# Patient Record
Sex: Female | Born: 1960
Health system: Southern US, Community
[De-identification: ages and names within clinical notes are randomized; demographics above are authoritative.]

## PROBLEM LIST (undated history)

## (undated) DIAGNOSIS — C801 Malignant (primary) neoplasm, unspecified: Secondary | ICD-10-CM

## (undated) DIAGNOSIS — I10 Essential (primary) hypertension: Secondary | ICD-10-CM

## (undated) DIAGNOSIS — T4145XA Adverse effect of unspecified anesthetic, initial encounter: Secondary | ICD-10-CM

## (undated) DIAGNOSIS — R112 Nausea with vomiting, unspecified: Secondary | ICD-10-CM

## (undated) DIAGNOSIS — Z86010 Personal history of colonic polyps: Secondary | ICD-10-CM

## (undated) DIAGNOSIS — E785 Hyperlipidemia, unspecified: Secondary | ICD-10-CM

## (undated) DIAGNOSIS — T7840XA Allergy, unspecified, initial encounter: Secondary | ICD-10-CM

## (undated) DIAGNOSIS — K589 Irritable bowel syndrome without diarrhea: Secondary | ICD-10-CM

## (undated) DIAGNOSIS — F419 Anxiety disorder, unspecified: Secondary | ICD-10-CM

## (undated) DIAGNOSIS — K219 Gastro-esophageal reflux disease without esophagitis: Secondary | ICD-10-CM

## (undated) DIAGNOSIS — C50919 Malignant neoplasm of unspecified site of unspecified female breast: Secondary | ICD-10-CM

## (undated) HISTORY — PX: WISDOM TOOTH EXTRACTION: SHX21

## (undated) HISTORY — DX: Malignant neoplasm of unspecified site of unspecified female breast: C50.919

## (undated) HISTORY — PX: COLONOSCOPY: SHX174

## (undated) HISTORY — DX: Essential (primary) hypertension: I10

## (undated) HISTORY — DX: Irritable bowel syndrome, unspecified: K58.9

## (undated) HISTORY — PX: OTHER SURGICAL HISTORY: SHX169

## (undated) HISTORY — DX: Personal history of colonic polyps: Z86.010

## (undated) HISTORY — DX: Allergy, unspecified, initial encounter: T78.40XA

## (undated) HISTORY — DX: Hyperlipidemia, unspecified: E78.5

## (undated) HISTORY — PX: DIAGNOSTIC LAPAROSCOPY: SUR761

## (undated) HISTORY — PX: BREAST LUMPECTOMY: SHX2

## (undated) HISTORY — DX: Malignant (primary) neoplasm, unspecified: C80.1

## (undated) HISTORY — PX: POLYPECTOMY: SHX149

---
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
## 1984-07-14 HISTORY — PX: BUNIONECTOMY: SHX129

## 1996-07-14 HISTORY — PX: OTHER SURGICAL HISTORY: SHX169

## 1998-07-14 HISTORY — PX: BIOPSY BREAST: PRO8

## 1999-04-15 ENCOUNTER — Ambulatory Visit (HOSPITAL_BASED_OUTPATIENT_CLINIC_OR_DEPARTMENT_OTHER): Admission: RE | Admit: 1999-04-15 | Discharge: 1999-04-15 | Payer: Self-pay | Admitting: *Deleted

## 1999-10-30 ENCOUNTER — Other Ambulatory Visit: Admission: RE | Admit: 1999-10-30 | Discharge: 1999-10-30 | Payer: Self-pay | Admitting: Obstetrics and Gynecology

## 2000-12-28 ENCOUNTER — Other Ambulatory Visit: Admission: RE | Admit: 2000-12-28 | Discharge: 2000-12-28 | Payer: Self-pay | Admitting: Obstetrics and Gynecology

## 2002-02-02 ENCOUNTER — Other Ambulatory Visit: Admission: RE | Admit: 2002-02-02 | Discharge: 2002-02-02 | Payer: Self-pay | Admitting: Obstetrics and Gynecology

## 2003-02-21 ENCOUNTER — Other Ambulatory Visit: Admission: RE | Admit: 2003-02-21 | Discharge: 2003-02-21 | Payer: Self-pay | Admitting: Obstetrics and Gynecology

## 2003-05-17 ENCOUNTER — Ambulatory Visit (HOSPITAL_COMMUNITY): Admission: RE | Admit: 2003-05-17 | Discharge: 2003-05-17 | Payer: Self-pay | Admitting: Family Medicine

## 2003-05-24 ENCOUNTER — Encounter: Admission: RE | Admit: 2003-05-24 | Discharge: 2003-05-24 | Payer: Self-pay | Admitting: Family Medicine

## 2003-07-15 HISTORY — PX: PARATHYROIDECTOMY: SHX19

## 2004-04-05 ENCOUNTER — Other Ambulatory Visit: Admission: RE | Admit: 2004-04-05 | Discharge: 2004-04-05 | Payer: Self-pay | Admitting: Obstetrics and Gynecology

## 2005-04-28 ENCOUNTER — Other Ambulatory Visit: Admission: RE | Admit: 2005-04-28 | Discharge: 2005-04-28 | Payer: Self-pay | Admitting: Obstetrics and Gynecology

## 2005-11-13 ENCOUNTER — Ambulatory Visit: Payer: Self-pay | Admitting: Internal Medicine

## 2005-11-17 ENCOUNTER — Ambulatory Visit: Payer: Self-pay | Admitting: Internal Medicine

## 2005-11-17 ENCOUNTER — Encounter (INDEPENDENT_AMBULATORY_CARE_PROVIDER_SITE_OTHER): Payer: Self-pay | Admitting: *Deleted

## 2005-11-17 DIAGNOSIS — Z860100 Personal history of colon polyps, unspecified: Secondary | ICD-10-CM | POA: Insufficient documentation

## 2005-11-17 HISTORY — DX: Personal history of colonic polyps: Z86.010

## 2005-11-17 HISTORY — DX: Personal history of colon polyps, unspecified: Z86.0100

## 2009-11-19 ENCOUNTER — Ambulatory Visit (HOSPITAL_BASED_OUTPATIENT_CLINIC_OR_DEPARTMENT_OTHER): Admission: RE | Admit: 2009-11-19 | Discharge: 2009-11-19 | Payer: Self-pay | Admitting: General Surgery

## 2010-07-14 HISTORY — PX: BREAST BIOPSY: SHX20

## 2010-10-01 LAB — DIFFERENTIAL
Basophils Absolute: 0.1 10*3/uL (ref 0.0–0.1)
Basophils Relative: 1 % (ref 0–1)
Eosinophils Absolute: 0.2 10*3/uL (ref 0.0–0.7)
Eosinophils Relative: 2 % (ref 0–5)
Lymphocytes Relative: 24 % (ref 12–46)
Lymphs Abs: 1.9 10*3/uL (ref 0.7–4.0)
Monocytes Absolute: 0.5 10*3/uL (ref 0.1–1.0)
Monocytes Relative: 6 % (ref 3–12)
Neutro Abs: 5.5 10*3/uL (ref 1.7–7.7)
Neutrophils Relative %: 68 % (ref 43–77)

## 2010-10-01 LAB — CBC
HCT: 41.3 % (ref 36.0–46.0)
Hemoglobin: 14.4 g/dL (ref 12.0–15.0)
MCHC: 34.8 g/dL (ref 30.0–36.0)
MCV: 87.6 fL (ref 78.0–100.0)
Platelets: 232 10*3/uL (ref 150–400)
RBC: 4.71 MIL/uL (ref 3.87–5.11)
RDW: 12.9 % (ref 11.5–15.5)
WBC: 8.2 10*3/uL (ref 4.0–10.5)

## 2010-10-01 LAB — BASIC METABOLIC PANEL
BUN: 13 mg/dL (ref 6–23)
CO2: 30 mEq/L (ref 19–32)
Calcium: 9.7 mg/dL (ref 8.4–10.5)
Chloride: 97 mEq/L (ref 96–112)
Creatinine, Ser: 0.73 mg/dL (ref 0.4–1.2)
GFR calc Af Amer: >60 mL/min (ref >60)
GFR calc non Af Amer: >60 mL/min (ref >60)
Glucose, Bld: 140 mg/dL — ABNORMAL HIGH (ref 70–99)
Potassium: 3.3 mEq/L — ABNORMAL LOW (ref 3.5–5.1)
Sodium: 134 mEq/L — ABNORMAL LOW (ref 135–145)

## 2010-11-11 ENCOUNTER — Encounter (INDEPENDENT_AMBULATORY_CARE_PROVIDER_SITE_OTHER): Payer: Self-pay | Admitting: General Surgery

## 2011-06-03 ENCOUNTER — Encounter (INDEPENDENT_AMBULATORY_CARE_PROVIDER_SITE_OTHER): Payer: Self-pay | Admitting: General Surgery

## 2011-06-04 ENCOUNTER — Encounter (INDEPENDENT_AMBULATORY_CARE_PROVIDER_SITE_OTHER): Payer: Self-pay | Admitting: General Surgery

## 2011-07-04 ENCOUNTER — Ambulatory Visit (INDEPENDENT_AMBULATORY_CARE_PROVIDER_SITE_OTHER): Payer: BC Managed Care – PPO | Admitting: General Surgery

## 2011-07-04 ENCOUNTER — Encounter (INDEPENDENT_AMBULATORY_CARE_PROVIDER_SITE_OTHER): Payer: Self-pay | Admitting: General Surgery

## 2011-07-04 VITALS — BP 126/80 | HR 60 | Temp 97.3°F | Resp 18 | Ht 66.0 in | Wt 169.8 lb

## 2011-07-04 DIAGNOSIS — N6019 Diffuse cystic mastopathy of unspecified breast: Secondary | ICD-10-CM

## 2011-07-04 DIAGNOSIS — N6459 Other signs and symptoms in breast: Secondary | ICD-10-CM

## 2011-07-04 DIAGNOSIS — N62 Hypertrophy of breast: Secondary | ICD-10-CM

## 2011-07-04 DIAGNOSIS — N6099 Unspecified benign mammary dysplasia of unspecified breast: Secondary | ICD-10-CM

## 2011-07-04 NOTE — Progress Notes (Signed)
Patient ID: Christine Francis, female   DOB: 1961/04/25, 50 y.o.   MRN: [id number] BP 126/80  Pulse 60  Temp(Src) 97.3 F (36.3 C) (Temporal)  Resp 18  Ht 5\' 6"  (1.676 m)  Wt 169 lb 12.8 oz (77.021 kg)  BMI 27.41 kg/m2 Ms. Agan returns now proximally 7 months following her biopsy that was described as atypical ductal hyperplasia and a radial scar lobular carcinoma in situ not actually fragment malignancy and she had been on estrogen replacement for many years this all occurred in an area close to where Dr. Luberta Robertson had removed some fibrocystic changes 10 years earlier and I recommended that one week death was stopped the estrogen replacement and just follow her closely. She has had a followup mammogram [facility] and read by Dr. Yolanda Bonine and no areas of questionable findings were noted and on physical examination today she has an area of absence of breast tissue in the right breast upper area were both of these biopsies have been performed but no worrisome findings clinically or radiologically she said that would stop in the estrogen replacement she's not had any hot flashes still having periods are irregular basis and is doing nicely. She is followed by Dr. Marcelle Overlie for her GYN care and if any areas occur clinically or radiologically I would recommend a she will return to see Dr. Dwain Sarna. The [facility] was offered earlier and she didnot want to persue it. The right breast at present he has a normal exam with the absence of breast tissue under the 2 continuous incisions were the 2 previous biopsies have been performed but no other masses tenderness or areas of concern one in the right breast tissue or left breast fissure. There is no lymph nodes palpable in E. the axilla and her recent mammogram [facility] is BI-RADS 2 and a repeat mammogram has been recommended in one year

## 2011-07-04 NOTE — Patient Instructions (Signed)
Self breast examinations monthly and followup with Dr. Marcelle Overlie as he had scheduled. Yearly mammograms and she noticed a change in only her breast examination call to be seen by Dr. Marcelle Overlie one of my partners.

## 2011-12-02 ENCOUNTER — Other Ambulatory Visit: Payer: Self-pay | Admitting: Obstetrics and Gynecology

## 2012-10-18 ENCOUNTER — Encounter: Payer: Self-pay | Admitting: Internal Medicine

## 2012-11-15 ENCOUNTER — Encounter: Payer: Self-pay | Admitting: Internal Medicine

## 2012-11-15 ENCOUNTER — Ambulatory Visit (AMBULATORY_SURGERY_CENTER): Payer: BC Managed Care – PPO | Admitting: *Deleted

## 2012-11-15 VITALS — Ht 66.0 in | Wt 164.8 lb

## 2012-11-15 DIAGNOSIS — Z1211 Encounter for screening for malignant neoplasm of colon: Secondary | ICD-10-CM

## 2012-11-15 MED ORDER — NA SULFATE-K SULFATE-MG SULF 17.5-3.13-1.6 GM/177ML PO SOLN: ORAL | Status: DC

## 2012-11-29 ENCOUNTER — Encounter: Payer: Self-pay | Admitting: Internal Medicine

## 2012-11-29 ENCOUNTER — Ambulatory Visit (AMBULATORY_SURGERY_CENTER): Payer: BC Managed Care – PPO | Admitting: Internal Medicine

## 2012-11-29 VITALS — BP 105/59 | HR 56 | Temp 98.6°F | Resp 20 | Ht 66.0 in | Wt 164.0 lb

## 2012-11-29 DIAGNOSIS — D126 Benign neoplasm of colon, unspecified: Secondary | ICD-10-CM

## 2012-11-29 DIAGNOSIS — Z1211 Encounter for screening for malignant neoplasm of colon: Secondary | ICD-10-CM

## 2012-11-29 DIAGNOSIS — Z860100 Personal history of colon polyps, unspecified: Secondary | ICD-10-CM

## 2012-11-29 MED ORDER — SODIUM CHLORIDE 0.9 % IV SOLN: 500.0000 mL | INTRAVENOUS | Status: DC

## 2012-11-29 NOTE — Progress Notes (Signed)
NO EGG OR SOY ALLERGY. EWM 

## 2012-11-29 NOTE — Patient Instructions (Addendum)
YOU HAD AN ENDOSCOPIC PROCEDURE TODAY AT THE [facility]: Refer to the procedure report that was given to you for any specific questions about what was found during the examination.  If the procedure report does not answer your questions, please call your gastroenterologist to clarify.  If you requested that your care partner not be given the details of your procedure findings, then the procedure report has been included in a sealed envelope for you to review at your convenience later.  YOU SHOULD EXPECT: Some feelings of bloating in the abdomen. Passage of more gas than usual.  Walking can help get rid of the air that was put into your GI tract during the procedure and reduce the bloating. If you had a lower endoscopy (such as a colonoscopy or flexible sigmoidoscopy) you may notice spotting of blood in your stool or on the toilet paper. If you underwent a bowel prep for your procedure, then you may not have a normal bowel movement for a few days.  DIET: Your first meal following the procedure should be a light meal and then it is ok to progress to your normal diet.  A half-sandwich or bowl of soup is an example of a good first meal.  Heavy or fried foods are harder to digest and may make you feel nauseous or bloated.  Likewise meals heavy in dairy and vegetables can cause extra gas to form and this can also increase the bloating.  Drink plenty of fluids but you should avoid alcoholic beverages for 24 hours.  ACTIVITY: Your care partner should take you home directly after the procedure.  You should plan to take it easy, moving slowly for the rest of the day.  You can resume normal activity the day after the procedure however you should NOT DRIVE or use heavy machinery for 24 hours (because of the sedation medicines used during the test).    SYMPTOMS TO REPORT IMMEDIATELY: A gastroenterologist can be reached at any hour.  During normal business hours, 8:30 AM to 5:00 PM Monday through Friday,  call [phone].  After hours and on weekends, please call the GI answering service at [phone] who will take a message and have the physician on call contact you.   Following lower endoscopy (colonoscopy or flexible sigmoidoscopy):  Excessive amounts of blood in the stool  Significant tenderness or worsening of abdominal pains  Swelling of the abdomen that is new, acute  Fever of 100F or higher  FOLLOW UP: If any biopsies were taken you will be contacted by phone or by letter within the next 1-3 weeks.  Call your gastroenterologist if you have not heard about the biopsies in 3 weeks.  Our staff will call the home number listed on your records the next business day following your procedure to check on you and address any questions or concerns that you may have at that time regarding the information given to you following your procedure. This is a courtesy call and so if there is no answer at the home number and we have not heard from you through the emergency physician on call, we will assume that you have returned to your regular daily activities without incident.  SIGNATURES/CONFIDENTIALITY: You and/or your care partner have signed paperwork which will be entered into your electronic medical record.  These signatures attest to the fact that that the information above on your After Visit Summary has been reviewed and is understood.  Full responsibility of the confidentiality of this   discharge information lies with you and/or your care-partner.  Polyps-handout given  Repeat colonoscopy will be determined by pathology   

## 2012-11-29 NOTE — Progress Notes (Signed)
Called to room to assist during endoscopic procedure.  Patient ID and intended procedure confirmed with present staff. Received instructions for my participation in the procedure from the performing physician.  

## 2012-11-29 NOTE — Progress Notes (Signed)
Patient did not experience any of the following events: a burn prior to discharge; a fall within the facility; wrong site/side/patient/procedure/implant event; or a hospital transfer or hospital admission upon discharge from the facility. (G8907) Patient did not have preoperative order for IV antibiotic SSI prophylaxis. (G8918)  

## 2012-11-29 NOTE — Op Note (Signed)
[facility] [address] [place], [address]   COLONOSCOPY PROCEDURE REPORT  PATIENT: Christine Francis, Christine Francis  MR#: [id number] BIRTHDATE: [birth year] , 51  yrs. old GENDER: Female ENDOSCOPIST: Iva Boop, MD, [facility] PROCEDURE DATE:  11/29/2012 PROCEDURE:   Colonoscopy with snare polypectomy ASA CLASS:   Class II INDICATIONS:Screening and surveillance,personal history of colonic polyps.   Last colonoscopy 2007 MEDICATIONS: propofol (Diprivan) 400mg  IV, MAC sedation, administered by CRNA, and These medications were titrated to patient response per physician's verbal order  DESCRIPTION OF PROCEDURE:   After the risks benefits and alternatives of the procedure were thoroughly explained, informed consent was obtained.  A digital rectal exam revealed no abnormalities of the rectum.   The LB [id number]  endoscope was introduced through the anus and advanced to the cecum, which was identified by both the appendix and ileocecal valve. No adverse events experienced.   The quality of the prep was excellent using Suprep  The instrument was then slowly withdrawn as the colon was fully examined.      COLON FINDINGS: Three diminutive sessile polyps were found in the ascending colon and transverse colon.  A polypectomy was performed with a cold snare.  The resection was complete and the polyp tissue was completely retrieved.   The colon mucosa was otherwise normal. A right colon retroflexion was performed.  Retroflexed views revealed no abnormalities. The time to cecum=2 minutes 37 seconds. Withdrawal time=10 minutes 03 seconds.  The scope was withdrawn and the procedure completed. COMPLICATIONS: There were no complications.  ENDOSCOPIC IMPRESSION: 1.   Three diminutive sessile polyps were found in the ascending colon and transverse colon; polypectomy was performed with a cold snare 2.   The colon mucosa was otherwise normal - excellent prep  RECOMMENDATIONS: 1.   Timing of repeat colonoscopy will be determined by pathology findings. 2.   In patient with 3 adenomas max 12 mm TV adenoma removed 2007   eSigned:  Iva Boop, MD, [facility] 11/29/2012 10:42 AM  cc: [facility] and The Patient

## 2012-11-30 ENCOUNTER — Telehealth: Payer: Self-pay | Admitting: *Deleted

## 2012-11-30 NOTE — Telephone Encounter (Signed)
  Follow up Call-  Call back number 11/29/2012  Post procedure Call Back phone  # [phone]  Permission to leave phone message Yes     Patient questions:  Do you have a fever, pain , or abdominal swelling? no Pain Score  0 *  Have you tolerated food without any problems? yes  Have you been able to return to your normal activities? yes  Do you have any questions about your discharge instructions: Diet   no Medications  no Follow up visit  no  Do you have questions or concerns about your Care? no  Actions: * If pain score is 4 or above: No action needed, pain <4.

## 2012-12-02 ENCOUNTER — Encounter: Payer: Self-pay | Admitting: Internal Medicine

## 2012-12-02 NOTE — Progress Notes (Signed)
Quick Note:  3 diminutive adenomas Repeat colonoscopy about 11/2015 ______

## 2013-11-29 ENCOUNTER — Other Ambulatory Visit: Payer: Self-pay | Admitting: Obstetrics and Gynecology

## 2013-12-12 HISTORY — PX: OTHER SURGICAL HISTORY: SHX169

## 2014-05-22 ENCOUNTER — Other Ambulatory Visit: Payer: Self-pay | Admitting: *Deleted

## 2014-05-22 ENCOUNTER — Ambulatory Visit
Admission: RE | Admit: 2014-05-22 | Discharge: 2014-05-22 | Disposition: A | Discharge status: Home / Self Care | Payer: BC Managed Care – PPO | Source: Ambulatory Visit | Attending: *Deleted | Admitting: *Deleted

## 2014-05-22 DIAGNOSIS — W19XXXA Unspecified fall, initial encounter: Secondary | ICD-10-CM

## 2014-06-20 ENCOUNTER — Other Ambulatory Visit: Payer: Self-pay | Admitting: Obstetrics and Gynecology

## 2014-06-21 LAB — CYTOLOGY - PAP

## 2014-07-19 ENCOUNTER — Other Ambulatory Visit: Payer: Self-pay | Admitting: Radiology

## 2014-07-24 ENCOUNTER — Other Ambulatory Visit (INDEPENDENT_AMBULATORY_CARE_PROVIDER_SITE_OTHER): Payer: Self-pay | Admitting: Surgery

## 2014-07-24 DIAGNOSIS — D0501 Lobular carcinoma in situ of right breast: Secondary | ICD-10-CM

## 2014-08-03 ENCOUNTER — Ambulatory Visit
Admission: RE | Admit: 2014-08-03 | Discharge: 2014-08-03 | Disposition: A | Discharge status: Home / Self Care | Payer: BLUE CROSS/BLUE SHIELD | Source: Ambulatory Visit | Attending: Surgery | Admitting: Surgery

## 2014-08-03 DIAGNOSIS — D0501 Lobular carcinoma in situ of right breast: Secondary | ICD-10-CM

## 2014-08-04 ENCOUNTER — Inpatient Hospital Stay: Admission: RE | Admit: 2014-08-04 | Payer: Self-pay | Source: Ambulatory Visit

## 2014-08-06 ENCOUNTER — Other Ambulatory Visit: Payer: Self-pay

## 2014-08-09 ENCOUNTER — Other Ambulatory Visit (INDEPENDENT_AMBULATORY_CARE_PROVIDER_SITE_OTHER): Payer: Self-pay | Admitting: Surgery

## 2014-08-09 DIAGNOSIS — D0501 Lobular carcinoma in situ of right breast: Secondary | ICD-10-CM

## 2014-08-14 ENCOUNTER — Telehealth: Payer: Self-pay | Admitting: *Deleted

## 2014-08-14 NOTE — Telephone Encounter (Signed)
Called & left a message for the pt to return my call so I can schedule her for a high risk appt.

## 2014-08-17 ENCOUNTER — Telehealth: Payer: Self-pay | Admitting: *Deleted

## 2014-08-17 NOTE — Telephone Encounter (Signed)
Pt returned my call and I confirmed 09/04/14 high risk appt w/ pt.  Mailed calendar, welcoming packet & intake form to pt.  Emailed Engineer, civil (consulting) at [employer] to make her aware.

## 2014-09-04 ENCOUNTER — Ambulatory Visit (HOSPITAL_BASED_OUTPATIENT_CLINIC_OR_DEPARTMENT_OTHER): Payer: BLUE CROSS/BLUE SHIELD | Admitting: Hematology and Oncology

## 2014-09-04 ENCOUNTER — Encounter: Payer: Self-pay | Admitting: Hematology and Oncology

## 2014-09-04 ENCOUNTER — Encounter (INDEPENDENT_AMBULATORY_CARE_PROVIDER_SITE_OTHER): Payer: Self-pay

## 2014-09-04 ENCOUNTER — Ambulatory Visit (HOSPITAL_BASED_OUTPATIENT_CLINIC_OR_DEPARTMENT_OTHER): Payer: BLUE CROSS/BLUE SHIELD

## 2014-09-04 VITALS — BP 116/61 | HR 57 | Temp 98.2°F | Resp 18 | Ht 66.0 in

## 2014-09-04 DIAGNOSIS — D0501 Lobular carcinoma in situ of right breast: Secondary | ICD-10-CM

## 2014-09-04 DIAGNOSIS — C50412 Malignant neoplasm of upper-outer quadrant of left female breast: Secondary | ICD-10-CM | POA: Insufficient documentation

## 2014-09-04 NOTE — Assessment & Plan Note (Addendum)
Right breast LCIS along with fibrocystic and columnar cell changes status post right breast biopsy 07/19/2014  Pathology counseling: I discussed with her the difference between LCIS and invasive cancer. LCIS is a risk factor for breast cancer and not a premalignant condition. Hence it does not need to be resected. However she is at higher than normal risk of breast cancer. Risk of invasive and noninvasive breast cancer with LCIS is 1% per year. Her cumulative risk lifetime would be around 40%. Tamoxifen would reduce this risk by half. This is based on NSABP P-1 clinical trial  Risk reduction strategies: 1. Tamoxifen or raloxifene are indicated to decrease the risk of another noninvasive or invasive breast cancer. Patient fully understands that neither of these medications would prolong her life but certainly help decrease relapse or recurrence rate by 50% . 2. Recommended annual mammograms and breast exams.   Tamoxifen toxicities:We discussed the risks and benefits of tamoxifen. These include but not limited to insomnia, hot flashes, mood changes, vaginal dryness, and weight gain. Although rare, serious side effects including endometrial cancer, risk of blood clots were also discussed. We strongly believe that the benefits far outweigh the risks. Patient understands these risks and consented to starting treatment. Planned treatment duration is 5 years.

## 2014-09-04 NOTE — Progress Notes (Signed)
Checked in new patient with no issues prior to seeing the dr. She has appt card and has not been traveling.

## 2014-09-04 NOTE — Progress Notes (Signed)
[facility] CONSULT NOTE  Patient Care Team: Margarette Asal, MD as PCP - General (Obstetrics and Gynecology)  CHIEF COMPLAINTS/PURPOSE OF CONSULTATION:  Right breast LCIS  HISTORY OF PRESENTING ILLNESS:  Christine Francis 54 y.o. female is here because of recent diagnosis of right breast LCIS. Patient had 3 prior biopsies including a lumpectomy on 11/19/2009 which showed atypical ductal hyperplasia. Patient has had a long-standing history of oral contraceptive treatment for over 30 years most recently being used for dysfunctional uterine bleeding. After the previous breast problems, she discontinued oral contraceptive pills. She had a mammogram which revealed abnormalities that led to a biopsy on 07/19/2014 which revealed LCIS. She met with Dr. Molli Posey who referred her to [place] to discuss risk lowering strategies because she is high risk for breast cancer. She is here today accompanied by her sister who works as a Marine scientist in [place]. Patient has been in significant emotional distress ever since he was found to have this diagnosis and wanted to discuss different options for treatment including the role of prophylactic mastectomy. She was also being evaluated for atypical Pap smears which are being watched and monitored.  I reviewed her records extensively and collaborated the history with the patient.  SUMMARY OF ONCOLOGIC HISTORY:   Neoplasm of right breast, primary tumor staging category Tis: lobular carcinoma in situ (LCIS)   07/19/2014 Initial Biopsy Rt.Breast Biopsy: LCIS    In terms of breast cancer risk profile:  She menarched at early age of 84 and went to menopause at age 61  She had 0 pregnancies  She has received birth control pills for approximately 30 years.  She was never exposed to fertility medications or hormone replacement therapy.  She has no family history of Breast/GYN/GI cancer  MEDICAL HISTORY:  Past Medical History  Diagnosis Date  . Asthma   . IBS  (irritable bowel syndrome)   . Hyperlipidemia   . Hypertension   . Cancer     skin cancer  . Personal history of colonic adenomas 11/17/2005    11/17/2005 - 3 adenomas - one  A TV adenoma was 12 mm (max)    SURGICAL HISTORY: Past Surgical History  Procedure Laterality Date  . Parathyroidectomy  2005  . Biopsy breast  2000    right; benign  . Laproscopy  1998  . Bunionectomy  1986    bilateral  . Breast biopsy  2012    right breast; pre cancerous  . Colonoscopy    . Cervical cryosurgery  12/2013  . Breast lumpectomy  04/1999, 11/2009    SOCIAL HISTORY: History   Social History  . Marital Status: Single    Spouse Name: N/A  . Number of Children: N/A  . Years of Education: N/A   Occupational History  . Loss adjuster, chartered  .  [employer]   Social History Main Topics  . Smoking status: Never Smoker   . Smokeless tobacco: Never Used  . Alcohol Use: 0.6 oz/week    1 Glasses of wine per week  . Drug Use: No  . Sexual Activity: Not on file   Other Topics Concern  . Not on file   Social History Narrative    FAMILY HISTORY: Family History  Problem Relation Age of Onset  . Hypertension Father   . Cancer Father     melanoma/skin cancer  . Cancer Sister     skin  . Colon cancer Neg Hx     ALLERGIES:  is allergic to amoxicillin;  dilaudid; erythromycin; penicillins; and sulfa drugs cross reactors.  MEDICATIONS:  Current Outpatient Prescriptions  Medication Sig Dispense Refill  . atenolol-chlorthalidone (TENORETIC) 50-25 MG per tablet Take 1 tablet by mouth daily.      Marland Kitchen CALCIUM PO Take 1,200 mg by mouth.     . Cholecalciferol (VITAMIN D-3 PO) Take 1,600 Int'l Units by mouth.     . diazepam (VALIUM) 5 MG tablet Take 5 mg by mouth every 8 (eight) hours as needed. for anxiety  0  . montelukast (SINGULAIR) 10 MG tablet Take 10 mg by mouth at bedtime.    . Omeprazole (PRILOSEC PO) Take 20 mg by mouth daily.     . Potassium Chloride Crys CR (KLOR-CON M20 PO) Take  20 mEq by mouth.      . predniSONE (DELTASONE) 10 MG tablet Take 10 mg by mouth daily.     Marland Kitchen triamcinolone cream (KENALOG) 0.1 % Apply 1 application topically 2 (two) times daily.      No current facility-administered medications for this visit.    REVIEW OF SYSTEMS:   Constitutional: Denies fevers, chills or abnormal night sweats Eyes: Denies blurriness of vision, double vision or watery eyes Ears, nose, mouth, throat, and face: Denies mucositis or sore throat Respiratory: Denies cough, dyspnea or wheezes Cardiovascular: Denies palpitation, chest discomfort or lower extremity swelling Gastrointestinal:  Denies nausea, heartburn or change in bowel habits Skin: Denies abnormal skin rashes Lymphatics: Denies new lymphadenopathy or easy bruising Neurological:Denies numbness, tingling or new weaknesses Behavioral/Psych: Mood is stable, no new changes  Breast:  Denies any palpable lumps or discharge All other systems were reviewed with the patient and are negative.  PHYSICAL EXAMINATION: ECOG PERFORMANCE STATUS: 0 - Asymptomatic  Filed Vitals:   09/04/14 1213  BP: 116/61  Pulse: 57  Temp: 98.2 F (36.8 C)  Resp: 18   Filed Weights    GENERAL:alert, no distress and comfortable SKIN: skin color, texture, turgor are normal, no rashes or significant lesions EYES: normal, conjunctiva are pink and non-injected, sclera clear OROPHARYNX:no exudate, no erythema and lips, buccal mucosa, and tongue normal  NECK: supple, thyroid normal size, non-tender, without nodularity LYMPH:  no palpable lymphadenopathy in the cervical, axillary or inguinal LUNGS: clear to auscultation and percussion with normal breathing effort HEART: regular rate & rhythm and no murmurs and no lower extremity edema ABDOMEN:abdomen soft, non-tender and normal bowel sounds Musculoskeletal:no cyanosis of digits and no clubbing  PSYCH: alert & oriented x 3 with fluent speech NEURO: no focal motor/sensory  deficits  LABORATORY DATA:  I have reviewed the data as listed Lab Results  Component Value Date   WBC 8.2 11/14/2009   HGB 14.4 11/14/2009   HCT 41.3 11/14/2009   MCV 87.6 11/14/2009   PLT 232 11/14/2009   Lab Results  Component Value Date   NA 134* 11/14/2009   K 3.3* 11/14/2009   CL 97 11/14/2009   CO2 30 11/14/2009    ASSESSMENT AND PLAN:  Neoplasm of right breast, primary tumor staging category Tis: lobular carcinoma in situ (LCIS) Right breast LCIS along with fibrocystic and columnar cell changes status post right breast biopsy 07/19/2014 Patient was unable to do breast MRI because of claustrophobia.  Pathology counseling: I discussed with her the difference between LCIS and invasive cancer. LCIS is a risk factor for breast cancer and not a premalignant condition. Hence it does not need to be resected. However she is at higher than normal risk of breast cancer. Risk of invasive and  noninvasive breast cancer with LCIS is 1% per year. Her cumulative risk lifetime would be around 40%. Tamoxifen would reduce this risk by half. This is based on NSABP P-1 clinical trial  Risk reduction strategies: 1. Tamoxifen or raloxifene are indicated to decrease the risk of another noninvasive or invasive breast cancer. Patient fully understands that neither of these medications would prolong her life but certainly help decrease relapse or recurrence rate by 50% . 2. Recommended annual mammograms and breast exams.   Tamoxifen toxicities:We discussed the risks and benefits of tamoxifen. These include but not limited to insomnia, hot flashes, mood changes, vaginal dryness, and weight gain. Although rare, serious side effects including endometrial cancer, risk of blood clots were also discussed. We strongly believe that the benefits far outweigh the risks. Patient understands these risks and consented to starting treatment. Planned treatment duration is 5 years.  Patient is evaluating different  options and will call us with her final decision. She will discuss with Dr.Tsui regarding the role of prophylactic bilateral mastectomies. I did not think it was necessary but the patient believes that it could help her emotional state. She will be discussing the role of lumpectomy as well. Since she could not do the breast MRI, it is difficult to tell if there is still additional underlying breast abnormality. I will leave the decision regarding surgery to the patient and Dr.Tsui.  Patient will call us back if she wants to get started on tamoxifen therapy. If she does start tamoxifen I would like to see her back in 3 months.     All questions were answered. The patient knows to call the clinic with any problems, questions or concerns.    Rulon Eisenmenger, MD 1:36 PM

## 2014-09-04 NOTE — Progress Notes (Signed)
New patient intake form - chart updated.  Sent to scan.

## 2014-10-23 ENCOUNTER — Ambulatory Visit: Payer: Self-pay | Admitting: Surgery

## 2014-10-23 DIAGNOSIS — D0501 Lobular carcinoma in situ of right breast: Secondary | ICD-10-CM

## 2014-10-23 NOTE — H&P (Signed)
  History of Present Illness Christine Francis. [place] Souter MD; 10/23/2014 1:33 PM) Patient words: discuss lumpectomy.  The patient is a 54 year old female who presents with a complaint of Breast problems. This is a 54 yo female who presents after excision of a fibroadenoma in the upper outer quadrant of her right breast by Dr. Truitt Leep. In 2011, she underwent further surgery with a needle-localized lumpectomy in the right upper outer quadrant by Dr. Rise Patience. She has a large incision with some underlying tissue loss in the RUOQ. The lumpectomy pathology report showed atypical ductal hypdrplasia in a radial scar, lobular carcinoma in situ, and flat epithelial atypia.  On 06/28/14, she underwent routine screening mammogram. She had been followed closely with q6 month mammograms and this had been her first annual mammogram in several years. She had some asymmetry in the right breast. She was recalled for diagnostic mammogram and ultrasound on 07/04/14. The ultrasound showed a 1 cm oval mass in the right breast with well-circumscribed margins. This was biopsied under ultrasound guidance on 07/19/14 at 10:00 in the right breast anterior depth. The pathology report shows lobular neoplasia (LCIS). Initially the patient was leaning towards mastectomy. However she has had consultations with Dr. Lindi Adie of Oncology and Dr. Ernst Bowler of Plastic Surgery.  The patient was unable to tolerate MRI because of extreme claustrophobia. Dr. Lindi Adie explained to the patient that LCIS is a risk factor for breast cancer and not a premalignant condition. Her cumulative lifetime risk of developing invasive cancer in either breast would be around 40%. He recommended Tamoxifen to decrease her overall risk.  After thinking this over thoroughly, she has decided on lumpectomy followed by Tamoxifen. She comes in today to discuss the surgical procedure. Allergies (Sonya Bynum, CMA; 10/23/2014 10:37 AM) Penicillins Dilaudid *ANALGESICS -  OPIOID* Sulfa Antibiotics  Medication History (Sonya Bynum, CMA; 10/23/2014 10:38 AM) Atenolol-Chlorthalidone (50-25MG  Tablet, Oral) Active. Klor-Con M20  [facility] Tablet ER, Oral) Active. Montelukast Sodium (10MG  Tablet, Oral) Active. Calcium "900" w/D (Oral) Active. Cholecalciferol Active. PriLOSEC OTC (20MG  Tablet DR, Oral) Active. Medications Reconciled    Vitals (Sonya Bynum CMA; 10/23/2014 10:37 AM) 10/23/2014 10:37 AM Weight: 182 lb Height: 66in Body Surface Area: 1.96 m Body Mass Index: 29.38 kg/m Temp.: 97.2F(Temporal)  Pulse: 75 (Regular)  BP: 118/74 (Sitting, Left Arm, Standard)     Physical Exam Rodman Key K. Britiny Defrain MD; 10/23/2014 1:33 PM)  The physical exam findings are as follows: Note:Note:WDWN in NAD HEENT: EOMI, sclera anicteric Neck: No masses, no thyromegaly Lungs: CTA bilaterally; normal respiratory effort Breasts: right breast slightly smaller than left; large transverse incision across the upper outer quadrant of the right breast Some residual bruising of the RUOQ from the biopsy. No palpable masses in either breast No axillary lymphadenopathy CV: Regular rate and rhythm; no murmurs Abd: +bowel sounds, soft, non-tender, no masses Ext: Well-perfused; no edema Skin: Warm, dry; no sign of jaundice    Assessment & Plan Rodman Key K. Corrine Tillis MD; 10/23/2014 1:33 PM)  BREAST NEOPLASM, TIS (LCIS), RIGHT (233.0  D05.01)  Current Plans Schedule for Surgery - right seed-localized lumpectomy. The surgical procedure has been discussed with the patient. Potential risks, benefits, alternative treatments, and expected outcomes have been explained. All of the patient's questions at this time have been answered. The likelihood of reaching the patient's treatment goal is good. The patient understand the proposed surgical procedure and wishes to proceed.  Christine Francis. Georgette Dover, MD, [facility]  General/ Trauma Surgery  10/23/2014 1:36  PM

## 2014-11-24 ENCOUNTER — Encounter (HOSPITAL_BASED_OUTPATIENT_CLINIC_OR_DEPARTMENT_OTHER): Payer: Self-pay | Admitting: *Deleted

## 2014-11-28 ENCOUNTER — Encounter (HOSPITAL_BASED_OUTPATIENT_CLINIC_OR_DEPARTMENT_OTHER)
Admission: RE | Admit: 2014-11-28 | Discharge: 2014-11-28 | Disposition: A | Discharge status: Home / Self Care | Payer: BLUE CROSS/BLUE SHIELD | Source: Ambulatory Visit | Attending: Surgery | Admitting: Surgery

## 2014-11-28 DIAGNOSIS — Z79899 Other long term (current) drug therapy: Secondary | ICD-10-CM | POA: Diagnosis not present

## 2014-11-28 DIAGNOSIS — J45909 Unspecified asthma, uncomplicated: Secondary | ICD-10-CM | POA: Diagnosis not present

## 2014-11-28 DIAGNOSIS — I10 Essential (primary) hypertension: Secondary | ICD-10-CM | POA: Diagnosis not present

## 2014-11-28 DIAGNOSIS — R921 Mammographic calcification found on diagnostic imaging of breast: Secondary | ICD-10-CM | POA: Diagnosis not present

## 2014-11-28 DIAGNOSIS — D241 Benign neoplasm of right breast: Secondary | ICD-10-CM | POA: Diagnosis not present

## 2014-11-28 DIAGNOSIS — K219 Gastro-esophageal reflux disease without esophagitis: Secondary | ICD-10-CM | POA: Diagnosis not present

## 2014-11-28 DIAGNOSIS — D0501 Lobular carcinoma in situ of right breast: Secondary | ICD-10-CM | POA: Diagnosis present

## 2014-11-28 DIAGNOSIS — N62 Hypertrophy of breast: Secondary | ICD-10-CM | POA: Diagnosis not present

## 2014-11-28 LAB — BASIC METABOLIC PANEL
ANION GAP: 9 (ref 5–15)
BUN: 15 mg/dL (ref 6–20)
CHLORIDE: 100 mmol/L — AB (ref 101–111)
CO2: 29 mmol/L (ref 22–32)
Calcium: 9.9 mg/dL (ref 8.9–10.3)
Creatinine, Ser: 0.79 mg/dL (ref 0.44–1.00)
GFR calc Af Amer: >60 mL/min (ref >60)
Glucose, Bld: 119 mg/dL — ABNORMAL HIGH (ref 65–99)
Potassium: 3 mmol/L — ABNORMAL LOW (ref 3.5–5.1)
SODIUM: 138 mmol/L (ref 135–145)

## 2014-11-28 NOTE — Progress Notes (Addendum)
Spoke with Amy RN at Dr. Vonna Kotyk office to notify him of K 3.0, Cl 100 and glucose 119. Dr. Georgette Dover called back and said he was ok with it if anesthesia thinks ok. Dr. Rodman Comp shown these labs - [facility] for surgery.

## 2014-11-28 NOTE — Progress Notes (Signed)
Dr. Linna Caprice reviewed EKG - [facility] for surgery

## 2014-11-29 ENCOUNTER — Ambulatory Visit (HOSPITAL_BASED_OUTPATIENT_CLINIC_OR_DEPARTMENT_OTHER)
Admission: RE | Admit: 2014-11-29 | Discharge: 2014-11-29 | Disposition: A | Discharge status: Home / Self Care | Payer: BLUE CROSS/BLUE SHIELD | Source: Ambulatory Visit | Attending: Surgery | Admitting: Surgery

## 2014-11-29 ENCOUNTER — Encounter (HOSPITAL_BASED_OUTPATIENT_CLINIC_OR_DEPARTMENT_OTHER)
Admission: RE | Disposition: A | Discharge status: Home / Self Care | Payer: Self-pay | Source: Ambulatory Visit | Attending: Surgery

## 2014-11-29 ENCOUNTER — Ambulatory Visit (HOSPITAL_BASED_OUTPATIENT_CLINIC_OR_DEPARTMENT_OTHER): Payer: BLUE CROSS/BLUE SHIELD | Admitting: Anesthesiology

## 2014-11-29 ENCOUNTER — Encounter (HOSPITAL_BASED_OUTPATIENT_CLINIC_OR_DEPARTMENT_OTHER): Payer: Self-pay

## 2014-11-29 DIAGNOSIS — I10 Essential (primary) hypertension: Secondary | ICD-10-CM | POA: Insufficient documentation

## 2014-11-29 DIAGNOSIS — K219 Gastro-esophageal reflux disease without esophagitis: Secondary | ICD-10-CM | POA: Insufficient documentation

## 2014-11-29 DIAGNOSIS — D0501 Lobular carcinoma in situ of right breast: Secondary | ICD-10-CM | POA: Insufficient documentation

## 2014-11-29 DIAGNOSIS — Z79899 Other long term (current) drug therapy: Secondary | ICD-10-CM | POA: Insufficient documentation

## 2014-11-29 DIAGNOSIS — R921 Mammographic calcification found on diagnostic imaging of breast: Secondary | ICD-10-CM | POA: Insufficient documentation

## 2014-11-29 DIAGNOSIS — D241 Benign neoplasm of right breast: Secondary | ICD-10-CM | POA: Insufficient documentation

## 2014-11-29 DIAGNOSIS — J45909 Unspecified asthma, uncomplicated: Secondary | ICD-10-CM | POA: Insufficient documentation

## 2014-11-29 DIAGNOSIS — N62 Hypertrophy of breast: Secondary | ICD-10-CM | POA: Insufficient documentation

## 2014-11-29 HISTORY — DX: Gastro-esophageal reflux disease without esophagitis: K21.9

## 2014-11-29 HISTORY — DX: Anxiety disorder, unspecified: F41.9

## 2014-11-29 HISTORY — PX: BREAST LUMPECTOMY WITH RADIOACTIVE SEED LOCALIZATION: SHX6424

## 2014-11-29 LAB — POCT HEMOGLOBIN-HEMACUE: Hemoglobin: 14.7 g/dL (ref 12.0–15.0)

## 2014-11-29 SURGERY — BREAST LUMPECTOMY WITH RADIOACTIVE SEED LOCALIZATION
Anesthesia: General | Site: Breast | Laterality: Right

## 2014-11-29 MED ORDER — FENTANYL CITRATE (PF) 100 MCG/2ML IJ SOLN
INTRAMUSCULAR | Status: DC | PRN
  Administered 2014-11-29 (×2): 25 ug via INTRAVENOUS
  Administered 2014-11-29: 50 ug via INTRAVENOUS

## 2014-11-29 MED ORDER — LACTATED RINGERS IV SOLN
INTRAVENOUS | Status: DC
  Administered 2014-11-29: 09:00:00 via INTRAVENOUS

## 2014-11-29 MED ORDER — PROPOFOL 10 MG/ML IV BOLUS
INTRAVENOUS | Status: DC | PRN
  Administered 2014-11-29: 250 mg via INTRAVENOUS

## 2014-11-29 MED ORDER — BUPIVACAINE-EPINEPHRINE 0.25% -1:200000 IJ SOLN
INTRAMUSCULAR | Status: DC | PRN
  Administered 2014-11-29: 10 mL

## 2014-11-29 MED ORDER — CEFAZOLIN SODIUM-DEXTROSE 2-3 GM-% IV SOLR
INTRAVENOUS | Status: AC
  Filled 2014-11-29: qty 50

## 2014-11-29 MED ORDER — MIDAZOLAM HCL 2 MG/2ML IJ SOLN
INTRAMUSCULAR | Status: AC
  Filled 2014-11-29: qty 2

## 2014-11-29 MED ORDER — ONDANSETRON HCL 4 MG/2ML IJ SOLN: 4.0000 mg | INTRAMUSCULAR | Status: DC | PRN

## 2014-11-29 MED ORDER — HYDROCODONE-ACETAMINOPHEN 5-325 MG PO TABS: 1.0000 | ORAL_TABLET | ORAL | Status: DC | PRN

## 2014-11-29 MED ORDER — OXYCODONE HCL 5 MG/5ML PO SOLN
5.0000 mg | Freq: Once | ORAL | Status: AC | PRN
Start: 2014-11-29 — End: 2014-11-29

## 2014-11-29 MED ORDER — FENTANYL CITRATE (PF) 100 MCG/2ML IJ SOLN
INTRAMUSCULAR | Status: AC
  Filled 2014-11-29: qty 2

## 2014-11-29 MED ORDER — FENTANYL CITRATE (PF) 100 MCG/2ML IJ SOLN
INTRAMUSCULAR | Status: AC
  Filled 2014-11-29: qty 4

## 2014-11-29 MED ORDER — BUPIVACAINE-EPINEPHRINE (PF) 0.5% -1:200000 IJ SOLN
INTRAMUSCULAR | Status: AC
  Filled 2014-11-29: qty 30

## 2014-11-29 MED ORDER — CEFAZOLIN SODIUM-DEXTROSE 2-3 GM-% IV SOLR
2.0000 g | INTRAVENOUS | Status: AC
  Administered 2014-11-29: 2 g via INTRAVENOUS

## 2014-11-29 MED ORDER — OXYCODONE HCL 5 MG PO TABS
5.0000 mg | ORAL_TABLET | Freq: Once | ORAL | Status: AC | PRN
  Administered 2014-11-29: 5 mg via ORAL

## 2014-11-29 MED ORDER — OXYCODONE HCL 5 MG PO TABS
ORAL_TABLET | ORAL | Status: AC
  Filled 2014-11-29: qty 1

## 2014-11-29 MED ORDER — CHLORHEXIDINE GLUCONATE 4 % EX LIQD: 1.0000 "application " | Freq: Once | CUTANEOUS | Status: DC

## 2014-11-29 MED ORDER — LIDOCAINE HCL (CARDIAC) 20 MG/ML IV SOLN
INTRAVENOUS | Status: DC | PRN
  Administered 2014-11-29: 80 mg via INTRAVENOUS

## 2014-11-29 MED ORDER — PROPOFOL 500 MG/50ML IV EMUL
INTRAVENOUS | Status: AC
  Filled 2014-11-29: qty 50

## 2014-11-29 MED ORDER — MORPHINE SULFATE 2 MG/ML IJ SOLN: 2.0000 mg | INTRAMUSCULAR | Status: DC | PRN

## 2014-11-29 MED ORDER — BUPIVACAINE-EPINEPHRINE (PF) 0.25% -1:200000 IJ SOLN
INTRAMUSCULAR | Status: AC
  Filled 2014-11-29: qty 60

## 2014-11-29 MED ORDER — GLYCOPYRROLATE 0.2 MG/ML IJ SOLN: 0.2000 mg | Freq: Once | INTRAMUSCULAR | Status: DC | PRN

## 2014-11-29 MED ORDER — DEXAMETHASONE SODIUM PHOSPHATE 4 MG/ML IJ SOLN
INTRAMUSCULAR | Status: DC | PRN
  Administered 2014-11-29: 10 mg via INTRAVENOUS

## 2014-11-29 MED ORDER — HYDROMORPHONE HCL 1 MG/ML IJ SOLN: 0.2500 mg | INTRAMUSCULAR | Status: DC | PRN

## 2014-11-29 MED ORDER — FENTANYL CITRATE (PF) 100 MCG/2ML IJ SOLN: 50.0000 ug | INTRAMUSCULAR | Status: DC | PRN

## 2014-11-29 MED ORDER — MEPERIDINE HCL 25 MG/ML IJ SOLN: 6.2500 mg | INTRAMUSCULAR | Status: DC | PRN

## 2014-11-29 MED ORDER — FENTANYL CITRATE (PF) 100 MCG/2ML IJ SOLN
25.0000 ug | INTRAMUSCULAR | Status: DC | PRN
  Administered 2014-11-29 (×3): 25 ug via INTRAVENOUS

## 2014-11-29 MED ORDER — BUPIVACAINE HCL (PF) 0.5 % IJ SOLN
INTRAMUSCULAR | Status: AC
  Filled 2014-11-29: qty 30

## 2014-11-29 MED ORDER — MIDAZOLAM HCL 2 MG/2ML IJ SOLN
1.0000 mg | INTRAMUSCULAR | Status: DC | PRN
  Administered 2014-11-29 (×2): 1 mg via INTRAVENOUS

## 2014-11-29 SURGICAL SUPPLY — 47 items
APPLIER CLIP 9.375 MED OPEN (MISCELLANEOUS) ×2
BENZOIN TINCTURE PRP APPL 2/3 (GAUZE/BANDAGES/DRESSINGS) ×2 IMPLANT
BLADE HEX COATED 2.75 (ELECTRODE) ×2 IMPLANT
BLADE SURG 15 STRL LF DISP TIS (BLADE) ×1 IMPLANT
BLADE SURG 15 STRL SS (BLADE) ×1
CANISTER SUCT 1200ML W/VALVE (MISCELLANEOUS) ×2 IMPLANT
CHLORAPREP W/TINT 26ML (MISCELLANEOUS) ×2 IMPLANT
CLIP APPLIE 9.375 MED OPEN (MISCELLANEOUS) ×1 IMPLANT
COVER BACK TABLE 60X90IN (DRAPES) ×2 IMPLANT
COVER MAYO STAND STRL (DRAPES) ×2 IMPLANT
COVER PROBE W GEL 5X96 (DRAPES) ×2 IMPLANT
DECANTER SPIKE VIAL GLASS SM (MISCELLANEOUS) IMPLANT
DEVICE DUBIN W/COMP PLATE 8390 (MISCELLANEOUS) ×2 IMPLANT
DRAPE LAPAROTOMY 100X72 PEDS (DRAPES) ×2 IMPLANT
DRAPE UTILITY XL STRL (DRAPES) ×2 IMPLANT
DRSG TEGADERM 4X4.75 (GAUZE/BANDAGES/DRESSINGS) ×2 IMPLANT
ELECT REM PT RETURN 9FT ADLT (ELECTROSURGICAL) ×2
ELECTRODE REM PT RTRN 9FT ADLT (ELECTROSURGICAL) ×1 IMPLANT
GLOVE BIO SURGEON STRL SZ 6.5 (GLOVE) ×2 IMPLANT
GLOVE BIO SURGEON STRL SZ7 (GLOVE) ×2 IMPLANT
GLOVE BIOGEL PI IND STRL 7.0 (GLOVE) ×1 IMPLANT
GLOVE BIOGEL PI IND STRL 7.5 (GLOVE) ×1 IMPLANT
GLOVE BIOGEL PI INDICATOR 7.0 (GLOVE) ×1
GLOVE BIOGEL PI INDICATOR 7.5 (GLOVE) ×1
GOWN STRL REUS W/ TWL LRG LVL3 (GOWN DISPOSABLE) ×2 IMPLANT
GOWN STRL REUS W/TWL LRG LVL3 (GOWN DISPOSABLE) ×2
KIT MARKER MARGIN INK (KITS) ×2 IMPLANT
NEEDLE HYPO 25X1 1.5 SAFETY (NEEDLE) ×2 IMPLANT
NS IRRIG 1000ML POUR BTL (IV SOLUTION) ×2 IMPLANT
PACK BASIN DAY SURGERY FS (CUSTOM PROCEDURE TRAY) ×2 IMPLANT
PENCIL BUTTON HOLSTER BLD 10FT (ELECTRODE) ×2 IMPLANT
SLEEVE SCD COMPRESS KNEE MED (MISCELLANEOUS) ×2 IMPLANT
SPONGE GAUZE 2X2 8PLY STRL LF (GAUZE/BANDAGES/DRESSINGS) IMPLANT
SPONGE GAUZE 4X4 12PLY STER LF (GAUZE/BANDAGES/DRESSINGS) IMPLANT
SPONGE LAP 18X18 X RAY DECT (DISPOSABLE) IMPLANT
SPONGE LAP 4X18 X RAY DECT (DISPOSABLE) ×2 IMPLANT
STRIP CLOSURE SKIN 1/2X4 (GAUZE/BANDAGES/DRESSINGS) ×2 IMPLANT
SUT MON AB 4-0 PC3 18 (SUTURE) ×2 IMPLANT
SUT SILK 2 0 SH (SUTURE) IMPLANT
SUT VIC AB 3-0 SH 27 (SUTURE) ×1
SUT VIC AB 3-0 SH 27X BRD (SUTURE) ×1 IMPLANT
SYR BULB 3OZ (MISCELLANEOUS) IMPLANT
SYR CONTROL 10ML LL (SYRINGE) ×2 IMPLANT
TOWEL OR 17X24 6PK STRL BLUE (TOWEL DISPOSABLE) ×2 IMPLANT
TOWEL OR NON WOVEN STRL DISP B (DISPOSABLE) ×2 IMPLANT
TUBE CONNECTING 20X1/4 (TUBING) ×2 IMPLANT
YANKAUER SUCT BULB TIP NO VENT (SUCTIONS) ×2 IMPLANT

## 2014-11-29 NOTE — H&P (Signed)
  History of Present Illness  Patient words: discuss lumpectomy.  The patient is a 54 year old female who presents with a complaint of Breast problems. This is a 54 yo female who presents after excision of a fibroadenoma in the upper outer quadrant of her right breast by Dr. Truitt Leep. In 2011, she underwent further surgery with a needle-localized lumpectomy in the right upper outer quadrant by Dr. Rise Patience. She has a large incision with some underlying tissue loss in the RUOQ. The lumpectomy pathology report showed atypical ductal hypdrplasia in a radial scar, lobular carcinoma in situ, and flat epithelial atypia.  On 06/28/14, she underwent routine screening mammogram. She had been followed closely with q6 month mammograms and this had been her first annual mammogram in several years. She had some asymmetry in the right breast. She was recalled for diagnostic mammogram and ultrasound on 07/04/14. The ultrasound showed a 1 cm oval mass in the right breast with well-circumscribed margins. This was biopsied under ultrasound guidance on 07/19/14 at 10:00 in the right breast anterior depth. The pathology report shows lobular neoplasia (LCIS). Initially the patient was leaning towards mastectomy. However she has had consultations with Dr. Lindi Adie of Oncology and Dr. Ernst Bowler of Plastic Surgery.  The patient was unable to tolerate MRI because of extreme claustrophobia. Dr. Lindi Adie explained to the patient that LCIS is a risk factor for breast cancer and not a premalignant condition. Her cumulative lifetime risk of developing invasive cancer in either breast would be around 40%. He recommended Tamoxifen to decrease her overall risk.  After thinking this over thoroughly, she has decided on lumpectomy followed by Tamoxifen. She comes in today to discuss the surgical procedure. Allergies  Penicillins Dilaudid *ANALGESICS - OPIOID* Sulfa Antibiotics  Medication History  Atenolol-Chlorthalidone  (50-25MG  Tablet, Oral) Active. Klor-Con M20 [employer] Tablet ER, Oral) Active. Montelukast Sodium (10MG  Tablet, Oral) Active. Calcium "900" w/D (Oral) Active. Cholecalciferol Active. PriLOSEC OTC (20MG  Tablet DR, Oral) Active. Medications Reconciled    Vitals   Weight: 182 lb Height: 66in Body Surface Area: 1.96 m Body Mass Index: 29.38 kg/m Temp.: 97.76F(Temporal)  Pulse: 75 (Regular)  BP: 118/74 (Sitting, Left Arm, Standard)     Physical Exam   The physical exam findings are as follows: Note:Note:WDWN in NAD HEENT: EOMI, sclera anicteric Neck: No masses, no thyromegaly Lungs: CTA bilaterally; normal respiratory effort Breasts: right breast slightly smaller than left; large transverse incision across the upper outer quadrant of the right breast Some residual bruising of the RUOQ from the biopsy. No palpable masses in either breast No axillary lymphadenopathy CV: Regular rate and rhythm; no murmurs Abd: +bowel sounds, soft, non-tender, no masses Ext: Well-perfused; no edema Skin: Warm, dry; no sign of jaundice    Assessment & Plan BREAST NEOPLASM, TIS (LCIS), RIGHT (233.0  D05.01)  Current Plans Schedule for Surgery - right seed-localized lumpectomy. The surgical procedure has been discussed with the patient. Potential risks, benefits, alternative treatments, and expected outcomes have been explained. All of the patient's questions at this time have been answered. The likelihood of reaching the patient's treatment goal is good. The patient understand the proposed surgical procedure and wishes to proceed.  Imogene Burn. Georgette Dover, MD, [facility] Surgery  General/ Trauma Surgery  11/29/2014 6:48 AM

## 2014-11-29 NOTE — Op Note (Signed)
Preop diagnosis: LCIS right breast Postop diagnosis: Same Procedure performed: Right radioactive seed localized lumpectomy Surgeon:Malyk Girouard K. Anesthesia: Gen. LMA Indications: This is a 54 year old female with previous right breast biopsies who presented with a mass seen on mammogram. This was biopsied and showed LCIS. The patient has had consultations with oncology as well as plastic surgery. At this point she wishes to proceed with lumpectomy followed by tamoxifen  Description of procedure: A radioactive seed was placed 2 days ago by radiology. We confirmed the presence of the seed using the neoprobe in the holding area. The patient was brought to the operating room and placed in a supine position on the operating room table. After an adequate level of general anesthesia was obtained her right breast was prepped with ChloraPrep and draped sterile fashion. A timeout was taken to ensure the proper patient and proper procedure. The area of activity was near the nipple and was below her previous lumpectomy incision. We infiltrated this area with 0.25% Marcaine with epinephrine and made a transverse incision directly over the seed. Dissection was carried down into the breast tissue with cautery. We raised all 4 margins using the neoprobe for guidance. Once we had made our margins deep enough the specimen was removed entirely. The presence of the seed was confirmed with the neoprobe. There is no residual activity in the biopsy cavity. The specimen was oriented with a paint kit. Specimen mammogram showed that the clip and the seed were in place. This was confirmed by radiology. This was marked for pathology. We again examined the biopsy cavity for hemostasis. The wound was thoroughly irrigated. The wound was closed with 3-0 Vicryl and 4-0 Monocryl after placing 5 clips in the margins of the biopsy cavity. Steri-Strips and a clean dressing were applied. The patient was then extubated but recovery was stable  condition. All sponge, instrument, and needle counts are correct. The seed was transported in the specimen to pathology and they confirmed receipt of the radioactive seed.  Imogene Burn. Georgette Dover, MD, [facility] Surgery  General/ Trauma Surgery  11/29/2014 9:31 AM

## 2014-11-29 NOTE — Discharge Instructions (Signed)
[facility] °Office Phone Number [phone] ° °BREAST BIOPSY/ PARTIAL MASTECTOMY: POST OP INSTRUCTIONS ° °Always review your discharge instruction sheet given to you by the facility where your surgery was performed. ° °IF YOU HAVE DISABILITY OR FAMILY LEAVE FORMS, YOU MUST BRING THEM TO THE OFFICE FOR PROCESSING.  DO NOT GIVE THEM TO YOUR DOCTOR. ° °1. A prescription for pain medication may be given to you upon discharge.  Take your pain medication as prescribed, if needed.  If narcotic pain medicine is not needed, then you may take acetaminophen (Tylenol) or ibuprofen (Advil) as needed. °2. Take your usually prescribed medications unless otherwise directed °3. If you need a refill on your pain medication, please contact your pharmacy.  They will contact our office to request authorization.  Prescriptions will not be filled after 5pm or on week-ends. °4. You should eat very light the first 24 hours after surgery, such as soup, crackers, pudding, etc.  Resume your normal diet the day after surgery. °5. Most patients will experience some swelling and bruising in the breast.  Ice packs and a good support bra will help.  Swelling and bruising can take several days to resolve.  °6. It is common to experience some constipation if taking pain medication after surgery.  Increasing fluid intake and taking a stool softener will usually help or prevent this problem from occurring.  A mild laxative (Milk of Magnesia or Miralax) should be taken according to package directions if there are no bowel movements after 48 hours. °7. Unless discharge instructions indicate otherwise, you may remove your bandages 48 hours after surgery, and you may shower at that time.  You will have steri-strips (small skin tapes) in place directly over the incision.  These strips should be left on the skin for 7-10 days.   Any sutures or staples will be removed at the office during your follow-up visit. °8. ACTIVITIES:  You may resume  regular daily activities (gradually increasing) beginning the next day.  Wearing a good support bra or sports bra minimizes pain and swelling.  You may have sexual intercourse when it is comfortable. °a. You may drive when you no longer are taking prescription pain medication, you can comfortably wear a seatbelt, and you can safely maneuver your car and apply brakes. °b. RETURN TO WORK:  1-2 weeks °9. You should see your doctor in the office for a follow-up appointment approximately two weeks after your surgery.  Your doctor’s nurse will typically make your follow-up appointment when she calls you with your pathology report.  Expect your pathology report 2-3 business days after your surgery.  You may call to check if you do not hear from us after three days. °10. OTHER INSTRUCTIONS: _______________________________________________________________________________________________ _____________________________________________________________________________________________________________________________________ °_____________________________________________________________________________________________________________________________________ °_____________________________________________________________________________________________________________________________________ ° °WHEN TO CALL YOUR DOCTOR: °1. Fever over 101.0 °2. Nausea and/or vomiting. °3. Extreme swelling or bruising. °4. Continued bleeding from incision. °5. Increased pain, redness, or drainage from the incision. ° °The clinic staff is available to answer your questions during regular business hours.  Please don’t hesitate to call and ask to speak to one of the nurses for clinical concerns.  If you have a medical emergency, go to the nearest emergency room or call 911.  A surgeon from [facility] is always on call at the hospital. ° °For further questions, please visit [email or web address]  ° ° ° °Post Anesthesia Home Care  Instructions ° °Activity: °Get plenty of rest for the remainder of the day. A responsible adult should stay   with you for 24 hours following the procedure.  °For the next 24 hours, DO NOT: °-Drive a car °-Operate machinery °-Drink alcoholic beverages °-Take any medication unless instructed by your physician °-Make any legal decisions or sign important papers. ° °Meals: °Start with liquid foods such as gelatin or soup. Progress to regular foods as tolerated. Avoid greasy, spicy, heavy foods. If nausea and/or vomiting occur, drink only clear liquids until the nausea and/or vomiting subsides. Call your physician if vomiting continues. ° °Special Instructions/Symptoms: °Your throat may feel dry or sore from the anesthesia or the breathing tube placed in your throat during surgery. If this causes discomfort, gargle with warm salt water. The discomfort should disappear within 24 hours. ° °If you had a scopolamine patch placed behind your ear for the management of post- operative nausea and/or vomiting: ° °1. The medication in the patch is effective for 72 hours, after which it should be removed.  Wrap patch in a tissue and discard in the trash. Wash hands thoroughly with soap and water. °2. You may remove the patch earlier than 72 hours if you experience unpleasant side effects which may include dry mouth, dizziness or visual disturbances. °3. Avoid touching the patch. Wash your hands with soap and water after contact with the patch. °  ° °

## 2014-11-29 NOTE — Transfer of Care (Signed)
Immediate Anesthesia Transfer of Care Note  Patient: Christine Francis  Procedure(s) Performed: Procedure(s): BREAST LUMPECTOMY WITH RADIOACTIVE SEED LOCALIZATION (Right)  Patient Location: PACU  Anesthesia Type:General  Level of Consciousness: awake, oriented, sedated and patient cooperative  Airway & Oxygen Therapy: Patient Spontanous Breathing and Patient connected to face mask oxygen  Post-op Assessment: Report given to RN and Post -op Vital signs reviewed and stable  Post vital signs: Reviewed and stable  Last Vitals:  Filed Vitals:   11/29/14 0739  BP: 123/73  Pulse: 67  Temp: 36.9 C  Resp: 20    Complications: No apparent anesthesia complications

## 2014-11-29 NOTE — Anesthesia Postprocedure Evaluation (Signed)
  Anesthesia Post-op Note  Patient: Christine Francis  Procedure(s) Performed: Procedure(s): BREAST LUMPECTOMY WITH RADIOACTIVE SEED LOCALIZATION (Right)  Patient Location: PACU  Anesthesia Type: General   Level of Consciousness: awake, alert  and oriented  Airway and Oxygen Therapy: Patient Spontanous Breathing  Post-op Pain: mild  Post-op Assessment: Post-op Vital signs reviewed  Post-op Vital Signs: Reviewed  Last Vitals:  Filed Vitals:   11/29/14 1030  BP: 153/93  Pulse: 65  Temp:   Resp: 14    Complications: No apparent anesthesia complications

## 2014-11-29 NOTE — Anesthesia Preprocedure Evaluation (Signed)
Anesthesia Evaluation  Patient identified by MRN, date of birth, ID band Patient awake    Reviewed: Allergy & Precautions, NPO status , Patient's Chart, lab work & pertinent test results  Airway Mallampati: I  TM Distance: >3 FB Neck ROM: Full    Dental  (+) Teeth Intact, Dental Advisory Given   Pulmonary asthma ,  breath sounds clear to auscultation        Cardiovascular hypertension, Pt. on medications Rhythm:Regular Rate:Normal     Neuro/Psych    GI/Hepatic GERD-  Medicated and Controlled,  Endo/Other    Renal/GU      Musculoskeletal   Abdominal   Peds  Hematology   Anesthesia Other Findings   Reproductive/Obstetrics                             Anesthesia Physical Anesthesia Plan  ASA: II  Anesthesia Plan: General   Post-op Pain Management:    Induction: Intravenous  Airway Management Planned: LMA  Additional Equipment:   Intra-op Plan:   Post-operative Plan: Extubation in OR  Informed Consent: I have reviewed the patients History and Physical, chart, labs and discussed the procedure including the risks, benefits and alternatives for the proposed anesthesia with the patient or authorized representative who has indicated his/her understanding and acceptance.   Dental advisory given  Plan Discussed with: CRNA, Anesthesiologist and Surgeon  Anesthesia Plan Comments:         Anesthesia Quick Evaluation

## 2014-11-29 NOTE — Anesthesia Procedure Notes (Signed)
Procedure Name: LMA Insertion Date/Time: 11/29/2014 8:37 AM Performed by: Lyndee Leo Pre-anesthesia Checklist: Patient identified, Emergency Drugs available, Suction available and Patient being monitored Patient Re-evaluated:Patient Re-evaluated prior to inductionOxygen Delivery Method: Circle System Utilized Preoxygenation: Pre-oxygenation with 100% oxygen Intubation Type: IV induction Ventilation: Mask ventilation without difficulty LMA: LMA inserted LMA Size: 4.0 Number of attempts: 1 Airway Equipment and Method: Bite block Placement Confirmation: positive ETCO2 Tube secured with: Tape Dental Injury: Teeth and Oropharynx as per pre-operative assessment

## 2014-11-30 ENCOUNTER — Encounter (HOSPITAL_BASED_OUTPATIENT_CLINIC_OR_DEPARTMENT_OTHER): Payer: Self-pay | Admitting: Surgery

## 2014-12-04 ENCOUNTER — Telehealth: Payer: Self-pay | Admitting: *Deleted

## 2014-12-04 ENCOUNTER — Telehealth: Payer: Self-pay | Admitting: Hematology and Oncology

## 2014-12-04 DIAGNOSIS — D0501 Lobular carcinoma in situ of right breast: Secondary | ICD-10-CM

## 2014-12-04 MED ORDER — TAMOXIFEN CITRATE 20 MG PO TABS: 20.0000 mg | ORAL_TABLET | Freq: Every day | ORAL | Status: DC

## 2014-12-04 NOTE — Addendum Note (Signed)
Addended by: Prentiss Bells on: 12/04/2014 06:17 PM   Modules accepted: Orders

## 2014-12-04 NOTE — Telephone Encounter (Signed)
Advised pt tamoxifen will be ordered, confirmed pharmacy.  Pt advised she would rather wait to see MD until 3 mo tox check.  pof sent for 3 mo lab and OV and to cancel 5/27 appt.  Let pt know scheduling would call her to schedule the 3 month appt.  Tamoxifen ordered per ofc note - receipt confirmed.

## 2014-12-04 NOTE — Telephone Encounter (Signed)
s.w. pt and advise don 5.27 appt....pt ok and aware °

## 2014-12-04 NOTE — Telephone Encounter (Signed)
Received VM re: meeting with Dr. Lindi Adie to talk about starting Tamoxifen. States she had a lumpectomy and has decided to proceed with Tamoxifen. Message routed to Dr. Lindi Adie and desk RNs. POF placed for office visit.

## 2014-12-05 ENCOUNTER — Other Ambulatory Visit: Payer: Self-pay | Admitting: *Deleted

## 2014-12-05 ENCOUNTER — Telehealth: Payer: Self-pay | Admitting: Hematology and Oncology

## 2014-12-05 NOTE — Telephone Encounter (Signed)
lvm for pt regarding to SEpt appt.....mailed pt appt sched and letter °

## 2014-12-08 ENCOUNTER — Ambulatory Visit: Payer: BLUE CROSS/BLUE SHIELD | Admitting: Hematology and Oncology

## 2014-12-12 ENCOUNTER — Telehealth: Payer: Self-pay

## 2014-12-12 NOTE — Telephone Encounter (Signed)
Report rcvd from [facility] for radioactive pellet placement.  Reviewed by Dr. Lindi Adie.  Sent to scan.

## 2014-12-15 ENCOUNTER — Other Ambulatory Visit: Payer: Self-pay | Admitting: Obstetrics and Gynecology

## 2014-12-19 LAB — CYTOLOGY - PAP

## 2015-02-07 ENCOUNTER — Other Ambulatory Visit: Payer: Self-pay

## 2015-02-07 DIAGNOSIS — D0501 Lobular carcinoma in situ of right breast: Secondary | ICD-10-CM

## 2015-02-07 MED ORDER — TAMOXIFEN CITRATE 20 MG PO TABS: 20.0000 mg | ORAL_TABLET | Freq: Every day | ORAL | Status: DC

## 2015-03-26 ENCOUNTER — Other Ambulatory Visit: Payer: Self-pay

## 2015-03-26 DIAGNOSIS — D0501 Lobular carcinoma in situ of right breast: Secondary | ICD-10-CM

## 2015-03-27 ENCOUNTER — Telehealth: Payer: Self-pay | Admitting: Hematology and Oncology

## 2015-03-27 ENCOUNTER — Ambulatory Visit (HOSPITAL_BASED_OUTPATIENT_CLINIC_OR_DEPARTMENT_OTHER): Payer: BLUE CROSS/BLUE SHIELD | Admitting: Hematology and Oncology

## 2015-03-27 ENCOUNTER — Encounter: Payer: Self-pay | Admitting: Hematology and Oncology

## 2015-03-27 ENCOUNTER — Other Ambulatory Visit (HOSPITAL_BASED_OUTPATIENT_CLINIC_OR_DEPARTMENT_OTHER): Payer: BLUE CROSS/BLUE SHIELD

## 2015-03-27 VITALS — BP 109/54 | HR 69 | Temp 98.6°F | Resp 18

## 2015-03-27 DIAGNOSIS — D0501 Lobular carcinoma in situ of right breast: Secondary | ICD-10-CM

## 2015-03-27 DIAGNOSIS — F419 Anxiety disorder, unspecified: Secondary | ICD-10-CM

## 2015-03-27 LAB — COMPREHENSIVE METABOLIC PANEL (CC13)
ALT: 25 U/L (ref 0–55)
ANION GAP: 9 meq/L (ref 3–11)
AST: 19 U/L (ref 5–34)
Albumin: 3.9 g/dL (ref 3.5–5.0)
Alkaline Phosphatase: 50 U/L (ref 40–150)
BUN: 16.2 mg/dL (ref 7.0–26.0)
CHLORIDE: 104 meq/L (ref 98–109)
CO2: 29 mEq/L (ref 22–29)
Calcium: 9.6 mg/dL (ref 8.4–10.4)
Creatinine: 0.8 mg/dL (ref 0.6–1.1)
EGFR: 82 mL/min/{1.73_m2} — ABNORMAL LOW (ref >90)
Glucose: 137 mg/dl (ref 70–140)
Potassium: 3.4 mEq/L — ABNORMAL LOW (ref 3.5–5.1)
Sodium: 141 mEq/L (ref 136–145)
Total Bilirubin: 0.73 mg/dL (ref 0.20–1.20)
Total Protein: 7.2 g/dL (ref 6.4–8.3)

## 2015-03-27 LAB — CBC WITH DIFFERENTIAL/PLATELET
BASO%: 1.2 % (ref 0.0–2.0)
Basophils Absolute: 0.1 10*3/uL (ref 0.0–0.1)
EOS ABS: 0.2 10*3/uL (ref 0.0–0.5)
EOS%: 2.8 % (ref 0.0–7.0)
HCT: 41 % (ref 34.8–46.6)
HGB: 13.8 g/dL (ref 11.6–15.9)
LYMPH%: 27.9 % (ref 14.0–49.7)
MCH: 28.8 pg (ref 25.1–34.0)
MCHC: 33.7 g/dL (ref 31.5–36.0)
MCV: 85.6 fL (ref 79.5–101.0)
MONO#: 0.4 10*3/uL (ref 0.1–0.9)
MONO%: 5.8 % (ref 0.0–14.0)
NEUT#: 4.5 10*3/uL (ref 1.5–6.5)
NEUT%: 62.3 % (ref 38.4–76.8)
PLATELETS: 214 10*3/uL (ref 145–400)
RBC: 4.79 10*6/uL (ref 3.70–5.45)
RDW: 13.4 % (ref 11.2–14.5)
WBC: 7.2 10*3/uL (ref 3.9–10.3)
lymph#: 2 10*3/uL (ref 0.9–3.3)

## 2015-03-27 NOTE — Assessment & Plan Note (Signed)
Right lumpectomy 11/29/2014: LCIS with fibrocystic changes with usual ductal hyperplasia,PASH, 0.8 cm margins for LCIS  Current treatment: Adjuvant tamoxifen started 02/07/2015 Tamoxifen toxicities:  Return to clinic in 6 months for follow-up

## 2015-03-27 NOTE — Telephone Encounter (Signed)
Appointment made and avs printed

## 2015-03-27 NOTE — Progress Notes (Signed)
Patient Care Team: Molli Posey, MD as PCP - General (Obstetrics and Gynecology)  DIAGNOSIS: No matching staging information was found for the patient.  SUMMARY OF ONCOLOGIC HISTORY:   Neoplasm of right breast, primary tumor staging category Tis: lobular carcinoma in situ (LCIS)   07/19/2014 Initial Biopsy Rt.Breast Biopsy: LCIS   11/29/2014 Surgery Right lumpectomy: LCIS with fibrocystic changes with usual ductal hyperplasia,PASH, 0.8 cm margins for LCIS   02/06/2015 -  Anti-estrogen oral therapy tamoxifen 20 mg daily    CHIEF COMPLIANT: follow-up on tamoxifen therapy  INTERVAL HISTORY: Christine Francis is a 54 year old with above-mentioned history of right breast LCIS who was placed on tamoxifen for risk reduction of breast cancer in July 2016. She is here for three-month follow-up. She reports that the tamoxifen initially caused a lot of fatigue but it might be attributable to her surgery. She is also had some mood changes and depression symptoms all of which are slowly improving. She is also extremely busy at work with [employer]. She works in the Product manager. Her work is extremely stressful and busy. She has not had any time to reflect on all that has happened. She has not had tested her exercises either.  REVIEW OF SYSTEMS:   Constitutional: Denies fevers, chills or abnormal weight loss Eyes: Denies blurriness of vision Ears, nose, mouth, throat, and face: Denies mucositis or sore throat Respiratory: Denies cough, dyspnea or wheezes Cardiovascular: Denies palpitation, chest discomfort or lower extremity swelling Gastrointestinal:  Denies nausea, heartburn or change in bowel habits Skin: Denies abnormal skin rashes Lymphatics: Denies new lymphadenopathy or easy bruising Neurological:Denies numbness, tingling or new weaknesses Behavioral/Psych: anxiety for which she was started on Klonopin  Breast:  denies any pain or lumps or nodules in either breasts All other systems were  reviewed with the patient and are negative.  I have reviewed the past medical history, past surgical history, social history and family history with the patient and they are unchanged from previous note.  ALLERGIES:  is allergic to amoxicillin; dilaudid; erythromycin; penicillins; and sulfa drugs cross reactors.  MEDICATIONS:  Current Outpatient Prescriptions  Medication Sig Dispense Refill  . albuterol (PROVENTIL HFA;VENTOLIN HFA) 108 (90 BASE) MCG/ACT inhaler Inhale into the lungs every 6 (six) hours as needed for wheezing or shortness of breath.    Marland Kitchen atenolol-chlorthalidone (TENORETIC) 50-25 MG per tablet Take 1 tablet by mouth daily.      Marland Kitchen CALCIUM PO Take 1,200 mg by mouth.     . Cholecalciferol (VITAMIN D-3 PO) Take 1,600 Int'l Units by mouth.     . clonazePAM (KLONOPIN) 1 MG tablet TAKE 1/2 TO 1 TABLET BY MOUTH TWICE A DAY AS NEEDED FOR ANXIETY  0  . Fluticasone Furoate-Vilanterol 100-25 MCG/INH AEPB Inhale into the lungs.    . Fluticasone Furoate-Vilanterol 100-25 MCG/INH AEPB Inhale into the lungs.    Marland Kitchen HYDROcodone-acetaminophen (NORCO/VICODIN) 5-325 MG per tablet Take 1 tablet by mouth every 4 (four) hours as needed. 40 tablet 0  . montelukast (SINGULAIR) 10 MG tablet Take 10 mg by mouth at bedtime.    . Omeprazole (PRILOSEC PO) Take 20 mg by mouth daily.     . Potassium Chloride Crys CR (KLOR-CON M20 PO) Take 20 mEq by mouth.      . tamoxifen (NOLVADEX) 20 MG tablet Take 1 tablet (20 mg total) by mouth daily. 90 tablet 0   No current facility-administered medications for this visit.    PHYSICAL EXAMINATION: ECOG PERFORMANCE STATUS: 1 - Symptomatic but completely  ambulatory  Filed Vitals:   03/27/15 0953  BP: 109/54  Pulse: 69  Temp: 98.6 F (37 C)  Resp: 18   Filed Weights    GENERAL:alert, no distress and comfortable SKIN: skin color, texture, turgor are normal, no rashes or significant lesions EYES: normal, Conjunctiva are pink and non-injected, sclera  clear OROPHARYNX:no exudate, no erythema and lips, buccal mucosa, and tongue normal  NECK: supple, thyroid normal size, non-tender, without nodularity LYMPH:  no palpable lymphadenopathy in the cervical, axillary or inguinal LUNGS: clear to auscultation and percussion with normal breathing effort HEART: regular rate & rhythm and no murmurs and no lower extremity edema ABDOMEN:abdomen soft, non-tender and normal bowel sounds Musculoskeletal:no cyanosis of digits and no clubbing  NEURO: alert & oriented x 3 with fluent speech, no focal motor/sensory deficits, anxiety started on Klonopin  LABORATORY DATA:  I have reviewed the data as listed   Chemistry      Component Value Date/Time   NA 141 03/27/2015 0937   NA 138 11/28/2014 1045   K 3.4* 03/27/2015 0937   K 3.0* 11/28/2014 1045   CL 100* 11/28/2014 1045   CO2 29 03/27/2015 0937   CO2 29 11/28/2014 1045   BUN 16.2 03/27/2015 0937   BUN 15 11/28/2014 1045   CREATININE 0.8 03/27/2015 0937   CREATININE 0.79 11/28/2014 1045      Component Value Date/Time   CALCIUM 9.6 03/27/2015 0937   CALCIUM 9.9 11/28/2014 1045   ALKPHOS 50 03/27/2015 0937   AST 19 03/27/2015 0937   ALT 25 03/27/2015 0937   BILITOT 0.73 03/27/2015 0937       Lab Results  Component Value Date   WBC 7.2 03/27/2015   HGB 13.8 03/27/2015   HCT 41.0 03/27/2015   MCV 85.6 03/27/2015   PLT 214 03/27/2015   NEUTROABS 4.5 03/27/2015   ASSESSMENT & PLAN:  Neoplasm of right breast, primary tumor staging category Tis: lobular carcinoma in situ (LCIS) Right lumpectomy 11/29/2014: LCIS with fibrocystic changes with usual ductal hyperplasia,PASH, 0.8 cm margins for LCIS  Current treatment: Adjuvant tamoxifen started 02/07/2015 Tamoxifen toxicities: 1. Occasional hot flashes The symptoms are slowly improving over time.  Anxiety: Patient was started on Klonopin. I encouraged her to do exercise, yoga, meditation instead of prescription medications.  Survivorship:  Discussed the importance of physical exercise in decreasing the likelihood of breast cancer recurrence. Recommended 30 mins daily 6 days a week of either brisk walking or cycling or swimming. Encouraged patient to eat more fruits and vegetables and decrease red meat.   Return to clinic in 6 months for follow-up   No orders of the defined types were placed in this encounter.   The patient has a good understanding of the overall plan. she agrees with it. she will call with any problems that may develop before the next visit here.   Rulon Eisenmenger, MD

## 2015-05-16 ENCOUNTER — Other Ambulatory Visit: Payer: Self-pay | Admitting: Hematology and Oncology

## 2015-05-16 NOTE — Telephone Encounter (Signed)
Chart reviewed.

## 2015-09-25 ENCOUNTER — Ambulatory Visit (HOSPITAL_BASED_OUTPATIENT_CLINIC_OR_DEPARTMENT_OTHER): Payer: BLUE CROSS/BLUE SHIELD | Admitting: Hematology and Oncology

## 2015-09-25 ENCOUNTER — Encounter: Payer: Self-pay | Admitting: Hematology and Oncology

## 2015-09-25 ENCOUNTER — Telehealth: Payer: Self-pay | Admitting: Hematology and Oncology

## 2015-09-25 VITALS — BP 117/63 | HR 64 | Temp 97.9°F | Resp 18 | Ht 66.0 in | Wt 177.8 lb

## 2015-09-25 DIAGNOSIS — F419 Anxiety disorder, unspecified: Secondary | ICD-10-CM | POA: Diagnosis not present

## 2015-09-25 DIAGNOSIS — D0501 Lobular carcinoma in situ of right breast: Secondary | ICD-10-CM | POA: Diagnosis not present

## 2015-09-25 NOTE — Progress Notes (Signed)
Patient Care Team: Molli Posey, MD as PCP - General (Obstetrics and Gynecology)   SUMMARY OF ONCOLOGIC HISTORY:   Neoplasm of right breast, primary tumor staging category Tis: lobular carcinoma in situ (LCIS)   07/19/2014 Initial Biopsy Rt.Breast Biopsy: LCIS   11/29/2014 Surgery Right lumpectomy: LCIS with fibrocystic changes with usual ductal hyperplasia,PASH, 0.8 cm margins for LCIS   02/06/2015 -  Anti-estrogen oral therapy tamoxifen 20 mg daily    CHIEF COMPLIANT: follow-up on tamoxifen  INTERVAL HISTORY: Christine Francis is a 55 year old with above-mentioned history of LCIS right breast currently on tamoxifen therapy. She is tolerating it extremely well. She has no side effects or concerns. Occasional hot flashes which she had initially were gone. The anxiety attacks that she had were also improved. She has noticed the hair is not more coarser than before.  REVIEW OF SYSTEMS:   Constitutional: Denies fevers, chills or abnormal weight loss Eyes: Denies blurriness of vision Ears, nose, mouth, throat, and face: Denies mucositis or sore throat Respiratory: Denies cough, dyspnea or wheezes Cardiovascular: Denies palpitation, chest discomfort Gastrointestinal:  Denies nausea, heartburn or change in bowel habits Skin: Denies abnormal skin rashes Lymphatics: Denies new lymphadenopathy or easy bruising Neurological:Denies numbness, tingling or new weaknesses Behavioral/Psych: Mood is stable, no new changes  Extremities: No lower extremity edema Breast:  denies any pain or lumps or nodules in either breasts All other systems were reviewed with the patient and are negative.  I have reviewed the past medical history, past surgical history, social history and family history with the patient and they are unchanged from previous note.  ALLERGIES:  is allergic to amoxicillin; dilaudid; erythromycin; penicillins; and sulfa drugs cross reactors.  MEDICATIONS:  Current Outpatient  Prescriptions  Medication Sig Dispense Refill  . albuterol (PROVENTIL HFA;VENTOLIN HFA) 108 (90 BASE) MCG/ACT inhaler Inhale into the lungs every 6 (six) hours as needed for wheezing or shortness of breath.    Marland Kitchen atenolol-chlorthalidone (TENORETIC) 50-25 MG per tablet Take 1 tablet by mouth daily.      Marland Kitchen CALCIUM PO Take 1,200 mg by mouth.     . Cholecalciferol (VITAMIN D-3 PO) Take 1,600 Int'l Units by mouth.     . Fluticasone Furoate-Vilanterol 100-25 MCG/INH AEPB Inhale into the lungs.    . montelukast (SINGULAIR) 10 MG tablet Take 10 mg by mouth at bedtime.    . Omeprazole (PRILOSEC PO) Take 20 mg by mouth daily.     . Potassium Chloride Crys CR (KLOR-CON M20 PO) Take 20 mEq by mouth.      . tamoxifen (NOLVADEX) 20 MG tablet TAKE 1 TABLET BY MOUTH EVERY DAY 90 tablet 1   No current facility-administered medications for this visit.    PHYSICAL EXAMINATION: ECOG PERFORMANCE STATUS: 0 - Asymptomatic  Filed Vitals:   09/25/15 1007  BP: 117/63  Pulse: 64  Temp: 97.9 F (36.6 C)  Resp: 18   Filed Weights   09/25/15 1007  Weight: 177 lb 12.8 oz (80.65 kg)    GENERAL:alert, no distress and comfortable SKIN: skin color, texture, turgor are normal, no rashes or significant lesions EYES: normal, Conjunctiva are pink and non-injected, sclera clear OROPHARYNX:no exudate, no erythema and lips, buccal mucosa, and tongue normal  NECK: supple, thyroid normal size, non-tender, without nodularity LYMPH:  no palpable lymphadenopathy in the cervical, axillary or inguinal LUNGS: clear to auscultation and percussion with normal breathing effort HEART: regular rate & rhythm and no murmurs and no lower extremity edema ABDOMEN:abdomen soft, non-tender and  normal bowel sounds MUSCULOSKELETAL:no cyanosis of digits and no clubbing  NEURO: alert & oriented x 3 with fluent speech, no focal motor/sensory deficits EXTREMITIES: No lower extremity edema BREAST: No palpable masses or nodules in either right  or left breasts. No palpable axillary supraclavicular or infraclavicular adenopathy no breast tenderness or nipple discharge. (exam performed in the presence of a chaperone)  LABORATORY DATA:  I have reviewed the data as listed   Chemistry      Component Value Date/Time   NA 141 03/27/2015 0937   NA 138 11/28/2014 1045   K 3.4* 03/27/2015 0937   K 3.0* 11/28/2014 1045   CL 100* 11/28/2014 1045   CO2 29 03/27/2015 0937   CO2 29 11/28/2014 1045   BUN 16.2 03/27/2015 0937   BUN 15 11/28/2014 1045   CREATININE 0.8 03/27/2015 0937   CREATININE 0.79 11/28/2014 1045      Component Value Date/Time   CALCIUM 9.6 03/27/2015 0937   CALCIUM 9.9 11/28/2014 1045   ALKPHOS 50 03/27/2015 0937   AST 19 03/27/2015 0937   ALT 25 03/27/2015 0937   BILITOT 0.73 03/27/2015 0937       Lab Results  Component Value Date   WBC 7.2 03/27/2015   HGB 13.8 03/27/2015   HCT 41.0 03/27/2015   MCV 85.6 03/27/2015   PLT 214 03/27/2015   NEUTROABS 4.5 03/27/2015   ASSESSMENT & PLAN:  Neoplasm of right breast, primary tumor staging category Tis: lobular carcinoma in situ (LCIS) Right lumpectomy 11/29/2014: LCIS with fibrocystic changes with usual ductal hyperplasia,PASH, 0.8 cm margins for LCIS  Current treatment: Adjuvant tamoxifen started 02/07/2015 Tamoxifen toxicities: 1. Occasional hot flashes: Does not bother her at all.  Breast Cancer Surveillance: 1. Breast exam 09/25/2015: Normal 2. Mammogram December 2016 No abnormalities. Postsurgical changes. Breast Density Category B. I recommended that she get 3-D mammograms for surveillance. Discussed the differences between different breast density categories.   Anxiety: much improved and she is not taking any medications.  Return to clinic in 1 yearfor follow-up  No orders of the defined types were placed in this encounter.   The patient has a good understanding of the overall plan. she agrees with it. she will call with any problems that may  develop before the next visit here.   Rulon Eisenmenger, MD 09/25/2015

## 2015-09-25 NOTE — Telephone Encounter (Signed)
appt made and avs printed °

## 2015-09-25 NOTE — Assessment & Plan Note (Signed)
Right lumpectomy 11/29/2014: LCIS with fibrocystic changes with usual ductal hyperplasia,PASH, 0.8 cm margins for LCIS  Current treatment: Adjuvant tamoxifen started 02/07/2015 Tamoxifen toxicities: 1. Occasional hot flashes The symptoms are slowly improving over time.  Anxiety: Patient was started on Klonopin. I encouraged her to do exercise, yoga, meditation instead of prescription medications.  Return to clinic in 6 months for follow-up

## 2015-12-02 ENCOUNTER — Other Ambulatory Visit: Payer: Self-pay | Admitting: Hematology and Oncology

## 2015-12-03 ENCOUNTER — Other Ambulatory Visit: Payer: Self-pay | Admitting: *Deleted

## 2015-12-03 DIAGNOSIS — D0501 Lobular carcinoma in situ of right breast: Secondary | ICD-10-CM

## 2015-12-03 MED ORDER — TAMOXIFEN CITRATE 20 MG PO TABS
20.0000 mg | ORAL_TABLET | Freq: Every day | ORAL | Status: DC
Start: 2015-12-03 — End: 2016-11-22

## 2016-01-22 ENCOUNTER — Encounter: Payer: Self-pay | Admitting: Internal Medicine

## 2016-09-18 ENCOUNTER — Telehealth: Payer: Self-pay | Admitting: Hematology and Oncology

## 2016-09-18 NOTE — Telephone Encounter (Signed)
Called and left a message on home VM and a call back number for questions or concerns

## 2016-09-22 NOTE — Telephone Encounter (Signed)
Patient called today and said that he appointment would not work for her Please call patient before scheduling she will be out of town and wants to make sure she can make the appointment  Call back # is [phone] and may leave a voicemail

## 2016-09-23 ENCOUNTER — Ambulatory Visit: Payer: BLUE CROSS/BLUE SHIELD | Admitting: Hematology and Oncology

## 2016-09-23 NOTE — Assessment & Plan Note (Deleted)
Right lumpectomy 11/29/2014: LCIS with fibrocystic changes with usual ductal hyperplasia,PASH, 0.8 cm margins for LCIS  Current treatment: Adjuvant tamoxifen started 02/07/2015 Tamoxifen toxicities: 1. Occasional hot flashes: Does not bother her at all.  Breast Cancer Surveillance: 1. Breast exam 10/10/2016: Benign 2. Mammogram December 2016 No abnormalities. Postsurgical changes. Breast Density Category B. I recommended that she get 3-D mammograms for surveillance. Discussed the differences between different breast density categories.   Anxiety: much improved and she is not taking any medications.  Return to clinic in 1 year for follow-up

## 2016-09-24 ENCOUNTER — Ambulatory Visit: Payer: BLUE CROSS/BLUE SHIELD | Admitting: Hematology and Oncology

## 2016-10-16 DIAGNOSIS — H10413 Chronic giant papillary conjunctivitis, bilateral: Secondary | ICD-10-CM | POA: Diagnosis not present

## 2016-10-16 DIAGNOSIS — H43813 Vitreous degeneration, bilateral: Secondary | ICD-10-CM | POA: Diagnosis not present

## 2016-10-21 DIAGNOSIS — Z6830 Body mass index (BMI) 30.0-30.9, adult: Secondary | ICD-10-CM | POA: Diagnosis not present

## 2016-10-21 DIAGNOSIS — Z13820 Encounter for screening for osteoporosis: Secondary | ICD-10-CM | POA: Diagnosis not present

## 2016-10-21 DIAGNOSIS — Z01419 Encounter for gynecological examination (general) (routine) without abnormal findings: Secondary | ICD-10-CM | POA: Diagnosis not present

## 2016-10-22 ENCOUNTER — Telehealth: Payer: Self-pay | Admitting: Hematology and Oncology

## 2016-10-22 NOTE — Telephone Encounter (Signed)
Left message for patient re calling office to schedule f/u with Dr. Lindi Adie.

## 2016-10-29 ENCOUNTER — Encounter: Payer: Self-pay | Admitting: Hematology and Oncology

## 2016-10-29 ENCOUNTER — Telehealth: Payer: Self-pay | Admitting: Hematology and Oncology

## 2016-10-29 ENCOUNTER — Ambulatory Visit (HOSPITAL_BASED_OUTPATIENT_CLINIC_OR_DEPARTMENT_OTHER): Payer: BLUE CROSS/BLUE SHIELD | Admitting: Hematology and Oncology

## 2016-10-29 DIAGNOSIS — D0501 Lobular carcinoma in situ of right breast: Secondary | ICD-10-CM | POA: Diagnosis not present

## 2016-10-29 DIAGNOSIS — F419 Anxiety disorder, unspecified: Secondary | ICD-10-CM | POA: Diagnosis not present

## 2016-10-29 NOTE — Progress Notes (Signed)
Patient Care Team: Molli Posey, MD as PCP - General (Obstetrics and Gynecology)  DIAGNOSIS:  Encounter Diagnosis  Name Primary?  . Neoplasm of right breast, primary tumor staging category Tis: lobular carcinoma in situ (LCIS)     SUMMARY OF ONCOLOGIC HISTORY:   Neoplasm of right breast, primary tumor staging category Tis: lobular carcinoma in situ (LCIS)   07/19/2014 Initial Biopsy    Rt.Breast Biopsy: LCIS      11/29/2014 Surgery    Right lumpectomy: LCIS with fibrocystic changes with usual ductal hyperplasia,PASH, 0.8 cm margins for LCIS      02/06/2015 -  Anti-estrogen oral therapy    tamoxifen 20 mg daily       CHIEF COMPLIANT: Follow-up on tamoxifen therapy  INTERVAL HISTORY: Christine Francis is a 56 year old with above-mentioned history of LCIS who underwent lumpectomy and is here on tamoxifen therapy. She has been tolerating tamoxifen fairly well except for occasional hot flashes but they're manageable.   REVIEW OF SYSTEMS:   Constitutional: Denies fevers, chills or abnormal weight loss Eyes: Denies blurriness of vision Ears, nose, mouth, throat, and face: Denies mucositis or sore throat Respiratory: Denies cough, dyspnea or wheezes Cardiovascular: Denies palpitation, chest discomfort Gastrointestinal:  Denies nausea, heartburn or change in bowel habits Skin: Denies abnormal skin rashes Lymphatics: Denies new lymphadenopathy or easy bruising Neurological:Denies numbness, tingling or new weaknesses Behavioral/Psych: Mood is stable, no new changes  Extremities: No lower extremity edema Breast:  denies any pain or lumps or nodules in either breasts All other systems were reviewed with the patient and are negative.  I have reviewed the past medical history, past surgical history, social history and family history with the patient and they are unchanged from previous note.  ALLERGIES:  is allergic to amoxicillin; dilaudid [hydromorphone hcl]; erythromycin;  penicillins; and sulfa drugs cross reactors.  MEDICATIONS:  Current Outpatient Prescriptions  Medication Sig Dispense Refill  . albuterol (PROVENTIL HFA;VENTOLIN HFA) 108 (90 BASE) MCG/ACT inhaler Inhale into the lungs every 6 (six) hours as needed for wheezing or shortness of breath.    Marland Kitchen atenolol-chlorthalidone (TENORETIC) 50-25 MG per tablet Take 1 tablet by mouth daily.      Marland Kitchen CALCIUM PO Take 1,200 mg by mouth.     . Cholecalciferol (VITAMIN D-3 PO) Take 1,600 Int'l Units by mouth.     . Fluticasone Furoate-Vilanterol 100-25 MCG/INH AEPB Inhale into the lungs.    . montelukast (SINGULAIR) 10 MG tablet Take 10 mg by mouth at bedtime.    . Omeprazole (PRILOSEC PO) Take 20 mg by mouth daily.     . Potassium Chloride Crys CR (KLOR-CON M20 PO) Take 20 mEq by mouth.      . tamoxifen (NOLVADEX) 20 MG tablet Take 1 tablet (20 mg total) by mouth daily. 90 tablet 3   No current facility-administered medications for this visit.     PHYSICAL EXAMINATION: ECOG PERFORMANCE STATUS: 1 - Symptomatic but completely ambulatory  Vitals:   10/29/16 1020  BP: 121/60  Pulse: 62  Resp: 18  Temp: 97.9 F (36.6 C)   Filed Weights   10/29/16 1020  Weight: 188 lb 1.6 oz (85.3 kg)    GENERAL:alert, no distress and comfortable SKIN: skin color, texture, turgor are normal, no rashes or significant lesions EYES: normal, Conjunctiva are pink and non-injected, sclera clear OROPHARYNX:no exudate, no erythema and lips, buccal mucosa, and tongue normal  NECK: supple, thyroid normal size, non-tender, without nodularity LYMPH:  no palpable lymphadenopathy in the cervical, axillary  or inguinal LUNGS: clear to auscultation and percussion with normal breathing effort HEART: regular rate & rhythm and no murmurs and no lower extremity edema ABDOMEN:abdomen soft, non-tender and normal bowel sounds MUSCULOSKELETAL:no cyanosis of digits and no clubbing  NEURO: alert & oriented x 3 with fluent speech, no focal  motor/sensory deficits EXTREMITIES: No lower extremity edema BREAST: No palpable masses or nodules in either right or left breasts. No palpable axillary supraclavicular or infraclavicular adenopathy no breast tenderness or nipple discharge. (exam performed in the presence of a chaperone)  LABORATORY DATA:  I have reviewed the data as listed   Chemistry      Component Value Date/Time   NA 141 03/27/2015 0937   K 3.4 (L) 03/27/2015 0937   CL 100 (L) 11/28/2014 1045   CO2 29 03/27/2015 0937   BUN 16.2 03/27/2015 0937   CREATININE 0.8 03/27/2015 0937      Component Value Date/Time   CALCIUM 9.6 03/27/2015 0937   ALKPHOS 50 03/27/2015 0937   AST 19 03/27/2015 0937   ALT 25 03/27/2015 0937   BILITOT 0.73 03/27/2015 0937       Lab Results  Component Value Date   WBC 7.2 03/27/2015   HGB 13.8 03/27/2015   HCT 41.0 03/27/2015   MCV 85.6 03/27/2015   PLT 214 03/27/2015   NEUTROABS 4.5 03/27/2015    ASSESSMENT & PLAN:  Neoplasm of right breast, primary tumor staging category Tis: lobular carcinoma in situ (LCIS) Right lumpectomy 11/29/2014: LCIS with fibrocystic changes with usual ductal hyperplasia,PASH, 0.8 cm margins for LCIS  Current treatment: Adjuvant tamoxifen started 02/07/2015 Tamoxifen toxicities: 1. Occasional hot flashes: Does not bother her at all.  Breast Cancer Surveillance: 1. Breast exam 10/29/2016: Normal 2. Mammogram April 2018 at [facility] benign   Anxiety: much improved and she is not taking any medications.  Return to clinic in 1 year for follow-up   I spent 25 minutes talking to the patient of which more than half was spent in counseling and coordination of care.  No orders of the defined types were placed in this encounter.  The patient has a good understanding of the overall plan. she agrees with it. she will call with any problems that may develop before the next visit here.   Rulon Eisenmenger, MD 10/29/16

## 2016-10-29 NOTE — Telephone Encounter (Signed)
Gave patient avs report and appointments for April 2019.  °

## 2016-10-29 NOTE — Assessment & Plan Note (Signed)
Right lumpectomy 11/29/2014: LCIS with fibrocystic changes with usual ductal hyperplasia,PASH, 0.8 cm margins for LCIS  Current treatment: Adjuvant tamoxifen started 02/07/2015 Tamoxifen toxicities: 1. Occasional hot flashes: Does not bother her at all.  Breast Cancer Surveillance: 1. Breast exam 10/29/2016: Normal 2. Mammogram    Anxiety: much improved and she is not taking any medications.  Return to clinic in 1 year for follow-up

## 2016-11-22 ENCOUNTER — Other Ambulatory Visit: Payer: Self-pay | Admitting: Hematology and Oncology

## 2016-12-03 DIAGNOSIS — J301 Allergic rhinitis due to pollen: Secondary | ICD-10-CM | POA: Diagnosis not present

## 2016-12-04 DIAGNOSIS — J3081 Allergic rhinitis due to animal (cat) (dog) hair and dander: Secondary | ICD-10-CM | POA: Diagnosis not present

## 2016-12-04 DIAGNOSIS — J3089 Other allergic rhinitis: Secondary | ICD-10-CM | POA: Diagnosis not present

## 2016-12-10 DIAGNOSIS — R87610 Atypical squamous cells of undetermined significance on cytologic smear of cervix (ASC-US): Secondary | ICD-10-CM | POA: Diagnosis not present

## 2016-12-19 DIAGNOSIS — J3081 Allergic rhinitis due to animal (cat) (dog) hair and dander: Secondary | ICD-10-CM | POA: Diagnosis not present

## 2016-12-19 DIAGNOSIS — J301 Allergic rhinitis due to pollen: Secondary | ICD-10-CM | POA: Diagnosis not present

## 2016-12-19 DIAGNOSIS — J4520 Mild intermittent asthma, uncomplicated: Secondary | ICD-10-CM | POA: Diagnosis not present

## 2016-12-19 DIAGNOSIS — J3089 Other allergic rhinitis: Secondary | ICD-10-CM | POA: Diagnosis not present

## 2017-02-12 DIAGNOSIS — L918 Other hypertrophic disorders of the skin: Secondary | ICD-10-CM | POA: Diagnosis not present

## 2017-02-12 DIAGNOSIS — L814 Other melanin hyperpigmentation: Secondary | ICD-10-CM | POA: Diagnosis not present

## 2017-02-12 DIAGNOSIS — D239 Other benign neoplasm of skin, unspecified: Secondary | ICD-10-CM | POA: Diagnosis not present

## 2017-02-12 DIAGNOSIS — L821 Other seborrheic keratosis: Secondary | ICD-10-CM | POA: Diagnosis not present

## 2017-05-11 DIAGNOSIS — R3915 Urgency of urination: Secondary | ICD-10-CM | POA: Diagnosis not present

## 2017-05-11 DIAGNOSIS — N3946 Mixed incontinence: Secondary | ICD-10-CM | POA: Diagnosis not present

## 2017-05-11 DIAGNOSIS — N3020 Other chronic cystitis without hematuria: Secondary | ICD-10-CM | POA: Diagnosis not present

## 2017-09-02 ENCOUNTER — Encounter: Payer: Self-pay | Admitting: Internal Medicine

## 2017-09-18 ENCOUNTER — Other Ambulatory Visit: Payer: Self-pay

## 2017-09-18 DIAGNOSIS — L309 Dermatitis, unspecified: Secondary | ICD-10-CM | POA: Diagnosis not present

## 2017-09-18 DIAGNOSIS — L918 Other hypertrophic disorders of the skin: Secondary | ICD-10-CM | POA: Diagnosis not present

## 2017-09-18 DIAGNOSIS — C44319 Basal cell carcinoma of skin of other parts of face: Secondary | ICD-10-CM | POA: Diagnosis not present

## 2017-09-18 DIAGNOSIS — D485 Neoplasm of uncertain behavior of skin: Secondary | ICD-10-CM | POA: Diagnosis not present

## 2017-09-24 DIAGNOSIS — R928 Other abnormal and inconclusive findings on diagnostic imaging of breast: Secondary | ICD-10-CM | POA: Diagnosis not present

## 2017-09-24 DIAGNOSIS — Z853 Personal history of malignant neoplasm of breast: Secondary | ICD-10-CM | POA: Diagnosis not present

## 2017-10-26 DIAGNOSIS — Z01419 Encounter for gynecological examination (general) (routine) without abnormal findings: Secondary | ICD-10-CM | POA: Diagnosis not present

## 2017-10-26 DIAGNOSIS — Z6831 Body mass index (BMI) 31.0-31.9, adult: Secondary | ICD-10-CM | POA: Diagnosis not present

## 2017-10-27 NOTE — Progress Notes (Signed)
Patient Care Team: Molli Posey, MD as PCP - General (Obstetrics and Gynecology)  DIAGNOSIS:  Encounter Diagnosis  Name Primary?  . Neoplasm of right breast, primary tumor staging category Tis: lobular carcinoma in situ (LCIS)     SUMMARY OF ONCOLOGIC HISTORY:   Neoplasm of right breast, primary tumor staging category Tis: lobular carcinoma in situ (LCIS)   07/19/2014 Initial Biopsy    Rt.Breast Biopsy: LCIS      11/29/2014 Surgery    Right lumpectomy: LCIS with fibrocystic changes with usual ductal hyperplasia,PASH, 0.8 cm margins for LCIS      02/06/2015 -  Anti-estrogen oral therapy    tamoxifen 20 mg daily       CHIEF COMPLIANT: Follow-up on tamoxifen therapy  INTERVAL HISTORY: Christine Francis is a 57 year old with above-mentioned history of right breast cancer treated with lumpectomy and is currently on tamoxifen therapy.  She is tolerating tamoxifen reasonably well.  She has occasional hot flashes which do not seem to upset her.  She denies any lumps or nodules in the breast.  She is to have lots of anxiety which has improved significantly. She occasionally feels fatigued.  REVIEW OF SYSTEMS:   Constitutional: Denies fevers, chills or abnormal weight loss Eyes: Denies blurriness of vision Ears, nose, mouth, throat, and face: Denies mucositis or sore throat Respiratory: Denies cough, dyspnea or wheezes Cardiovascular: Denies palpitation, chest discomfort Gastrointestinal:  Denies nausea, heartburn or change in bowel habits Skin: Denies abnormal skin rashes Lymphatics: Denies new lymphadenopathy or easy bruising Neurological:Denies numbness, tingling or new weaknesses Behavioral/Psych: Mood is stable, no new changes  Extremities: No lower extremity edema Breast:  denies any pain or lumps or nodules in either breasts All other systems were reviewed with the patient and are negative.  I have reviewed the past medical history, past surgical history, social history  and family history with the patient and they are unchanged from previous note.  ALLERGIES:  is allergic to dilaudid [hydromorphone hcl]; erythromycin; penicillins; and sulfa drugs cross reactors.  MEDICATIONS:  Current Outpatient Medications  Medication Sig Dispense Refill  . albuterol (PROVENTIL HFA;VENTOLIN HFA) 108 (90 BASE) MCG/ACT inhaler Inhale into the lungs every 6 (six) hours as needed for wheezing or shortness of breath.    Marland Kitchen atenolol-chlorthalidone (TENORETIC) 50-25 MG per tablet Take 1 tablet by mouth daily.      Marland Kitchen CALCIUM PO Take 1,200 mg by mouth.     . Cholecalciferol (VITAMIN D-3 PO) Take 1,600 Int'l Units by mouth.     . Fluticasone Furoate-Vilanterol 100-25 MCG/INH AEPB Inhale into the lungs.    . mirabegron ER (MYRBETRIQ) 50 MG TB24 tablet Take 1 tablet (50 mg total) by mouth daily. 30 tablet   . montelukast (SINGULAIR) 10 MG tablet Take 10 mg by mouth at bedtime.    . Omeprazole (PRILOSEC PO) Take 20 mg by mouth daily.     . Potassium Chloride Crys CR (KLOR-CON M20 PO) Take 20 mEq by mouth.      . tamoxifen (NOLVADEX) 20 MG tablet Take 1 tablet (20 mg total) by mouth daily. 90 tablet 3   No current facility-administered medications for this visit.     PHYSICAL EXAMINATION: ECOG PERFORMANCE STATUS: 1 - Symptomatic but completely ambulatory  Vitals:   10/28/17 1027  BP: 116/72  Pulse: (!) 56  Resp: 18  Temp: 98.6 F (37 C)  SpO2: 98%   Filed Weights   10/28/17 1027  Weight: 190 lb 6.4 oz (86.4 kg)  GENERAL:alert, no distress and comfortable SKIN: skin color, texture, turgor are normal, no rashes or significant lesions EYES: normal, Conjunctiva are pink and non-injected, sclera clear OROPHARYNX:no exudate, no erythema and lips, buccal mucosa, and tongue normal  NECK: supple, thyroid normal size, non-tender, without nodularity LYMPH:  no palpable lymphadenopathy in the cervical, axillary or inguinal LUNGS: clear to auscultation and percussion with normal  breathing effort HEART: regular rate & rhythm and no murmurs and no lower extremity edema ABDOMEN:abdomen soft, non-tender and normal bowel sounds MUSCULOSKELETAL:no cyanosis of digits and no clubbing  NEURO: alert & oriented x 3 with fluent speech, no focal motor/sensory deficits EXTREMITIES: No lower extremity edema BREAST: No palpable masses or nodules in either right or left breasts. No palpable axillary supraclavicular or infraclavicular adenopathy no breast tenderness or nipple discharge. (exam performed in the presence of a chaperone)  LABORATORY DATA:  I have reviewed the data as listed CMP Latest Ref Rng & Units 03/27/2015 11/28/2014 11/14/2009  Glucose 70 - 140 mg/dl 137 119(H) 140(H)  BUN 7.0 - 26.0 mg/dL 16.2 15 13   Creatinine 0.6 - 1.1 mg/dL 0.8 0.79 0.73  Sodium 136 - 145 mEq/L 141 138 134(L)  Potassium 3.5 - 5.1 mEq/L 3.4(L) 3.0(L) 3.3(L)  Chloride 101 - 111 mmol/L - 100(L) 97  CO2 22 - 29 mEq/L 29 29 30   Calcium 8.4 - 10.4 mg/dL 9.6 9.9 9.7  Total Protein 6.4 - 8.3 g/dL 7.2 - -  Total Bilirubin 0.20 - 1.20 mg/dL 0.73 - -  Alkaline Phos 40 - 150 U/L 50 - -  AST 5 - 34 U/L 19 - -  ALT 0 - 55 U/L 25 - -    Lab Results  Component Value Date   WBC 7.2 03/27/2015   HGB 13.8 03/27/2015   HCT 41.0 03/27/2015   MCV 85.6 03/27/2015   PLT 214 03/27/2015   NEUTROABS 4.5 03/27/2015    ASSESSMENT & PLAN:  Neoplasm of right breast, primary tumor staging category Tis: lobular carcinoma in situ (LCIS) Right lumpectomy 11/29/2014: LCIS with fibrocystic changes with usual ductal hyperplasia,PASH, 0.8 cm margins for LCIS  Current treatment: Adjuvant tamoxifen started 02/07/2015 Tamoxifen toxicities: 1. Occasional hot flashes: Does not bother her at all.  Breast Cancer Surveillance: 1. Breast exam  10/28/2017: Normal 2. Mammogram April 2019 at [facility] benign  Anxiety: much improved   Return to clinic in 1 year for follow-up      No orders of the defined types were  placed in this encounter.  The patient has a good understanding of the overall plan. she agrees with it. she will call with any problems that may develop before the next visit here.   Harriette Ohara, MD 10/28/17

## 2017-10-27 NOTE — Assessment & Plan Note (Signed)
Right lumpectomy 11/29/2014: LCIS with fibrocystic changes with usual ductal hyperplasia,PASH, 0.8 cm margins for LCIS  Current treatment: Adjuvant tamoxifen started 02/07/2015 Tamoxifen toxicities: 1. Occasional hot flashes: Does not bother her at all.  Breast Cancer Surveillance: 1. Breast exam  10/28/2017: Normal 2. Mammogram April 2019 at [facility] benign  Anxiety: much improved   Return to clinic in 1 year for follow-up   

## 2017-10-28 ENCOUNTER — Inpatient Hospital Stay: Payer: BLUE CROSS/BLUE SHIELD | Attending: Hematology and Oncology | Admitting: Hematology and Oncology

## 2017-10-28 ENCOUNTER — Telehealth: Payer: Self-pay | Admitting: Hematology and Oncology

## 2017-10-28 DIAGNOSIS — D0501 Lobular carcinoma in situ of right breast: Secondary | ICD-10-CM | POA: Diagnosis not present

## 2017-10-28 DIAGNOSIS — Z79899 Other long term (current) drug therapy: Secondary | ICD-10-CM | POA: Insufficient documentation

## 2017-10-28 DIAGNOSIS — N951 Menopausal and female climacteric states: Secondary | ICD-10-CM | POA: Diagnosis not present

## 2017-10-28 MED ORDER — TAMOXIFEN CITRATE 20 MG PO TABS: 20.0000 mg | ORAL_TABLET | Freq: Every day | ORAL | 3 refills | Status: DC

## 2017-10-28 MED ORDER — MIRABEGRON ER 50 MG PO TB24: 50.0000 mg | ORAL_TABLET | Freq: Every day | ORAL | Status: DC

## 2017-10-28 NOTE — Telephone Encounter (Signed)
Gave avs and calendar ° °

## 2017-11-09 DIAGNOSIS — C44319 Basal cell carcinoma of skin of other parts of face: Secondary | ICD-10-CM | POA: Diagnosis not present

## 2017-11-10 ENCOUNTER — Ambulatory Visit (AMBULATORY_SURGERY_CENTER): Payer: Self-pay | Admitting: *Deleted

## 2017-11-10 ENCOUNTER — Other Ambulatory Visit: Payer: Self-pay

## 2017-11-10 VITALS — Ht 65.5 in | Wt 192.0 lb

## 2017-11-10 DIAGNOSIS — Z86010 Personal history of colonic polyps: Secondary | ICD-10-CM

## 2017-11-10 MED ORDER — SUPREP BOWEL PREP KIT 17.5-3.13-1.6 GM/177ML PO SOLN: 1.0000 | Freq: Once | ORAL | 0 refills | Status: AC

## 2017-11-10 NOTE — Progress Notes (Signed)
Patient denies any allergies to egg or soy products. Patient denies complications with anesthesia/sedation.  Patient denies oxygen use at home and denies diet medications. Patient was given pamphlet on colonoscopy.

## 2017-11-17 ENCOUNTER — Other Ambulatory Visit: Payer: Self-pay | Admitting: Family Medicine

## 2017-11-17 ENCOUNTER — Ambulatory Visit
Admission: RE | Admit: 2017-11-17 | Discharge: 2017-11-17 | Disposition: A | Discharge status: Home / Self Care | Payer: BLUE CROSS/BLUE SHIELD | Source: Ambulatory Visit | Attending: Family Medicine | Admitting: Family Medicine

## 2017-11-17 DIAGNOSIS — M25561 Pain in right knee: Secondary | ICD-10-CM

## 2017-11-17 DIAGNOSIS — T1490XA Injury, unspecified, initial encounter: Secondary | ICD-10-CM

## 2017-11-17 DIAGNOSIS — M25461 Effusion, right knee: Secondary | ICD-10-CM

## 2017-11-17 DIAGNOSIS — S8991XA Unspecified injury of right lower leg, initial encounter: Secondary | ICD-10-CM | POA: Diagnosis not present

## 2017-11-24 ENCOUNTER — Ambulatory Visit (AMBULATORY_SURGERY_CENTER): Payer: BLUE CROSS/BLUE SHIELD | Admitting: Internal Medicine

## 2017-11-24 ENCOUNTER — Encounter: Payer: Self-pay | Admitting: Internal Medicine

## 2017-11-24 ENCOUNTER — Other Ambulatory Visit: Payer: Self-pay

## 2017-11-24 VITALS — BP 120/57 | HR 64 | Temp 98.9°F | Resp 14 | Ht 65.5 in | Wt 192.0 lb

## 2017-11-24 DIAGNOSIS — D124 Benign neoplasm of descending colon: Secondary | ICD-10-CM | POA: Diagnosis not present

## 2017-11-24 DIAGNOSIS — K635 Polyp of colon: Secondary | ICD-10-CM | POA: Diagnosis not present

## 2017-11-24 DIAGNOSIS — K621 Rectal polyp: Secondary | ICD-10-CM | POA: Diagnosis not present

## 2017-11-24 DIAGNOSIS — D122 Benign neoplasm of ascending colon: Secondary | ICD-10-CM

## 2017-11-24 DIAGNOSIS — D126 Benign neoplasm of colon, unspecified: Secondary | ICD-10-CM | POA: Diagnosis not present

## 2017-11-24 DIAGNOSIS — D123 Benign neoplasm of transverse colon: Secondary | ICD-10-CM

## 2017-11-24 DIAGNOSIS — D128 Benign neoplasm of rectum: Secondary | ICD-10-CM | POA: Diagnosis not present

## 2017-11-24 DIAGNOSIS — Z86010 Personal history of colonic polyps: Secondary | ICD-10-CM

## 2017-11-24 DIAGNOSIS — D129 Benign neoplasm of anus and anal canal: Secondary | ICD-10-CM

## 2017-11-24 MED ORDER — SODIUM CHLORIDE 0.9 % IV SOLN: 500.0000 mL | Freq: Once | INTRAVENOUS | Status: DC

## 2017-11-24 NOTE — Op Note (Signed)
[facility] Patient Name: Christine Francis Procedure Date: 11/24/2017 9:12 AM MRN: [id number] Endoscopist: Gatha Mayer , MD Age: 57 Referring MD:  Date of Birth: 12-12-1960 Gender: Female Account #: [id number] Procedure:                Colonoscopy Indications:              Surveillance: Personal history of adenomatous                            polyps on last colonoscopy > 5 years ago Medicines:                Propofol per Anesthesia, Monitored Anesthesia Care Procedure:                Pre-Anesthesia Assessment:                           - Prior to the procedure, a History and Physical                            was performed, and patient medications and                            allergies were reviewed. The patient's tolerance of                            previous anesthesia was also reviewed. The risks                            and benefits of the procedure and the sedation                            options and risks were discussed with the patient.                            All questions were answered, and informed consent                            was obtained. Prior Anticoagulants: The patient has                            taken no previous anticoagulant or antiplatelet                            agents. ASA Grade Assessment: II - A patient with                            mild systemic disease. After reviewing the risks                            and benefits, the patient was deemed in                            satisfactory condition to undergo the procedure.  After obtaining informed consent, the colonoscope                            was passed under direct vision. Throughout the                            procedure, the patient's blood pressure, pulse, and                            oxygen saturations were monitored continuously. The                            Colonoscope was introduced through the anus and   advanced to the the cecum, identified by                            appendiceal orifice and ileocecal valve. The                            colonoscopy was performed without difficulty. The                            patient tolerated the procedure well. The quality                            of the bowel preparation was excellent. The                            ileocecal valve, appendiceal orifice, and rectum                            were photographed. The bowel preparation used was                            Miralax. Scope In: 9:21:37 AM Scope Out: 9:37:48 AM Scope Withdrawal Time: 0 hours 14 minutes 22 seconds  Total Procedure Duration: 0 hours 16 minutes 11 seconds  Findings:                 The perianal and digital rectal examinations were                            normal.                           Six sessile polyps were found in the rectum,                            descending colon, transverse colon and ascending                            colon. The polyps were small in size. These polyps                            were removed with a cold snare. Resection and  retrieval were complete. Verification of patient                            identification for the specimen was done. Estimated                            blood loss was minimal.                           The exam was otherwise without abnormality on                            direct and retroflexion views. Complications:            No immediate complications. Estimated Blood Loss:     Estimated blood loss was minimal. Impression:               - Six small (max 6 mm) polyps in the rectum, in the                            descending colon, in the transverse colon and in                            the ascending colon, removed with a cold snare.                            Resected and retrieved.                           - The examination was otherwise normal on direct                             and retroflexion views.                           - Personal history of colonic polyps. adenomas 2007                            and 2012 Recommendation:           - Patient has a contact number available for                            emergencies. The signs and symptoms of potential                            delayed complications were discussed with the                            patient. Return to normal activities tomorrow.                            Written discharge instructions were provided to the                            patient.                           -  Resume previous diet.                           - Continue present medications.                           - Repeat colonoscopy is recommended for                            surveillance. The colonoscopy date will be                            determined after pathology results from today's                            exam become available for review. Gatha Mayer, MD 11/24/2017 9:44:32 AM This report has been signed electronically.

## 2017-11-24 NOTE — Patient Instructions (Addendum)
I found and removed 6 small polyps today.  I will let you know pathology results and when to have another routine colonoscopy by mail and/or My Chart.  I appreciate the opportunity to care for you. Gatha Mayer, MD, FACG  YOU HAD AN ENDOSCOPIC PROCEDURE TODAY AT [facility]:   Refer to the procedure report that was given to you for any specific questions about what was found during the examination.  If the procedure report does not answer your questions, please call your gastroenterologist to clarify.  If you requested that your care partner not be given the details of your procedure findings, then the procedure report has been included in a sealed envelope for you to review at your convenience later.  YOU SHOULD EXPECT: Some feelings of bloating in the abdomen. Passage of more gas than usual.  Walking can help get rid of the air that was put into your GI tract during the procedure and reduce the bloating. If you had a lower endoscopy (such as a colonoscopy or flexible sigmoidoscopy) you may notice spotting of blood in your stool or on the toilet paper. If you underwent a bowel prep for your procedure, you may not have a normal bowel movement for a few days.  Please Note:  You might notice some irritation and congestion in your nose or some drainage.  This is from the oxygen used during your procedure.  There is no need for concern and it should clear up in a day or so.  SYMPTOMS TO REPORT IMMEDIATELY:   Following lower endoscopy (colonoscopy or flexible sigmoidoscopy):  Excessive amounts of blood in the stool  Significant tenderness or worsening of abdominal pains  Swelling of the abdomen that is new, acute  Fever of 100F or higher   Following upper endoscopy (EGD)  Vomiting of blood or coffee ground material  New chest pain or pain under the shoulder blades  Painful or persistently difficult swallowing  New shortness of breath  Fever of 100F or  higher  Black, tarry-looking stools  For urgent or emergent issues, a gastroenterologist can be reached at any hour by calling [phone].   DIET:  We do recommend a small meal at first, but then you may proceed to your regular diet.  Drink plenty of fluids but you should avoid alcoholic beverages for 24 hours.  ACTIVITY:  You should plan to take it easy for the rest of today and you should NOT DRIVE or use heavy machinery until tomorrow (because of the sedation medicines used during the test).    FOLLOW UP: Our staff will call the number listed on your records the next business day following your procedure to check on you and address any questions or concerns that you may have regarding the information given to you following your procedure. If we do not reach you, we will leave a message.  However, if you are feeling well and you are not experiencing any problems, there is no need to return our call.  We will assume that you have returned to your regular daily activities without incident.  If any biopsies were taken you will be contacted by phone or by letter within the next 1-3 weeks.  Please call us at [phone] if you have not heard about the biopsies in 3 weeks.    SIGNATURES/CONFIDENTIALITY: You and/or your care partner have signed paperwork which will be entered into your electronic medical record.  These signatures attest to the fact  that that the information above on your After Visit Summary has been reviewed and is understood.  Full responsibility of the confidentiality of this discharge information lies with you and/or your care-partner.  Polyp information given.

## 2017-11-24 NOTE — Progress Notes (Signed)
Pt's states no medical or surgical changes since previsit or office visit. 

## 2017-11-24 NOTE — Progress Notes (Signed)
Report to PACU, RN, vss, BBS= Clear.  

## 2017-11-24 NOTE — Progress Notes (Signed)
Called to room to assist during endoscopic procedure.  Patient ID and intended procedure confirmed with present staff. Received instructions for my participation in the procedure from the performing physician.  

## 2017-11-25 ENCOUNTER — Telehealth: Payer: Self-pay | Admitting: *Deleted

## 2017-11-25 NOTE — Telephone Encounter (Signed)
  Follow up Call-  Call back number 11/24/2017  Post procedure Call Back phone  # [phone]  Permission to leave phone message Yes  Some recent data might be hidden     Patient questions:  Do you have a fever, pain , or abdominal swelling? No. Pain Score  0 *  Have you tolerated food without any problems? Yes.    Have you been able to return to your normal activities? Yes.    Do you have any questions about your discharge instructions: Diet   No. Medications  No. Follow up visit  No.  Do you have questions or concerns about your Care? No.  Actions: * If pain score is 4 or above: No action needed, pain <4.

## 2017-11-28 ENCOUNTER — Other Ambulatory Visit: Payer: Self-pay | Admitting: Family Medicine

## 2017-11-28 DIAGNOSIS — M25561 Pain in right knee: Secondary | ICD-10-CM

## 2017-12-04 ENCOUNTER — Encounter: Payer: Self-pay | Admitting: Internal Medicine

## 2017-12-04 DIAGNOSIS — Z86010 Personal history of colonic polyps: Secondary | ICD-10-CM

## 2017-12-04 NOTE — Progress Notes (Signed)
6 adenomas Recall 2022

## 2017-12-18 ENCOUNTER — Ambulatory Visit
Admission: RE | Admit: 2017-12-18 | Discharge: 2017-12-18 | Disposition: A | Discharge status: Home / Self Care | Payer: BLUE CROSS/BLUE SHIELD | Source: Ambulatory Visit | Attending: Family Medicine | Admitting: Family Medicine

## 2017-12-18 DIAGNOSIS — S82831A Other fracture of upper and lower end of right fibula, initial encounter for closed fracture: Secondary | ICD-10-CM | POA: Diagnosis not present

## 2017-12-18 DIAGNOSIS — M25561 Pain in right knee: Secondary | ICD-10-CM

## 2017-12-23 ENCOUNTER — Encounter (INDEPENDENT_AMBULATORY_CARE_PROVIDER_SITE_OTHER): Payer: Self-pay | Admitting: Orthopaedic Surgery

## 2017-12-23 ENCOUNTER — Ambulatory Visit (INDEPENDENT_AMBULATORY_CARE_PROVIDER_SITE_OTHER): Payer: BLUE CROSS/BLUE SHIELD | Admitting: Orthopaedic Surgery

## 2017-12-23 ENCOUNTER — Ambulatory Visit (INDEPENDENT_AMBULATORY_CARE_PROVIDER_SITE_OTHER): Payer: BLUE CROSS/BLUE SHIELD

## 2017-12-23 DIAGNOSIS — M25571 Pain in right ankle and joints of right foot: Secondary | ICD-10-CM

## 2017-12-23 DIAGNOSIS — G8929 Other chronic pain: Secondary | ICD-10-CM | POA: Insufficient documentation

## 2017-12-23 DIAGNOSIS — M25561 Pain in right knee: Secondary | ICD-10-CM | POA: Diagnosis not present

## 2017-12-23 MED ORDER — TRAMADOL HCL 50 MG PO TABS: ORAL_TABLET | ORAL | 0 refills | Status: DC

## 2017-12-23 MED ORDER — LIDOCAINE HCL 1 % IJ SOLN
2.0000 mL | INTRAMUSCULAR | Status: AC | PRN
  Administered 2017-12-23: 2 mL

## 2017-12-23 MED ORDER — BUPIVACAINE HCL 0.25 % IJ SOLN
2.0000 mL | INTRAMUSCULAR | Status: AC | PRN
  Administered 2017-12-23: 2 mL via INTRA_ARTICULAR

## 2017-12-23 MED ORDER — NAPROXEN 500 MG PO TABS: 500.0000 mg | ORAL_TABLET | Freq: Two times a day (BID) | ORAL | 2 refills | Status: DC | PRN

## 2017-12-23 NOTE — Progress Notes (Signed)
Office Visit Note   Patient: Christine Francis           Date of Birth: Dec 08, 1960           MRN: [id number] Visit Date: 12/23/2017              Requested by: Molli Posey, [address], [place] [address] PCP: Molli Posey, MD   Assessment & Plan: Visit Diagnoses:  1. Chronic pain of right knee   2. Pain in right ankle and joints of right foot     Plan: Right knee nondisplaced proximal fibula fracture.  This will be amenable to conservative treatment.  The patient will be weightbearing as tolerated.  She will rest, ice and elevate as much as possible over the next several weeks.  We will aspirate her right knee joint today to try and give her some relief.  She will follow-up with [place] in 4 to 6 weeks time for recheck.  Follow-Up Instructions: Return in about 5 weeks (around 01/27/2018).   Orders:  Orders Placed This Encounter  Procedures  . Large Joint Inj: R knee  . XR Ankle Complete Right   Meds ordered this encounter  Medications  . naproxen (NAPROSYN) 500 MG tablet    Sig: Take 1 tablet (500 mg total) by mouth 2 (two) times daily as needed.    Dispense:  60 tablet    Refill:  2      Procedures: Large Joint Inj: R knee on 12/23/2017 4:43 PM Indications: pain and joint swelling Details: 22 G needle, anterolateral approach Medications: 2 mL lidocaine 1 %; 2 mL bupivacaine 0.25 %      Clinical Data: No additional findings.   Subjective: Chief Complaint  Patient presents with  . Right Knee - Pain    HPI patient is a pleasant 57 year old female who presents to our clinic today with an injury to her right knee.  On 11/16/2017, she was in her backyard as she was locked out of her house and needed to scale a 6 foot fence.  When she went to step down after she had gotten to the top, she awkwardly landed on her right foot.  He had immediate pain to the right lateral knee radiating down into the right foot.  After a few minutes, she was able  to put weight on her knee.  She was seen at her works med center where x-rays were obtained and negative for fracture of the knee.  She had intermittent pain following this for a few weeks.  She was on a beach trip with her friends a few weekends ago when she went to get inside of a [employer] putting all of her weight on her right leg.  She had immediate pain which nearly brought her to the ground.  She is unable to weight-bear the remainder of that day.  An MRI of the right knee was then ordered by Dr. Marily Memos at her med center which showed a nondisplaced fracture to the proximal fibula as well as a posterior horn medial meniscus tear and mucoid degeneration of the ACL.  She has continued to work throughout this week but does have pain at the end of the day.  She comes in today for further evaluation treatment recommendation.  Review of Systems as detailed in HPI.  All others reviewed and are negative.   Objective: Vital Signs: There were no vitals taken for this visit.  Physical Exam well-developed and well-nourished female  no acute distress.  Alert and oriented x3.  Ortho Exam examination of the right knee reveals 1+ effusion.  Range of motion 0 to 90 degrees.  Moderate tenderness over the fibular head.  Minimal joint line tenderness.  She is neurovascularly intact distally.  Normal exam of the right ankle.  Specialty Comments:  No specialty comments available.  Imaging: Xr Ankle Complete Right  Result Date: 12/23/2017 Negative for structural abnormality    PMFS History: Patient Active Problem List   Diagnosis Date Noted  . Pain in right ankle and joints of right foot 12/23/2017  . Chronic pain of right knee 12/23/2017  . Anxiety 03/27/2015  . Neoplasm of right breast, primary tumor staging category Tis: lobular carcinoma in situ (LCIS) 09/04/2014  . Fibrocystic breast changes 07/04/2011  . Atypical hyperplasia of breast excised R Breast may 2011 bc pills stopped 07/04/2011  . Personal  history of colonic adenomas 11/17/2005   Past Medical History:  Diagnosis Date  . Allergy    allergy shots weekly  . Anxiety    no meds currently  . Asthma   . Cancer ([place])    skin- basil cancer face, chest and left leg  . GERD (gastroesophageal reflux disease)   . Hyperlipidemia    diet controlled, no meds  . Hypertension   . IBS (irritable bowel syndrome)    no problems currently  . Personal history of colonic adenomas 11/17/2005   11/17/2005 - 3 adenomas - one  A TV adenoma was 12 mm (max)    Family History  Problem Relation Age of Onset  . Hypertension Father   . Cancer Father        melanoma/skin cancer  . Colon polyps Father   . Colon cancer Neg Hx   . Rectal cancer Neg Hx   . Stomach cancer Neg Hx     Past Surgical History:  Procedure Laterality Date  . BIOPSY BREAST  2000   right; benign  . BREAST BIOPSY  2012   right breast; pre cancerous  . BREAST LUMPECTOMY Right 04/1999, 11/2009  . BREAST LUMPECTOMY WITH RADIOACTIVE SEED LOCALIZATION Right 11/29/2014   Procedure: BREAST LUMPECTOMY WITH RADIOACTIVE SEED LOCALIZATION;  Surgeon: Donnie Mesa, MD;  Location: [facility];  Service: General;  Laterality: Right;  . BUNIONECTOMY  1986   bilateral  . cervical cryosurgery  12/2013  . COLONOSCOPY     x 2  . DIAGNOSTIC LAPAROSCOPY    . laproscopy  1998  . PARATHYROIDECTOMY  2005  . WISDOM TOOTH EXTRACTION     Social History   Occupational History  . 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 Smoking status: Never Smoker  . Smokeless tobacco: Never Used  Substance and Sexual Activity  . Alcohol use: Yes    Alcohol/week: 0.6 oz    Types: 1 Glasses of wine per week  . Drug use: No  . Sexual activity: Yes    Birth control/protection: Post-menopausal

## 2018-01-20 ENCOUNTER — Ambulatory Visit (INDEPENDENT_AMBULATORY_CARE_PROVIDER_SITE_OTHER): Payer: BLUE CROSS/BLUE SHIELD | Admitting: Orthopaedic Surgery

## 2018-01-27 ENCOUNTER — Ambulatory Visit (INDEPENDENT_AMBULATORY_CARE_PROVIDER_SITE_OTHER): Payer: BLUE CROSS/BLUE SHIELD | Admitting: Orthopaedic Surgery

## 2018-01-27 ENCOUNTER — Encounter (INDEPENDENT_AMBULATORY_CARE_PROVIDER_SITE_OTHER): Payer: Self-pay | Admitting: Orthopaedic Surgery

## 2018-01-27 DIAGNOSIS — M25561 Pain in right knee: Secondary | ICD-10-CM | POA: Diagnosis not present

## 2018-01-27 DIAGNOSIS — G8929 Other chronic pain: Secondary | ICD-10-CM | POA: Diagnosis not present

## 2018-01-27 NOTE — Progress Notes (Signed)
Office Visit Note   Patient: Christine Francis           Date of Birth: 06-Aug-1960           MRN: [id number] Visit Date: 01/27/2018              Requested by: Molli Posey, [address], [place] [address] PCP: Molli Posey, MD   Assessment & Plan: Visit Diagnoses:  1. Chronic pain of right knee     Plan: Impression is improved right knee pain.  At this point patient may continue to increase activity as tolerated.  I am very happy that she is doing better at this point and hope she continues to improve.  She knows to give [place] a call or return anytime if she has any worsening.  Otherwise I will see her back as needed.  Follow-Up Instructions: Return if symptoms worsen or fail to improve.   Orders:  No orders of the defined types were placed in this encounter.  No orders of the defined types were placed in this encounter.     Procedures: No procedures performed   Clinical Data: No additional findings.   Subjective: Chief Complaint  Patient presents with  . Right Ankle - Pain, Follow-up  . Right Knee - Pain, Follow-up    Patient follows up today for right knee pain.  She is doing better.  For the last week she has improved significantly.  She is back to work.   Review of Systems   Objective: Vital Signs: There were no vitals taken for this visit.  Physical Exam  Ortho Exam Right knee exam shows effusion.  She has no tenderness of the fibular head.  She is ambulating well without a limp. Specialty Comments:  No specialty comments available.  Imaging: No results found.   PMFS History: Patient Active Problem List   Diagnosis Date Noted  . Pain in right ankle and joints of right foot 12/23/2017  . Chronic pain of right knee 12/23/2017  . Anxiety 03/27/2015  . Neoplasm of right breast, primary tumor staging category Tis: lobular carcinoma in situ (LCIS) 09/04/2014  . Fibrocystic breast changes 07/04/2011  . Atypical  hyperplasia of breast excised R Breast may 2011 bc pills stopped 07/04/2011  . Personal history of colonic adenomas 11/17/2005   Past Medical History:  Diagnosis Date  . Allergy    allergy shots weekly  . Anxiety    no meds currently  . Asthma   . Cancer ([place])    skin- basil cancer face, chest and left leg  . GERD (gastroesophageal reflux disease)   . Hyperlipidemia    diet controlled, no meds  . Hypertension   . IBS (irritable bowel syndrome)    no problems currently  . Personal history of colonic adenomas 11/17/2005   11/17/2005 - 3 adenomas - one  A TV adenoma was 12 mm (max)    Family History  Problem Relation Age of Onset  . Hypertension Father   . Cancer Father        melanoma/skin cancer  . Colon polyps Father   . Colon cancer Neg Hx   . Rectal cancer Neg Hx   . Stomach cancer Neg Hx     Past Surgical History:  Procedure Laterality Date  . BIOPSY BREAST  2000   right; benign  . BREAST BIOPSY  2012   right breast; pre cancerous  . BREAST LUMPECTOMY Right 04/1999, 11/2009  . BREAST  LUMPECTOMY WITH RADIOACTIVE SEED LOCALIZATION Right 11/29/2014   Procedure: BREAST LUMPECTOMY WITH RADIOACTIVE SEED LOCALIZATION;  Surgeon: Donnie Mesa, MD;  Location: [facility];  Service: General;  Laterality: Right;  . BUNIONECTOMY  1986   bilateral  . cervical cryosurgery  12/2013  . COLONOSCOPY     x 2  . DIAGNOSTIC LAPAROSCOPY    . laproscopy  1998  . PARATHYROIDECTOMY  2005  . WISDOM TOOTH EXTRACTION     Social History   Occupational History  . 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 Smoking status: Never Smoker  . Smokeless tobacco: Never Used  Substance and Sexual Activity  . Alcohol use: Yes    Alcohol/week: 0.6 oz    Types: 1 Glasses of wine per week  . Drug use: No  . Sexual activity: Yes    Birth control/protection: Post-menopausal

## 2018-04-27 DIAGNOSIS — L249 Irritant contact dermatitis, unspecified cause: Secondary | ICD-10-CM | POA: Diagnosis not present

## 2018-04-27 DIAGNOSIS — L820 Inflamed seborrheic keratosis: Secondary | ICD-10-CM | POA: Diagnosis not present

## 2018-04-27 DIAGNOSIS — Z85828 Personal history of other malignant neoplasm of skin: Secondary | ICD-10-CM | POA: Diagnosis not present

## 2018-07-09 DIAGNOSIS — M4124 Other idiopathic scoliosis, thoracic region: Secondary | ICD-10-CM | POA: Diagnosis not present

## 2018-07-09 DIAGNOSIS — M62830 Muscle spasm of back: Secondary | ICD-10-CM | POA: Diagnosis not present

## 2018-07-09 DIAGNOSIS — M9902 Segmental and somatic dysfunction of thoracic region: Secondary | ICD-10-CM | POA: Diagnosis not present

## 2018-07-09 DIAGNOSIS — M546 Pain in thoracic spine: Secondary | ICD-10-CM | POA: Diagnosis not present

## 2018-07-10 DIAGNOSIS — M62830 Muscle spasm of back: Secondary | ICD-10-CM | POA: Diagnosis not present

## 2018-07-10 DIAGNOSIS — M546 Pain in thoracic spine: Secondary | ICD-10-CM | POA: Diagnosis not present

## 2018-07-10 DIAGNOSIS — M4124 Other idiopathic scoliosis, thoracic region: Secondary | ICD-10-CM | POA: Diagnosis not present

## 2018-07-10 DIAGNOSIS — M9902 Segmental and somatic dysfunction of thoracic region: Secondary | ICD-10-CM | POA: Diagnosis not present

## 2018-07-12 DIAGNOSIS — M9902 Segmental and somatic dysfunction of thoracic region: Secondary | ICD-10-CM | POA: Diagnosis not present

## 2018-07-12 DIAGNOSIS — M62830 Muscle spasm of back: Secondary | ICD-10-CM | POA: Diagnosis not present

## 2018-07-12 DIAGNOSIS — M4124 Other idiopathic scoliosis, thoracic region: Secondary | ICD-10-CM | POA: Diagnosis not present

## 2018-07-12 DIAGNOSIS — M546 Pain in thoracic spine: Secondary | ICD-10-CM | POA: Diagnosis not present

## 2018-08-11 DIAGNOSIS — H524 Presbyopia: Secondary | ICD-10-CM | POA: Diagnosis not present

## 2018-08-11 DIAGNOSIS — H538 Other visual disturbances: Secondary | ICD-10-CM | POA: Diagnosis not present

## 2018-08-11 DIAGNOSIS — H35432 Paving stone degeneration of retina, left eye: Secondary | ICD-10-CM | POA: Diagnosis not present

## 2018-08-11 DIAGNOSIS — H5213 Myopia, bilateral: Secondary | ICD-10-CM | POA: Diagnosis not present

## 2018-10-19 NOTE — Assessment & Plan Note (Signed)
Right lumpectomy 11/29/2014: LCIS with fibrocystic changes with usual ductal hyperplasia,PASH, 0.8 cm margins for LCIS  Current treatment: Adjuvant tamoxifen started 02/07/2015 Tamoxifen toxicities: 1. Occasional hot flashes: Does not bother her at all.  Breast Cancer Surveillance: 1. Breast exam  10/28/2017: Normal 2. Mammogram April 2019 at [facility] benign  Anxiety: much improved   Return to clinic in 1 year for follow-up

## 2018-10-25 ENCOUNTER — Telehealth: Payer: Self-pay | Admitting: Hematology and Oncology

## 2018-10-25 NOTE — Telephone Encounter (Signed)
Called patient regarding upcoming [facility] appointment, left the patient a voicemail.

## 2018-10-28 ENCOUNTER — Telehealth: Payer: Self-pay | Admitting: Hematology and Oncology

## 2018-10-28 NOTE — Telephone Encounter (Signed)
Called patient per 4/16 sch message - unable to reach patient . lleft message for patient to call back to r/s

## 2018-10-28 NOTE — Progress Notes (Signed)
HEMATOLOGY-ONCOLOGY TELEPHONE VISIT PROGRESS NOTE I connected with Ms.Linch on 10/29/18 at 10:00 AM EDT by telephone and verified that I am speaking with the correct person using two identifiers.  I discussed the limitations, risks, security and privacy concerns of performing an evaluation and management service by telephone and the availability of in person appointments.  I also discussed with the patient that there may be a patient responsible charge related to this service. The patient expressed understanding and agreed to proceed.   CHIEF COMPLIANT: Follow-up on tamoxifen therapy  INTERVAL HISTORY: Christine Francis is a 58 y.o. female with above-mentioned history of right breast cancer treated with lumpectomy and is currently on tamoxifen therapy. I last saw her a year ago.     Neoplasm of right breast, primary tumor staging category Tis: lobular carcinoma in situ (LCIS)   07/19/2014 Initial Biopsy    Rt.Breast Biopsy: LCIS    11/29/2014 Surgery    Right lumpectomy: LCIS with fibrocystic changes with usual ductal hyperplasia,PASH, 0.8 cm margins for LCIS    02/06/2015 -  Anti-estrogen oral therapy    tamoxifen 20 mg daily    REVIEW OF SYSTEMS:   Constitutional: Denies fevers, chills or abnormal weight loss Eyes: Denies blurriness of vision Ears, nose, mouth, throat, and face: Denies mucositis or sore throat Respiratory: Denies cough, dyspnea or wheezes Cardiovascular: Denies palpitation, chest discomfort Gastrointestinal:  Denies nausea, heartburn or change in bowel habits Skin: Denies abnormal skin rashes Lymphatics: Denies new lymphadenopathy or easy bruising Neurological:Denies numbness, tingling or new weaknesses Behavioral/Psych: Mood is stable, no new changes  Extremities: No lower extremity edema Breast:  denies any pain or lumps or nodules in either breasts All other systems were reviewed with the patient and are negative.  Observations/Objective:    Assessment Plan:   Neoplasm of right breast, primary tumor staging category Tis: lobular carcinoma in situ (LCIS) Right lumpectomy 11/29/2014: LCIS with fibrocystic changes with usual ductal hyperplasia,PASH, 0.8 cm margins for LCIS  Current treatment: Adjuvant tamoxifen started 02/07/2015 Tamoxifen toxicities: 1. Occasional hot flashes: Does not bother her at all.  Breast Cancer Surveillance: 1. Breast exam  10/28/2017: Normal 2. Mammogram April 2019 at [facility] benign  Anxiety: much improved   Return to clinic in 1 year for follow-up   I discussed the assessment and treatment plan with the patient. The patient was provided an opportunity to ask questions and all were answered. The patient agreed with the plan and demonstrated an understanding of the instructions. The patient was advised to call back or seek an in-person evaluation if the symptoms worsen or if the condition fails to improve as anticipated.   I provided 11 minutes of non-face-to-face time during this encounter. Harriette Ohara, MD

## 2018-10-29 ENCOUNTER — Inpatient Hospital Stay: Payer: BLUE CROSS/BLUE SHIELD | Attending: Hematology and Oncology | Admitting: Hematology and Oncology

## 2018-10-29 DIAGNOSIS — D0501 Lobular carcinoma in situ of right breast: Secondary | ICD-10-CM

## 2018-10-29 DIAGNOSIS — Z79810 Long term (current) use of selective estrogen receptor modulators (SERMs): Secondary | ICD-10-CM

## 2018-10-29 MED ORDER — TAMOXIFEN CITRATE 20 MG PO TABS: 20.0000 mg | ORAL_TABLET | Freq: Every day | ORAL | 3 refills | Status: DC

## 2018-11-16 ENCOUNTER — Other Ambulatory Visit: Payer: Self-pay | Admitting: Hematology and Oncology

## 2018-11-30 ENCOUNTER — Encounter: Payer: Self-pay | Admitting: Hematology and Oncology

## 2018-11-30 DIAGNOSIS — N6324 Unspecified lump in the left breast, lower inner quadrant: Secondary | ICD-10-CM | POA: Diagnosis not present

## 2018-11-30 DIAGNOSIS — Z853 Personal history of malignant neoplasm of breast: Secondary | ICD-10-CM | POA: Diagnosis not present

## 2018-11-30 DIAGNOSIS — N6323 Unspecified lump in the left breast, lower outer quadrant: Secondary | ICD-10-CM | POA: Diagnosis not present

## 2018-12-08 ENCOUNTER — Other Ambulatory Visit: Payer: Self-pay | Admitting: Radiology

## 2018-12-08 DIAGNOSIS — Z6831 Body mass index (BMI) 31.0-31.9, adult: Secondary | ICD-10-CM | POA: Diagnosis not present

## 2018-12-08 DIAGNOSIS — C50512 Malignant neoplasm of lower-outer quadrant of left female breast: Secondary | ICD-10-CM | POA: Diagnosis not present

## 2018-12-08 DIAGNOSIS — Z853 Personal history of malignant neoplasm of breast: Secondary | ICD-10-CM | POA: Diagnosis not present

## 2018-12-08 DIAGNOSIS — Z01419 Encounter for gynecological examination (general) (routine) without abnormal findings: Secondary | ICD-10-CM | POA: Diagnosis not present

## 2018-12-08 DIAGNOSIS — Z86010 Personal history of colonic polyps: Secondary | ICD-10-CM | POA: Diagnosis not present

## 2018-12-08 DIAGNOSIS — N6323 Unspecified lump in the left breast, lower outer quadrant: Secondary | ICD-10-CM | POA: Diagnosis not present

## 2018-12-21 DIAGNOSIS — R1031 Right lower quadrant pain: Secondary | ICD-10-CM | POA: Diagnosis not present

## 2018-12-22 ENCOUNTER — Other Ambulatory Visit: Payer: Self-pay | Admitting: Surgery

## 2018-12-22 DIAGNOSIS — C50912 Malignant neoplasm of unspecified site of left female breast: Secondary | ICD-10-CM

## 2018-12-23 ENCOUNTER — Telehealth: Payer: Self-pay | Admitting: Hematology and Oncology

## 2018-12-23 NOTE — Progress Notes (Signed)
HEMATOLOGY-ONCOLOGY DOXIMITY VISIT PROGRESS NOTE  I connected with Christine Francis on 12/24/2018 at 10:00 AM EDT by Doximity video conference and verified that I am speaking with the correct person using two identifiers.  I discussed the limitations, risks, security and privacy concerns of performing an evaluation and management service by Doximity and the availability of in person appointments.  I also discussed with the patient that there may be a patient responsible charge related to this service. The patient expressed understanding and agreed to proceed.  Patient's Location: Home Physician Location: Clinic  CHIEF COMPLIANT: Follow-up on tamoxifen therapy  INTERVAL HISTORY: Christine Francis is a 58 y.o. female with above-mentioned history of right breast cancer in 2016 treated with right lumpectomy and tamoxifen therapy. Diagnostic mammogram on 11/30/18 showed a 2cm mass in the left breast suggestive of malignancy. US showed the mass to be 2.5cm. Biopsy on 12/08/18 confirmed invasive mammary carcinoma and invasive mammary carcinoma in situ, grade 2, HER2 negative (1+), ER 90%, PR 30%, Ki67 15%. She presents over Doximity today for follow-up to discuss treatment options.   Oncology History  Neoplasm of right breast, primary tumor staging category Tis: lobular carcinoma in situ (LCIS)  07/19/2014 Initial Biopsy   Rt.Breast Biopsy: LCIS   11/29/2014 Surgery   Right lumpectomy: LCIS with fibrocystic changes with usual ductal hyperplasia,PASH, 0.8 cm margins for LCIS   02/06/2015 -  Anti-estrogen oral therapy   tamoxifen 20 mg daily   12/08/2018 Relapse/Recurrence   Left breast 2 cm mass at 7 o clock position: ILC grade 2, ER 90%, PR 30%, Her 2 neg, Ki 67: 15% T1CN0 stage 1A     REVIEW OF SYSTEMS:   Constitutional: Denies fevers, chills or abnormal weight loss Eyes: Denies blurriness of vision Ears, nose, mouth, throat, and face: Denies mucositis or sore throat Respiratory: Denies cough,  dyspnea or wheezes Cardiovascular: Denies palpitation, chest discomfort Gastrointestinal:  Denies nausea, heartburn or change in bowel habits Skin: Denies abnormal skin rashes Lymphatics: Denies new lymphadenopathy or easy bruising Neurological:Denies numbness, tingling or new weaknesses Behavioral/Psych: Severe anxiety and depression from the new diagnosis Extremities: No lower extremity edema Breast: denies any pain or lumps or nodules in either breasts All other systems were reviewed with the patient and are negative.  Observations/Objective:  There were no vitals filed for this visit. There is no height or weight on file to calculate BMI.  I have reviewed the data as listed CMP Latest Ref Rng & Units 03/27/2015 11/28/2014 11/14/2009  Glucose 70 - 140 mg/dl 137 119(H) 140(H)  BUN 7.0 - 26.0 mg/dL 16.2 15 13   Creatinine 0.6 - 1.1 mg/dL 0.8 0.79 0.73  Sodium 136 - 145 mEq/L 141 138 134(L)  Potassium 3.5 - 5.1 mEq/L 3.4(L) 3.0(L) 3.3(L)  Chloride 101 - 111 mmol/L - 100(L) 97  CO2 22 - 29 mEq/L 29 29 30   Calcium 8.4 - 10.4 mg/dL 9.6 9.9 9.7  Total Protein 6.4 - 8.3 g/dL 7.2 - -  Total Bilirubin 0.20 - 1.20 mg/dL 0.73 - -  Alkaline Phos 40 - 150 U/L 50 - -  AST 5 - 34 U/L 19 - -  ALT 0 - 55 U/L 25 - -    Lab Results  Component Value Date   WBC 7.2 03/27/2015   HGB 13.8 03/27/2015   HCT 41.0 03/27/2015   MCV 85.6 03/27/2015   PLT 214 03/27/2015   NEUTROABS 4.5 03/27/2015      Assessment Plan:  Neoplasm of right breast,  primary tumor staging category Tis: lobular carcinoma in situ (LCIS) Right lumpectomy 11/29/2014: LCIS with fibrocystic changes with usual ductal hyperplasia,PASH, 0.8 cm margins for LCIS, took Tamoxifen from 02/07/15-12/24/18  12/08/18: Left breast 2 cm mass at 7 o clock position: ILC grade 2, ER 90%, PR 30%, Her 2 neg, Ki 67: 15% T1CN0 stage 1A  Anxiety: sent prescription for Xanax  Treatment Plan:  1. Bilateral mastectomies (patient preference) 2. Oncotype  Dx 3. Adj Anastrozole  She did genetic testing  Return to clinic after surgery Doximity video visit   I discussed the assessment and treatment plan with the patient. The patient was provided an opportunity to ask questions and all were answered. The patient agreed with the plan and demonstrated an understanding of the instructions. The patient was advised to call back or seek an in-person evaluation if the symptoms worsen or if the condition fails to improve as anticipated.   I provided 15 minutes of face-to-face Doximity time during this encounter.    Rulon Eisenmenger, MD 12/24/2018   I, Molly Dorshimer, am acting as scribe for Nicholas Lose, MD.  I have reviewed the above documentation for accuracy and completeness, and I agree with the above.

## 2018-12-23 NOTE — Telephone Encounter (Signed)
Left message to verify webex appt for pre reg

## 2018-12-24 ENCOUNTER — Telehealth: Payer: Self-pay | Admitting: *Deleted

## 2018-12-24 ENCOUNTER — Inpatient Hospital Stay: Payer: BC Managed Care – PPO | Attending: Hematology and Oncology | Admitting: Hematology and Oncology

## 2018-12-24 DIAGNOSIS — D0501 Lobular carcinoma in situ of right breast: Secondary | ICD-10-CM

## 2018-12-24 DIAGNOSIS — Z170 Estrogen receptor positive status [ER+]: Secondary | ICD-10-CM

## 2018-12-24 DIAGNOSIS — Z79810 Long term (current) use of selective estrogen receptor modulators (SERMs): Secondary | ICD-10-CM | POA: Diagnosis not present

## 2018-12-24 DIAGNOSIS — Z9013 Acquired absence of bilateral breasts and nipples: Secondary | ICD-10-CM | POA: Diagnosis not present

## 2018-12-24 MED ORDER — ALPRAZOLAM 0.25 MG PO TABS: 0.2500 mg | ORAL_TABLET | Freq: Three times a day (TID) | ORAL | 1 refills | Status: DC | PRN

## 2018-12-24 NOTE — Telephone Encounter (Signed)
Spoke with patient to give navigation resources.  She has been very anxious since receiving her diagnosis and has not slept.  She now feels much better after discussing with Dr. Lindi Adie today.  Gave emotional support.  Contact information given and encouraged her to call with any needs or concern.

## 2018-12-24 NOTE — Assessment & Plan Note (Addendum)
Right lumpectomy 11/29/2014: LCIS with fibrocystic changes with usual ductal hyperplasia,PASH, 0.8 cm margins for LCIS, took Tamoxifen from 02/07/15-12/24/18  12/08/18: Left breast 2 cm mass at 7 o clock position: ILC grade 2, ER 90%, PR 30%, Her 2 neg, Ki 67: 15% T1CN0 stage 1A  Anxiety: sent prescription for Xanax  Treatment Plan:  1. Bilateral mastectomies (patient preference) 2. Oncotype Dx 3. Adj Anastrozole  She did genetic testing  Return to clinic after surgery Doximity video visit

## 2018-12-27 ENCOUNTER — Ambulatory Visit: Payer: Self-pay | Admitting: Surgery

## 2018-12-27 ENCOUNTER — Other Ambulatory Visit: Payer: Self-pay | Admitting: Surgery

## 2018-12-27 DIAGNOSIS — C50912 Malignant neoplasm of unspecified site of left female breast: Secondary | ICD-10-CM | POA: Diagnosis not present

## 2018-12-27 NOTE — H&P (Signed)
History of Present Illness Christine Francis. Jewels Langone MD; 12/27/2018 7:49 PM) The patient is a 58 year old female who presents with breast cancer. Referred by Dr. Elige Radon [facility]) for left breast cancer PCP - [place]  This is a 58 year old female who is s/p three previous right breast lumpectomies. The first was in the 2000's and was excision of a fibroadenoma by Dr. Truitt Leep. In 2011, Dr. Rise Patience performed needle-localized lumpectomy in the [place], which showed ADH in a radial scar, LCIS. In 2016, she had another right upper outer quadrant breast mass, which I excised using radioactive seed localization. This showed LCIS with fibrocystic changes and UDH. She has been on Tamoxifen since that time.   Recently, she developed a palpable mass in the lower left breast. Mammogram showed 2 cm mass in the lower left breast. [place] measured this at 2.5 cm. Biopsy showed invasive lobular carcinoma, grade 2, Her2 negative, ER/PR +, Ki57 15%. Ultrasound showed no suspicious nodes.   The patient also recent had some atypia on a cervical Pap smear and is scheduled for endometrial biopsy tomorrow by Dr. Antrell Tipler Saras. She remains on Tamoxifen. The patient is very, very anxious and refuses to undergo a MRI. She comes in today for surgical evaluation.   Diagnostic mammogram 11/30/18 - 2 cm irregular spiculated mass at 7:00.  Left breast US showed 2.5 cm lobulated mass at 5:00 with negative axillae. The patient had some type of genetic testing panel sent at Dr. Delanna Ahmadi office but the results are not yet available  She is not able to tolerate MRI due to extreme claustrophobia. She has decided to have bilateral mastectomies with reconstructions followed by Oncotype and Arimidex.She has an appointment with Dr. Iran Planas next week.   Allergies [facility], RMA; 12/27/2018 3:18 PM) Penicillins Dilaudid *ANALGESICS - OPIOID* Sulfa Antibiotics Allergies  Reconciled  Medication History (Jacqueline Haggett, RMA; 12/27/2018 3:24 PM) Tamoxifen Citrate (20MG Tablet, Oral) Active. Atenolol-Chlorthalidone (50-25MG Tablet, Oral) Active. Klor-Con M20 [facility] Tablet ER, Oral) Active. Montelukast Sodium (10MG Tablet, Oral) Active. Calcium "900" w/D (Oral) Active. Cholecalciferol Active. PriLOSEC OTC (20MG Tablet DR, Oral) Active. EpiPen 2-Pak (0.3MG/0.3ML Soln Auto-inj, Injection) Active. Breo Ellipta (100-25MCG/INH Aero Pow Br Act, Inhalation) Active. ALPRAZolam (0.25MG Tablet, Oral) Active. Medications Reconciled    Vitals (Jacqueline Haggett RMA; 12/27/2018 3:24 PM) 12/27/2018 3:24 PM Weight: 190.6 lb Height: 66in Body Surface Area: 1.96 m Body Mass Index: 30.76 kg/m  Temp.: 98.21F(Temporal)  Pulse: 73 (Regular)  P.OX: 96% (Room air) BP: 120/78 (Sitting, Left Arm, Standard)        Physical Exam Rodman Key K. Jaiden Wahab MD; 12/27/2018 7:52 PM)  The physical exam findings are as follows: Note:WDWN in NAD Eyes: Pupils equal, round; sclera anicteric HENT: Oral mucosa moist; good dentition Neck: No masses palpated, no thyromegaly Lungs: CTA bilaterally; normal respiratory effort Breasts: right breast - multiple healed incisions in RUOQ; no new palpable masses; no axillary lymphadenopathy left breast - 2.5 cm firm palpable mass at 6:00 about 5 cmfn; no axillary lymphadenopathy CV: Regular rate and rhythm; no murmurs; extremities well-perfused with no edema Abd: +bowel sounds, soft, non-tender, no palpable organomegaly; no palpable hernias Skin: Warm, dry; no sign of jaundice Psychiatric - alert and oriented x 4; calm mood and affect    Assessment & Plan Rodman Key K. Jontavious Commons MD; 12/27/2018 8:02 PM)  INVASIVE DUCTAL CARCINOMA OF BREAST, LEFT (C50.912)  Current Plans Schedule for Surgery - Left nipple-sparing mastectomy and sentinel lymph node biopsy, right prophylactic  nipple-sparing mastectomy with bilateral immediate  reconstruction. The surgical procedure has been discussed with the patient. Potential risks, benefits, alternative treatments, and expected outcomes have been explained. All of the patient's questions at this time have been answered. The likelihood of reaching the patient's treatment goal is good. The patient understand the proposed surgical procedure and wishes to proceed. Note:I spent approximately 45 minutes with the patient and her sister discussing her surgical options. She is obviously very anxious, but has already made up her mind that she wants bilateral mastectomies. We discussed conventional mastectomy, nipple-sparing mastectomy, and sentinel lymph node biopsies.   After examining the patient and her images, she seems to be a candidate for left nipple-sparing mastectomy with axillary sentinel lymph node biopsy and prophylactic right nipple-sparing mastectomy with reconstruction by Dr. Leland Johns. We will begin the scheduling process while we are waiting for her consultation with Dr. Iran Planas. The surgical procedure has been discussed with the patient. Potential risks, benefits, alternative treatments, and expected outcomes have been explained. All of the patient's questions at this time have been answered. The likelihood of reaching the patient's treatment goal is good.The patient understand the proposed surgical procedure and wishes to proceed.  Christine Francis. Georgette Dover, MD, [place] [facility] Beeper [phone]  12/27/2018 8:03 PM

## 2018-12-28 DIAGNOSIS — Z853 Personal history of malignant neoplasm of breast: Secondary | ICD-10-CM | POA: Diagnosis not present

## 2018-12-28 DIAGNOSIS — N950 Postmenopausal bleeding: Secondary | ICD-10-CM | POA: Diagnosis not present

## 2018-12-28 DIAGNOSIS — R9389 Abnormal findings on diagnostic imaging of other specified body structures: Secondary | ICD-10-CM | POA: Diagnosis not present

## 2018-12-30 ENCOUNTER — Ambulatory Visit: Payer: BLUE CROSS/BLUE SHIELD | Admitting: Hematology and Oncology

## 2019-01-03 ENCOUNTER — Encounter: Payer: Self-pay | Admitting: *Deleted

## 2019-01-05 ENCOUNTER — Ambulatory Visit: Payer: Self-pay | Admitting: Surgery

## 2019-01-05 DIAGNOSIS — Z170 Estrogen receptor positive status [ER+]: Secondary | ICD-10-CM | POA: Diagnosis not present

## 2019-01-05 DIAGNOSIS — C50512 Malignant neoplasm of lower-outer quadrant of left female breast: Secondary | ICD-10-CM | POA: Diagnosis not present

## 2019-01-10 ENCOUNTER — Telehealth: Payer: Self-pay | Admitting: Hematology and Oncology

## 2019-01-10 NOTE — Telephone Encounter (Signed)
Scheduled appt per 6/26 sch message - pt aware of appt date and time   

## 2019-01-24 DIAGNOSIS — C50512 Malignant neoplasm of lower-outer quadrant of left female breast: Secondary | ICD-10-CM | POA: Diagnosis not present

## 2019-01-24 DIAGNOSIS — Z170 Estrogen receptor positive status [ER+]: Secondary | ICD-10-CM | POA: Diagnosis not present

## 2019-01-24 NOTE — H&P (Signed)
Subjective:     Patient ID: Christine Francis is a 58 y.o. female.  HPI  Here for follow up discussion breast reconstruction prior to planned bilateral mastectomies. First seen in consultation 2016. At that time, screening MMG revealed abnormalities that led to a biopsy with LCIS. She underwent lumpectomy at that time and has been on tamoxifen. Diagnostic MMG  11/2018 showed a 2 cm mass in the left breast. US showed the mass to be 2.5 cm at 5 o clock 5 cmfn, one small axillary node noted. Biopsy demonstrated invasive mammary carcinoma and invasive mammary carcinoma in situ, ER/PR+ HER2 -.   Patient has had prior lumpectomy 11/2009 for ADH. Prior fibroadenoma excision as well.   Plan oncotype on surgical specimen, anastrazole to follow surgery. On tamoxifen- reviewed to stop this 2 w prior to surgery.   Genetics negative.  Patient has had difficulty in completing breast MRI due to claustrophobia. Does note difference in volume after two prior procedures, but not enough that she uses insert for bra.  Current 36 D, desires to be smaller. Wt up 9 lb since 2016.  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 Sister lives in [place], she is IP OB RN with [employer] and will assist with her post op care.   Review of Systems     Objective:   Physical Exam  Cardiovascular: Normal rate, regular rhythm and normal heart sounds.  Pulmonary/Chest: Effort normal and breath sounds normal.   Breast R upper pole transverse scar Grade 2 ptosis bilat Palpable mass left breast 5-6 o clock with some tethering of skin SN to nipple R 24 L 28 BW R 17 L 18 Nipple to IMF R 8 L 11 Active intertrigo bilateral IMF    Assessment:     Hx LCIS right breast s/p lumpectomy Left breast invasive cancer LOQ, ER+    Plan:     Planbilateral skin reduction pattern mastectomieswith immediate expander acellular dermis reconstruction.Reviewed anchor type scars, drains, OR length, hospital stay and  recovery, limitations. Discussed process of expansion and implant based risks including rupture, MRI surveillance for silicone implants, infection requiring surgery or removal, contracture. Reviewed risks mastectomy flap necrosis requiring additional surgery.This is especially concern given large scar over right UOQ.  Discussed use of acellular dermis in reconstruction, cadaveric source, incorporation over several weeks, risk that if has seroma or infection can act as additional nidus for infection if not incorporated.  Discussed prepectoral vs sub pectoral reconstruction. Discussed with patient and benefit of this is no animation deformity, may be less pain. Risk may be more visible rippling over upper poles, greater need of ADM. Reviewed pre pectoral would require larger amount acellular dermis, more drains. Discussed any type reconstruction also risks long term displacement implant and visible rippling. If prepectoral counseled I would recommend she be comfortable with silicone implants as more options that have less rippling. She agrees to prepectoral placement.  Reviewed reconstruction will be asensate and not stimulate. Reviewed additional risks including but not limited to risks mastectomy flap necrosis requiring additional surgery, seroma, hematoma, asymmetry, need to additional procedures, fat necrosis, DVT/PE, damage to adjacent structures, cardiopulmonary complications.  Discussed risk COVID infectionthrough this elective surgery. It is likely patient will receive COVID testing prior to surgery. Discussed even if patient receivesa negative test result, the tests in some cases may fail to detect the virus or patient maycontract COVID after the test.COVID 19 infectionbefore/during/aftersurgery may result in lead to a higher chance of complication and death.  [name], MD [facility] [phone], pin [phone]

## 2019-02-02 NOTE — Progress Notes (Signed)
[id number] - [place], [address] [place] [address] Phone: [phone] Fax: [phone]      Your procedure is scheduled on July 27  Report to [facility] Main Entrance "A" at 0530 A.M., and check in at the Admitting office.  Call this number if you have problems the morning of surgery:  [phone]  Call [phone] if you have any questions prior to your surgery date Monday-Friday 8am-4pm    Remember:  Do not eat after midnight the night before your surgery  You may drink clear liquids until 0430am the morning of your surgery.   Clear liquids allowed are: Water, Non-Citrus Juices (without pulp), Carbonated Beverages, Clear Tea, Black Coffee Only, and Gatorade    Take these medicines the morning of surgery with A SIP OF WATER  albuterol (PROVENTIL HFA;VENTOLIN HFA) if needed, Please bring all inhalers with you the day of surgery.  ALPRAZolam (XANAX)  montelukast (SINGULAIR) omeprazole (PRILOSEC OTC) tamoxifen (NOLVADEX)  7 days prior to surgery STOP taking any Aspirin (unless otherwise instructed by your surgeon), Aleve, Naproxen, Ibuprofen, Motrin, Advil, Goody's, BC's, all herbal medications, fish oil, and all vitamins.    The Morning of Surgery  Do not wear jewelry, make-up or nail polish.  Do not wear lotions, powders, or perfumes/colognes, or deodorant  Do not shave 48 hours prior to surgery.  Men may shave face and neck.  Do not bring valuables to the hospital.  [facility] is not responsible for any belongings or valuables.  If you are a smoker, DO NOT Smoke 24 hours prior to surgery IF you wear a CPAP at night please bring your mask, tubing, and machine the morning of surgery   Remember that you must have someone to transport you home after your surgery, and remain with you for 24 hours if you are discharged the same day.   Contacts, glasses, hearing aids, dentures or bridgework may not be worn into surgery.    Leave  your suitcase in the car.  After surgery it may be brought to your room.  For patients admitted to the hospital, discharge time will be determined by your treatment team.  Patients discharged the day of surgery will not be allowed to drive home.    Special instructions:   [place]- Preparing For Surgery  Before surgery, you can play an important role. Because skin is not sterile, your skin needs to be as free of germs as possible. You can reduce the number of germs on your skin by washing with CHG (chlorahexidine gluconate) Soap before surgery.  CHG is an antiseptic cleaner which kills germs and bonds with the skin to continue killing germs even after washing.    Oral Hygiene is also important to reduce your risk of infection.  Remember - BRUSH YOUR TEETH THE MORNING OF SURGERY WITH YOUR REGULAR TOOTHPASTE  Please do not use if you have an allergy to CHG or antibacterial soaps. If your skin becomes reddened/irritated stop using the CHG.  Do not shave (including legs and underarms) for at least 48 hours prior to first CHG shower. It is OK to shave your face.  Please follow these instructions carefully.   1. Shower the NIGHT BEFORE SURGERY and the MORNING OF SURGERY with CHG Soap.   2. If you chose to wash your hair, wash your hair first as usual with your normal shampoo.  3. After you shampoo, rinse your hair and body thoroughly to remove the shampoo.  4. Use CHG as you would any other liquid soap. You can apply CHG directly to the skin and wash gently with a scrungie or a clean washcloth.   5. Apply the CHG Soap to your body ONLY FROM THE NECK DOWN.  Do not use on open wounds or open sores. Avoid contact with your eyes, ears, mouth and genitals (private parts). Wash Face and genitals (private parts)  with your normal soap.   6. Wash thoroughly, paying special attention to the area where your surgery will be performed.  7. Thoroughly rinse your body with warm water from the neck  down.  8. DO NOT shower/wash with your normal soap after using and rinsing off the CHG Soap.  9. Pat yourself dry with a CLEAN TOWEL.  10. Wear CLEAN PAJAMAS to bed the night before surgery, wear comfortable clothes the morning of surgery  11. Place CLEAN SHEETS on your bed the night of your first shower and DO NOT SLEEP WITH PETS.    Day of Surgery:  Do not apply any deodorants/lotions. Please shower the morning of surgery with the CHG soap  Please wear clean clothes to the hospital/surgery center.   Remember to brush your teeth WITH YOUR REGULAR TOOTHPASTE.   Please read over the following fact sheets that you were given.

## 2019-02-03 ENCOUNTER — Encounter (HOSPITAL_COMMUNITY)
Admission: RE | Admit: 2019-02-03 | Discharge: 2019-02-03 | Disposition: A | Discharge status: Home / Self Care | Payer: BC Managed Care – PPO | Source: Ambulatory Visit | Attending: Surgery | Admitting: Surgery

## 2019-02-03 ENCOUNTER — Encounter (HOSPITAL_COMMUNITY): Payer: Self-pay

## 2019-02-03 ENCOUNTER — Other Ambulatory Visit: Payer: Self-pay

## 2019-02-03 ENCOUNTER — Other Ambulatory Visit (HOSPITAL_COMMUNITY)
Admission: RE | Admit: 2019-02-03 | Discharge: 2019-02-03 | Disposition: A | Discharge status: Home / Self Care | Payer: BC Managed Care – PPO | Source: Ambulatory Visit | Attending: Surgery | Admitting: Surgery

## 2019-02-03 DIAGNOSIS — C7981 Secondary malignant neoplasm of breast: Secondary | ICD-10-CM | POA: Diagnosis not present

## 2019-02-03 DIAGNOSIS — I10 Essential (primary) hypertension: Secondary | ICD-10-CM | POA: Insufficient documentation

## 2019-02-03 DIAGNOSIS — I4510 Unspecified right bundle-branch block: Secondary | ICD-10-CM | POA: Insufficient documentation

## 2019-02-03 DIAGNOSIS — Z01818 Encounter for other preprocedural examination: Secondary | ICD-10-CM | POA: Diagnosis not present

## 2019-02-03 DIAGNOSIS — Z1159 Encounter for screening for other viral diseases: Secondary | ICD-10-CM | POA: Insufficient documentation

## 2019-02-03 LAB — BASIC METABOLIC PANEL
Anion gap: 13 (ref 5–15)
BUN: 15 mg/dL (ref 6–20)
CO2: 25 mmol/L (ref 22–32)
Calcium: 9.4 mg/dL (ref 8.9–10.3)
Chloride: 102 mmol/L (ref 98–111)
Creatinine, Ser: 0.78 mg/dL (ref 0.44–1.00)
GFR calc Af Amer: >60 mL/min (ref >60)
GFR calc non Af Amer: >60 mL/min (ref >60)
Glucose, Bld: 105 mg/dL — ABNORMAL HIGH (ref 70–99)
Potassium: 3.2 mmol/L — ABNORMAL LOW (ref 3.5–5.1)
Sodium: 140 mmol/L (ref 135–145)

## 2019-02-03 LAB — CBC
HCT: 41.8 % (ref 36.0–46.0)
Hemoglobin: 13.6 g/dL (ref 12.0–15.0)
MCH: 28.5 pg (ref 26.0–34.0)
MCHC: 32.5 g/dL (ref 30.0–36.0)
MCV: 87.4 fL (ref 80.0–100.0)
Platelets: 240 10*3/uL (ref 150–400)
RBC: 4.78 MIL/uL (ref 3.87–5.11)
RDW: 13.2 % (ref 11.5–15.5)
WBC: 7.5 10*3/uL (ref 4.0–10.5)
nRBC: 0 % (ref 0.0–0.2)

## 2019-02-03 LAB — SARS CORONAVIRUS 2 (TAT 6-24 HRS): SARS Coronavirus 2: NEGATIVE

## 2019-02-03 NOTE — Progress Notes (Signed)
PCP - Molli Posey Cardiologist - denies  Chest x-ray - not needed EKG - 02/03/19 Stress Test - denies ECHO - denies Cardiac Cath - denies  Anesthesia review:  Abnormal EKG  Patient denies shortness of breath, fever, cough and chest pain at PAT appointment   Patient verbalized understanding of instructions that were given to them at the PAT appointment. Patient was also instructed that they will need to review over the PAT instructions again at home before surgery.

## 2019-02-07 ENCOUNTER — Other Ambulatory Visit: Payer: Self-pay

## 2019-02-07 ENCOUNTER — Encounter (HOSPITAL_COMMUNITY)
Admission: RE | Admit: 2019-02-07 | Discharge: 2019-02-07 | Disposition: A | Discharge status: Home / Self Care | Payer: BC Managed Care – PPO | Source: Ambulatory Visit | Attending: Surgery | Admitting: Surgery

## 2019-02-07 ENCOUNTER — Encounter (HOSPITAL_COMMUNITY): Payer: Self-pay

## 2019-02-07 ENCOUNTER — Observation Stay (HOSPITAL_COMMUNITY)
Admission: RE | Admit: 2019-02-07 | Discharge: 2019-02-08 | Disposition: A | Discharge status: Home / Self Care | Payer: BC Managed Care – PPO | Attending: Surgery | Admitting: Surgery

## 2019-02-07 ENCOUNTER — Ambulatory Visit (HOSPITAL_COMMUNITY): Payer: BC Managed Care – PPO | Admitting: Anesthesiology

## 2019-02-07 ENCOUNTER — Ambulatory Visit (HOSPITAL_COMMUNITY): Payer: BC Managed Care – PPO | Admitting: Physician Assistant

## 2019-02-07 ENCOUNTER — Encounter (HOSPITAL_COMMUNITY)
Admission: RE | Disposition: A | Discharge status: Home / Self Care | Payer: Self-pay | Source: Home / Self Care | Attending: Surgery

## 2019-02-07 DIAGNOSIS — C50912 Malignant neoplasm of unspecified site of left female breast: Secondary | ICD-10-CM | POA: Diagnosis not present

## 2019-02-07 DIAGNOSIS — C50512 Malignant neoplasm of lower-outer quadrant of left female breast: Secondary | ICD-10-CM | POA: Diagnosis not present

## 2019-02-07 DIAGNOSIS — I10 Essential (primary) hypertension: Secondary | ICD-10-CM | POA: Insufficient documentation

## 2019-02-07 DIAGNOSIS — Z79899 Other long term (current) drug therapy: Secondary | ICD-10-CM | POA: Diagnosis not present

## 2019-02-07 DIAGNOSIS — F419 Anxiety disorder, unspecified: Secondary | ICD-10-CM | POA: Insufficient documentation

## 2019-02-07 DIAGNOSIS — C50812 Malignant neoplasm of overlapping sites of left female breast: Secondary | ICD-10-CM | POA: Diagnosis not present

## 2019-02-07 DIAGNOSIS — Z170 Estrogen receptor positive status [ER+]: Secondary | ICD-10-CM | POA: Diagnosis not present

## 2019-02-07 DIAGNOSIS — Z853 Personal history of malignant neoplasm of breast: Secondary | ICD-10-CM | POA: Insufficient documentation

## 2019-02-07 DIAGNOSIS — K219 Gastro-esophageal reflux disease without esophagitis: Secondary | ICD-10-CM | POA: Diagnosis not present

## 2019-02-07 DIAGNOSIS — N6092 Unspecified benign mammary dysplasia of left breast: Secondary | ICD-10-CM | POA: Diagnosis not present

## 2019-02-07 DIAGNOSIS — D4861 Neoplasm of uncertain behavior of right breast: Secondary | ICD-10-CM | POA: Diagnosis not present

## 2019-02-07 DIAGNOSIS — Z4001 Encounter for prophylactic removal of breast: Secondary | ICD-10-CM | POA: Diagnosis not present

## 2019-02-07 DIAGNOSIS — J45909 Unspecified asthma, uncomplicated: Secondary | ICD-10-CM | POA: Insufficient documentation

## 2019-02-07 DIAGNOSIS — N6011 Diffuse cystic mastopathy of right breast: Secondary | ICD-10-CM | POA: Diagnosis not present

## 2019-02-07 DIAGNOSIS — Z9013 Acquired absence of bilateral breasts and nipples: Secondary | ICD-10-CM

## 2019-02-07 HISTORY — PX: MASTECTOMY W/ SENTINEL NODE BIOPSY: SHX2001

## 2019-02-07 HISTORY — PX: BREAST RECONSTRUCTION WITH PLACEMENT OF TISSUE EXPANDER AND ALLODERM: SHX6805

## 2019-02-07 SURGERY — MASTECTOMY WITH SENTINEL LYMPH NODE BIOPSY
Anesthesia: General | Site: Breast | Laterality: Bilateral

## 2019-02-07 MED ORDER — GABAPENTIN 300 MG PO CAPS
300.0000 mg | ORAL_CAPSULE | Freq: Two times a day (BID) | ORAL | Status: DC
  Administered 2019-02-07 – 2019-02-08 (×3): 300 mg via ORAL
  Filled 2019-02-07 (×3): qty 1

## 2019-02-07 MED ORDER — ONDANSETRON HCL 4 MG/2ML IJ SOLN: 4.0000 mg | Freq: Four times a day (QID) | INTRAMUSCULAR | Status: DC | PRN

## 2019-02-07 MED ORDER — ATENOLOL 50 MG PO TABS
50.0000 mg | ORAL_TABLET | Freq: Every day | ORAL | Status: DC
  Administered 2019-02-08: 50 mg via ORAL
  Filled 2019-02-07: qty 1

## 2019-02-07 MED ORDER — SODIUM CHLORIDE 0.9 % IV SOLN
Freq: Once | INTRAVENOUS | Status: DC
  Filled 2019-02-07: qty 1

## 2019-02-07 MED ORDER — FENTANYL CITRATE (PF) 100 MCG/2ML IJ SOLN
25.0000 ug | INTRAMUSCULAR | Status: DC | PRN
  Administered 2019-02-07 (×2): 50 ug via INTRAVENOUS

## 2019-02-07 MED ORDER — GABAPENTIN 300 MG PO CAPS
ORAL_CAPSULE | ORAL | Status: AC
  Administered 2019-02-07: 300 mg via ORAL
  Filled 2019-02-07: qty 1

## 2019-02-07 MED ORDER — METHOCARBAMOL 500 MG PO TABS
ORAL_TABLET | ORAL | Status: AC
  Filled 2019-02-07: qty 1

## 2019-02-07 MED ORDER — ONDANSETRON 4 MG PO TBDP: 4.0000 mg | ORAL_TABLET | Freq: Four times a day (QID) | ORAL | Status: DC | PRN

## 2019-02-07 MED ORDER — METHOCARBAMOL 500 MG PO TABS: 500.0000 mg | ORAL_TABLET | Freq: Three times a day (TID) | ORAL | 0 refills | Status: DC | PRN

## 2019-02-07 MED ORDER — ALPRAZOLAM 0.25 MG PO TABS: 0.2500 mg | ORAL_TABLET | Freq: Three times a day (TID) | ORAL | Status: DC | PRN

## 2019-02-07 MED ORDER — ONDANSETRON HCL 4 MG/2ML IJ SOLN
INTRAMUSCULAR | Status: DC | PRN
  Administered 2019-02-07: 4 mg via INTRAVENOUS

## 2019-02-07 MED ORDER — POVIDONE-IODINE 5 % EX SOLN
CUTANEOUS | Status: DC | PRN
  Administered 2019-02-07: 1 via TOPICAL

## 2019-02-07 MED ORDER — OXYCODONE HCL 5 MG PO TABS: 5.0000 mg | ORAL_TABLET | ORAL | 0 refills | Status: DC | PRN

## 2019-02-07 MED ORDER — DOCUSATE SODIUM 100 MG PO CAPS
100.0000 mg | ORAL_CAPSULE | Freq: Two times a day (BID) | ORAL | Status: DC
  Administered 2019-02-07 – 2019-02-08 (×3): 100 mg via ORAL
  Filled 2019-02-07 (×3): qty 1

## 2019-02-07 MED ORDER — KETOROLAC TROMETHAMINE 30 MG/ML IJ SOLN
30.0000 mg | Freq: Three times a day (TID) | INTRAMUSCULAR | Status: AC
  Administered 2019-02-07 – 2019-02-08 (×3): 30 mg via INTRAVENOUS
  Filled 2019-02-07 (×4): qty 1

## 2019-02-07 MED ORDER — CEFAZOLIN SODIUM-DEXTROSE 2-4 GM/100ML-% IV SOLN
2.0000 g | Freq: Three times a day (TID) | INTRAVENOUS | Status: DC
  Filled 2019-02-07: qty 100

## 2019-02-07 MED ORDER — ACETAMINOPHEN 500 MG PO TABS
1000.0000 mg | ORAL_TABLET | Freq: Four times a day (QID) | ORAL | Status: DC
  Administered 2019-02-07 – 2019-02-08 (×3): 1000 mg via ORAL
  Filled 2019-02-07 (×3): qty 2

## 2019-02-07 MED ORDER — TECHNETIUM TC 99M SULFUR COLLOID FILTERED
1.0000 | Freq: Once | INTRAVENOUS | Status: AC | PRN
  Administered 2019-02-07: 07:00:00 1 via INTRADERMAL

## 2019-02-07 MED ORDER — ROCURONIUM BROMIDE 10 MG/ML (PF) SYRINGE
PREFILLED_SYRINGE | INTRAVENOUS | Status: AC
  Filled 2019-02-07: qty 10

## 2019-02-07 MED ORDER — SODIUM CHLORIDE (PF) 0.9 % IJ SOLN
INTRAVENOUS | Status: DC | PRN
  Administered 2019-02-07: 3 mL via INTRAMUSCULAR

## 2019-02-07 MED ORDER — CIPROFLOXACIN HCL 500 MG PO TABS: 500.0000 mg | ORAL_TABLET | Freq: Two times a day (BID) | ORAL | 0 refills | Status: AC

## 2019-02-07 MED ORDER — 0.9 % SODIUM CHLORIDE (POUR BTL) OPTIME
TOPICAL | Status: DC | PRN
  Administered 2019-02-07 (×5): 1000 mL

## 2019-02-07 MED ORDER — TRAMADOL HCL 50 MG PO TABS: 50.0000 mg | ORAL_TABLET | Freq: Four times a day (QID) | ORAL | Status: DC | PRN

## 2019-02-07 MED ORDER — SODIUM CHLORIDE (PF) 0.9 % IJ SOLN
INTRAMUSCULAR | Status: AC
  Filled 2019-02-07: qty 10

## 2019-02-07 MED ORDER — CEFAZOLIN SODIUM-DEXTROSE 2-4 GM/100ML-% IV SOLN
2.0000 g | INTRAVENOUS | Status: AC
  Administered 2019-02-07: 2 g via INTRAVENOUS
  Filled 2019-02-07: qty 100

## 2019-02-07 MED ORDER — POTASSIUM CHLORIDE IN NACL 20-0.9 MEQ/L-% IV SOLN
INTRAVENOUS | Status: DC
  Administered 2019-02-07: 15:00:00 via INTRAVENOUS
  Filled 2019-02-07 (×2): qty 1000

## 2019-02-07 MED ORDER — MIDAZOLAM HCL 2 MG/2ML IJ SOLN
INTRAMUSCULAR | Status: AC
  Filled 2019-02-07: qty 2

## 2019-02-07 MED ORDER — ENOXAPARIN SODIUM 40 MG/0.4ML ~~LOC~~ SOLN
40.0000 mg | SUBCUTANEOUS | Status: DC
  Administered 2019-02-08: 40 mg via SUBCUTANEOUS
  Filled 2019-02-07: qty 0.4

## 2019-02-07 MED ORDER — LACTATED RINGERS IV SOLN
INTRAVENOUS | Status: DC | PRN
  Administered 2019-02-07 (×2): via INTRAVENOUS

## 2019-02-07 MED ORDER — DIPHENHYDRAMINE HCL 50 MG/ML IJ SOLN: 12.5000 mg | Freq: Four times a day (QID) | INTRAMUSCULAR | Status: DC | PRN

## 2019-02-07 MED ORDER — PHENYLEPHRINE 40 MCG/ML (10ML) SYRINGE FOR IV PUSH (FOR BLOOD PRESSURE SUPPORT)
PREFILLED_SYRINGE | INTRAVENOUS | Status: DC | PRN
  Administered 2019-02-07: 40 ug via INTRAVENOUS

## 2019-02-07 MED ORDER — ATENOLOL-CHLORTHALIDONE 50-25 MG PO TABS: 1.0000 | ORAL_TABLET | Freq: Every day | ORAL | Status: DC

## 2019-02-07 MED ORDER — PROPOFOL 10 MG/ML IV BOLUS
INTRAVENOUS | Status: DC | PRN
  Administered 2019-02-07: 180 mg via INTRAVENOUS

## 2019-02-07 MED ORDER — CEFAZOLIN SODIUM-DEXTROSE 1-4 GM/50ML-% IV SOLN
1.0000 g | Freq: Three times a day (TID) | INTRAVENOUS | Status: AC
  Administered 2019-02-07 – 2019-02-08 (×3): 1 g via INTRAVENOUS
  Filled 2019-02-07 (×3): qty 50

## 2019-02-07 MED ORDER — POTASSIUM CHLORIDE CRYS ER 20 MEQ PO TBCR
20.0000 meq | EXTENDED_RELEASE_TABLET | Freq: Every day | ORAL | Status: DC
  Administered 2019-02-07 – 2019-02-08 (×2): 20 meq via ORAL
  Filled 2019-02-07 (×2): qty 1

## 2019-02-07 MED ORDER — PANTOPRAZOLE SODIUM 40 MG PO TBEC
40.0000 mg | DELAYED_RELEASE_TABLET | Freq: Every day | ORAL | Status: DC
  Administered 2019-02-08: 40 mg via ORAL
  Filled 2019-02-07: qty 1

## 2019-02-07 MED ORDER — ONDANSETRON HCL 4 MG/2ML IJ SOLN
INTRAMUSCULAR | Status: AC
  Filled 2019-02-07: qty 2

## 2019-02-07 MED ORDER — MIDAZOLAM HCL 5 MG/5ML IJ SOLN
INTRAMUSCULAR | Status: DC | PRN
  Administered 2019-02-07: 2 mg via INTRAVENOUS

## 2019-02-07 MED ORDER — ALBUTEROL SULFATE (2.5 MG/3ML) 0.083% IN NEBU: 3.0000 mL | INHALATION_SOLUTION | Freq: Four times a day (QID) | RESPIRATORY_TRACT | Status: DC | PRN

## 2019-02-07 MED ORDER — SUGAMMADEX SODIUM 200 MG/2ML IV SOLN
INTRAVENOUS | Status: DC | PRN
  Administered 2019-02-07: 175 mg via INTRAVENOUS

## 2019-02-07 MED ORDER — OXYCODONE HCL 5 MG PO TABS
ORAL_TABLET | ORAL | Status: AC
  Filled 2019-02-07: qty 2

## 2019-02-07 MED ORDER — OXYCODONE HCL 5 MG PO TABS
5.0000 mg | ORAL_TABLET | ORAL | Status: DC | PRN
  Administered 2019-02-07 – 2019-02-08 (×2): 10 mg via ORAL
  Filled 2019-02-07: qty 2

## 2019-02-07 MED ORDER — DEXAMETHASONE SODIUM PHOSPHATE 10 MG/ML IJ SOLN
INTRAMUSCULAR | Status: AC
  Filled 2019-02-07: qty 1

## 2019-02-07 MED ORDER — FENTANYL CITRATE (PF) 250 MCG/5ML IJ SOLN
INTRAMUSCULAR | Status: AC
  Filled 2019-02-07: qty 5

## 2019-02-07 MED ORDER — GABAPENTIN 300 MG PO CAPS
300.0000 mg | ORAL_CAPSULE | ORAL | Status: AC
  Administered 2019-02-07: 300 mg via ORAL

## 2019-02-07 MED ORDER — MONTELUKAST SODIUM 10 MG PO TABS
10.0000 mg | ORAL_TABLET | Freq: Every day | ORAL | Status: DC
  Administered 2019-02-08: 10 mg via ORAL
  Filled 2019-02-07: qty 1

## 2019-02-07 MED ORDER — LIDOCAINE 2% (20 MG/ML) 5 ML SYRINGE
INTRAMUSCULAR | Status: AC
  Filled 2019-02-07: qty 5

## 2019-02-07 MED ORDER — CALCIUM CARBONATE-VITAMIN D 500-200 MG-UNIT PO TABS
1.0000 | ORAL_TABLET | Freq: Every day | ORAL | Status: DC
  Administered 2019-02-08: 1 via ORAL
  Filled 2019-02-07: qty 1

## 2019-02-07 MED ORDER — CHLORHEXIDINE GLUCONATE CLOTH 2 % EX PADS
6.0000 | MEDICATED_PAD | Freq: Once | CUTANEOUS | Status: DC
  Administered 2019-02-07: 6 via TOPICAL

## 2019-02-07 MED ORDER — METHYLENE BLUE 0.5 % INJ SOLN
INTRAVENOUS | Status: AC
  Filled 2019-02-07: qty 10

## 2019-02-07 MED ORDER — ROCURONIUM BROMIDE 10 MG/ML (PF) SYRINGE
PREFILLED_SYRINGE | INTRAVENOUS | Status: DC | PRN
  Administered 2019-02-07: 50 mg via INTRAVENOUS
  Administered 2019-02-07: 20 mg via INTRAVENOUS
  Administered 2019-02-07: 10 mg via INTRAVENOUS
  Administered 2019-02-07: 20 mg via INTRAVENOUS

## 2019-02-07 MED ORDER — DEXAMETHASONE SODIUM PHOSPHATE 10 MG/ML IJ SOLN
INTRAMUSCULAR | Status: DC | PRN
  Administered 2019-02-07: 10 mg via INTRAVENOUS

## 2019-02-07 MED ORDER — CHLORTHALIDONE 25 MG PO TABS
25.0000 mg | ORAL_TABLET | Freq: Every day | ORAL | Status: DC
  Administered 2019-02-08: 25 mg via ORAL
  Filled 2019-02-07: qty 1

## 2019-02-07 MED ORDER — DIPHENHYDRAMINE HCL 12.5 MG/5ML PO ELIX: 12.5000 mg | ORAL_SOLUTION | Freq: Four times a day (QID) | ORAL | Status: DC | PRN

## 2019-02-07 MED ORDER — SODIUM CHLORIDE 0.9 % IV SOLN
INTRAVENOUS | Status: DC | PRN
  Administered 2019-02-07: 08:00:00 1000 mL

## 2019-02-07 MED ORDER — FENTANYL CITRATE (PF) 100 MCG/2ML IJ SOLN
INTRAMUSCULAR | Status: DC | PRN
  Administered 2019-02-07 (×7): 50 ug via INTRAVENOUS

## 2019-02-07 MED ORDER — PHENYLEPHRINE 40 MCG/ML (10ML) SYRINGE FOR IV PUSH (FOR BLOOD PRESSURE SUPPORT)
PREFILLED_SYRINGE | INTRAVENOUS | Status: AC
  Filled 2019-02-07: qty 10

## 2019-02-07 MED ORDER — LIDOCAINE 2% (20 MG/ML) 5 ML SYRINGE
INTRAMUSCULAR | Status: DC | PRN
  Administered 2019-02-07: 60 mg via INTRAVENOUS

## 2019-02-07 MED ORDER — SCOPOLAMINE 1 MG/3DAYS TD PT72
MEDICATED_PATCH | TRANSDERMAL | Status: DC | PRN
  Administered 2019-02-07: 1 via TRANSDERMAL

## 2019-02-07 MED ORDER — FENTANYL CITRATE (PF) 100 MCG/2ML IJ SOLN
INTRAMUSCULAR | Status: AC
  Filled 2019-02-07: qty 2

## 2019-02-07 MED ORDER — METHOCARBAMOL 500 MG PO TABS
500.0000 mg | ORAL_TABLET | Freq: Four times a day (QID) | ORAL | Status: DC | PRN
  Administered 2019-02-07 – 2019-02-08 (×2): 500 mg via ORAL
  Filled 2019-02-07 (×2): qty 1

## 2019-02-07 MED ORDER — MORPHINE SULFATE (PF) 2 MG/ML IV SOLN
2.0000 mg | INTRAVENOUS | Status: DC | PRN
  Administered 2019-02-07: 2 mg via INTRAVENOUS
  Filled 2019-02-07: qty 1

## 2019-02-07 MED ORDER — ACETAMINOPHEN 500 MG PO TABS
1000.0000 mg | ORAL_TABLET | ORAL | Status: AC
  Administered 2019-02-07: 1000 mg via ORAL
  Filled 2019-02-07: qty 2

## 2019-02-07 MED ORDER — PROPOFOL 10 MG/ML IV BOLUS
INTRAVENOUS | Status: AC
  Filled 2019-02-07: qty 20

## 2019-02-07 MED ORDER — OXYCODONE HCL 5 MG PO TABS: 5.0000 mg | ORAL_TABLET | Freq: Once | ORAL | Status: DC | PRN

## 2019-02-07 MED ORDER — OXYCODONE HCL 5 MG/5ML PO SOLN: 5.0000 mg | Freq: Once | ORAL | Status: DC | PRN

## 2019-02-07 MED ORDER — VITAMIN D 25 MCG (1000 UNIT) PO TABS
2000.0000 [IU] | ORAL_TABLET | Freq: Every day | ORAL | Status: DC
  Administered 2019-02-07 – 2019-02-08 (×2): 2000 [IU] via ORAL
  Filled 2019-02-07 (×2): qty 2

## 2019-02-07 MED ORDER — SCOPOLAMINE 1 MG/3DAYS TD PT72
MEDICATED_PATCH | TRANSDERMAL | Status: AC
  Filled 2019-02-07: qty 1

## 2019-02-07 SURGICAL SUPPLY — 83 items
ALLOGRAFT PERF 16X20 1.6+/-0.4 (Tissue) ×2 IMPLANT
APPLIER CLIP 9.375 MED OPEN (MISCELLANEOUS) ×3
BAG DECANTER FOR FLEXI CONT (MISCELLANEOUS) ×3 IMPLANT
BINDER BREAST LRG (GAUZE/BANDAGES/DRESSINGS) IMPLANT
BINDER BREAST XLRG (GAUZE/BANDAGES/DRESSINGS) ×1 IMPLANT
BLADE SURG 10 STRL SS (BLADE) ×2 IMPLANT
CANISTER SUCT 3000ML PPV (MISCELLANEOUS) ×6 IMPLANT
CHLORAPREP W/TINT 26 (MISCELLANEOUS) ×6 IMPLANT
CLIP APPLIE 9.375 MED OPEN (MISCELLANEOUS) ×2 IMPLANT
CONT SPEC 4OZ CLIKSEAL STRL BL (MISCELLANEOUS) ×5 IMPLANT
COVER PROBE W GEL 5X96 (DRAPES) ×3 IMPLANT
COVER SURGICAL LIGHT HANDLE (MISCELLANEOUS) ×6 IMPLANT
COVER WAND RF STERILE (DRAPES) ×6 IMPLANT
DERMABOND ADHESIVE PROPEN (GAUZE/BANDAGES/DRESSINGS) ×3
DERMABOND ADVANCED (GAUZE/BANDAGES/DRESSINGS) ×2
DERMABOND ADVANCED .7 DNX12 (GAUZE/BANDAGES/DRESSINGS) ×4 IMPLANT
DERMABOND ADVANCED .7 DNX6 (GAUZE/BANDAGES/DRESSINGS) IMPLANT
DRAIN CHANNEL 15F RND FF W/TCR (WOUND CARE) ×2 IMPLANT
DRAIN CHANNEL 19F RND (DRAIN) ×4 IMPLANT
DRAPE CHEST BREAST 15X10 FENES (DRAPES) ×3 IMPLANT
DRAPE HALF SHEET 40X57 (DRAPES) IMPLANT
DRAPE ORTHO SPLIT 77X108 STRL (DRAPES) ×2
DRAPE SURG ORHT 6 SPLT 77X108 (DRAPES) ×4 IMPLANT
DRAPE WARM FLUID 44X44 (DRAPES) ×3 IMPLANT
DRSG PAD ABDOMINAL 8X10 ST (GAUZE/BANDAGES/DRESSINGS) ×12 IMPLANT
DRSG TEGADERM 4X4.75 (GAUZE/BANDAGES/DRESSINGS) ×14 IMPLANT
ELECT BLADE 4.0 EZ CLEAN MEGAD (MISCELLANEOUS) ×6
ELECT CAUTERY BLADE 6.4 (BLADE) ×6 IMPLANT
ELECT COATED BLADE 2.86 ST (ELECTRODE) ×3 IMPLANT
ELECT REM PT RETURN 9FT ADLT (ELECTROSURGICAL) ×6
ELECTRODE BLDE 4.0 EZ CLN MEGD (MISCELLANEOUS) ×4 IMPLANT
ELECTRODE REM PT RTRN 9FT ADLT (ELECTROSURGICAL) ×4 IMPLANT
EVACUATOR SILICONE 100CC (DRAIN) ×6 IMPLANT
EXPANDER TISSUE FV FOURTE 500 (Prosthesis & Implant Plastic) IMPLANT
GAUZE SPONGE 4X4 12PLY STRL (GAUZE/BANDAGES/DRESSINGS) ×3 IMPLANT
GLOVE BIO SURGEON STRL SZ 6 (GLOVE) ×9 IMPLANT
GLOVE BIO SURGEON STRL SZ7 (GLOVE) ×3 IMPLANT
GLOVE BIOGEL PI IND STRL 7.5 (GLOVE) ×2 IMPLANT
GLOVE BIOGEL PI INDICATOR 7.5 (GLOVE) ×1
GOWN STRL REUS W/ TWL LRG LVL3 (GOWN DISPOSABLE) ×8 IMPLANT
GOWN STRL REUS W/TWL LRG LVL3 (GOWN DISPOSABLE) ×4
KIT BASIN OR (CUSTOM PROCEDURE TRAY) ×6 IMPLANT
KIT TURNOVER KIT B (KITS) ×6 IMPLANT
MARKER SKIN DUAL TIP RULER LAB (MISCELLANEOUS) ×3 IMPLANT
NDL 18GX1X1/2 (RX/OR ONLY) (NEEDLE) ×2 IMPLANT
NDL FILTER BLUNT 18X1 1/2 (NEEDLE) ×2 IMPLANT
NDL HYPO 25GX1X1/2 BEV (NEEDLE) ×2 IMPLANT
NEEDLE 18GX1X1/2 (RX/OR ONLY) (NEEDLE) ×3 IMPLANT
NEEDLE FILTER BLUNT 18X 1/2SAF (NEEDLE) ×1
NEEDLE FILTER BLUNT 18X1 1/2 (NEEDLE) ×2 IMPLANT
NEEDLE HYPO 25GX1X1/2 BEV (NEEDLE) ×3 IMPLANT
NS IRRIG 1000ML POUR BTL (IV SOLUTION) ×14 IMPLANT
PACK GENERAL/GYN (CUSTOM PROCEDURE TRAY) ×6 IMPLANT
PAD ABD 8X10 STRL (GAUZE/BANDAGES/DRESSINGS) ×3 IMPLANT
PAD ARMBOARD 7.5X6 YLW CONV (MISCELLANEOUS) ×6 IMPLANT
PENCIL SMOKE EVACUATOR (MISCELLANEOUS) ×3 IMPLANT
PIN SAFETY STERILE (MISCELLANEOUS) ×3 IMPLANT
PUNCH BIOPSY 4MM (MISCELLANEOUS)
PUNCH BIOPSY DERMAL 6MM STRL (MISCELLANEOUS) IMPLANT
PUNCH BIOPSY DISP 4 (MISCELLANEOUS) IMPLANT
SET ASEPTIC TRANSFER (MISCELLANEOUS) ×3 IMPLANT
SOL PREP POV-IOD 4OZ 10% (MISCELLANEOUS) ×3 IMPLANT
SPECIMEN JAR X LARGE (MISCELLANEOUS) ×3 IMPLANT
SPONGE LAP 18X18 RF (DISPOSABLE) ×3 IMPLANT
STAPLER VISISTAT 35W (STAPLE) ×6 IMPLANT
SUT CHROMIC 4 0 PS 2 18 (SUTURE) ×6 IMPLANT
SUT ETHILON 2 0 FS 18 (SUTURE) ×4 IMPLANT
SUT MNCRL AB 4-0 PS2 18 (SUTURE) ×7 IMPLANT
SUT SILK 2 0 PERMA HAND 18 BK (SUTURE) ×2 IMPLANT
SUT VIC AB 0 CT2 27 (SUTURE) ×4 IMPLANT
SUT VIC AB 3-0 SH 18 (SUTURE) ×3 IMPLANT
SUT VIC AB 3-0 SH 27 (SUTURE) ×4
SUT VIC AB 3-0 SH 27X BRD (SUTURE) IMPLANT
SUT VIC AB 4-0 PS2 18 (SUTURE) ×2 IMPLANT
SUT VICRYL 4-0 PS2 18IN ABS (SUTURE) IMPLANT
SUT VLOC 180 0 24IN GS25 (SUTURE) ×2 IMPLANT
SYR BULB IRRIGATION 50ML (SYRINGE) ×3 IMPLANT
SYR CONTROL 10ML LL (SYRINGE) ×3 IMPLANT
TISSUE EXPNDR FV FOURTE 500 (Prosthesis & Implant Plastic) ×6 IMPLANT
TOWEL GREEN STERILE (TOWEL DISPOSABLE) ×6 IMPLANT
TOWEL GREEN STERILE FF (TOWEL DISPOSABLE) ×6 IMPLANT
TRAY FOLEY MTR SLVR 16FR STAT (SET/KITS/TRAYS/PACK) ×1 IMPLANT
TUBE CONNECTING 12X1/4 (SUCTIONS) ×3 IMPLANT

## 2019-02-07 NOTE — Interval H&P Note (Signed)
History and Physical Interval Note:  02/07/2019 6:56 AM  Christine Francis  has presented today for surgery, with the diagnosis of LEFT INVASIVE LOBULAR CARCINOMA,LEFT BREAST CANCER,.  The various methods of treatment have been discussed with the patient and family. After consideration of risks, benefits and other options for treatment, the patient has consented to  Procedure(s) with comments: BILATERAL MASTECTOMIES WITH LEFT SENTINEL LYMPH NODE BIOPSY (Bilateral) - PEC BLOCK BILATERAL BREAST RECONSTRUCTION WITH PLACEMENT OF TISSUE EXPANDER AND ALLODERM (Bilateral) as a surgical intervention.  The patient's history has been reviewed, patient examined, no change in status, stable for surgery.  I have reviewed the patient's chart and labs.  Questions were answered to the patient's satisfaction.     Arnoldo Hooker [name]

## 2019-02-07 NOTE — Anesthesia Procedure Notes (Signed)
Procedure Name: Intubation Date/Time: 02/07/2019 7:44 AM Performed by: Jenne Campus, CRNA Pre-anesthesia Checklist: Patient identified, Emergency Drugs available, Suction available and Patient being monitored Patient Re-evaluated:Patient Re-evaluated prior to induction Oxygen Delivery Method: Circle System Utilized Preoxygenation: Pre-oxygenation with 100% oxygen Induction Type: IV induction Ventilation: Mask ventilation without difficulty Laryngoscope Size: Miller and 2 Grade View: Grade II Tube type: Oral Number of attempts: 1 Airway Equipment and Method: Stylet Placement Confirmation: ETT inserted through vocal cords under direct vision,  positive ETCO2 and breath sounds checked- equal and bilateral Secured at: 21 cm Tube secured with: Tape Dental Injury: Teeth and Oropharynx as per pre-operative assessment

## 2019-02-07 NOTE — Anesthesia Preprocedure Evaluation (Addendum)
Anesthesia Evaluation  Patient identified by MRN, date of birth, ID band Patient awake    Reviewed: Allergy & Precautions, H&P , NPO status , Patient's Chart, lab work & pertinent test results  History of Anesthesia Complications (+) PONV and history of anesthetic complications  Airway Mallampati: II  TM Distance: >3 FB Neck ROM: full    Dental  (+) Teeth Intact, Dental Advisory Given   Pulmonary asthma ,    breath sounds clear to auscultation       Cardiovascular hypertension, Pt. on medications and Pt. on home beta blockers  Rhythm:regular Rate:Normal     Neuro/Psych PSYCHIATRIC DISORDERS Anxiety    GI/Hepatic GERD  Medicated and Controlled,  Endo/Other    Renal/GU      Musculoskeletal   Abdominal   Peds  Hematology   Anesthesia Other Findings   Reproductive/Obstetrics Breast Ca                            Anesthesia Physical Anesthesia Plan  ASA: II  Anesthesia Plan: General   Post-op Pain Management:  Regional for Post-op pain   Induction: Intravenous  PONV Risk Score and Plan: 4 or greater and Ondansetron, Dexamethasone, Midazolam, Scopolamine patch - Pre-op and Treatment may vary due to age or medical condition  Airway Management Planned: Oral ETT  Additional Equipment:   Intra-op Plan:   Post-operative Plan: Extubation in OR  Informed Consent: I have reviewed the patients History and Physical, chart, labs and discussed the procedure including the risks, benefits and alternatives for the proposed anesthesia with the patient or authorized representative who has indicated his/her understanding and acceptance.       Plan Discussed with: CRNA, Anesthesiologist and Surgeon  Anesthesia Plan Comments:         Anesthesia Quick Evaluation

## 2019-02-07 NOTE — Op Note (Signed)
Preop diagnosis: Invasive lobular carcinoma left breast, history of lobular carcinoma in situ right breast Postop diagnosis: Same Procedure performed: #1 left mastectomy   #2 left axillary sentinel lymph node biopsy   #3 blue dye injection   #4 right prophylactic mastectomy Surgeon: K  Co-surgeon: Dr. Celedonio Miyamoto Anesthesia: General/bilateral pectoral blocks Indications:This is a 58 year old female who is s/p three previous right breast lumpectomies. The first was in the 2000's and was excision of a fibroadenoma by Dr. Truitt Leep. In 2011, Dr. Rise Patience performed needle-localized lumpectomy in the [place], which showed ADH in a radial scar, LCIS. In 2016, she had another right upper outer quadrant breast mass, which I excised using radioactive seed localization. This showed LCIS with fibrocystic changes and UDH. She has been on Tamoxifen since that time.   Recently, she developed a palpable mass in the lower left breast. Mammogram showed 2 cm mass in the lower left breast. [place] measured this at 2.5 cm. Biopsy showed invasive lobular carcinoma, grade 2, Her2 negative, ER/PR +, Ki57 15%. Ultrasound showed no suspicious nodes. Genetics were negative.  After thorough discussion with the patient, she has opted for bilateral mastectomies with immediate reconstruction by Dr. Iran Planas  Description of procedure: In the preoperative area, bilateral pectoral blocks were placed by anesthesia.  She was injected with technetium sulfur colloid around the left nipple by radiology.  The patient was brought to the operating room and placed in the supine position on the operating room table with her arms extended.  After an adequate level of general anesthesia was obtained, incisions were marked by Dr. Iran Planas.  Her entire chest was prepped with ChloraPrep and draped in sterile fashion.  A timeout was taken to ensure the proper patient and proper procedure.  I injected methylene blue dye  solution around the left nipple.  We began our procedure on the right side which is the prophylactic mastectomy.  Using a #10 blade, I made the incisions following the marks by plastic surgery.  We raised skin flaps superiorly and inferiorly.  We dissected down to the inframammary crease inferiorly.  We dissected to the infraclavicular chest wall superiorly.  Medially we dissected to the edge of the sternum.  Laterally we dissected to the anterior edge of the latissimus.  We then used cautery to dissect the breast tissue off of the underlying pectoralis muscle.  We took the anterior layer of fascia.  We dissected all the way out to the axilla and remove the entire mastectomy specimen.  The specimen was oriented with a suture in the axilla.  We inspected carefully for hemostasis.  We irrigated thoroughly and packed a moist sponge into the wound.  We then turned our attention to the left side.  Using a #10 blade, I made the incisions following the marks by plastic surgery.  We raised skin flaps in similar fashion using the same landmarks.  We dissected out to the axilla.  Interrogated the axilla with the neoprobe.  We dissected into the axillary contents just below the edge of the pectoralis muscle.  I encountered a hot blue lymph node which was excised and sent as sentinel lymph node #1.  There is an additional second sentinel lymph node that was also excised and sent as sentinel lymph node #2.  There is minimal background activity.  I then dissected the breast off of the underlying fascia in similar fashion.  The entire specimen was removed and oriented with a suture in the axilla.  We inspected carefully  for hemostasis and irrigated thoroughly.  I then turned the surgery over to Dr. Iran Planas for reconstruction.  All sponge and instrument counts were correct upon my departure.  Imogene Burn. Georgette Dover, MD, [facility] Beeper [phone]  02/07/2019 9:26 AM

## 2019-02-07 NOTE — H&P (Signed)
History of Present Illness The patient is a 58 year old female who presents with breast cancer. Referred by Dr. Elige Radon [facility]) for left breast cancer PCP - [place]  This is a 58 year old female who is s/p three previous right breast lumpectomies. The first was in the 2000's and was excision of a fibroadenoma by Dr. Truitt Leep. In 2011, Dr. Rise Patience performed needle-localized lumpectomy in the [place], which showed ADH in a radial scar, LCIS. In 2016, she had another right upper outer quadrant breast mass, which I excised using radioactive seed localization. This showed LCIS with fibrocystic changes and UDH. She has been on Tamoxifen since that time.   Recently, she developed a palpable mass in the lower left breast. Mammogram showed 2 cm mass in the lower left breast. [place] measured this at 2.5 cm. Biopsy showed invasive lobular carcinoma, grade 2, Her2 negative, ER/PR +, Ki57 15%. Ultrasound showed no suspicious nodes.   The patient also recent had some atypia on a cervical Pap smear and is scheduled for endometrial biopsy tomorrow by Dr.  Saras. She remains on Tamoxifen. The patient is very, very anxious and refuses to undergo a MRI. She comes in today for surgical evaluation.   Diagnostic mammogram 11/30/18 - 2 cm irregular spiculated mass at 7:00.  Left breast US showed 2.5 cm lobulated mass at 5:00 with negative axillae. The patient had some type of genetic testing panel sent at Dr. Delanna Ahmadi office but the results are not yet available  She is not able to tolerate MRI due to extreme claustrophobia. She has decided to have bilateral mastectomies with reconstructions followed by Oncotype and Arimidex.She has an appointment with Dr. Iran Planas next week.   Allergies  Penicillins Dilaudid *ANALGESICS - OPIOID* Sulfa Antibiotics Allergies Reconciled  Medication History  Tamoxifen Citrate (20MG Tablet, Oral)  Active. Atenolol-Chlorthalidone (50-25MG Tablet, Oral) Active. Klor-Con M20 [facility] Tablet ER, Oral) Active. Montelukast Sodium (10MG Tablet, Oral) Active. Calcium "900" w/D (Oral) Active. Cholecalciferol Active. PriLOSEC OTC (20MG Tablet DR, Oral) Active. EpiPen 2-Pak (0.3MG/0.3ML Soln Auto-inj, Injection) Active. Breo Ellipta (100-25MCG/INH Aero Pow Br Act, Inhalation) Active. ALPRAZolam (0.25MG Tablet, Oral) Active. Medications Reconciled    Vitals  Weight: 190.6 lb Height: 66in Body Surface Area: 1.96 m Body Mass Index: 30.76 kg/m  Temp.: 98.77F(Temporal)  Pulse: 73 (Regular)  P.OX: 96% (Room air) BP: 120/78 (Sitting, Left Arm, Standard)        Physical Exam  The physical exam findings are as follows: Note:WDWN in NAD Eyes: Pupils equal, round; sclera anicteric HENT: Oral mucosa moist; good dentition Neck: No masses palpated, no thyromegaly Lungs: CTA bilaterally; normal respiratory effort Breasts: right breast - multiple healed incisions in RUOQ; no new palpable masses; no axillary lymphadenopathy left breast - 2.5 cm firm palpable mass at 6:00 about 5 cmfn; no axillary lymphadenopathy CV: Regular rate and rhythm; no murmurs; extremities well-perfused with no edema Abd: +bowel sounds, soft, non-tender, no palpable organomegaly; no palpable hernias Skin: Warm, dry; no sign of jaundice Psychiatric - alert and oriented x 4; calm mood and affect    Assessment & Plan   INVASIVE DUCTAL CARCINOMA OF BREAST, LEFT (C50.912)  Current Plans Schedule for Surgery - Left mastectomy and sentinel lymph node biopsy, right prophylactic mastectomy with bilateral immediate reconstruction. The surgical procedure has been discussed with the patient. Potential risks, benefits, alternative treatments, and expected outcomes have been explained. All of the patient's questions at this time have been answered. The likelihood of  reaching the patient's  treatment goal is good. The patient understand the proposed surgical procedure and wishes to proceed. Note:I spent approximately 45 minutes with the patient and her sister discussing her surgical options. She is obviously very anxious, but has already made up her mind that she wants bilateral mastectomies. We discussed conventional mastectomy, nipple-sparing mastectomy, and sentinel lymph node biopsies.   The surgical procedure has been discussed with the patient. Potential risks, benefits, alternative treatments, and expected outcomes have been explained. All of the patient's questions at this time have been answered. The likelihood of reaching the patient's treatment goal is good.The patient understand the proposed surgical procedure and wishes to proceed.   Imogene Burn. Georgette Dover, MD, [facility] Surgery  General/ Trauma Surgery Beeper [phone]  02/07/2019 7:16 AM

## 2019-02-07 NOTE — Transfer of Care (Signed)
Immediate Anesthesia Transfer of Care Note  Patient: Christine Francis  Procedure(s) Performed: BILATERAL MASTECTOMIES WITH LEFT SENTINEL LYMPH NODE BIOPSY (Bilateral Breast) BILATERAL BREAST RECONSTRUCTION WITH PLACEMENT OF TISSUE EXPANDER AND ALLODERM (Bilateral )  Patient Location: PACU  Anesthesia Type:General  Level of Consciousness: awake, oriented and patient cooperative  Airway & Oxygen Therapy: Patient Spontanous Breathing and Patient connected to face mask oxygen  Post-op Assessment: Report given to RN and Post -op Vital signs reviewed and stable  Post vital signs: Reviewed  Last Vitals:  Vitals Value Taken Time  BP 127/76 02/07/19 1126  Temp    Pulse 71 02/07/19 1128  Resp 10 02/07/19 1128  SpO2 96 % 02/07/19 1128  Vitals shown include unvalidated device data.  Last Pain:  Vitals:   02/07/19 0626  TempSrc:   PainSc: 0-No pain      Patients Stated Pain Goal: 3 (82/50/03 7048)  Complications: No apparent anesthesia complications

## 2019-02-07 NOTE — Assessment & Plan Note (Addendum)
Right lumpectomy 11/29/2014: LCIS with fibrocystic changes with usual ductal hyperplasia,PASH, 0.8 cm margins for LCIS, took Tamoxifen from 02/07/15-12/24/18  12/08/18: Left breast 2 cm mass at 7 o clock position: ILC grade 2, ER 90%, PR 30%, Her 2 neg, Ki 67: 15% T1CN0 stage 1A  Anxiety: sent prescription for Xanax  Treatment Plan:  1. Bilateral mastectomies (patient preference): 02/07/2019 Bilateral mastectomies with reconstruction (Tsuei, [place]) Right breast: atypical lobular hyperplasia and no evidence of carcinoma in 1 lymph node. Left breast: invasive lobular carcinoma with LCIS, grade 2, 2.1cm, 2 lymph nodes negative for carcinoma, and invasive carcinoma broadly present at the anterior margin.  2. Oncotype Dx 3. Adj Anastrozole  Dr.Tsuei is planning to take her for surgery to clear the anterior margin. Hopefully by then we will have Oncotype DX test results to see if she needs a port. We will obtain Oncotype DX testing and I will call her with the results of the test. Return to clinic based upon Oncotype test results.

## 2019-02-07 NOTE — Op Note (Addendum)
Operative Note   DATE OF OPERATION: 7.27.20  LOCATION: [facility]-outpatient  SURGICAL DIVISION: Plastic Surgery  PREOPERATIVE DIAGNOSES:  1. Left breast cancer LOQ ER+  POSTOPERATIVE DIAGNOSES:  same  PROCEDURE:  1. Bilateral breast reconstruction with tissue expanders 2. Acellular dermis (Alloderm) for breast reconstruction bilateral 600 cm2  SURGEON: [name] MD MBA  ASSISTANT: S Hitchcock RNFA  ANESTHESIA:  General.   EBL: 100 ml for entire procedure  COMPLICATIONS: None immediate.   INDICATIONS FOR PROCEDURE:  The patient, Christine Francis, is a 58 y.o. female born on 1961-04-03, is here for bilateral immediate prepectoral expander acellular dermis reconstruction following skin reduction pattern mastectomies. She has a history multiple prior lumpectomies.    FINDINGS: Natrelle 133S-FV-13-T 500 ml tissue expanders placed bilateral, RIGHT SN [id number] initial fill volume 375 ml air, LEFT SN [id number] initial fill volume 350 ml air  DESCRIPTION OF PROCEDURE:  The patient was marked with the patient in the preoperative area to mark sternal notch, chest midline, anterior axillary lines and inframammary folds. Patient was marked for skin reduction mastectomy with most superior portion nipple areola marked on breast meridian. Vertical limbs marked by breast displacement and set at9cm length. This included one prior lumpectomy scar over right breast, however right upper outer quadrant scar remains. The patient was taken to the operating room. SCDs were placed and IV antibiotics were given. Foley catheter placed. The patient's operative site was prepped and draped in a sterile fashion. A time out was performed and all information was confirmed to be correct. In supine position, the lateral limbs for resection marked and area over lower pole preserved as inferiorly based dermal pedicle. Skin de epithelialized in this area.I assisted in mastectomiesand sentinelnode dissectionwith  retraction and exposure.Following completion of mastectomies, reconstruction began onrightside.  The cavity was irrigated with solution containingAncef, gentamicin, andbacitracin. Hemostasis was ensured. A 19 Fr drain was placed in subcutaneous position laterally anda 15 Fr drain placed along inframammary fold. Eachsecured to skin with 2-0 nylon. Cavity irrigated with saline solution with Betadine.The tissue expanderswere prepared on back table prior in insertion. The expander was filled with air.Perforated acellular dermis was draped over anterior surface expander. The ADM was then secured to itself over posterior surface of expander. Redundant folds acellular dermis excised so that the ADM lay flat without folds over air filled expander.The expander was secured to medial insertion pectoralis with a 0 vicryl.The superior and lateral tabs also secured to pectoralis muscle with 0-vicryl. The ADM was secured to pectoralis muscle and chest wall along inferior border at inframammary fold.Laterally the mastectomy flap over posterior axillary line was advanced anteriorly and the subcutaneous tissue and superficial fascia was secured to chest wall with 0-vicryl. The inferiorly based dermal pedicle was redraped superiorly over expander and acellular dermis and secured to pectoralis with interrupted 0-vicryl. Skin closure completedwith 3-0 vicryl in fascial layer and 4-0 vicryl in dermis. Skin closure completed with 4-0 monocryl subcuticular and tissue adhesive.  I then directed my attention toleftchest where similar irrigation and drain placement completed. The prepared expander with ADM secured over anterior surface was placed in left chest and tabs secured to chest wall and pectoralis muscle with 0- vicryl suture. The acellular dermis at inframammary fold was secured to chest wall with 0 V-lock suture.Laterally the mastectomy flap over posterior axillary line was advanced anteriorly and the  subcutaneous tissue and superficial fascia was secured to chest wall with 0-vicryl. The inferiorly based dermal pedicle was redraped superiorly over expander and acellular  dermis and secured to pectoralis with interrupted 0-vicryl. Skin closure completedwith 3-0 vicryl in fascial layer and 4-0 vicryl in dermis. Skin closure completed with 4-0 monocryl subcuticular and tissue adhesive.Tegaderms applied bilateral, followed by dry dressing and breast binder.  The patient was allowed to wake from anesthesia, extubated and taken to the recovery room in satisfactory condition.   SPECIMENS: additional left breast tissue (lower outer quadrant)  DRAINS: 15 and 19 Fr JP in right and left breast reconstruction  [name], MD [facility] Plastic & Reconstructive Surgery [phone], pin [phone]

## 2019-02-07 NOTE — H&P (View-Only) (Signed)
History of Present Illness The patient is a 57 year old female who presents with breast cancer. Referred by Dr. John Faris ([place]) for left breast cancer PCP - [facility] [place] [facility] - [place]  This is a 57 year old female who is s/p three previous right breast lumpectomies. The first was in the 2000's and was excision of a fibroadenoma by Dr. [place]. In 2011, Dr. Weatherly performed needle-localized lumpectomy in the [place], which showed ADH in a radial scar, LCIS. In 2016, she had another right upper outer quadrant breast mass, which I excised using radioactive seed localization. This showed LCIS with fibrocystic changes and UDH. She has been on Tamoxifen since that time.   Recently, she developed a palpable mass in the lower left breast. Mammogram showed 2 cm mass in the lower left breast. US measured this at 2.5 cm.  Biopsy showed invasive lobular carcinoma, grade 2, Her2 negative, ER/PR +, Ki57 15%. Ultrasound showed no suspicious nodes.   The patient also recent had some atypia on a cervical Pap smear and is scheduled for endometrial biopsy tomorrow by Dr. Holland. She remains on Tamoxifen. The patient is very, very anxious and refuses to undergo a MRI. She comes in today for surgical evaluation.   Diagnostic mammogram 11/30/18 - 2 cm irregular spiculated mass at 7:00.  Left breast US showed 2.5 cm lobulated mass at 5:00 with negative axillae. The patient had some type of genetic testing panel sent at Dr. Holland's office but the results are not yet available  She is not able to tolerate MRI due to extreme claustrophobia. She has decided to have bilateral mastectomies with reconstructions followed by Oncotype and Arimidex.She has an appointment with Dr. Thimmappa next week.   Allergies  Penicillins Dilaudid *ANALGESICS - OPIOID* Sulfa Antibiotics Allergies Reconciled  Medication History  Tamoxifen Citrate (20MG Tablet, Oral)  Active. Atenolol-Chlorthalidone (50-25MG Tablet, Oral) Active. Klor-Con M20 (20MEQ Tablet ER, Oral) Active. Montelukast Sodium (10MG Tablet, Oral) Active. Calcium "900" w/D (Oral) Active. Cholecalciferol Active. PriLOSEC OTC (20MG Tablet DR, Oral) Active. EpiPen 2-Pak (0.3MG/0.3ML Soln Auto-inj, Injection) Active. Breo Ellipta (100-25MCG/INH Aero Pow Br Act, Inhalation) Active. ALPRAZolam (0.25MG Tablet, Oral) Active. Medications Reconciled    Vitals  Weight: 190.6 lb Height: 66in Body Surface Area: 1.96 m Body Mass Index: 30.76 kg/m  Temp.: 98.3F(Temporal)  Pulse: 73 (Regular)  P.OX: 96% (Room air) BP: 120/78 (Sitting, Left Arm, Standard)        Physical Exam  The physical exam findings are as follows: Note: WDWN in NAD Eyes: Pupils equal, round; sclera anicteric HENT: Oral mucosa moist; good dentition Neck: No masses palpated, no thyromegaly Lungs: CTA bilaterally; normal respiratory effort Breasts: right breast - multiple healed incisions in RUOQ; no new palpable masses; no axillary lymphadenopathy left breast - 2.5 cm firm palpable mass at 6:00 about 5 cmfn; no axillary lymphadenopathy CV: Regular rate and rhythm; no murmurs; extremities well-perfused with no edema Abd: +bowel sounds, soft, non-tender, no palpable organomegaly; no palpable hernias Skin: Warm, dry; no sign of jaundice Psychiatric - alert and oriented x 4; calm mood and affect    Assessment & Plan   INVASIVE DUCTAL CARCINOMA OF BREAST, LEFT (C50.912)  Current Plans Schedule for Surgery - Left mastectomy and sentinel lymph node biopsy, right prophylactic mastectomy with bilateral immediate reconstruction. The surgical procedure has been discussed with the patient. Potential risks, benefits, alternative treatments, and expected outcomes have been explained. All of the patient's questions at this time have been answered. The likelihood of   reaching the patient's  treatment goal is good. The patient understand the proposed surgical procedure and wishes to proceed. Note:I spent approximately 45 minutes with the patient and her sister discussing her surgical options. She is obviously very anxious, but has already made up her mind that she wants bilateral mastectomies. We discussed conventional mastectomy, nipple-sparing mastectomy, and sentinel lymph node biopsies.   The surgical procedure has been discussed with the patient. Potential risks, benefits, alternative treatments, and expected outcomes have been explained. All of the patient's questions at this time have been answered. The likelihood of reaching the patient's treatment goal is good.The patient understand the proposed surgical procedure and wishes to proceed.   Christine Tremain K. Viraj Liby, MD, FACS [facility]  General/ Trauma Surgery Beeper [phone]  02/07/2019 7:16 AM  

## 2019-02-08 DIAGNOSIS — Z170 Estrogen receptor positive status [ER+]: Secondary | ICD-10-CM | POA: Diagnosis not present

## 2019-02-08 DIAGNOSIS — J45909 Unspecified asthma, uncomplicated: Secondary | ICD-10-CM | POA: Diagnosis not present

## 2019-02-08 DIAGNOSIS — Z79899 Other long term (current) drug therapy: Secondary | ICD-10-CM | POA: Diagnosis not present

## 2019-02-08 DIAGNOSIS — C50512 Malignant neoplasm of lower-outer quadrant of left female breast: Secondary | ICD-10-CM | POA: Diagnosis not present

## 2019-02-08 DIAGNOSIS — Z4001 Encounter for prophylactic removal of breast: Secondary | ICD-10-CM | POA: Diagnosis not present

## 2019-02-08 DIAGNOSIS — Z853 Personal history of malignant neoplasm of breast: Secondary | ICD-10-CM | POA: Diagnosis not present

## 2019-02-08 DIAGNOSIS — I10 Essential (primary) hypertension: Secondary | ICD-10-CM | POA: Diagnosis not present

## 2019-02-08 DIAGNOSIS — K219 Gastro-esophageal reflux disease without esophagitis: Secondary | ICD-10-CM | POA: Diagnosis not present

## 2019-02-08 DIAGNOSIS — F419 Anxiety disorder, unspecified: Secondary | ICD-10-CM | POA: Diagnosis not present

## 2019-02-08 LAB — CBC
HCT: 35 % — ABNORMAL LOW (ref 36.0–46.0)
Hemoglobin: 11.3 g/dL — ABNORMAL LOW (ref 12.0–15.0)
MCH: 28.8 pg (ref 26.0–34.0)
MCHC: 32.3 g/dL (ref 30.0–36.0)
MCV: 89.3 fL (ref 80.0–100.0)
Platelets: 215 10*3/uL (ref 150–400)
RBC: 3.92 MIL/uL (ref 3.87–5.11)
RDW: 13.4 % (ref 11.5–15.5)
WBC: 13.8 10*3/uL — ABNORMAL HIGH (ref 4.0–10.5)
nRBC: 0 % (ref 0.0–0.2)

## 2019-02-08 LAB — BASIC METABOLIC PANEL
Anion gap: 8 (ref 5–15)
BUN: 25 mg/dL — ABNORMAL HIGH (ref 6–20)
CO2: 26 mmol/L (ref 22–32)
Calcium: 8.6 mg/dL — ABNORMAL LOW (ref 8.9–10.3)
Chloride: 103 mmol/L (ref 98–111)
Creatinine, Ser: 1.19 mg/dL — ABNORMAL HIGH (ref 0.44–1.00)
GFR calc Af Amer: 57 mL/min — ABNORMAL LOW (ref >60)
GFR calc non Af Amer: 50 mL/min — ABNORMAL LOW (ref >60)
Glucose, Bld: 131 mg/dL — ABNORMAL HIGH (ref 70–99)
Potassium: 3.5 mmol/L (ref 3.5–5.1)
Sodium: 137 mmol/L (ref 135–145)

## 2019-02-08 NOTE — Discharge Summary (Signed)
Physician Discharge Summary  Patient ID: Christine Francis MRN: [id number] DOB/AGE: 1960-08-28 58 y.o.  Admit date: 02/07/2019 Discharge date: 02/08/2019  Admission Diagnoses: Left breast cancer  Discharge Diagnoses:  same  Discharged Condition: stable  Hospital Course: Patient did well post operatively with pain controlled on oral medication, tolerating diet, and ambulatory with minimal assist. Instructed on drain care, showering.   Treatments: surgery: bilateral skin reduction pattern mastectomies, left SLN, bilateral breast reconstruction with tissue expanders acellular dermis 7.27.20  Discharge Exam: Blood pressure (!) 105/59, pulse 69, temperature 98 F (36.7 C), temperature source Oral, resp. rate 16, SpO2 99 %. Incision/Wound: chest soft bilateral incisions intact Tegaderms in place drains serosanguinous  Disposition: Discharge disposition: 01-Home or Self Care       Discharge Instructions    Call MD for:  redness, tenderness, or signs of infection (pain, swelling, bleeding, redness, odor or green/yellow discharge around incision site)   Complete by: As directed    Call MD for:  temperature >100.5   Complete by: As directed    Discharge instructions   Complete by: As directed    Ok to remove dressings and shower am 7.29.20. Soap and water ok, pat Tegaderms dry. Do not remove Tegaderms. No creams or ointments over incisions. Do not let drains dangle in shower, attach to lanyard or similar.Strip and record drains twice daily and bring log to clinic visit.  Breast binder or soft compression bra all other times.  Ok to raise arms above shoulders for bathing and dressing.  No house yard work or exercise until cleared by MD.   Recommend ibuprofen with meals as directed to aid with pain control. Ok to take Tylenol as directed for pain as well. Recommend Miralax or Dulcolax as needed for constipation.   Driving Restrictions   Complete by: As directed    No driving for 2  weeks, then no driving if taking narcotics   Lifting restrictions   Complete by: As directed    No lifting > 5 lbs until cleared by MD   Resume previous diet   Complete by: As directed      Allergies as of 02/08/2019      Reactions   Ampicillin Hives   Dilaudid [hydromorphone Hcl] Nausea And Vomiting   Erythromycin Hives, Other (See Comments)   And all other mycins   Penicillins Hives   Sulfa Drugs Cross Reactors Hives   Declomycin [demeclocycline] Rash, Other (See Comments)   Childhood allergy      Medication List    TAKE these medications   albuterol 108 (90 Base) MCG/ACT inhaler Commonly known as: VENTOLIN HFA Inhale 1-2 puffs into the lungs every 6 (six) hours as needed for wheezing or shortness of breath.   ALPRAZolam 0.25 MG tablet Commonly known as: XANAX Take 1 tablet (0.25 mg total) by mouth 3 (three) times daily as needed for anxiety.   atenolol-chlorthalidone 50-25 MG tablet Commonly known as: TENORETIC Take 1 tablet by mouth daily.   CALCIUM 600+D3 PO Take 2 tablets by mouth daily.   ciprofloxacin 500 MG tablet Commonly known as: Cipro Take 1 tablet (500 mg total) by mouth 2 (two) times daily for 6 days.   EPINEPHrine 0.3 mg/0.3 mL Soaj injection Commonly known as: EPI-PEN Inject 0.3 mg into the muscle as needed for anaphylaxis.   EQL Vitamin D3 50 MCG (2000 UT) Caps Generic drug: Cholecalciferol Take 2,000 Units by mouth daily.   ibuprofen 200 MG tablet Commonly known as: ADVIL Take 600 mg  by mouth every 6 (six) hours as needed for headache or moderate pain.   Klor-Con M20 20 MEQ tablet Generic drug: potassium chloride SA Take 20 mEq by mouth daily.   methocarbamol 500 MG tablet Commonly known as: Robaxin Take 1 tablet (500 mg total) by mouth every 8 (eight) hours as needed for muscle spasms.   montelukast 10 MG tablet Commonly known as: SINGULAIR Take 10 mg by mouth daily.   omeprazole 20 MG tablet Commonly known as: PRILOSEC OTC Take  20 mg by mouth daily.   oxyCODONE 5 MG immediate release tablet Commonly known as: Roxicodone Take 1 tablet (5 mg total) by mouth every 4 (four) hours as needed.   tamoxifen 20 MG tablet Commonly known as: NOLVADEX TAKE 1 TABLET BY MOUTH EVERY DAY      Follow-up Information    [name], MD In 1 week.   Specialty: Plastic Surgery Why: as scheduled Contact information: [address] [place] [address] [phone]        Donnie Mesa, MD. Schedule an appointment as soon as possible for a visit in 2 weeks.   Specialty: General Surgery Contact information: [address] [place] [address] [phone]           Signed: [name] 02/08/2019, 7:01 AM

## 2019-02-08 NOTE — Progress Notes (Signed)
Patient discharged to home. Verbalized understanding of all discharge instructions including incision care, drain care, discharge medications and follow up MD visits. Patient left unit with nurse tech via wheelchair with all personal belongings.

## 2019-02-08 NOTE — Anesthesia Postprocedure Evaluation (Signed)
Anesthesia Post Note  Patient: Christine Francis  Procedure(s) Performed: BILATERAL MASTECTOMIES WITH LEFT SENTINEL LYMPH NODE BIOPSY (Bilateral Breast) BILATERAL BREAST RECONSTRUCTION WITH PLACEMENT OF TISSUE EXPANDER AND ALLODERM (Bilateral )     Patient location during evaluation: PACU Anesthesia Type: General Level of consciousness: awake and alert Pain management: pain level controlled Vital Signs Assessment: post-procedure vital signs reviewed and stable Respiratory status: spontaneous breathing, nonlabored ventilation, respiratory function stable and patient connected to nasal cannula oxygen Cardiovascular status: blood pressure returned to baseline and stable Postop Assessment: no apparent nausea or vomiting Anesthetic complications: no    Last Vitals:  Vitals:   02/08/19 0109 02/08/19 0545  BP: 99/63 (!) 105/59  Pulse: (!) 52 69  Resp: 16 16  Temp: 36.6 C 36.7 C  SpO2: 97% 99%    Last Pain:  Vitals:   02/08/19 0545  TempSrc: Oral  PainSc:                  [place]

## 2019-02-09 ENCOUNTER — Ambulatory Visit: Payer: Self-pay | Admitting: Surgery

## 2019-02-09 ENCOUNTER — Encounter (HOSPITAL_COMMUNITY): Payer: Self-pay | Admitting: Surgery

## 2019-02-10 ENCOUNTER — Encounter: Payer: Self-pay | Admitting: *Deleted

## 2019-02-10 ENCOUNTER — Telehealth: Payer: Self-pay | Admitting: *Deleted

## 2019-02-10 DIAGNOSIS — Z170 Estrogen receptor positive status [ER+]: Secondary | ICD-10-CM | POA: Insufficient documentation

## 2019-02-10 DIAGNOSIS — C50312 Malignant neoplasm of lower-inner quadrant of left female breast: Secondary | ICD-10-CM | POA: Insufficient documentation

## 2019-02-10 NOTE — Telephone Encounter (Signed)
Received order for oncotype testing. Requisition faxed to pathology and [facility] °

## 2019-02-11 NOTE — Progress Notes (Signed)
Patient Care Team: Molli Posey, MD as PCP - General (Obstetrics and Gynecology)  DIAGNOSIS:    ICD-10-CM   1. Neoplasm of right breast, primary tumor staging category Tis: lobular carcinoma in situ (LCIS)  D05.01     SUMMARY OF ONCOLOGIC HISTORY: Oncology History  Neoplasm of right breast, primary tumor staging category Tis: lobular carcinoma in situ (LCIS)  07/19/2014 Initial Biopsy   Rt.Breast Biopsy: LCIS   11/29/2014 Surgery   Right lumpectomy: LCIS with fibrocystic changes with usual ductal hyperplasia,PASH, 0.8 cm margins for LCIS   02/06/2015 -  Anti-estrogen oral therapy   tamoxifen 20 mg daily   12/08/2018 Relapse/Recurrence   Left breast 2 cm mass at 7 o clock position: ILC grade 2, ER 90%, PR 30%, Her 2 neg, Ki 67: 15% T1CN0 stage 1A   02/07/2019 Surgery   Bilateral mastectomies with reconstruction (Tsuei, [place]ma[place]) Right breast: atypical lobular hyperplasia and no evidence of carcinoma in 1 lymph node. Left breast: invasive lobular carcinoma with LCIS, grade 2, 2.1cm, 2 lymph nodes negative for carcinoma, and invasive carcinoma broadly present at the anterior margin.    02/14/2019 Cancer Staging   Staging form: Breast, AJCC 7th Edition - Pathologic: Stage IIA (T2, N0, cM0) - Signed by Nicholas Lose, MD on 02/14/2019     CHIEF COMPLIANT: Follow-up s/p bilateral mastectomies to review pathology  INTERVAL HISTORY: Christine Francis is a 58 y.o. with above-mentioned history of right breast cancer with recent recurrence in the left breast. She underwent bilateral mastectomies with reconstruction on 02/07/19 with Dr. Georgette Dover and Dr. Iran Planas for which pathology showed in the right breast atypical lobular hyperplasia and no evidence of carcinoma in 1 lymph node. In the left breast, grade 2 invasive lobular carcinoma with LCIS, 2.1cm, 2 lymph nodes negative for carcinoma, and invasive carcinoma broadly present at the anterior margin. She presents to the clinic today to discuss  the pathology report and further treatment.   REVIEW OF SYSTEMS:   Constitutional: Denies fevers, chills or abnormal weight loss Eyes: Denies blurriness of vision Ears, nose, mouth, throat, and face: Denies mucositis or sore throat Respiratory: Denies cough, dyspnea or wheezes Cardiovascular: Denies palpitation, chest discomfort Gastrointestinal: Denies nausea, heartburn or change in bowel habits Skin: Denies abnormal skin rashes Lymphatics: Denies new lymphadenopathy or easy bruising Neurological: Denies numbness, tingling or new weaknesses Behavioral/Psych: Mood is stable, no new changes  Extremities: No lower extremity edema Breast: denies any pain or lumps or nodules in either breasts All other systems were reviewed with the patient and are negative.  I have reviewed the past medical history, past surgical history, social history and family history with the patient and they are unchanged from previous note.  ALLERGIES:  is allergic to ampicillin; dilaudid [hydromorphone hcl]; erythromycin; penicillins; sulfa drugs cross reactors; and declomycin [demeclocycline].  MEDICATIONS:  Current Outpatient Medications  Medication Sig Dispense Refill  . albuterol (PROVENTIL HFA;VENTOLIN HFA) 108 (90 BASE) MCG/ACT inhaler Inhale 1-2 puffs into the lungs every 6 (six) hours as needed for wheezing or shortness of breath.     . ALPRAZolam (XANAX) 0.25 MG tablet Take 1 tablet (0.25 mg total) by mouth 3 (three) times daily as needed for anxiety. 30 tablet 1  . atenolol-chlorthalidone (TENORETIC) 50-25 MG per tablet Take 1 tablet by mouth daily.      . Calcium Carb-Cholecalciferol (CALCIUM 600+D3 PO) Take 2 tablets by mouth daily.    . Cholecalciferol (EQL VITAMIN D3) 50 MCG (2000 UT) CAPS Take 2,000 Units by  mouth daily.    Marland Kitchen EPINEPHrine 0.3 mg/0.3 mL IJ SOAJ injection Inject 0.3 mg into the muscle as needed for anaphylaxis.    Marland Kitchen ibuprofen (ADVIL) 200 MG tablet Take 600 mg by mouth every 6 (six)  hours as needed for headache or moderate pain.    . methocarbamol (ROBAXIN) 500 MG tablet Take 1 tablet (500 mg total) by mouth every 8 (eight) hours as needed for muscle spasms. 30 tablet 0  . montelukast (SINGULAIR) 10 MG tablet Take 10 mg by mouth daily.     Marland Kitchen omeprazole (PRILOSEC OTC) 20 MG tablet Take 20 mg by mouth daily.    Marland Kitchen oxyCODONE (ROXICODONE) 5 MG immediate release tablet Take 1 tablet (5 mg total) by mouth every 4 (four) hours as needed. 40 tablet 0  . potassium chloride SA (KLOR-CON M20) 20 MEQ tablet Take 20 mEq by mouth daily.     . tamoxifen (NOLVADEX) 20 MG tablet TAKE 1 TABLET BY MOUTH EVERY DAY (Patient taking differently: Take 20 mg by mouth daily. ) 90 tablet 3   No current facility-administered medications for this visit.     PHYSICAL EXAMINATION: ECOG PERFORMANCE STATUS: 1 - Symptomatic but completely ambulatory  Vitals:   02/14/19 1406  BP: 126/71  Pulse: 77  Resp: 18  Temp: 97.8 F (36.6 C)  SpO2: 97%   Filed Weights   02/14/19 1406  Weight: 185 lb 1.6 oz (84 kg)    GENERAL: alert, no distress and comfortable SKIN: skin color, texture, turgor are normal, no rashes or significant lesions EYES: normal, Conjunctiva are pink and non-injected, sclera clear OROPHARYNX: no exudate, no erythema and lips, buccal mucosa, and tongue normal  NECK: supple, thyroid normal size, non-tender, without nodularity LYMPH: no palpable lymphadenopathy in the cervical, axillary or inguinal LUNGS: clear to auscultation and percussion with normal breathing effort HEART: regular rate & rhythm and no murmurs and no lower extremity edema ABDOMEN: abdomen soft, non-tender and normal bowel sounds MUSCULOSKELETAL: no cyanosis of digits and no clubbing  NEURO: alert & oriented x 3 with fluent speech, no focal motor/sensory deficits EXTREMITIES: No lower extremity edema  LABORATORY DATA:  I have reviewed the data as listed CMP Latest Ref Rng & Units 02/08/2019 02/03/2019 03/27/2015   Glucose 70 - 99 mg/dL 131(H) 105(H) 137  BUN 6 - 20 mg/dL 25(H) 15 16.2  Creatinine 0.44 - 1.00 mg/dL 1.19(H) 0.78 0.8  Sodium 135 - 145 mmol/L 137 140 141  Potassium 3.5 - 5.1 mmol/L 3.5 3.2(L) 3.4(L)  Chloride 98 - 111 mmol/L 103 102 -  CO2 22 - 32 mmol/L 26 25 29   Calcium 8.9 - 10.3 mg/dL 8.6(L) 9.4 9.6  Total Protein 6.4 - 8.3 g/dL - - 7.2  Total Bilirubin 0.20 - 1.20 mg/dL - - 0.73  Alkaline Phos 40 - 150 U/L - - 50  AST 5 - 34 U/L - - 19  ALT 0 - 55 U/L - - 25    Lab Results  Component Value Date   WBC 13.8 (H) 02/08/2019   HGB 11.3 (L) 02/08/2019   HCT 35.0 (L) 02/08/2019   MCV 89.3 02/08/2019   PLT 215 02/08/2019   NEUTROABS 4.5 03/27/2015    ASSESSMENT & PLAN:  Neoplasm of right breast, primary tumor staging category Tis: lobular carcinoma in situ (LCIS) Right lumpectomy 11/29/2014: LCIS with fibrocystic changes with usual ductal hyperplasia,PASH, 0.8 cm margins for LCIS, took Tamoxifen from 02/07/15-12/24/18  12/08/18: Left breast 2 cm mass at 7 o clock  position: ILC grade 2, ER 90%, PR 30%, Her 2 neg, Ki 67: 15% T1CN0 stage 1A  Anxiety: on Xanax  Treatment Plan:  1. Bilateral mastectomies (patient preference): 02/07/2019 Bilateral mastectomies with reconstruction (Tsuei, [place]) Right breast: atypical lobular hyperplasia and no evidence of carcinoma in 1 lymph node. Left breast: invasive lobular carcinoma with LCIS, grade 2, 2.1cm, 2 lymph nodes negative for carcinoma, and invasive carcinoma broadly present at the anterior margin.  Stage IIa 2. Oncotype Dx 3. Adj Anastrozole  Dr.Tsuei is planning to take her for surgery to clear the anterior margin. Hopefully by then we will have Oncotype DX test results to see if she needs a port. We will obtain Oncotype DX testing and I will call her with the results of the test. Return to clinic based upon Oncotype test results.    No orders of the defined types were placed in this encounter.  The patient has a good  understanding of the overall plan. she agrees with it. she will call with any problems that may develop before the next visit here.  Nicholas Lose, MD 02/14/2019  Julious Oka Dorshimer am acting as scribe for Dr. Nicholas Lose.  I have reviewed the above documentation for accuracy and completeness, and I agree with the above.

## 2019-02-14 ENCOUNTER — Telehealth: Payer: Self-pay | Admitting: Hematology and Oncology

## 2019-02-14 ENCOUNTER — Other Ambulatory Visit: Payer: Self-pay

## 2019-02-14 ENCOUNTER — Inpatient Hospital Stay: Payer: BC Managed Care – PPO | Attending: Hematology and Oncology | Admitting: Hematology and Oncology

## 2019-02-14 DIAGNOSIS — F419 Anxiety disorder, unspecified: Secondary | ICD-10-CM | POA: Diagnosis not present

## 2019-02-14 DIAGNOSIS — C50312 Malignant neoplasm of lower-inner quadrant of left female breast: Secondary | ICD-10-CM | POA: Diagnosis not present

## 2019-02-14 DIAGNOSIS — Z9013 Acquired absence of bilateral breasts and nipples: Secondary | ICD-10-CM | POA: Diagnosis not present

## 2019-02-14 DIAGNOSIS — D0501 Lobular carcinoma in situ of right breast: Secondary | ICD-10-CM | POA: Insufficient documentation

## 2019-02-14 NOTE — Telephone Encounter (Signed)
No 8/3 los, orders. Referrals.

## 2019-02-16 NOTE — H&P (Signed)
  Subjective:     Patient ID: Christine Francis is a 58 y.o. female.  HPI  1 week post op. Pain- tolerating with oxycodone Drains 1: 45/40 2: <10/10 3: 20/10 4: <10/10  First seen in consultation 2016. At that time, screening MMG revealed abnormalities that led to a biopsy with LCIS. She underwent lumpectomy at that time and has been on tamoxifen. Diagnostic MMG  11/2018 showed a 2 cm mass in the left breast. US showed the mass to be 2.5 cm at 5 o clock 5 cmfn, one small axillary node noted. Biopsy demonstrated invasive mammary carcinoma and invasive mammary carcinoma in situ, ER/PR+ HER2 -.   Patient has had prior lumpectomy 11/2009 for ADH. Prior fibroadenoma excision as well.   Final pathology 2.1 cm ILC with LCIS, invasive carcinoma broadly present at the anterior margin. Oncotype pending.  Genetics negative.  Patient has had difficulty in completing breast MRI due to claustrophobia.   Prior 73 D, desires to be smaller. 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 Sister lives in [place], she is IP OB RN with [employer].   Review of Systems     Objective:   Physical Exam  Cardiovascular: Normal rate, regular rhythm and normal heart sounds.  Pulmonary/Chest: Effort normal and breath sounds normal.   Chest: Tegaderms removed,  Incisions intact with 1 cm length epidermolysis right vertical incision Drains serosanguinous    Assessment:     Hx LCIS right breast s/p lumpectomy Left breast invasive cancer LOQ, ER+ S/p bilateral SRM, left SLN, prepectoral TE/ADM (Alloderm) reconstruction    Plan:     Copy of pathology provided. 15 Fr JPs removed. Continue compression light activities. Pictures today.  Oncotype pending. Scheduled for reexcision mastectomy flap left 8.13.20- Dr. Georgette Dover and I have discussed location positive anterior margin. This likely involves the buried dermal pedicle over lower pole breast. To  expose this will require opening of all incisions, exposure of TE and ADM. Reoperation during this acute inflammatory period post operatively will increase risks wound healing issues, possible exposure ADM and expander. Discussed options- plan removal left TE/ADM with reexcision mastectomy flap. Will have to await result of this to determine any role for RT.  Likely remove right remaining drain in OR, possible saline exchange. [name] out Agricultural consultant of mastectomy and where positive margin located.  Natrelle [id number] 500 ml tissue expanders placed bilateral,  RIGHT initial fill volume 375 ml air,  LEFT SN initial fill volume 350 ml air   [name], MD [facility] Plastic & Reconstructive Surgery [phone], pin [phone]

## 2019-02-17 ENCOUNTER — Encounter (HOSPITAL_BASED_OUTPATIENT_CLINIC_OR_DEPARTMENT_OTHER): Payer: Self-pay

## 2019-02-17 ENCOUNTER — Other Ambulatory Visit: Payer: Self-pay

## 2019-02-18 ENCOUNTER — Encounter (HOSPITAL_COMMUNITY): Payer: Self-pay | Admitting: Hematology and Oncology

## 2019-02-18 ENCOUNTER — Telehealth: Payer: Self-pay | Admitting: *Deleted

## 2019-02-18 NOTE — Telephone Encounter (Signed)
Received oncotype results of 24/10%.  Left message for a return phone call to give her results.

## 2019-02-21 ENCOUNTER — Other Ambulatory Visit (HOSPITAL_COMMUNITY)
Admission: RE | Admit: 2019-02-21 | Discharge: 2019-02-21 | Disposition: A | Discharge status: Home / Self Care | Payer: BC Managed Care – PPO | Source: Ambulatory Visit | Attending: Surgery | Admitting: Surgery

## 2019-02-21 DIAGNOSIS — Z20828 Contact with and (suspected) exposure to other viral communicable diseases: Secondary | ICD-10-CM | POA: Insufficient documentation

## 2019-02-21 DIAGNOSIS — C7981 Secondary malignant neoplasm of breast: Secondary | ICD-10-CM | POA: Insufficient documentation

## 2019-02-21 DIAGNOSIS — Z01812 Encounter for preprocedural laboratory examination: Secondary | ICD-10-CM | POA: Insufficient documentation

## 2019-02-21 LAB — SARS CORONAVIRUS 2 (TAT 6-24 HRS): SARS Coronavirus 2: NEGATIVE

## 2019-02-21 NOTE — Progress Notes (Signed)

## 2019-02-24 ENCOUNTER — Other Ambulatory Visit: Payer: Self-pay

## 2019-02-24 ENCOUNTER — Encounter (HOSPITAL_BASED_OUTPATIENT_CLINIC_OR_DEPARTMENT_OTHER)
Admission: RE | Disposition: A | Discharge status: Home / Self Care | Payer: Self-pay | Source: Home / Self Care | Attending: Surgery

## 2019-02-24 ENCOUNTER — Ambulatory Visit (HOSPITAL_BASED_OUTPATIENT_CLINIC_OR_DEPARTMENT_OTHER): Payer: BC Managed Care – PPO | Admitting: Certified Registered"

## 2019-02-24 ENCOUNTER — Encounter (HOSPITAL_BASED_OUTPATIENT_CLINIC_OR_DEPARTMENT_OTHER): Payer: Self-pay

## 2019-02-24 ENCOUNTER — Ambulatory Visit (HOSPITAL_BASED_OUTPATIENT_CLINIC_OR_DEPARTMENT_OTHER)
Admission: RE | Admit: 2019-02-24 | Discharge: 2019-02-24 | Disposition: A | Discharge status: Home / Self Care | Payer: BC Managed Care – PPO | Attending: Surgery | Admitting: Surgery

## 2019-02-24 DIAGNOSIS — Z79899 Other long term (current) drug therapy: Secondary | ICD-10-CM | POA: Insufficient documentation

## 2019-02-24 DIAGNOSIS — Z885 Allergy status to narcotic agent status: Secondary | ICD-10-CM | POA: Insufficient documentation

## 2019-02-24 DIAGNOSIS — Z7951 Long term (current) use of inhaled steroids: Secondary | ICD-10-CM | POA: Diagnosis not present

## 2019-02-24 DIAGNOSIS — I10 Essential (primary) hypertension: Secondary | ICD-10-CM | POA: Insufficient documentation

## 2019-02-24 DIAGNOSIS — C50512 Malignant neoplasm of lower-outer quadrant of left female breast: Secondary | ICD-10-CM | POA: Insufficient documentation

## 2019-02-24 DIAGNOSIS — C50912 Malignant neoplasm of unspecified site of left female breast: Secondary | ICD-10-CM | POA: Diagnosis not present

## 2019-02-24 DIAGNOSIS — K219 Gastro-esophageal reflux disease without esophagitis: Secondary | ICD-10-CM | POA: Insufficient documentation

## 2019-02-24 DIAGNOSIS — Z79810 Long term (current) use of selective estrogen receptor modulators (SERMs): Secondary | ICD-10-CM | POA: Diagnosis not present

## 2019-02-24 DIAGNOSIS — J45909 Unspecified asthma, uncomplicated: Secondary | ICD-10-CM | POA: Insufficient documentation

## 2019-02-24 DIAGNOSIS — Z880 Allergy status to penicillin: Secondary | ICD-10-CM | POA: Insufficient documentation

## 2019-02-24 DIAGNOSIS — Z9011 Acquired absence of right breast and nipple: Secondary | ICD-10-CM | POA: Diagnosis not present

## 2019-02-24 DIAGNOSIS — Z886 Allergy status to analgesic agent status: Secondary | ICD-10-CM | POA: Diagnosis not present

## 2019-02-24 DIAGNOSIS — Z853 Personal history of malignant neoplasm of breast: Secondary | ICD-10-CM | POA: Diagnosis not present

## 2019-02-24 DIAGNOSIS — Z170 Estrogen receptor positive status [ER+]: Secondary | ICD-10-CM | POA: Insufficient documentation

## 2019-02-24 DIAGNOSIS — N6092 Unspecified benign mammary dysplasia of left breast: Secondary | ICD-10-CM | POA: Diagnosis not present

## 2019-02-24 DIAGNOSIS — Z9013 Acquired absence of bilateral breasts and nipples: Secondary | ICD-10-CM | POA: Diagnosis not present

## 2019-02-24 HISTORY — PX: REMOVAL OF TISSUE EXPANDER AND PLACEMENT OF IMPLANT: SHX6457

## 2019-02-24 HISTORY — PX: TISSUE EXPANDER FILLING: SHX6698

## 2019-02-24 HISTORY — PX: RE-EXCISION OF BREAST LUMPECTOMY: SHX6048

## 2019-02-24 SURGERY — EXCISION, LESION, BREAST
Anesthesia: General | Site: Chest | Laterality: Right

## 2019-02-24 MED ORDER — OXYCODONE HCL 5 MG PO TABS
ORAL_TABLET | ORAL | Status: AC
  Filled 2019-02-24: qty 1

## 2019-02-24 MED ORDER — ACETAMINOPHEN 325 MG PO TABS: 325.0000 mg | ORAL_TABLET | ORAL | Status: DC | PRN

## 2019-02-24 MED ORDER — FENTANYL CITRATE (PF) 100 MCG/2ML IJ SOLN
INTRAMUSCULAR | Status: AC
  Filled 2019-02-24: qty 2

## 2019-02-24 MED ORDER — FENTANYL CITRATE (PF) 100 MCG/2ML IJ SOLN
50.0000 ug | INTRAMUSCULAR | Status: AC | PRN
  Administered 2019-02-24 (×3): 25 ug via INTRAVENOUS
  Administered 2019-02-24: 100 ug via INTRAVENOUS
  Administered 2019-02-24: 25 ug via INTRAVENOUS

## 2019-02-24 MED ORDER — OXYCODONE HCL 5 MG PO TABS: 5.0000 mg | ORAL_TABLET | Freq: Four times a day (QID) | ORAL | 0 refills | Status: DC | PRN

## 2019-02-24 MED ORDER — ACETAMINOPHEN 500 MG PO TABS
1000.0000 mg | ORAL_TABLET | ORAL | Status: AC
  Administered 2019-02-24: 1000 mg via ORAL

## 2019-02-24 MED ORDER — ACETAMINOPHEN 500 MG PO TABS
ORAL_TABLET | ORAL | Status: AC
  Filled 2019-02-24: qty 2

## 2019-02-24 MED ORDER — LACTATED RINGERS IV SOLN
INTRAVENOUS | Status: DC
  Administered 2019-02-24 (×2): via INTRAVENOUS

## 2019-02-24 MED ORDER — GABAPENTIN 300 MG PO CAPS
ORAL_CAPSULE | ORAL | Status: AC
  Filled 2019-02-24: qty 1

## 2019-02-24 MED ORDER — GABAPENTIN 300 MG PO CAPS
300.0000 mg | ORAL_CAPSULE | ORAL | Status: AC
  Administered 2019-02-24: 300 mg via ORAL

## 2019-02-24 MED ORDER — FENTANYL CITRATE (PF) 100 MCG/2ML IJ SOLN
25.0000 ug | INTRAMUSCULAR | Status: DC | PRN
  Administered 2019-02-24 (×2): 50 ug via INTRAVENOUS
  Administered 2019-02-24: 25 ug via INTRAVENOUS

## 2019-02-24 MED ORDER — MEPERIDINE HCL 25 MG/ML IJ SOLN: 6.2500 mg | INTRAMUSCULAR | Status: DC | PRN

## 2019-02-24 MED ORDER — SODIUM CHLORIDE 0.9 % IV SOLN
INTRAVENOUS | Status: DC | PRN
  Administered 2019-02-24: 1000 mL

## 2019-02-24 MED ORDER — CEFAZOLIN SODIUM-DEXTROSE 2-4 GM/100ML-% IV SOLN
INTRAVENOUS | Status: AC
  Filled 2019-02-24: qty 100

## 2019-02-24 MED ORDER — DEXAMETHASONE SODIUM PHOSPHATE 10 MG/ML IJ SOLN
INTRAMUSCULAR | Status: DC | PRN
  Administered 2019-02-24: 10 mg via INTRAVENOUS

## 2019-02-24 MED ORDER — EPHEDRINE SULFATE-NACL 50-0.9 MG/10ML-% IV SOSY
PREFILLED_SYRINGE | INTRAVENOUS | Status: DC | PRN
  Administered 2019-02-24: 10 mg via INTRAVENOUS

## 2019-02-24 MED ORDER — SCOPOLAMINE 1 MG/3DAYS TD PT72: 1.0000 | MEDICATED_PATCH | Freq: Once | TRANSDERMAL | Status: DC

## 2019-02-24 MED ORDER — CHLORHEXIDINE GLUCONATE CLOTH 2 % EX PADS: 6.0000 | MEDICATED_PAD | Freq: Once | CUTANEOUS | Status: DC

## 2019-02-24 MED ORDER — OXYCODONE HCL 5 MG/5ML PO SOLN: 5.0000 mg | Freq: Once | ORAL | Status: AC | PRN

## 2019-02-24 MED ORDER — ONDANSETRON HCL 4 MG/2ML IJ SOLN: 4.0000 mg | Freq: Once | INTRAMUSCULAR | Status: DC | PRN

## 2019-02-24 MED ORDER — MIDAZOLAM HCL 2 MG/2ML IJ SOLN
INTRAMUSCULAR | Status: AC
  Filled 2019-02-24: qty 2

## 2019-02-24 MED ORDER — OXYCODONE HCL 5 MG PO TABS
5.0000 mg | ORAL_TABLET | Freq: Once | ORAL | Status: AC | PRN
  Administered 2019-02-24: 5 mg via ORAL

## 2019-02-24 MED ORDER — ACETAMINOPHEN 160 MG/5ML PO SOLN: 325.0000 mg | ORAL | Status: DC | PRN

## 2019-02-24 MED ORDER — PROPOFOL 500 MG/50ML IV EMUL
INTRAVENOUS | Status: DC | PRN
  Administered 2019-02-24: 25 ug/kg/min via INTRAVENOUS

## 2019-02-24 MED ORDER — ONDANSETRON HCL 4 MG/2ML IJ SOLN
INTRAMUSCULAR | Status: DC | PRN
  Administered 2019-02-24: 4 mg via INTRAVENOUS

## 2019-02-24 MED ORDER — BUPIVACAINE-EPINEPHRINE (PF) 0.25% -1:200000 IJ SOLN
INTRAMUSCULAR | Status: AC
  Filled 2019-02-24: qty 30

## 2019-02-24 MED ORDER — CEFAZOLIN SODIUM-DEXTROSE 2-4 GM/100ML-% IV SOLN
2.0000 g | INTRAVENOUS | Status: AC
  Administered 2019-02-24: 2 g via INTRAVENOUS

## 2019-02-24 MED ORDER — MIDAZOLAM HCL 2 MG/2ML IJ SOLN
1.0000 mg | INTRAMUSCULAR | Status: DC | PRN
  Administered 2019-02-24: 2 mg via INTRAVENOUS

## 2019-02-24 MED ORDER — PROPOFOL 10 MG/ML IV BOLUS
INTRAVENOUS | Status: DC | PRN
  Administered 2019-02-24: 150 mg via INTRAVENOUS

## 2019-02-24 SURGICAL SUPPLY — 89 items
APPLIER CLIP 9.375 MED OPEN (MISCELLANEOUS)
BAG DECANTER FOR FLEXI CONT (MISCELLANEOUS) ×4 IMPLANT
BENZOIN TINCTURE PRP APPL 2/3 (GAUZE/BANDAGES/DRESSINGS) ×3 IMPLANT
BINDER BREAST LRG (GAUZE/BANDAGES/DRESSINGS) IMPLANT
BINDER BREAST MEDIUM (GAUZE/BANDAGES/DRESSINGS) IMPLANT
BINDER BREAST XLRG (GAUZE/BANDAGES/DRESSINGS) ×1 IMPLANT
BINDER BREAST XXLRG (GAUZE/BANDAGES/DRESSINGS) IMPLANT
BLADE CLIPPER SURG (BLADE) IMPLANT
BLADE HEX COATED 2.75 (ELECTRODE) ×4 IMPLANT
BLADE SURG 10 STRL SS (BLADE) ×4 IMPLANT
BLADE SURG 15 STRL LF DISP TIS (BLADE) ×3 IMPLANT
BLADE SURG 15 STRL SS (BLADE) ×1
BNDG GAUZE ELAST 4 BULKY (GAUZE/BANDAGES/DRESSINGS) ×6 IMPLANT
CANISTER SUCT 1200ML W/VALVE (MISCELLANEOUS) ×4 IMPLANT
CHLORAPREP W/TINT 26 (MISCELLANEOUS) ×8 IMPLANT
CLIP APPLIE 9.375 MED OPEN (MISCELLANEOUS) IMPLANT
COVER BACK TABLE REUSABLE LG (DRAPES) ×4 IMPLANT
COVER MAYO STAND REUSABLE (DRAPES) ×4 IMPLANT
COVER WAND RF STERILE (DRAPES) IMPLANT
DECANTER SPIKE VIAL GLASS SM (MISCELLANEOUS) ×4 IMPLANT
DERMABOND ADVANCED (GAUZE/BANDAGES/DRESSINGS) ×2
DERMABOND ADVANCED .7 DNX12 (GAUZE/BANDAGES/DRESSINGS) ×6 IMPLANT
DRAIN CHANNEL 15F RND FF W/TCR (WOUND CARE) IMPLANT
DRAIN CHANNEL 19F RND (DRAIN) ×4 IMPLANT
DRAIN HEMOVAC 1/8 X 5 (WOUND CARE) IMPLANT
DRAPE HALF SHEET 70X43 (DRAPES) ×5 IMPLANT
DRAPE LAPAROSCOPIC ABDOMINAL (DRAPES) IMPLANT
DRAPE SPLIT 6X30 W/TAPE (DRAPES) ×4 IMPLANT
DRAPE TOP ARMCOVERS (MISCELLANEOUS) ×4 IMPLANT
DRAPE UTILITY XL STRL (DRAPES) ×4 IMPLANT
DRSG PAD ABDOMINAL 8X10 ST (GAUZE/BANDAGES/DRESSINGS) ×8 IMPLANT
ELECT BLADE 4.0 EZ CLEAN MEGAD (MISCELLANEOUS)
ELECT COATED BLADE 2.86 ST (ELECTRODE) ×4 IMPLANT
ELECT REM PT RETURN 9FT ADLT (ELECTROSURGICAL) ×4
ELECTRODE BLDE 4.0 EZ CLN MEGD (MISCELLANEOUS) ×3 IMPLANT
ELECTRODE REM PT RTRN 9FT ADLT (ELECTROSURGICAL) ×3 IMPLANT
EVACUATOR SILICONE 100CC (DRAIN) ×4 IMPLANT
GAUZE SPONGE 4X4 12PLY STRL (GAUZE/BANDAGES/DRESSINGS) ×4 IMPLANT
GAUZE SPONGE 4X4 12PLY STRL LF (GAUZE/BANDAGES/DRESSINGS) IMPLANT
GLOVE BIO SURGEON STRL SZ 6 (GLOVE) ×8 IMPLANT
GLOVE BIO SURGEON STRL SZ7 (GLOVE) ×5 IMPLANT
GLOVE BIOGEL PI IND STRL 7.0 (GLOVE) IMPLANT
GLOVE BIOGEL PI IND STRL 7.5 (GLOVE) ×3 IMPLANT
GLOVE BIOGEL PI INDICATOR 7.0 (GLOVE) ×1
GLOVE BIOGEL PI INDICATOR 7.5 (GLOVE) ×2
GOWN STRL REUS W/ TWL LRG LVL3 (GOWN DISPOSABLE) ×6 IMPLANT
GOWN STRL REUS W/ TWL XL LVL3 (GOWN DISPOSABLE) IMPLANT
GOWN STRL REUS W/TWL LRG LVL3 (GOWN DISPOSABLE) ×3
GOWN STRL REUS W/TWL XL LVL3 (GOWN DISPOSABLE) ×1
IV NS 500ML (IV SOLUTION) ×1
IV NS 500ML BAXH (IV SOLUTION) ×3 IMPLANT
KIT FILL SYSTEM UNIVERSAL (SET/KITS/TRAYS/PACK) IMPLANT
KIT MARKER MARGIN INK (KITS) ×1 IMPLANT
MARKER SKIN DUAL TIP RULER LAB (MISCELLANEOUS) IMPLANT
NDL FILTER BLUNT 18X1 1/2 (NEEDLE) IMPLANT
NDL HYPO 25X1 1.5 SAFETY (NEEDLE) ×6 IMPLANT
NDL SAFETY ECLIPSE 18X1.5 (NEEDLE) ×3 IMPLANT
NEEDLE FILTER BLUNT 18X 1/2SAF (NEEDLE)
NEEDLE FILTER BLUNT 18X1 1/2 (NEEDLE) IMPLANT
NEEDLE HYPO 18GX1.5 SHARP (NEEDLE) ×1
NEEDLE HYPO 25X1 1.5 SAFETY (NEEDLE) IMPLANT
NS IRRIG 1000ML POUR BTL (IV SOLUTION) ×1 IMPLANT
PACK BASIN DAY SURGERY FS (CUSTOM PROCEDURE TRAY) ×4 IMPLANT
PENCIL BUTTON HOLSTER BLD 10FT (ELECTRODE) ×4 IMPLANT
PIN SAFETY STERILE (MISCELLANEOUS) ×4 IMPLANT
SLEEVE SCD COMPRESS KNEE MED (MISCELLANEOUS) ×4 IMPLANT
SPONGE LAP 18X18 RF (DISPOSABLE) ×7 IMPLANT
SPONGE LAP 4X18 RFD (DISPOSABLE) IMPLANT
STAPLER VISISTAT 35W (STAPLE) ×4 IMPLANT
STRIP CLOSURE SKIN 1/2X4 (GAUZE/BANDAGES/DRESSINGS) ×3 IMPLANT
SUT ETHILON 2 0 FS 18 (SUTURE) ×4 IMPLANT
SUT MNCRL AB 4-0 PS2 18 (SUTURE) ×5 IMPLANT
SUT MON AB 3-0 SH 27 (SUTURE) ×2
SUT MON AB 3-0 SH27 (SUTURE) IMPLANT
SUT PDS AB 2-0 CT2 27 (SUTURE) IMPLANT
SUT SILK 2 0 SH (SUTURE) ×1 IMPLANT
SUT VIC AB 3-0 PS1 18 (SUTURE)
SUT VIC AB 3-0 PS1 18XBRD (SUTURE) IMPLANT
SUT VIC AB 3-0 SH 27 (SUTURE) ×1
SUT VIC AB 3-0 SH 27X BRD (SUTURE) ×3 IMPLANT
SUT VICRYL 3-0 CR8 SH (SUTURE) IMPLANT
SUT VICRYL 4-0 PS2 18IN ABS (SUTURE) ×4 IMPLANT
SYR 20ML LL LF (SYRINGE) IMPLANT
SYR BULB IRRIGATION 50ML (SYRINGE) ×8 IMPLANT
SYR CONTROL 10ML LL (SYRINGE) ×4 IMPLANT
TOWEL GREEN STERILE FF (TOWEL DISPOSABLE) ×8 IMPLANT
TUBE CONNECTING 20X1/4 (TUBING) ×8 IMPLANT
UNDERPAD 30X30 (UNDERPADS AND DIAPERS) ×8 IMPLANT
YANKAUER SUCT BULB TIP NO VENT (SUCTIONS) ×4 IMPLANT

## 2019-02-24 NOTE — Anesthesia Procedure Notes (Signed)
Procedure Name: LMA Insertion Date/Time: 02/24/2019 2:08 PM Performed by: Lyndee Leo, CRNA Pre-anesthesia Checklist: Patient identified, Emergency Drugs available, Suction available and Patient being monitored Patient Re-evaluated:Patient Re-evaluated prior to induction Oxygen Delivery Method: Circle system utilized Preoxygenation: Pre-oxygenation with 100% oxygen Induction Type: IV induction Ventilation: Mask ventilation without difficulty LMA: LMA inserted LMA Size: 4.0 Number of attempts: 1 Airway Equipment and Method: Bite block Placement Confirmation: positive ETCO2 Tube secured with: Tape Dental Injury: Teeth and Oropharynx as per pre-operative assessment

## 2019-02-24 NOTE — Op Note (Signed)
Preop diagnosis: Positive anterior margin left mastectomy Postop diagnosis: Same Procedure performed: Excision of lower anterior dermal flap left mastectomy Surgeon:Elzie Knisley K Veronica Guerrant Co-surgeon: Dr. Celedonio Miyamoto Anesthesia: General Indications: This is a 58 year old female who recently underwent prophylactic right mastectomy and left simple mastectomy with sentinel lymph node biopsy for invasive lobular carcinoma of the left breast at 7:00.  She underwent immediate reconstruction with tissue expanders and acellular dermis by Dr. Iran Planas.  Unfortunately, the anterior margin of the left mastectomy site was broadly positive  Th the original cancer was located in the lower part of the breast below the level of the nipple centered at 7:00.  After discussion with the patient, we made the decision to return to the operating room to remove the tissue expander on the left and to excise the lower dermal flap.  This lower flap had been de-epithelialized and was deep to another skin flap that had been rotated from the upper medial portion of the left breast.  Description of procedure: The patient was brought to the operating room and placed in the supine position on the operating room table.  After an adequate level of general anesthesia was obtained her entire chest was prepped with ChloraPrep and draped in sterile fashion.  A timeout was taken to ensure the proper patient and proper procedure.  Dr. Iran Planas began by opening her previous incision with the left breast reconstruction.  As she raised the medial skin flap we encountered the deep dermal flap.  She opened her incision widely and remove the tissue expander.  Her portion of the case will be dictated separately.  I then used cautery to excise the lower dermal flap down to the inframammary crease.  I took this in 2 sections but I was able to suture the appropriate flaps back together in proper orientation.  We then oriented the entire specimen with a paint  kit.  This was sent for pathologic examination.  Dr. Iran Planas then took over the rest of the case to close the wound and to place a drain.  Again, her portion of the case will be dictated separately.  Christine Francis. Georgette Dover, MD, [facility] Beeper [phone]  02/24/2019 2:45 PM

## 2019-02-24 NOTE — Interval H&P Note (Signed)
History and Physical Interval Note:  02/24/2019 1:51 PM  Christine Francis  has presented today for surgery, with the diagnosis of INVASIVE LOBULAR CARCINOMA - LEFT BREAST/POSITIVE ANTERIOR MARGIN.  The various methods of treatment have been discussed with the patient and family. After consideration of risks, benefits and other options for treatment, the patient has consented to  Procedure(s): REEXCISION LEFT MASTECTOMY- LEFT DERMAL FLAP (Left) REMOVAL OF LEFT CHEST TISSUE EXPANDER WITH ALLODERM (Left) as a surgical intervention.  The patient's history has been reviewed, patient examined, no change in status, stable for surgery.  I have reviewed the patient's chart and labs.  Questions were answered to the patient's satisfaction.    After her previous mastectomy, the anterior margin in the lower medial left breast was positive.  This necessitates excision of the skin flap and removal of the tissue expander.  The surgical procedure has been discussed with the patient.  Potential risks, benefits, alternative treatments, and expected outcomes have been explained.  All of the patient's questions at this time have been answered.  The likelihood of reaching the patient's treatment goal is good.  The patient understand the proposed surgical procedure and wishes to proceed.Maia Petties

## 2019-02-24 NOTE — Discharge Instructions (Signed)
No Tylenol until 7:00pm!    Post Anesthesia Home Care Instructions  Activity: Get plenty of rest for the remainder of the day. A responsible individual must stay with you for 24 hours following the procedure.  For the next 24 hours, DO NOT: -Drive a car -Paediatric nurse -Drink alcoholic beverages -Take any medication unless instructed by your physician -Make any legal decisions or sign important papers.  Meals: Start with liquid foods such as gelatin or soup. Progress to regular foods as tolerated. Avoid greasy, spicy, heavy foods. If nausea and/or vomiting occur, drink only clear liquids until the nausea and/or vomiting subsides. Call your physician if vomiting continues.  Special Instructions/Symptoms: Your throat may feel dry or sore from the anesthesia or the breathing tube placed in your throat during surgery. If this causes discomfort, gargle with warm salt water. The discomfort should disappear within 24 hours.  If you had a scopolamine patch placed behind your ear for the management of post- operative nausea and/or vomiting:  1. The medication in the patch is effective for 72 hours, after which it should be removed.  Wrap patch in a tissue and discard in the trash. Wash hands thoroughly with soap and water. 2. You may remove the patch earlier than 72 hours if you experience unpleasant side effects which may include dry mouth, dizziness or visual disturbances. 3. Avoid touching the patch. Wash your hands with soap and water after contact with the patch.      About my Jackson-Pratt Bulb Drain  What is a Jackson-Pratt bulb? A Jackson-Pratt is a soft, round device used to collect drainage. It is connected to a long, thin drainage catheter, which is held in place by one or two small stiches near your surgical incision site. When the bulb is squeezed, it forms a vacuum, forcing the drainage to empty into the bulb.  Emptying the Jackson-Pratt bulb- To empty the bulb: 1. Release  the plug on the top of the bulb. 2. Pour the bulb's contents into a measuring container which your nurse will provide. 3. Record the time emptied and amount of drainage. Empty the drain(s) as often as your     doctor or nurse recommends.  Date                  Time                    Amount (Drain 1)                 Amount (Drain 2)  _____________________________________________________________________  _____________________________________________________________________  _____________________________________________________________________  _____________________________________________________________________  _____________________________________________________________________  _____________________________________________________________________  _____________________________________________________________________  _____________________________________________________________________  Squeezing the Jackson-Pratt Bulb- To squeeze the bulb: 1. Make sure the plug at the top of the bulb is open. 2. Squeeze the bulb tightly in your fist. You will hear air squeezing from the bulb. 3. Replace the plug while the bulb is squeezed. 4. Use a safety pin to attach the bulb to your clothing. This will keep the catheter from     pulling at the bulb insertion site.  When to call your doctor- Call your doctor if:  Drain site becomes red, swollen or hot.  You have a fever greater than 101 degrees F.  There is oozing at the drain site.  Drain falls out (apply a guaze bandage over the drain hole and secure it with tape).  Drainage increases daily not related to activity patterns. (You will usually have more drainage when you are active  than when you are resting.)  Drainage has a bad odor.

## 2019-02-24 NOTE — Op Note (Signed)
Operative Note   DATE OF OPERATION: 8.13.20  LOCATION: [facility]-outpatient  SURGICAL DIVISION: Plastic Surgery  PREOPERATIVE DIAGNOSES:  1. Left breast cancer LOQ ER+ 2. Acquired absence breasts  POSTOPERATIVE DIAGNOSES:  same  PROCEDURE:  1. Removal left chest tissue expander and acellular dermis 2. Tissue expansion right chest  SURGEON: [name] MD MBA  ASSISTANT: none  ANESTHESIA:  General.   EBL: 50 ml for entire procedure.  COMPLICATIONS: None immediate.   INDICATIONS FOR PROCEDURE:  The patient, Christine Francis, is a 58 y.o. female born on 11/28/1960, is here for reexcision left chest mastectomy flap for positive anterior margin. Patient is post operative from bilateral skin reduction pattern mastectomies with immediate prepectoral acellular dermis and expander reconstruction. The positive margin represents buried soft tissue dermal pedicle over lower pole expander. We have discussed risks exposure, wound healing problems in light of exposure ADM and expander, reoperation and plan removal expander and ADM on left.   FINDINGS: Right chest tissue expander saline exchange completed, fill volume 480 ml. Removed right chest remaining drain.   DESCRIPTION OF PROCEDURE:  The patient's operative site was marked with the patient in the preoperative area. The patient was taken to the operating room. SCDs were placed and IV antibiotics were given. The patient's operative site was prepped and draped in a sterile fashion. A time out was performed and all information was confirmed to be correct. Incisions made in prior left chest skin reduction pattern scars. Medial and lateral skin flaps elevated off underlying inferiorly based dermal pedicle and expander. Please see Dr. Georgette Dover note for description reexcision. Expander and ADM removed in entirety. Cavity irrigated. New 19 Fr JP placed and secured with 2-0 nylon. Closure completed with 3-0 monocryl in superficial fascia, 4-0  monocryl skin subcuticular closure.  Over right chest, magnet used to identify port. Port access and air evacuated. Saline infiltrated to 480 ml fill volume. Superficial eschar right vertical incision removed and simple closure completed with running 4-0 monocryl. Right JP drain removed.  Dry dressing applied to drain site. Dermabond applied to chest incisions. Dry dressing and breast binder applied.  The patient was allowed to wake from anesthesia, extubated and taken to the recovery room in satisfactory condition.   SPECIMENS: none  DRAINS: 19 Fr JP in left chest  [name], MD [facility] [phone], pin [phone]

## 2019-02-24 NOTE — Anesthesia Preprocedure Evaluation (Signed)
Anesthesia Evaluation  Patient identified by MRN, date of birth, ID band Patient awake    Reviewed: Allergy & Precautions, H&P , NPO status , Patient's Chart, lab work & pertinent test results  History of Anesthesia Complications (+) PONV and history of anesthetic complications  Airway Mallampati: II  TM Distance: >3 FB Neck ROM: full    Dental  (+) Teeth Intact, Dental Advisory Given   Pulmonary asthma ,    breath sounds clear to auscultation       Cardiovascular hypertension, Pt. on medications and Pt. on home beta blockers  Rhythm:regular Rate:Normal     Neuro/Psych PSYCHIATRIC DISORDERS Anxiety    GI/Hepatic GERD  Medicated and Controlled,  Endo/Other    Renal/GU      Musculoskeletal   Abdominal   Peds  Hematology   Anesthesia Other Findings   Reproductive/Obstetrics Breast Ca                            Anesthesia Physical  Anesthesia Plan  ASA: II  Anesthesia Plan: General   Post-op Pain Management:    Induction: Intravenous  PONV Risk Score and Plan: 4 or greater and Ondansetron, Dexamethasone, Midazolam, Scopolamine patch - Pre-op and Treatment may vary due to age or medical condition  Airway Management Planned: LMA  Additional Equipment:   Intra-op Plan:   Post-operative Plan:   Informed Consent: I have reviewed the patients History and Physical, chart, labs and discussed the procedure including the risks, benefits and alternatives for the proposed anesthesia with the patient or authorized representative who has indicated his/her understanding and acceptance.       Plan Discussed with: CRNA, Anesthesiologist and Surgeon  Anesthesia Plan Comments:         Anesthesia Quick Evaluation  

## 2019-02-24 NOTE — Interval H&P Note (Signed)
History and Physical Interval Note:  02/24/2019 1:09 PM  Christine Francis  has presented today for surgery, with the diagnosis of INVASIVE LOBULAR CARCINOMA - LEFT BREAST/POSITIVE ANTERIOR MARGIN.  The various methods of treatment have been discussed with the patient and family. After consideration of risks, benefits and other options for treatment, the patient has consented to  Procedure(s): REEXCISION LEFT MASTECTOMY- LEFT DERMAL FLAP (Left) REMOVAL OF LEFT CHEST TISSUE EXPANDER WITH ALLODERM (Left) as a surgical intervention.  The patient's history has been reviewed, patient examined, no change in status, stable for surgery.  I have reviewed the patient's chart and labs.  Questions were answered to the patient's satisfaction.     Arnoldo Hooker Tenita Cue

## 2019-02-24 NOTE — Transfer of Care (Signed)
Immediate Anesthesia Transfer of Care Note  Patient: Christine Francis  Procedure(s) Performed: RE-EXCISION LEFT MASTECTOMY- LEFT DERMAL FLAP (Left Breast) REMOVAL OF LEFT CHEST TISSUE EXPANDER WITH ALLODERM (Left Chest) RIGHT CHEST TISSUE EXPANDER FILLING (Right Breast)  Patient Location: PACU  Anesthesia Type:General  Level of Consciousness: awake, sedated and patient cooperative  Airway & Oxygen Therapy: Patient Spontanous Breathing and Patient connected to nasal cannula oxygen  Post-op Assessment: Report given to RN and Post -op Vital signs reviewed and stable  Post vital signs: Reviewed and stable  Last Vitals:  Vitals Value Taken Time  BP 110/71 02/24/19 1520  Temp    Pulse 83 02/24/19 1522  Resp 12 02/24/19 1522  SpO2 98 % 02/24/19 1522  Vitals shown include unvalidated device data.  Last Pain:  Vitals:   02/24/19 1250  TempSrc:   PainSc: 3       Patients Stated Pain Goal: 3 (02/24/87 7195)  Complications: No apparent anesthesia complications

## 2019-02-25 NOTE — Anesthesia Postprocedure Evaluation (Signed)
Anesthesia Post Note  Patient: Christine Francis  Procedure(s) Performed: RE-EXCISION LEFT MASTECTOMY- LEFT DERMAL FLAP (Left Breast) REMOVAL OF LEFT CHEST TISSUE EXPANDER WITH ALLODERM (Left Chest) RIGHT CHEST TISSUE EXPANDER FILLING (Right Chest)     Patient location during evaluation: PACU Anesthesia Type: General Level of consciousness: awake and alert Pain management: pain level controlled Vital Signs Assessment: post-procedure vital signs reviewed and stable Respiratory status: spontaneous breathing, nonlabored ventilation, respiratory function stable and patient connected to nasal cannula oxygen Cardiovascular status: blood pressure returned to baseline and stable Postop Assessment: no apparent nausea or vomiting Anesthetic complications: no    Last Vitals:  Vitals:   02/24/19 1630 02/24/19 1714  BP: 117/75 (P) 121/71  Pulse: 67 (P) 69  Resp: 13 (P) 16  Temp:  (P) 37 C  SpO2: 98% (P) 96%    Last Pain:  Vitals:   02/24/19 1714  TempSrc:   PainSc: (P) 5                  Montez Hageman

## 2019-02-28 ENCOUNTER — Encounter (HOSPITAL_BASED_OUTPATIENT_CLINIC_OR_DEPARTMENT_OTHER): Payer: Self-pay | Admitting: Surgery

## 2019-03-01 ENCOUNTER — Telehealth: Payer: Self-pay | Admitting: Adult Health

## 2019-03-01 ENCOUNTER — Telehealth: Payer: Self-pay | Admitting: *Deleted

## 2019-03-01 MED ORDER — ANASTROZOLE 1 MG PO TABS: 1.0000 mg | ORAL_TABLET | Freq: Every day | ORAL | 3 refills | Status: DC

## 2019-03-01 NOTE — Telephone Encounter (Signed)
Scheduled appt per 8/18 sch message - pt is aware of appt date and time   

## 2019-03-01 NOTE — Telephone Encounter (Signed)
Left vm for pt to return call to discuss starting on anastrozole. Contact information provided.

## 2019-03-02 ENCOUNTER — Encounter: Payer: Self-pay | Admitting: Adult Health

## 2019-03-02 ENCOUNTER — Telehealth: Payer: Self-pay | Admitting: *Deleted

## 2019-03-02 NOTE — Telephone Encounter (Signed)
Spoke to pt concerning anastrozole. Pt will start taking today. Gave instructions and discussed s/e. Denies further questions or needs at this time.

## 2019-03-08 DIAGNOSIS — C50912 Malignant neoplasm of unspecified site of left female breast: Secondary | ICD-10-CM | POA: Diagnosis not present

## 2019-03-10 ENCOUNTER — Encounter: Payer: Self-pay | Admitting: Rehabilitation

## 2019-03-10 ENCOUNTER — Ambulatory Visit: Payer: BC Managed Care – PPO | Attending: Plastic Surgery | Admitting: Rehabilitation

## 2019-03-10 ENCOUNTER — Other Ambulatory Visit: Payer: Self-pay

## 2019-03-10 DIAGNOSIS — M25611 Stiffness of right shoulder, not elsewhere classified: Secondary | ICD-10-CM | POA: Diagnosis not present

## 2019-03-10 DIAGNOSIS — R293 Abnormal posture: Secondary | ICD-10-CM | POA: Diagnosis not present

## 2019-03-10 DIAGNOSIS — M25612 Stiffness of left shoulder, not elsewhere classified: Secondary | ICD-10-CM | POA: Insufficient documentation

## 2019-03-10 DIAGNOSIS — Z9013 Acquired absence of bilateral breasts and nipples: Secondary | ICD-10-CM | POA: Insufficient documentation

## 2019-03-10 NOTE — Patient Instructions (Signed)
Supine cane flexion x 10 Supine or seated butterfly stretch 5x5" Scapular retractionx 10 Diaphragmatic breathing work

## 2019-03-10 NOTE — Therapy (Signed)
[place], [place], [address] Phone: [phone]   Fax:  [phone]  Physical Therapy Evaluation  Patient Details  Name: Christine Francis MRN: [id number] Date of Birth: [birth year] Referring Provider (PT): Dr. Iran Planas   Encounter Date: 03/10/2019  PT End of Session - 03/10/19 1357    Visit Number  1    Number of Visits  9    Date for PT Re-Evaluation  04/07/19    Authorization Type  [employer] [place] of [place]    Authorization - Number of Visits  60    PT Start Time  1130    PT Stop Time  1216    PT Time Calculation (min)  46 min    Activity Tolerance  Patient tolerated treatment well    Behavior During Therapy  [facility] for tasks assessed/performed       Past Medical History:  Diagnosis Date  . Allergy    allergy shots weekly  . Anxiety    no meds currently  . Asthma   . Cancer ([place])    skin- basil cancer face, chest and left leg  . GERD (gastroesophageal reflux disease)   . Hyperlipidemia    diet controlled, no meds  . Hypertension   . IBS (irritable bowel syndrome)    no problems currently  . Personal history of colonic adenomas 11/17/2005   11/17/2005 - 3 adenomas - one  A TV adenoma was 12 mm (max)  . PONV (postoperative nausea and vomiting)     Past Surgical History:  Procedure Laterality Date  . BIOPSY BREAST  2000   right; benign  . BREAST BIOPSY  2012   right breast; pre cancerous  . BREAST LUMPECTOMY Right 04/1999, 11/2009  . BREAST LUMPECTOMY WITH RADIOACTIVE SEED LOCALIZATION Right 11/29/2014   Procedure: BREAST LUMPECTOMY WITH RADIOACTIVE SEED LOCALIZATION;  Surgeon: Donnie Mesa, MD;  Location: [place];  Service: General;  Laterality: Right;  . BREAST RECONSTRUCTION WITH PLACEMENT OF TISSUE EXPANDER AND ALLODERM Bilateral 02/07/2019   Procedure: BILATERAL BREAST RECONSTRUCTION WITH PLACEMENT OF TISSUE EXPANDER AND ALLODERM;  Surgeon: [name], MD;  Location:  [place];  Service: Plastics;  Laterality: Bilateral;  . BUNIONECTOMY  1986   bilateral  . cervical cryosurgery  12/2013  . COLONOSCOPY     x 2  . DIAGNOSTIC LAPAROSCOPY    . laproscopy  1998  . MASTECTOMY W/ SENTINEL NODE BIOPSY Bilateral 02/07/2019   Procedure: BILATERAL MASTECTOMIES WITH LEFT SENTINEL LYMPH NODE BIOPSY;  Surgeon: Donnie Mesa, MD;  Location: [place];  Service: General;  Laterality: Bilateral;  PEC BLOCK  . PARATHYROIDECTOMY  2005  . RE-EXCISION OF BREAST LUMPECTOMY Left 02/24/2019   Procedure: RE-EXCISION LEFT MASTECTOMY- LEFT DERMAL FLAP;  Surgeon: Donnie Mesa, MD;  Location: [place];  Service: General;  Laterality: Left;  . REMOVAL OF TISSUE EXPANDER AND PLACEMENT OF IMPLANT Left 02/24/2019   Procedure: REMOVAL OF LEFT CHEST TISSUE EXPANDER WITH ALLODERM;  Surgeon: [name], MD;  Location: [place];  Service: Plastics;  Laterality: Left;  . TISSUE EXPANDER FILLING Right 02/24/2019   Procedure: RIGHT CHEST TISSUE EXPANDER FILLING;  Surgeon: [name], MD;  Location: [place];  Service: Plastics;  Laterality: Right;  (495m 0.9% Normal Saline)  . uterine bx     thickening of uterus was the reason for havin bx  . WISDOM TOOTH EXTRACTION      There were no vitals filed for this  visit.   Subjective Assessment - 03/10/19 1131    Subjective  I am just raw.  I saw her yesterday and I feel like I am doing okay.    Pertinent History  Rt lumpectomy x 3 on the right last one 11/29/2014 LCIS with fibrocystic changes recurrence  on the left 12/08/18 ILC ER/PR positive, HER2 negative with bilateral mastectomy on 02/07/19 by Dr. Gershon Crane with immediate reconstruction by Dr. Iran Planas. Rt breast no carcinoma.  Lt breast with 2 lymph nodes negative. Re-excision on the left 02/24/19 due to positive margin. Now nothing in the left breast. Bilateral implant placement sometime next year.  Other history includes anxiety and bilateral  knee pain.    Limitations  Lifting    Patient Stated Goals  get my ROM back    Currently in Pain?  Yes    Pain Score  4     Pain Location  Chest    Pain Orientation  Right;Left    Pain Descriptors / Indicators  Aching;Burning;Tightness    Pain Type  Surgical pain    Pain Onset  1 to 4 weeks ago    Pain Frequency  Constant    Aggravating Factors   It just hurts    Pain Relieving Factors  rest, hot and cold         [facility] PT Assessment - 03/10/19 0001      Assessment   Medical Diagnosis  bilateral mastectomy    Referring Provider (PT)  Dr. Iran Planas    Onset Date/Surgical Date  02/07/19    Hand Dominance  Right    Next MD Visit  tomorrow    Prior Therapy  no      Precautions   Precaution Comments  lymphedema bilateral      Restrictions   Weight Bearing Restrictions  No      Balance Screen   Has the patient fallen in the past 6 months  No    Has the patient had a decrease in activity level because of a fear of falling?   No    Is the patient reluctant to leave their home because of a fear of falling?   No      Home Social worker  Private residence    Living Arrangements  Alone    Available Help at Discharge  Family      Prior Function   Level of Independence  Independent    Vocation  Full time employment    Vocation Requirements  desk    Leisure  walking      Cognition   Overall Cognitive Status  Within Functional Limits for tasks assessed      Observation/Other Assessments   Observations  well healed/healing incisions bilateral mastectomy and newer incision on the Lt breast, Rt expander sits high up with most of pts discomfort here      Sensation   Additional Comments  reports numbness Rt lateral axilla towards shoulder blade (non cancer side)      Coordination   Gross Motor Movements are Fluid and Coordinated  Yes      Posture/Postural Control   Posture/Postural Control  Postural limitations    Postural Limitations  Rounded  Shoulders;Forward head   guarding from surgery     ROM / Strength   AROM / PROM / Strength  AROM      AROM   AROM Assessment Site  Shoulder    Right/Left Shoulder  Right;Left    Right Shoulder  Extension  45 Degrees    Right Shoulder Flexion  115 Degrees    Right Shoulder ABduction  120 Degrees    Right Shoulder Internal Rotation  75 Degrees    Right Shoulder External Rotation  80 Degrees    Right Shoulder Horizontal ABduction  20 Degrees    Left Shoulder Extension  45 Degrees    Left Shoulder Flexion  130 Degrees    Left Shoulder ABduction  150 Degrees    Left Shoulder Internal Rotation  75 Degrees    Left Shoulder External Rotation  80 Degrees    Left Shoulder Horizontal ABduction  20 Degrees      Palpation   Palpation comment  no sig edema present edges of Rt expander sit high and lateral         LYMPHEDEMA/ONCOLOGY QUESTIONNAIRE - 03/10/19 1206      Type   Cancer Type  bilateral breast cancer      Surgeries   Mastectomy Date  02/07/19    Sentinel Lymph Node Biopsy Date  02/07/19    Other Surgery Date  --   reexcision 02/24/19 on the left   Number Lymph Nodes Removed  --   2 left     Treatment   Active Chemotherapy Treatment  No    Past Chemotherapy Treatment  No    Active Radiation Treatment  No    Past Radiation Treatment  No    Current Hormone Treatment  No    Past Hormone Therapy  No      What other symptoms do you have   Are you Having Heaviness or Tightness  Yes    Are you having Pain  Yes      Lymphedema Assessments   Lymphedema Assessments  Upper extremities          Quick Dash - 03/10/19 0001    Open a tight or new jar  Mild difficulty    Do heavy household chores (wash walls, wash floors)  Moderate difficulty    Carry a shopping bag or briefcase  Mild difficulty    Wash your back  Mild difficulty    Use a knife to cut food  No difficulty    Recreational activities in which you take some force or impact through your arm, shoulder, or hand  (golf, hammering, tennis)  Moderate difficulty    During the past week, to what extent has your arm, shoulder or hand problem interfered with your normal social activities with family, friends, neighbors, or groups?  Modererately    During the past week, to what extent has your arm, shoulder or hand problem limited your work or other regular daily activities  Modererately    Arm, shoulder, or hand pain.  Mild    Tingling (pins and needles) in your arm, shoulder, or hand  Moderate    Difficulty Sleeping  Mild difficulty    DASH Score  34.09 %        Objective measurements completed on examination: See above findings.              PT Education - 03/10/19 1356    Education Details  POC, initial HEP    Person(s) Educated  Patient    Methods  Explanation;Demonstration;Tactile cues;Handout;Verbal cues    Comprehension  Verbalized understanding;Returned demonstration;Verbal cues required;Tactile cues required;Need further instruction          PT Long Term Goals - 03/10/19 2209      PT LONG TERM GOAL #1  Title  Pt will return to to bilateral shoulder AROM WNL to improve ADLs    Time  4    Period  Weeks    Status  New      PT LONG TERM GOAL #2   Title  Pt will be educated on lymphedema risk reduction and surveillance    Time  4    Period  Weeks      PT LONG TERM GOAL #3   Title  Pt will be educated on strength ABC    Time  4    Period  Weeks    Status  New             Plan - 03/10/19 2158    Clinical Impression Statement  Pt presents with bilateral mastectomy due to left breast cancer and past Rt breast cancer with re-excision and expander removal on the Lt due to negative margins.  Pt is doing well overall just with overall chest tightness, decrease shoulder ROM into flexion and abduction, and pain and tenderness at the superior Rt expander.  Pt also having some numbness on the Rt axillary region.    Examination-Activity Limitations  Dressing;Sleep;Carry;Reach  Overhead    Examination-Participation Restrictions  Yard Work;Cleaning;Laundry;Community Activity;Driving    Stability/Clinical Decision Making  Stable/Uncomplicated    Clinical Decision Making  Low    Rehab Potential  Excellent    PT Frequency  2x / week    PT Duration  4 weeks    PT Treatment/Interventions  ADLs/Self Care Home Management;Therapeutic exercise;Manual techniques;Taping;Scar mobilization;Patient/family education;Manual lymph drainage;Passive range of motion    PT Next Visit Plan  needs circumferential measurements from eval, bilateral shoulder PROM advance AAROM    PT Home Exercise Plan  scapular retractions, diaphragmatic breathing, supine cane flexion, supine or seated butterfly stretch on 03/10/19       Patient will benefit from skilled therapeutic intervention in order to improve the following deficits and impairments:  Decreased skin integrity, Impaired sensation, Decreased range of motion, Decreased scar mobility, Decreased activity tolerance, Impaired UE functional use, Pain, Postural dysfunction  Visit Diagnosis: S/P bilateral mastectomy  Abnormal posture  Stiffness of left shoulder, not elsewhere classified  Stiffness of right shoulder, not elsewhere classified     Problem List Patient Active Problem List   Diagnosis Date Noted  . S/P bilateral mastectomy 02/07/2019  . Pain in right ankle and joints of right foot 12/23/2017  . Chronic pain of right knee 12/23/2017  . Anxiety 03/27/2015  . Malignant neoplasm of upper-outer quadrant of left breast in female, estrogen receptor positive ([place]) 09/04/2014  . Fibrocystic breast changes 07/04/2011  . Atypical hyperplasia of breast excised R Breast may 2011 bc pills stopped 07/04/2011  . Personal history of colonic adenomas 11/17/2005    Stark Bray 03/10/2019, 10:15 PM  [place], [place], [address] Phone: [phone]   Fax:   [phone]  Name: Christine Francis MRN: [id number] Date of Birth: 05/02/1961

## 2019-03-16 ENCOUNTER — Other Ambulatory Visit: Payer: Self-pay

## 2019-03-16 ENCOUNTER — Ambulatory Visit: Payer: BC Managed Care – PPO | Attending: Plastic Surgery | Admitting: Rehabilitation

## 2019-03-16 ENCOUNTER — Encounter: Payer: Self-pay | Admitting: Rehabilitation

## 2019-03-16 DIAGNOSIS — M25522 Pain in left elbow: Secondary | ICD-10-CM | POA: Diagnosis not present

## 2019-03-16 DIAGNOSIS — M25611 Stiffness of right shoulder, not elsewhere classified: Secondary | ICD-10-CM | POA: Diagnosis not present

## 2019-03-16 DIAGNOSIS — M25612 Stiffness of left shoulder, not elsewhere classified: Secondary | ICD-10-CM | POA: Diagnosis not present

## 2019-03-16 DIAGNOSIS — R293 Abnormal posture: Secondary | ICD-10-CM

## 2019-03-16 DIAGNOSIS — Z9013 Acquired absence of bilateral breasts and nipples: Secondary | ICD-10-CM | POA: Diagnosis not present

## 2019-03-16 NOTE — Therapy (Signed)
[place], [place], [address] Phone: [phone]   Fax:  [phone]  Physical Therapy Treatment  Patient Details  Name: Christine Francis MRN: [id number] Date of Birth: 07-30-1960 Referring Provider (PT): Dr. Iran Planas   Encounter Date: 03/16/2019  PT End of Session - 03/16/19 1354    Visit Number  2    Number of Visits  9    Date for PT Re-Evaluation  04/07/19    PT Start Time  1300    PT Stop Time  1350    PT Time Calculation (min)  50 min    Activity Tolerance  Patient tolerated treatment well    Behavior During Therapy  [facility] for tasks assessed/performed       Past Medical History:  Diagnosis Date  . Allergy    allergy shots weekly  . Anxiety    no meds currently  . Asthma   . Cancer ([place])    skin- basil cancer face, chest and left leg  . GERD (gastroesophageal reflux disease)   . Hyperlipidemia    diet controlled, no meds  . Hypertension   . IBS (irritable bowel syndrome)    no problems currently  . Personal history of colonic adenomas 11/17/2005   11/17/2005 - 3 adenomas - one  A TV adenoma was 12 mm (max)  . PONV (postoperative nausea and vomiting)     Past Surgical History:  Procedure Laterality Date  . BIOPSY BREAST  2000   right; benign  . BREAST BIOPSY  2012   right breast; pre cancerous  . BREAST LUMPECTOMY Right 04/1999, 11/2009  . BREAST LUMPECTOMY WITH RADIOACTIVE SEED LOCALIZATION Right 11/29/2014   Procedure: BREAST LUMPECTOMY WITH RADIOACTIVE SEED LOCALIZATION;  Surgeon: Donnie Mesa, MD;  Location: [place];  Service: General;  Laterality: Right;  . BREAST RECONSTRUCTION WITH PLACEMENT OF TISSUE EXPANDER AND ALLODERM Bilateral 02/07/2019   Procedure: BILATERAL BREAST RECONSTRUCTION WITH PLACEMENT OF TISSUE EXPANDER AND ALLODERM;  Surgeon: [name], MD;  Location: [place];  Service: Plastics;  Laterality: Bilateral;  . BUNIONECTOMY  1986   bilateral  . cervical cryosurgery  12/2013  . COLONOSCOPY     x 2  . DIAGNOSTIC LAPAROSCOPY    . laproscopy  1998  . MASTECTOMY W/ SENTINEL NODE BIOPSY Bilateral 02/07/2019   Procedure: BILATERAL MASTECTOMIES WITH LEFT SENTINEL LYMPH NODE BIOPSY;  Surgeon: Donnie Mesa, MD;  Location: [place];  Service: General;  Laterality: Bilateral;  PEC BLOCK  . PARATHYROIDECTOMY  2005  . RE-EXCISION OF BREAST LUMPECTOMY Left 02/24/2019   Procedure: RE-EXCISION LEFT MASTECTOMY- LEFT DERMAL FLAP;  Surgeon: Donnie Mesa, MD;  Location: [place];  Service: General;  Laterality: Left;  . REMOVAL OF TISSUE EXPANDER AND PLACEMENT OF IMPLANT Left 02/24/2019   Procedure: REMOVAL OF LEFT CHEST TISSUE EXPANDER WITH ALLODERM;  Surgeon: [name], MD;  Location: [place];  Service: Plastics;  Laterality: Left;  . TISSUE EXPANDER FILLING Right 02/24/2019   Procedure: RIGHT CHEST TISSUE EXPANDER FILLING;  Surgeon: [name], MD;  Location: [place];  Service: Plastics;  Laterality: Right;  (466m 0.9% Normal Saline)  . uterine bx     thickening of uterus was the reason for havin bx  . WISDOM TOOTH EXTRACTION      There were no vitals filed for this visit.  Subjective Assessment - 03/16/19 1303    Subjective  I had some symptoms from arimidex early this week  but okay today.  My opiods are done now.    Pertinent History  Rt lumpectomy x 3 on the right last one 11/29/2014 LCIS with fibrocystic changes recurrence  on the left 12/08/18 ILC ER/PR positive, HER2 negative with bilateral mastectomy on 02/07/19 by Dr. Gershon Crane with immediate reconstruction by Dr. Iran Planas. Rt breast no carcinoma.  Lt breast with 2 lymph nodes negative. Re-excision on the left 02/24/19 due to positive margin. Now nothing in the left breast. Bilateral implant placement sometime next year.  Other history includes anxiety and bilateral knee pain.    Patient Stated Goals  get my ROM back     Currently in Pain?  Yes    Pain Score  3     Pain Location  Axilla    Pain Orientation  Right;Left    Pain Descriptors / Indicators  Aching;Burning;Tingling    Pain Type  Surgical pain    Pain Onset  1 to 4 weeks ago    Pain Frequency  Constant            LYMPHEDEMA/ONCOLOGY QUESTIONNAIRE - 03/16/19 1309      Right Upper Extremity Lymphedema   10 cm Proximal to Olecranon Process  34.5 cm    Olecranon Process  28 cm    10 cm Proximal to Ulnar Styloid Process  23.3 cm    Just Proximal to Ulnar Styloid Process  17.4 cm    Across Hand at [employer]  31.3 cm    At [place] of 2nd Digit  6.5 cm      Left Upper Extremity Lymphedema   10 cm Proximal to Olecranon Process  34.2 cm    Olecranon Process  27 cm    10 cm Proximal to Ulnar Styloid Process  24.5 cm    Just Proximal to Ulnar Styloid Process  6.9 cm    Across Hand at [employer]  11.5 cm    At [place] of 2nd Digit  6.6 cm                [facility] Adult PT Treatment/Exercise - 03/16/19 0001      Exercises   Exercises  Other Exercises    Other Exercises   pt asking about Lt elbow pain with upon examination is reproduced with resisted wrist extenstion and 2nd finger extension gave pt tennis elbow stretches, massage instruction, ice instruction, and how to watch for wrist positioning during the day x 57mn      Manual Therapy   Manual Therapy  Edema management;Passive ROM    Manual therapy comments  measured circumferences today and discussed compression, lymphedema, and risk reduction x 385m    Passive ROM  to bil shoulders             PT Education - 03/16/19 1354    Education Details  lymphedema and risk reduction    Person(s) Educated  Patient    Methods  Explanation;Handout    Comprehension  Verbalized understanding          PT Long Term Goals - 03/10/19 2209      PT LONG TERM GOAL #1   Title  Pt will return to to bilateral shoulder AROM WNL to improve ADLs    Time  4    Period  Weeks     Status  New      PT LONG TERM GOAL #2   Title  Pt will be educated on lymphedema risk reduction and surveillance  Time  4    Period  Weeks      PT LONG TERM GOAL #3   Title  Pt will be educated on strength ABC    Time  4    Period  Weeks    Status  New            Plan - 03/16/19 1356    Clinical Impression Statement  Pt continues with numbness and discomfort Rt axilla and implant space (non cancer side) but with increasing ROM bilaterally.  Today was spent assessing and educating pt on lymphedema and Lt lateral epicondylitis with exercise instruction and handouts regarding this.  PROM to bilateral shoulders today with some pain and puling on the Rt due to expander.    PT Frequency  2x / week    PT Duration  4 weeks    PT Treatment/Interventions  ADLs/Self Care Home Management;Therapeutic exercise;Manual techniques;Taping;Scar mobilization;Patient/family education;Manual lymph drainage;Passive range of motion;Iontophoresis 52m/ml Dexamethasone    PT Next Visit Plan  bilateral shoulder PROM advance AAROM, undress for PROM to try and decrease expander pain, watch Lt elbow PRN, ionto back?       Patient will benefit from skilled therapeutic intervention in order to improve the following deficits and impairments:     Visit Diagnosis: S/P bilateral mastectomy  Abnormal posture  Stiffness of left shoulder, not elsewhere classified  Stiffness of right shoulder, not elsewhere classified     Problem List Patient Active Problem List   Diagnosis Date Noted  . S/P bilateral mastectomy 02/07/2019  . Pain in right ankle and joints of right foot 12/23/2017  . Chronic pain of right knee 12/23/2017  . Anxiety 03/27/2015  . Malignant neoplasm of upper-outer quadrant of left breast in female, estrogen receptor positive ([place] 09/04/2014  . Fibrocystic breast changes 07/04/2011  . Atypical hyperplasia of breast excised R Breast may 2011 bc pills stopped 07/04/2011  . Personal history  of colonic adenomas 11/17/2005    TStark Bray9/08/2018, 2:10 PM  [place] [address]Phone: [phone]  Fax:  [phone] Name: Christine BrazeeMRN: [id number] of Birth: [birth year]

## 2019-03-16 NOTE — Patient Instructions (Signed)
Access Code: [email or web address]  URL: [email or web address]  Date: 03/16/2019  Prepared by: Shan Levans   Exercises  Standing Wrist Flexion Stretch - 10 reps - 1-3 sets - 20-30 seconds hold - 1x daily - 7x weekly  Seated Wrist Extension Stretch - 10 reps - 1-3 sets - 20-30 seconds hold - 1x daily - 7x weekly  Tennis Elbow Self Massage - 10 reps - 1-3 sets - 20-30 seconds hold - 1x daily - 7x weekly

## 2019-03-18 ENCOUNTER — Other Ambulatory Visit: Payer: Self-pay

## 2019-03-18 ENCOUNTER — Ambulatory Visit: Payer: BC Managed Care – PPO | Admitting: Rehabilitation

## 2019-03-18 ENCOUNTER — Encounter: Payer: Self-pay | Admitting: Rehabilitation

## 2019-03-18 DIAGNOSIS — R293 Abnormal posture: Secondary | ICD-10-CM

## 2019-03-18 DIAGNOSIS — M25612 Stiffness of left shoulder, not elsewhere classified: Secondary | ICD-10-CM

## 2019-03-18 DIAGNOSIS — Z9013 Acquired absence of bilateral breasts and nipples: Secondary | ICD-10-CM

## 2019-03-18 DIAGNOSIS — M25611 Stiffness of right shoulder, not elsewhere classified: Secondary | ICD-10-CM | POA: Diagnosis not present

## 2019-03-18 DIAGNOSIS — M25522 Pain in left elbow: Secondary | ICD-10-CM | POA: Diagnosis not present

## 2019-03-18 NOTE — Patient Instructions (Signed)
Access Code: [email or web address]  URL: [email or web address]  Date: 03/18/2019  Prepared by: Shan Levans   Exercises  Doorway Pec Stretch at 90 Degrees Abduction - 10 reps - 1-3 sets - 20-30 seconds hold - 1x daily - 7x weekly  Standing shoulder flexion wall slides - 10 reps - 3-5 second hold - 1x daily - 7x weekly  Standing Shoulder Abduction Slides at Wall - 10 reps - 3-5 second hold - 1x daily - 7x weekly  Seated Scapular Retraction - 10 reps - 1x daily - 7x weekly

## 2019-03-18 NOTE — Therapy (Signed)
[place], [place], [address] Phone: [phone]   Fax:  [phone]  Physical Therapy Treatment  Patient Details  Name: Christine Francis MRN: [id number] Date of Birth: 04-30-1961 Referring Provider (PT): Dr. Iran Planas   Encounter Date: 03/18/2019  PT End of Session - 03/18/19 0853    Visit Number  3    Number of Visits  9    Date for PT Re-Evaluation  04/07/19    PT Start Time  0800    PT Stop Time  0846    PT Time Calculation (min)  46 min    Activity Tolerance  Patient tolerated treatment well    Behavior During Therapy  [facility] for tasks assessed/performed       Past Medical History:  Diagnosis Date   Allergy    allergy shots weekly   Anxiety    no meds currently   Asthma    Cancer ([place])    skin- basil cancer face, chest and left leg   GERD (gastroesophageal reflux disease)    Hyperlipidemia    diet controlled, no meds   Hypertension    IBS (irritable bowel syndrome)    no problems currently   Personal history of colonic adenomas 11/17/2005   11/17/2005 - 3 adenomas - one  A TV adenoma was 12 mm (max)   PONV (postoperative nausea and vomiting)     Past Surgical History:  Procedure Laterality Date   BIOPSY BREAST  2000   right; benign   BREAST BIOPSY  2012   right breast; pre cancerous   BREAST LUMPECTOMY Right 04/1999, 11/2009   BREAST LUMPECTOMY WITH RADIOACTIVE SEED LOCALIZATION Right 11/29/2014   Procedure: BREAST LUMPECTOMY WITH RADIOACTIVE SEED LOCALIZATION;  Surgeon: Donnie Mesa, MD;  Location: [place];  Service: General;  Laterality: Right;   BREAST RECONSTRUCTION WITH PLACEMENT OF TISSUE EXPANDER AND ALLODERM Bilateral 02/07/2019   Procedure: BILATERAL BREAST RECONSTRUCTION WITH PLACEMENT OF TISSUE EXPANDER AND ALLODERM;  Surgeon: [name], MD;  Location: [place];  Service: Plastics;  Laterality: Bilateral;   BUNIONECTOMY  1986   bilateral   cervical cryosurgery  12/2013   COLONOSCOPY     x 2   DIAGNOSTIC LAPAROSCOPY     laproscopy  1998   MASTECTOMY W/ SENTINEL NODE BIOPSY Bilateral 02/07/2019   Procedure: BILATERAL MASTECTOMIES WITH LEFT SENTINEL LYMPH NODE BIOPSY;  Surgeon: Donnie Mesa, MD;  Location: [place];  Service: General;  Laterality: Bilateral;  [place] BLOCK   PARATHYROIDECTOMY  2005   RE-EXCISION OF BREAST LUMPECTOMY Left 02/24/2019   Procedure: RE-EXCISION LEFT MASTECTOMY- LEFT DERMAL FLAP;  Surgeon: Donnie Mesa, MD;  Location: [place];  Service: General;  Laterality: Left;   REMOVAL OF TISSUE EXPANDER AND PLACEMENT OF IMPLANT Left 02/24/2019   Procedure: REMOVAL OF LEFT CHEST TISSUE EXPANDER WITH ALLODERM;  Surgeon: [name], MD;  Location: [place];  Service: Plastics;  Laterality: Left;   TISSUE EXPANDER FILLING Right 02/24/2019   Procedure: RIGHT CHEST TISSUE EXPANDER FILLING;  Surgeon: [name], MD;  Location: [place];  Service: Plastics;  Laterality: Right;  (422m 0.9% Normal [place])   uterine bx     thickening of uterus was the reason for havin bx   WISDOM TOOTH EXTRACTION      There were no vitals filed for this visit.  Subjective Assessment - 03/18/19 0804    Subjective  My pain is just getting better  Pertinent History  Rt lumpectomy x 3 on the right last one 11/29/2014 LCIS with fibrocystic changes recurrence  on the left 12/08/18 ILC ER/PR positive, HER2 negative with bilateral mastectomy on 02/07/19 by Dr. Gershon Crane with immediate reconstruction by Dr. Iran Planas. Rt breast no carcinoma.  Lt breast with 2 lymph nodes negative. Re-excision on the left 02/24/19 due to positive margin. Now nothing in the left breast. Bilateral implant placement sometime next year.  Other history includes anxiety and bilateral knee pain.    Currently in Pain?  Yes    Pain Score  2     Pain Location  Axilla    Pain Orientation  Right     Pain Descriptors / Indicators  Aching    Pain Type  Surgical pain    Pain Onset  More than a month ago    Pain Frequency  Constant                       [facility] Adult PT Treatment/Exercise - 03/18/19 0001      Exercises   Exercises  Shoulder      Shoulder Exercises: Supine   Protraction  10 reps    Protraction Limitations  with ball ret/pro    Flexion  Both;12 reps    Flexion Limitations  with dowel rod      Shoulder Exercises: Pulleys   Flexion  2 minutes    Flexion Limitations  with instruction    ABduction  2 minutes    ABduction Limitations  with instruction      Shoulder Exercises: Therapy Ball   Flexion  10 reps      Shoulder Exercises: Stretch   Other Shoulder Stretches  doorway stretch 2x20"       Manual Therapy   Manual therapy comments  examined incisions today with the left side well healed and the Rt side with a bit more puffiness laterally and superiorly and with one small open spot under the lateral breast crease.  Pt reported some mild warmth yesterday but also that she did alot.  Photo taken and pt will monitor further.     Passive ROM  to bil shoulders                    PT Long Term Goals - 03/10/19 2209      PT LONG TERM GOAL #1   Title  Pt will return to to bilateral shoulder AROM WNL to improve ADLs    Time  4    Period  Weeks    Status  New      PT LONG TERM GOAL #2   Title  Pt will be educated on lymphedema risk reduction and surveillance    Time  4    Period  Weeks      PT LONG TERM GOAL #3   Title  Pt will be educated on strength ABC    Time  4    Period  Weeks    Status  New            Plan - 03/18/19 0827    Clinical Impression Statement  Pt much improved today and able to advance exercises.  less pain overall and improving AROM/less fear of movement.  Pt does have some increased puffiness at the lateral and superior Rt breast and we are still monitoring one open spot lateral breast fold.  No pull on  this area with arm elevation in supine though so  pt good to continue ROM work.    PT Frequency  2x / week    PT Duration  4 weeks    PT Treatment/Interventions  ADLs/Self Care Home Management;Therapeutic exercise;Manual techniques;Taping;Scar mobilization;Patient/family education;Manual lymph drainage;Passive range of motion;Iontophoresis 41m/ml Dexamethasone    PT Next Visit Plan  how is incision Rt breast, start 3 way active ROM or supine scap if all is going well, bilateral shoulder PROM watch Lt elbow PRN, ionto is signed if needed for elbow    PT Home Exercise Plan  Access Code: [email or web address]    Consulted and Agree with Plan of Care  Patient       Patient will benefit from skilled therapeutic intervention in order to improve the following deficits and impairments:     Visit Diagnosis: S/P bilateral mastectomy  Abnormal posture  Stiffness of left shoulder, not elsewhere classified  Stiffness of right shoulder, not elsewhere classified     Problem List Patient Active Problem List   Diagnosis Date Noted   S/P bilateral mastectomy 02/07/2019   Pain in right ankle and joints of right foot 12/23/2017   Chronic pain of right knee 12/23/2017   Anxiety 03/27/2015   Malignant neoplasm of upper-outer quadrant of left breast in female, estrogen receptor positive ([place] 09/04/2014   Fibrocystic breast changes 07/04/2011   Atypical hyperplasia of breast excised R Breast may 2011 bc pills stopped 07/04/2011   Personal history of colonic adenomas 11/17/2005    TStark Bray9/10/2018, 8:56 AM  [place] [address]Phone: [phone]  Fax:  [phone] Name: Christine StrongMRN: [id number] of Birth: [birth year]

## 2019-03-24 ENCOUNTER — Encounter: Payer: Self-pay | Admitting: Rehabilitation

## 2019-03-24 ENCOUNTER — Other Ambulatory Visit: Payer: Self-pay

## 2019-03-24 ENCOUNTER — Ambulatory Visit: Payer: BC Managed Care – PPO | Admitting: Rehabilitation

## 2019-03-24 DIAGNOSIS — Z9013 Acquired absence of bilateral breasts and nipples: Secondary | ICD-10-CM

## 2019-03-24 DIAGNOSIS — M25612 Stiffness of left shoulder, not elsewhere classified: Secondary | ICD-10-CM

## 2019-03-24 DIAGNOSIS — R293 Abnormal posture: Secondary | ICD-10-CM

## 2019-03-24 DIAGNOSIS — M25611 Stiffness of right shoulder, not elsewhere classified: Secondary | ICD-10-CM | POA: Diagnosis not present

## 2019-03-24 DIAGNOSIS — M25522 Pain in left elbow: Secondary | ICD-10-CM | POA: Diagnosis not present

## 2019-03-24 NOTE — Therapy (Signed)
[place], [place], [address] Phone: [phone]   Fax:  [phone]  Physical Therapy Treatment  Patient Details  Name: Christine Francis MRN: [id number] Date of Birth: 03/19/1961 Referring Provider (PT): Dr. Iran Planas   Encounter Date: 03/24/2019  PT End of Session - 03/24/19 1122    Visit Number  4    Number of Visits  9    Date for PT Re-Evaluation  04/07/19    Authorization Type  [employer] out of [place]    PT Start Time  1030    PT Stop Time  1116    PT Time Calculation (min)  46 min    Activity Tolerance  Patient tolerated treatment well    Behavior During Therapy  [facility] for tasks assessed/performed       Past Medical History:  Diagnosis Date  . Allergy    allergy shots weekly  . Anxiety    no meds currently  . Asthma   . Cancer ([place])    skin- basil cancer face, chest and left leg  . GERD (gastroesophageal reflux disease)   . Hyperlipidemia    diet controlled, no meds  . Hypertension   . IBS (irritable bowel syndrome)    no problems currently  . Personal history of colonic adenomas 11/17/2005   11/17/2005 - 3 adenomas - one  A TV adenoma was 12 mm (max)  . PONV (postoperative nausea and vomiting)     Past Surgical History:  Procedure Laterality Date  . BIOPSY BREAST  2000   right; benign  . BREAST BIOPSY  2012   right breast; pre cancerous  . BREAST LUMPECTOMY Right 04/1999, 11/2009  . BREAST LUMPECTOMY WITH RADIOACTIVE SEED LOCALIZATION Right 11/29/2014   Procedure: BREAST LUMPECTOMY WITH RADIOACTIVE SEED LOCALIZATION;  Surgeon: Donnie Mesa, MD;  Location: [place];  Service: General;  Laterality: Right;  . BREAST RECONSTRUCTION WITH PLACEMENT OF TISSUE EXPANDER AND ALLODERM Bilateral 02/07/2019   Procedure: BILATERAL BREAST RECONSTRUCTION WITH PLACEMENT OF TISSUE EXPANDER AND ALLODERM;  Surgeon: [name], MD;  Location: [place];  Service: Plastics;  Laterality:  Bilateral;  . BUNIONECTOMY  1986   bilateral  . cervical cryosurgery  12/2013  . COLONOSCOPY     x 2  . DIAGNOSTIC LAPAROSCOPY    . laproscopy  1998  . MASTECTOMY W/ SENTINEL NODE BIOPSY Bilateral 02/07/2019   Procedure: BILATERAL MASTECTOMIES WITH LEFT SENTINEL LYMPH NODE BIOPSY;  Surgeon: Donnie Mesa, MD;  Location: [place];  Service: General;  Laterality: Bilateral;  PEC BLOCK  . PARATHYROIDECTOMY  2005  . RE-EXCISION OF BREAST LUMPECTOMY Left 02/24/2019   Procedure: RE-EXCISION LEFT MASTECTOMY- LEFT DERMAL FLAP;  Surgeon: Donnie Mesa, MD;  Location: [place];  Service: General;  Laterality: Left;  . REMOVAL OF TISSUE EXPANDER AND PLACEMENT OF IMPLANT Left 02/24/2019   Procedure: REMOVAL OF LEFT CHEST TISSUE EXPANDER WITH ALLODERM;  Surgeon: [name], MD;  Location: [place];  Service: Plastics;  Laterality: Left;  . TISSUE EXPANDER FILLING Right 02/24/2019   Procedure: RIGHT CHEST TISSUE EXPANDER FILLING;  Surgeon: [name], MD;  Location: [place];  Service: Plastics;  Laterality: Right;  (414m 0.9% Normal [place])  . uterine bx     thickening of uterus was the reason for havin bx  . WISDOM TOOTH EXTRACTION      There were no vitals filed for this visit.  Subjective Assessment - 03/24/19 1029  Subjective  I just have bad allergies today.The right side feels better.  My pain is much less now.    Pertinent History  Rt lumpectomy x 3 on the right last one 11/29/2014 LCIS with fibrocystic changes recurrence  on the left 12/08/18 ILC ER/PR positive, HER2 negative with bilateral mastectomy on 02/07/19 by Dr. Gershon Crane with immediate reconstruction by Dr. Iran Planas. Rt breast no carcinoma.  Lt breast with 2 lymph nodes negative. Re-excision on the left 02/24/19 due to positive margin. Now nothing in the left breast. Bilateral implant placement sometime next year.  Other history includes anxiety and bilateral knee pain.    Patient  Stated Goals  get my ROM back    Currently in Pain?  Yes    Pain Score  2     Pain Location  Chest    Pain Orientation  Right    Pain Descriptors / Indicators  Aching;Tightness    Pain Type  Surgical pain    Pain Onset  More than a month ago    Pain Frequency  Constant         [facility] PT Assessment - 03/24/19 0001      AROM   Right Shoulder Flexion  155 Degrees    Right Shoulder ABduction  164 Degrees                   [facility] Adult PT Treatment/Exercise - 03/24/19 0001      Exercises   Other Exercises   updated HEP      Shoulder Exercises: Supine   Horizontal ABduction  Both;10 reps;Theraband    Theraband Level (Shoulder Horizontal ABduction)  Level 1 (Yellow)    External Rotation  Both;10 reps    Theraband Level (Shoulder External Rotation)  Level 1 (Yellow)    Diagonals  Both;10 reps    Theraband Level (Shoulder Diagonals)  Level 1 (Yellow)      Shoulder Exercises: Standing   Row  10 reps;Theraband    Theraband Level (Shoulder Row)  Level 1 (Yellow)    Other Standing Exercises  standing 3 way raise 1# x 10 each      Shoulder Exercises: Pulleys   Flexion  2 minutes    ABduction  2 minutes      Shoulder Exercises: Therapy Ball   Flexion  10 reps      Manual Therapy   Manual therapy comments  examined incisions today with the left side well healed and the Rt side now closed but still with the mild purple coloring.  Pt reports less pain                  PT Long Term Goals - 03/24/19 1124      PT LONG TERM GOAL #1   Title  Pt will return to to bilateral shoulder AROM WNL to improve ADLs    Status  Achieved      PT LONG TERM GOAL #2   Title  Pt will be educated on lymphedema risk reduction and surveillance    Status  Achieved      PT LONG TERM GOAL #3   Title  Pt will be educated on strength ABC    Status  Not Met            Plan - 03/24/19 1105    Clinical Impression Statement  Rt breast inciision looking better today and fully  closed.  Pt will note some blood intermittently in the bra but only a speck.  Advanced HEP today with AROM much improved since last measurement    PT Frequency  2x / week    PT Duration  4 weeks    PT Treatment/Interventions  ADLs/Self Care Home Management;Therapeutic exercise;Manual techniques;Taping;Scar mobilization;Patient/family education;Manual lymph drainage;Passive range of motion;Iontophoresis 77m/ml Dexamethasone    PT Next Visit Plan  continue bilateral shoulder ROM and strength; teach strength ABC for final 2 appointments, watch Lt elbow PRN, ionto is signed if needed for elbow    PT Home Exercise Plan  Access Code: [email or web address]       Patient will benefit from skilled therapeutic intervention in order to improve the following deficits and impairments:     Visit Diagnosis: S/P bilateral mastectomy  Abnormal posture  Stiffness of left shoulder, not elsewhere classified  Stiffness of right shoulder, not elsewhere classified     Problem List Patient Active Problem List   Diagnosis Date Noted  . S/P bilateral mastectomy 02/07/2019  . Pain in right ankle and joints of right foot 12/23/2017  . Chronic pain of right knee 12/23/2017  . Anxiety 03/27/2015  . Malignant neoplasm of upper-outer quadrant of left breast in female, estrogen receptor positive ([place] 09/04/2014  . Fibrocystic breast changes 07/04/2011  . Atypical hyperplasia of breast excised R Breast may 2011 bc pills stopped 07/04/2011  . Personal history of colonic adenomas 11/17/2005    TStark Bray9/04/2019, 11:25 AM  [place] [address]Phone: [phone]  Fax:  [phone] Name: Christine RadaMRN: [id number] of Birth: 6Aug 02, 1962

## 2019-03-24 NOTE — Patient Instructions (Signed)
Access Code: [email or web address]  URL: [email or web address]  Date: 03/24/2019  Prepared by: Shan Levans   Exercises  Doorway Pec Stretch at 90 Degrees Abduction - 10 reps - 1-3 sets - 20-30 seconds hold - 1x daily - 7x weekly  Standing shoulder flexion wall slides - 10 reps - 3-5 second hold - 1x daily - 7x weekly  Standing Shoulder Abduction Slides at Wall - 10 reps - 3-5 second hold - 1x daily - 7x weekly  Standing Row with Anchored Resistance - 10 reps - 1 sets - 1x daily - 7x weekly  Supine Shoulder Horizontal Abduction with Resistance - 10 reps - 1 sets - 1x daily - 7x weekly  Seated Shoulder Diagonal with Resistance - 10 reps - 1 sets - 1x daily - 7x weekly  Supine Bilateral Shoulder External Rotation with Resistance - 10 reps - 1 sets - 1x daily - 7x weekly

## 2019-03-31 ENCOUNTER — Other Ambulatory Visit: Payer: Self-pay

## 2019-03-31 ENCOUNTER — Ambulatory Visit: Payer: BC Managed Care – PPO

## 2019-03-31 DIAGNOSIS — R293 Abnormal posture: Secondary | ICD-10-CM

## 2019-03-31 DIAGNOSIS — M25612 Stiffness of left shoulder, not elsewhere classified: Secondary | ICD-10-CM | POA: Diagnosis not present

## 2019-03-31 DIAGNOSIS — M25611 Stiffness of right shoulder, not elsewhere classified: Secondary | ICD-10-CM

## 2019-03-31 DIAGNOSIS — Z9013 Acquired absence of bilateral breasts and nipples: Secondary | ICD-10-CM

## 2019-03-31 DIAGNOSIS — M25522 Pain in left elbow: Secondary | ICD-10-CM | POA: Diagnosis not present

## 2019-03-31 NOTE — Therapy (Signed)
[place], [place], [address] Phone: [phone]   Fax:  [phone]  Physical Therapy Treatment  Patient Details  Name: Christine Francis MRN: [id number] Date of Birth: [birth year] Referring Provider (PT): Dr. Iran Planas   Encounter Date: 03/31/2019  PT End of Session - 03/31/19 1341    Visit Number  5    Number of Visits  9    Date for PT Re-Evaluation  04/07/19    Authorization Type  BCBS out of state    Authorization - Number of Visits  60    PT Start Time  1035    PT Stop Time  1130    PT Time Calculation (min)  55 min    Activity Tolerance  Patient tolerated treatment well    Behavior During Therapy  [facility] for tasks assessed/performed       Past Medical History:  Diagnosis Date  . Allergy    allergy shots weekly  . Anxiety    no meds currently  . Asthma   . Cancer ([place])    skin- basil cancer face, chest and left leg  . GERD (gastroesophageal reflux disease)   . Hyperlipidemia    diet controlled, no meds  . Hypertension   . IBS (irritable bowel syndrome)    no problems currently  . Personal history of colonic adenomas 11/17/2005   11/17/2005 - 3 adenomas - one  A TV adenoma was 12 mm (max)  . PONV (postoperative nausea and vomiting)     Past Surgical History:  Procedure Laterality Date  . BIOPSY BREAST  2000   right; benign  . BREAST BIOPSY  2012   right breast; pre cancerous  . BREAST LUMPECTOMY Right 04/1999, 11/2009  . BREAST LUMPECTOMY WITH RADIOACTIVE SEED LOCALIZATION Right 11/29/2014   Procedure: BREAST LUMPECTOMY WITH RADIOACTIVE SEED LOCALIZATION;  Surgeon: Donnie Mesa, MD;  Location: [place];  Service: General;  Laterality: Right;  . BREAST RECONSTRUCTION WITH PLACEMENT OF TISSUE EXPANDER AND ALLODERM Bilateral 02/07/2019   Procedure: BILATERAL BREAST RECONSTRUCTION WITH PLACEMENT OF TISSUE EXPANDER AND ALLODERM;  Surgeon: [name], MD;  Location:  [place];  Service: Plastics;  Laterality: Bilateral;  . BUNIONECTOMY  1986   bilateral  . cervical cryosurgery  12/2013  . COLONOSCOPY     x 2  . DIAGNOSTIC LAPAROSCOPY    . laproscopy  1998  . MASTECTOMY W/ SENTINEL NODE BIOPSY Bilateral 02/07/2019   Procedure: BILATERAL MASTECTOMIES WITH LEFT SENTINEL LYMPH NODE BIOPSY;  Surgeon: Donnie Mesa, MD;  Location: [place];  Service: General;  Laterality: Bilateral;  PEC BLOCK  . PARATHYROIDECTOMY  2005  . RE-EXCISION OF BREAST LUMPECTOMY Left 02/24/2019   Procedure: RE-EXCISION LEFT MASTECTOMY- LEFT DERMAL FLAP;  Surgeon: Donnie Mesa, MD;  Location: [place];  Service: General;  Laterality: Left;  . REMOVAL OF TISSUE EXPANDER AND PLACEMENT OF IMPLANT Left 02/24/2019   Procedure: REMOVAL OF LEFT CHEST TISSUE EXPANDER WITH ALLODERM;  Surgeon: [name], MD;  Location: [place];  Service: Plastics;  Laterality: Left;  . TISSUE EXPANDER FILLING Right 02/24/2019   Procedure: RIGHT CHEST TISSUE EXPANDER FILLING;  Surgeon: [name], MD;  Location: [place];  Service: Plastics;  Laterality: Right;  (484m 0.9% Normal Saline)  . uterine bx     thickening of uterus was the reason for havin bx  . WISDOM TOOTH EXTRACTION      There were no vitals filed for this  visit.  Subjective Assessment - 03/31/19 1148    Subjective  Pt reports that she is feeling better. She states that she is still experiencing pain in her L elbow that has gotten a little worse. She no longer is noticing any blood in her bra by her incision.    Pertinent History  Rt lumpectomy x 3 on the right last one 11/29/2014 LCIS with fibrocystic changes recurrence  on the left 12/08/18 ILC ER/PR positive, HER2 negative with bilateral mastectomy on 02/07/19 by Dr. Gershon Crane with immediate reconstruction by Dr. Iran Planas. Rt breast no carcinoma.  Lt breast with 2 lymph nodes negative. Re-excision on the left 02/24/19 due to positive margin.  Now nothing in the left breast. Bilateral implant placement sometime next year.  Other history includes anxiety and bilateral knee pain.    Limitations  Lifting    Patient Stated Goals  get my ROM back    Currently in Pain?  Yes    Pain Score  2    Elbow pain 4/10   Pain Onset  More than a month ago                       [facility] Adult PT Treatment/Exercise - 03/31/19 0001      Exercises   Exercises  Knee/Hip    Other Exercises   extensor carpi radialis and radialis brevis isometric and eccentric contractions fon the L for L elbow pain      Knee/Hip Exercises: Stretches   Sports administrator  Both;2 reps;20 seconds    Quad Stretch Limitations  modified using the table due to pt was unable to reach her ankle to perform. Requires UE support to maintain balance.     Gastroc Stretch  2 reps;Both;10 seconds    Gastroc Stretch Limitations  Pt required demonsration with good return demonstration.       Shoulder Exercises: Supine   Horizontal ABduction  Both;10 reps;Theraband    Theraband Level (Shoulder Horizontal ABduction)  Level 1 (Yellow)    External Rotation  Both;10 reps    Theraband Level (Shoulder External Rotation)  Level 1 (Yellow)      Shoulder Exercises: Pulleys   Flexion  2 minutes    Flexion Limitations  Pt instructed on maintaining upright posture w/o trunk flexion    ABduction  2 minutes    ABduction Limitations  Pt educated to maintain upright posture w/o trunk flexion      Shoulder Exercises: Therapy Ball   Flexion  10 reps    Flexion Limitations  5 s hold w/very light over pressure decreased importance of not stressing incision    ABduction  10 reps    ABduction Limitations  5 s hold w/very light stretching at end range. emphasized importance of not stressing incision      Shoulder Exercises: Stretch   Cross Chest Stretch  3 reps    Cross Chest Stretch Limitations  used wall 10 sec hold RUE only tactlie cueing for adequate stretch     Wall Stretch -  ABduction  3 reps    Wall Stretch - ABduction Limitations  pt held 10 seconds R side only; tactile cueing for adequate stretch     Other Shoulder Stretches  Over head tricep stretch 2x 20 seconds       Iontophoresis   Type of Iontophoresis  Dexamethasone    Location  L elbow    Dose  1 mL     Time  6 hours  PT Education - 03/31/19 1340    Education Details  Pt educated to perform exercises that we practiced in the clinic at home. Discussed the importance of not stressing her incisions and to watch out for any signs including stress or open areas.    Person(s) Educated  Patient    Methods  Explanation;Demonstration;Handout    Comprehension  Verbalized understanding;Returned demonstration          PT Long Term Goals - 03/24/19 1124      PT LONG TERM GOAL #1   Title  Pt will return to to bilateral shoulder AROM WNL to improve ADLs    Status  Achieved      PT LONG TERM GOAL #2   Title  Pt will be educated on lymphedema risk reduction and surveillance    Status  Achieved      PT LONG TERM GOAL #3   Title  Pt will be educated on strength ABC    Status  Not Met            Plan - 03/31/19 1341    Clinical Impression Statement  Pt presents with functional AROM this session that is only slightly decreased compared to her L side. No significant increase in pain with any activity. Pt tolerated initial exercises of strength after breast cancer activities well and stated understanding of education to protect incision site. Pt continues w/pain and tenderness over the L lateral epicondyle at the insertion of hte ECRB and ECRL. Pt was taught eccentric resistance of the 3rd digit into extension to decrease inflammation at the musculotendinous junction and iontophoresis was applied. Pt was educated on risk see pt instruction. Continue with current POC    Examination-Activity Limitations  Dressing;Sleep;Carry;Reach Overhead    Examination-Participation Restrictions  Yard  Work;Cleaning;Laundry;Community Activity;Driving    Stability/Clinical Decision Making  Stable/Uncomplicated    Rehab Potential  Excellent    PT Frequency  2x / week    PT Duration  4 weeks    PT Treatment/Interventions  ADLs/Self Care Home Management;Therapeutic exercise;Manual techniques;Taping;Scar mobilization;Patient/family education;Manual lymph drainage;Passive range of motion;Iontophoresis 29m/ml Dexamethasone    PT Next Visit Plan  Assess ionto, continue with ABC strengthening after breast cancer exercises, do re-assessment/update goals or discharge depending on what is appropriate.    PT Home Exercise Plan  Access Code: [email or web address]    Consulted and Agree with Plan of Care  Patient       Patient will benefit from skilled therapeutic intervention in order to improve the following deficits and impairments:  Decreased skin integrity, Impaired sensation, Decreased range of motion, Decreased scar mobility, Decreased activity tolerance, Impaired UE functional use, Pain, Postural dysfunction  Visit Diagnosis: S/P bilateral mastectomy  Abnormal posture  Stiffness of left shoulder, not elsewhere classified  Stiffness of right shoulder, not elsewhere classified     Problem List Patient Active Problem List   Diagnosis Date Noted  . S/P bilateral mastectomy 02/07/2019  . Pain in right ankle and joints of right foot 12/23/2017  . Chronic pain of right knee 12/23/2017  . Anxiety 03/27/2015  . Malignant neoplasm of upper-outer quadrant of left breast in female, estrogen receptor positive [place] 09/04/2014  . Fibrocystic breast changes 07/04/2011  . Atypical hyperplasia of breast excised R Breast may 2011 bc pills stopped 07/04/2011  . Personal history of colonic adenomas 11/17/2005    CAnder Purpura9/17/2020, 2:03 PM  [place] [address]Phone: [phone]  Fax:   [phone]  Name: Christine Francis MRN: [id number] Date of Birth: June 18, 1961

## 2019-03-31 NOTE — Patient Instructions (Addendum)
Pt was provided with ABC strengthening sheet.  Performed page 1-5 including:  Cross chest stretch Tricep stretch  Pec stretch  Gastroc stretch Modified quad stretch.   IONTOPHORESIS PATIENT PRECAUTIONS & CONTRAINDICATIONS:  . Redness under one or both electrodes can occur.  This characterized by a uniform redness that usually disappears within 12 hours of treatment. . Small pinhead size blisters may result in response to the drug.  Contact your physician if the problem persists more than 24 hours. . On rare occasions, iontophoresis therapy can result in temporary skin reactions such as rash, inflammation, irritation or burns.  The skin reactions may be the result of individual sensitivity to the ionic solution used, the condition of the skin at the start of treatment, reaction to the materials in the electrodes, allergies or sensitivity to dexamethasone, or a poor connection between the patch and your skin.  Discontinue using iontophoresis if you have any of these reactions and report to your therapist. . Remove the Patch or electrodes if you have any undue sensation of pain or burning during the treatment and report discomfort to your therapist. . Tell your Therapist if you have had known adverse reactions to the application of electrical current. . If using the Patch, the LED light will turn off when treatment is complete and the patch can be removed.  Approximate treatment time is 1-3 hours.  Remove the patch when light goes off or after 6 hours. . The Patch can be worn during normal activity, however excessive motion where the electrodes have been placed can cause poor contact between the skin and the electrode or uneven electrical current resulting in greater risk of skin irritation. Marland Kitchen Keep out of the reach of children.   . DO NOT use if you have a cardiac pacemaker or any other electrically sensitive implanted device. . DO NOT use if you have a known sensitivity to dexamethasone. . DO  NOT use during Magnetic Resonance Imaging (MRI). . DO NOT use over broken or compromised skin (e.g. sunburn, cuts, or acne) due to the increased risk of skin reaction. . DO NOT SHAVE over the area to be treated:  To establish good contact between the Patch and the skin, excessive hair may be clipped. . DO NOT place the Patch or electrodes on or over your eyes, directly over your heart, or brain. . DO NOT reuse the Patch or electrodes as this may cause burns to occur.

## 2019-03-31 NOTE — Therapy (Signed)
[place], [place], [address] Phone: [phone]   Fax:  [phone]  Physical Therapy Treatment  Patient Details  Name: Christine Francis MRN: [id number] Date of Birth: 05/26/1961 Referring Provider (PT): Dr. Iran Planas   Encounter Date: 03/31/2019  PT End of Session - 03/31/19 1341    Visit Number  5    Number of Visits  9    Date for PT Re-Evaluation  04/07/19    Authorization Type  BCBS out of state    Authorization - Number of Visits  60    PT Start Time  1035    PT Stop Time  1130    PT Time Calculation (min)  55 min    Activity Tolerance  Patient tolerated treatment well    Behavior During Therapy  [facility] for tasks assessed/performed       Past Medical History:  Diagnosis Date  . Allergy    allergy shots weekly  . Anxiety    no meds currently  . Asthma   . Cancer ([place])    skin- basil cancer face, chest and left leg  . GERD (gastroesophageal reflux disease)   . Hyperlipidemia    diet controlled, no meds  . Hypertension   . IBS (irritable bowel syndrome)    no problems currently  . Personal history of colonic adenomas 11/17/2005   11/17/2005 - 3 adenomas - one  A TV adenoma was 12 mm (max)  . PONV (postoperative nausea and vomiting)     Past Surgical History:  Procedure Laterality Date  . BIOPSY BREAST  2000   right; benign  . BREAST BIOPSY  2012   right breast; pre cancerous  . BREAST LUMPECTOMY Right 04/1999, 11/2009  . BREAST LUMPECTOMY WITH RADIOACTIVE SEED LOCALIZATION Right 11/29/2014   Procedure: BREAST LUMPECTOMY WITH RADIOACTIVE SEED LOCALIZATION;  Surgeon: Donnie Mesa, MD;  Location: [place];  Service: General;  Laterality: Right;  . BREAST RECONSTRUCTION WITH PLACEMENT OF TISSUE EXPANDER AND ALLODERM Bilateral 02/07/2019   Procedure: BILATERAL BREAST RECONSTRUCTION WITH PLACEMENT OF TISSUE EXPANDER AND ALLODERM;  Surgeon: [name], MD;  Location:  [place];  Service: Plastics;  Laterality: Bilateral;  . BUNIONECTOMY  1986   bilateral  . cervical cryosurgery  12/2013  . COLONOSCOPY     x 2  . DIAGNOSTIC LAPAROSCOPY    . laproscopy  1998  . MASTECTOMY W/ SENTINEL NODE BIOPSY Bilateral 02/07/2019   Procedure: BILATERAL MASTECTOMIES WITH LEFT SENTINEL LYMPH NODE BIOPSY;  Surgeon: Donnie Mesa, MD;  Location: [place];  Service: General;  Laterality: Bilateral;  PEC BLOCK  . PARATHYROIDECTOMY  2005  . RE-EXCISION OF BREAST LUMPECTOMY Left 02/24/2019   Procedure: RE-EXCISION LEFT MASTECTOMY- LEFT DERMAL FLAP;  Surgeon: Donnie Mesa, MD;  Location: [place];  Service: General;  Laterality: Left;  . REMOVAL OF TISSUE EXPANDER AND PLACEMENT OF IMPLANT Left 02/24/2019   Procedure: REMOVAL OF LEFT CHEST TISSUE EXPANDER WITH ALLODERM;  Surgeon: [name], MD;  Location: [place];  Service: Plastics;  Laterality: Left;  . TISSUE EXPANDER FILLING Right 02/24/2019   Procedure: RIGHT CHEST TISSUE EXPANDER FILLING;  Surgeon: [name], MD;  Location: [place];  Service: Plastics;  Laterality: Right;  (475m 0.9% Normal [place])  . uterine bx     thickening of uterus was the reason for havin bx  . WISDOM TOOTH EXTRACTION      There were no vitals filed for this  visit.  Subjective Assessment - 03/31/19 1148    Subjective  Pt reports that she is feeling better. She states that she is still experiencing pain in her L elbow that has gotten a little worse. She no longer is noticing any blood in her bra by her incision.    Pertinent History  Rt lumpectomy x 3 on the right last one 11/29/2014 LCIS with fibrocystic changes recurrence  on the left 12/08/18 IL[facility] ER/PR positive, HER2 negative with bilateral mastectomy on 02/07/19 by Dr. Gershon Crane with immediate reconstruction by Dr. Iran Planas. Rt breast no carcinoma.  Lt breast with 2 lymph nodes negative. Re-excision on the left 02/24/19 due to positive margin.  Now nothing in the left breast. Bilateral implant placement sometime next year.  Other history includes anxiety and bilateral knee pain.    Limitations  Lifting    Patient Stated Goals  get my ROM back    Currently in Pain?  Yes    Pain Score  2    Elbow pain 4/10   Pain Onset  More than a month ago                       [facility] Adult PT Treatment/Exercise - 03/31/19 0001      Exercises   Exercises  Knee/Hip    Other Exercises   extensor carpi radialis and radialis brevis isometric and eccentric contractions fon the L for L elbow pain      Knee/Hip Exercises: Stretches   Sports administrator  Both;2 reps;20 seconds    Quad Stretch Limitations  modified using the table due to pt was unable to reach her ankle to perform. Requires UE support to maintain balance.     Gastroc Stretch  2 reps;Both;10 seconds    Gastroc Stretch Limitations  Pt required demonsration with good return demonstration.       Shoulder Exercises: Supine   Horizontal ABduction  Both;10 reps;Theraband    Theraband Level (Shoulder Horizontal ABduction)  Level 1 (Yellow)    External Rotation  Both;10 reps    Theraband Level (Shoulder External Rotation)  Level 1 (Yellow)      Shoulder Exercises: Pulleys   Flexion  2 minutes    Flexion Limitations  Pt instructed on maintaining upright posture w/o trunk flexion    ABduction  2 minutes    ABduction Limitations  Pt educated to maintain upright posture w/o trunk flexion      Shoulder Exercises: Therapy Ball   Flexion  10 reps    Flexion Limitations  5 s hold w/very light over pressure decreased importance of not stressing incision    ABduction  10 reps    ABduction Limitations  5 s hold w/very light stretching at end range. emphasized importance of not stressing incision      Shoulder Exercises: Stretch   Cross Chest Stretch  3 reps    Cross Chest Stretch Limitations  used wall 10 sec hold RUE only tactlie cueing for adequate stretch     Wall Stretch -  ABduction  3 reps    Wall Stretch - ABduction Limitations  pt held 10 seconds R side only; tactile cueing for adequate stretch     Other Shoulder Stretches  Over head tricep stretch 2x 20 seconds       Iontophoresis   Type of Iontophoresis  Dexamethasone    Location  L elbow    Dose  1 mL     Time  6 hours  PT Education - 03/31/19 1340    Education Details  Pt educated to perform exercises that we practiced in the clinic at home. Discussed the importance of not stressing her incisions and to watch out for any signs including stress or open areas.    Person(s) Educated  Patient    Methods  Explanation;Demonstration;Handout    Comprehension  Verbalized understanding;Returned demonstration          PT Long Term Goals - 03/24/19 1124      PT LONG TERM GOAL #1   Title  Pt will return to to bilateral shoulder AROM WNL to improve ADLs    Status  Achieved      PT LONG TERM GOAL #2   Title  Pt will be educated on lymphedema risk reduction and surveillance    Status  Achieved      PT LONG TERM GOAL #3   Title  Pt will be educated on strength ABC    Status  Not Met            Plan - 03/31/19 1341    Clinical Impression Statement  Pt presents with functional AROM this session that is only slightly decreased compared to her L side. No significant increase in pain with any activity. Pt tolerated initial exercises of strength after breast cancer activities well and stated understanding of education to protect incision site. Pt continues w/pain and tenderness over the L lateral epicondyle at the insertion of the ECRB and ECRL. Pt was taught eccentric resistance of the 3rd digit into extension to decrease inflammation at the musculotendinous junction and iontophoresis was applied. Pt was educated on risks, care and benefits of iontophoresis see pt instruction. Continue with current POC    Examination-Activity Limitations  Dressing;Sleep;Carry;Reach Overhead     Examination-Participation Restrictions  Yard Work;Cleaning;Laundry;Community Activity;Driving    Stability/Clinical Decision Making  Stable/Uncomplicated    Rehab Potential  Excellent    PT Frequency  2x / week    PT Duration  4 weeks    PT Treatment/Interventions  ADLs/Self Care Home Management;Therapeutic exercise;Manual techniques;Taping;Scar mobilization;Patient/family education;Manual lymph drainage;Passive range of motion;Iontophoresis 60m/ml Dexamethasone    PT Next Visit Plan  Assess ionto, continue with ABC strengthening after breast cancer exercises, do re-assessment/update goals or discharge depending on what is appropriate.    PT Home Exercise Plan  Access Code: [email or web address]    Consulted and Agree with Plan of Care  Patient       Patient will benefit from skilled therapeutic intervention in order to improve the following deficits and impairments:  Decreased skin integrity, Impaired sensation, Decreased range of motion, Decreased scar mobility, Decreased activity tolerance, Impaired UE functional use, Pain, Postural dysfunction  Visit Diagnosis: S/P bilateral mastectomy  Abnormal posture  Stiffness of left shoulder, not elsewhere classified  Stiffness of right shoulder, not elsewhere classified     Problem List Patient Active Problem List   Diagnosis Date Noted  . S/P bilateral mastectomy 02/07/2019  . Pain in right ankle and joints of right foot 12/23/2017  . Chronic pain of right knee 12/23/2017  . Anxiety 03/27/2015  . Malignant neoplasm of upper-outer quadrant of left breast in [place], estrogen receptor positive [place] 09/04/2014  . Fibrocystic breast changes 07/04/2011  . Atypical hyperplasia of breast excised R Breast may 2011 bc pills stopped 07/04/2011  . Personal history of colonic adenomas 11/17/2005    CAnder Purpura PT 03/31/2019, 1:52 PM  [facility]  [place], [place],  [address] Phone: [phone]   Fax:  [phone]  Name: Christine Francis MRN: [id number] Date of Birth: 1960/08/12

## 2019-04-01 ENCOUNTER — Encounter: Payer: BC Managed Care – PPO | Admitting: Rehabilitation

## 2019-04-11 ENCOUNTER — Ambulatory Visit: Payer: BC Managed Care – PPO | Admitting: Rehabilitation

## 2019-04-11 ENCOUNTER — Other Ambulatory Visit: Payer: Self-pay

## 2019-04-11 ENCOUNTER — Encounter: Payer: Self-pay | Admitting: Rehabilitation

## 2019-04-11 DIAGNOSIS — Z9013 Acquired absence of bilateral breasts and nipples: Secondary | ICD-10-CM

## 2019-04-11 DIAGNOSIS — M25522 Pain in left elbow: Secondary | ICD-10-CM

## 2019-04-11 DIAGNOSIS — M25611 Stiffness of right shoulder, not elsewhere classified: Secondary | ICD-10-CM

## 2019-04-11 DIAGNOSIS — M25612 Stiffness of left shoulder, not elsewhere classified: Secondary | ICD-10-CM

## 2019-04-11 DIAGNOSIS — R293 Abnormal posture: Secondary | ICD-10-CM

## 2019-04-11 NOTE — Therapy (Signed)
[place], [place], [address] Phone: [phone]   Fax:  [phone]  Physical Therapy Treatment  Patient Details  Name: Christine Francis MRN: [id number] Date of Birth: [birth year] Referring Provider (PT): Dr. Iran Planas   Encounter Date: 04/11/2019  PT End of Session - 04/11/19 1706    Visit Number  6    Number of Visits  9    Date for PT Re-Evaluation  05/16/19    Authorization Type  [employer] out of state    PT Start Time  1405    PT Stop Time  1455    PT Time Calculation (min)  50 min    Activity Tolerance  Patient tolerated treatment well    Behavior During Therapy  [facility] for tasks assessed/performed       Past Medical History:  Diagnosis Date  . Allergy    allergy shots weekly  . Anxiety    no meds currently  . Asthma   . Cancer ([place])    skin- basil cancer face, chest and left leg  . GERD (gastroesophageal reflux disease)   . Hyperlipidemia    diet controlled, no meds  . Hypertension   . IBS (irritable bowel syndrome)    no problems currently  . Personal history of colonic adenomas 11/17/2005   11/17/2005 - 3 adenomas - one  A TV adenoma was 12 mm (max)  . PONV (postoperative nausea and vomiting)     Past Surgical History:  Procedure Laterality Date  . BIOPSY BREAST  2000   right; benign  . BREAST BIOPSY  2012   right breast; pre cancerous  . BREAST LUMPECTOMY Right 04/1999, 11/2009  . BREAST LUMPECTOMY WITH RADIOACTIVE SEED LOCALIZATION Right 11/29/2014   Procedure: BREAST LUMPECTOMY WITH RADIOACTIVE SEED LOCALIZATION;  Surgeon: Donnie Mesa, MD;  Location: [place];  Service: General;  Laterality: Right;  . BREAST RECONSTRUCTION WITH PLACEMENT OF TISSUE EXPANDER AND ALLODERM Bilateral 02/07/2019   Procedure: BILATERAL BREAST RECONSTRUCTION WITH PLACEMENT OF TISSUE EXPANDER AND ALLODERM;  Surgeon: [name], MD;  Location: [place];  Service: Plastics;  Laterality:  Bilateral;  . BUNIONECTOMY  1986   bilateral  . cervical cryosurgery  12/2013  . COLONOSCOPY     x 2  . DIAGNOSTIC LAPAROSCOPY    . laproscopy  1998  . MASTECTOMY W/ SENTINEL NODE BIOPSY Bilateral 02/07/2019   Procedure: BILATERAL MASTECTOMIES WITH LEFT SENTINEL LYMPH NODE BIOPSY;  Surgeon: Donnie Mesa, MD;  Location: [place];  Service: General;  Laterality: Bilateral;  PEC BLOCK  . PARATHYROIDECTOMY  2005  . RE-EXCISION OF BREAST LUMPECTOMY Left 02/24/2019   Procedure: RE-EXCISION LEFT MASTECTOMY- LEFT DERMAL FLAP;  Surgeon: Donnie Mesa, MD;  Location: [place];  Service: General;  Laterality: Left;  . REMOVAL OF TISSUE EXPANDER AND PLACEMENT OF IMPLANT Left 02/24/2019   Procedure: REMOVAL OF LEFT CHEST TISSUE EXPANDER WITH ALLODERM;  Surgeon: [name], MD;  Location: [place];  Service: Plastics;  Laterality: Left;  . TISSUE EXPANDER FILLING Right 02/24/2019   Procedure: RIGHT CHEST TISSUE EXPANDER FILLING;  Surgeon: [name], MD;  Location: [place];  Service: Plastics;  Laterality: Right;  (471m 0.9% Normal [place])  . uterine bx     thickening of uterus was the reason for havin bx  . WISDOM TOOTH EXTRACTION      There were no vitals filed for this visit.  Subjective Assessment - 04/11/19 1409  Subjective  I see Dr. Iran Planas on Wednesday.  I just wish I more stamina. I started the arimidex 3 weeks ago, my joint pain and hot flashes are back.  I'm back to work full time now. It is still very tight under the breast.    Pertinent History  Rt lumpectomy x 3 on the right last one 11/29/2014 LCIS with fibrocystic changes recurrence  on the left 12/08/18 ILC ER/PR positive, HER2 negative with bilateral mastectomy on 02/07/19 by Dr. Gershon Crane with immediate reconstruction by Dr. Iran Planas. Rt breast no carcinoma.  Lt breast with 2 lymph nodes negative. Re-excision on the left 02/24/19 due to positive margin. Now nothing in the left  breast. Bilateral implant placement sometime next year.  Other history includes anxiety and bilateral knee pain.    Patient Stated Goals  get my ROM back    Currently in Pain?  Yes    Pain Score  2     Pain Location  Breast    Pain Orientation  Right;Left    Pain Descriptors / Indicators  Tightness    Pain Type  Surgical pain    Pain Onset  More than a month ago    Pain Frequency  Constant                       [facility] Adult PT Treatment/Exercise - 04/11/19 0001      Exercises   Other Exercises   completed strength ABC today with performance of all weight exercsies x 10 with 2#, did not perofrm quadruped but did discuss this progression.        Iontophoresis   Type of Iontophoresis  Dexamethasone    Location  L elbow    Dose  1 mL     Time  6 hours                  PT Long Term Goals - 04/11/19 1710      PT LONG TERM GOAL #1   Title  Pt will return to to bilateral shoulder AROM WNL to improve ADLs            Plan - 04/11/19 1707    Clinical Impression Statement  Completed strength ABC instruction which patient enjoyed doing and said it reminded her of being back at the gym.  Did not have to modify any exercises. Extending POC today x 5 weeks at 1x per week to work more on the left elbow pain and to continue to monitor the chest tightness and shoulder ROM as needed.    Examination-Activity Limitations  Lift    Examination-Participation Restrictions  Yard Work    Stability/Clinical Decision Making  Stable/Uncomplicated    Clinical Decision Making  Low    Rehab Potential  Excellent    PT Frequency  1x / week    PT Duration  4 weeks    PT Treatment/Interventions  ADLs/Self Care Home Management;Therapeutic exercise;Manual techniques;Taping;Scar mobilization;Patient/family education;Manual lymph drainage;Passive range of motion;Iontophoresis 80m/ml Dexamethasone;Moist Heat;Cryotherapy;Electrical Stimulation    PT Next Visit Plan  focus more on left  elbow pain to include STM, ionto #3, exercises, ice massage, taping, etc.    PT Home Exercise Plan  Access Code: [email or web address], strength ABC    Consulted and Agree with Plan of Care  Patient       Patient will benefit from skilled therapeutic intervention in order to improve the following deficits and impairments:  Decreased skin integrity, Impaired sensation, Decreased range  of motion, Decreased scar mobility, Decreased activity tolerance, Impaired UE functional use, Pain, Postural dysfunction  Visit Diagnosis: S/P bilateral mastectomy  Abnormal posture  Stiffness of left shoulder, not elsewhere classified  Stiffness of right shoulder, not elsewhere classified  Pain in left elbow     Problem List Patient Active Problem List   Diagnosis Date Noted  . S/P bilateral mastectomy 02/07/2019  . Pain in right ankle and joints of right foot 12/23/2017  . Chronic pain of right knee 12/23/2017  . Anxiety 03/27/2015  . Malignant neoplasm of upper-outer quadrant of left breast in female, estrogen receptor positive ([address]) 09/04/2014  . Fibrocystic breast changes 07/04/2011  . Atypical hyperplasia of breast excised R Breast may 2011 bc pills stopped 07/04/2011  . Personal history of colonic adenomas 11/17/2005    Stark Bray 04/11/2019, 5:12 PM  [place], [place], [address] Phone: [phone]   Fax:  [phone]  Name: Liboria Putnam MRN: [id number] Date of Birth: 04-Oct-1960

## 2019-04-21 ENCOUNTER — Other Ambulatory Visit: Payer: Self-pay

## 2019-04-21 ENCOUNTER — Ambulatory Visit: Payer: BC Managed Care – PPO | Attending: Plastic Surgery | Admitting: Rehabilitation

## 2019-04-21 ENCOUNTER — Encounter: Payer: Self-pay | Admitting: Rehabilitation

## 2019-04-21 DIAGNOSIS — M25522 Pain in left elbow: Secondary | ICD-10-CM

## 2019-04-21 DIAGNOSIS — Z9013 Acquired absence of bilateral breasts and nipples: Secondary | ICD-10-CM | POA: Insufficient documentation

## 2019-04-21 DIAGNOSIS — M25611 Stiffness of right shoulder, not elsewhere classified: Secondary | ICD-10-CM | POA: Diagnosis not present

## 2019-04-21 DIAGNOSIS — M25612 Stiffness of left shoulder, not elsewhere classified: Secondary | ICD-10-CM | POA: Diagnosis not present

## 2019-04-21 DIAGNOSIS — R293 Abnormal posture: Secondary | ICD-10-CM | POA: Diagnosis not present

## 2019-04-21 NOTE — Therapy (Signed)
[place], [place], [address] Phone: [phone]   Fax:  [phone]  Physical Therapy Treatment  Patient Details  Name: Christine Francis MRN: [id number] Date of Birth: 03-27-1961 Referring Provider (PT): Dr. Iran Planas   Encounter Date: 04/21/2019  PT End of Session - 04/21/19 1057    Visit Number  7    Number of Visits  11    Date for PT Re-Evaluation  05/16/19    PT Start Time  1006    PT Stop Time  1046    PT Time Calculation (min)  40 min    Activity Tolerance  Patient tolerated treatment well    Behavior During Therapy  [facility] for tasks assessed/performed       Past Medical History:  Diagnosis Date  . Allergy    allergy shots weekly  . Anxiety    no meds currently  . Asthma   . Cancer ([place])    skin- basil cancer face, chest and left leg  . GERD (gastroesophageal reflux disease)   . Hyperlipidemia    diet controlled, no meds  . Hypertension   . IBS (irritable bowel syndrome)    no problems currently  . Personal history of colonic adenomas 11/17/2005   11/17/2005 - 3 adenomas - one  A TV adenoma was 12 mm (max)  . PONV (postoperative nausea and vomiting)     Past Surgical History:  Procedure Laterality Date  . BIOPSY BREAST  2000   right; benign  . BREAST BIOPSY  2012   right breast; pre cancerous  . BREAST LUMPECTOMY Right 04/1999, 11/2009  . BREAST LUMPECTOMY WITH RADIOACTIVE SEED LOCALIZATION Right 11/29/2014   Procedure: BREAST LUMPECTOMY WITH RADIOACTIVE SEED LOCALIZATION;  Surgeon: Donnie Mesa, MD;  Location: [place];  Service: General;  Laterality: Right;  . BREAST RECONSTRUCTION WITH PLACEMENT OF TISSUE EXPANDER AND ALLODERM Bilateral 02/07/2019   Procedure: BILATERAL BREAST RECONSTRUCTION WITH PLACEMENT OF TISSUE EXPANDER AND ALLODERM;  Surgeon: [name], MD;  Location: [place];  Service: Plastics;  Laterality: Bilateral;  . BUNIONECTOMY  1986   bilateral  . cervical cryosurgery  12/2013  . COLONOSCOPY     x 2  . DIAGNOSTIC LAPAROSCOPY    . laproscopy  1998  . MASTECTOMY W/ SENTINEL NODE BIOPSY Bilateral 02/07/2019   Procedure: BILATERAL MASTECTOMIES WITH LEFT SENTINEL LYMPH NODE BIOPSY;  Surgeon: Donnie Mesa, MD;  Location: [place];  Service: General;  Laterality: Bilateral;  PEC BLOCK  . PARATHYROIDECTOMY  2005  . RE-EXCISION OF BREAST LUMPECTOMY Left 02/24/2019   Procedure: RE-EXCISION LEFT MASTECTOMY- LEFT DERMAL FLAP;  Surgeon: Donnie Mesa, MD;  Location: [place];  Service: General;  Laterality: Left;  . REMOVAL OF TISSUE EXPANDER AND PLACEMENT OF IMPLANT Left 02/24/2019   Procedure: REMOVAL OF LEFT CHEST TISSUE EXPANDER WITH ALLODERM;  Surgeon: [name], MD;  Location: [place];  Service: Plastics;  Laterality: Left;  . TISSUE EXPANDER FILLING Right 02/24/2019   Procedure: RIGHT CHEST TISSUE EXPANDER FILLING;  Surgeon: [name], MD;  Location: [place];  Service: Plastics;  Laterality: Right;  (450m 0.9% Normal [place])  . uterine bx     thickening of uterus was the reason for havin bx  . WISDOM TOOTH EXTRACTION      There were no vitals filed for this visit.  Subjective Assessment - 04/21/19 1053    Subjective  My Rt expander surgery will occur in November.  My elbow did not hurt at all until yesterday after the patch last time.    Pertinent History  Rt lumpectomy x 3 on the right last one 11/29/2014 LCIS with fibrocystic changes recurrence  on the left 12/08/18 ILC ER/PR positive, HER2 negative with bilateral mastectomy on 02/07/19 by Dr. Gershon Crane with immediate reconstruction by Dr. Iran Planas. Rt breast no carcinoma.  Lt breast with 2 lymph nodes negative. Re-excision on the left 02/24/19 due to positive margin. Now nothing in the left breast. Bilateral implant placement sometime next year.  Other history includes anxiety and bilateral knee pain.    Currently in  Pain?  No/denies                       [facility] Adult PT Treatment/Exercise - 04/21/19 0001      Modalities   Modalities  Cryotherapy      Cryotherapy   Number Minutes Cryotherapy  3 Minutes    Cryotherapy Location  Forearm    Type of Cryotherapy  Ice massage      Iontophoresis   Type of Iontophoresis  Dexamethasone    Location  L elbow    Dose  1 mL     Time  6 hours      Manual Therapy   Manual Therapy  Soft tissue mobilization    Soft tissue mobilization  to the left forearm extensor mass up into the lateral elbow at radius insertion. minimal tenderness today    Passive ROM  stretches into wrist flexion and extension                  PT Long Term Goals - 04/11/19 1710      PT LONG TERM GOAL #1   Title  Pt will return to to bilateral shoulder AROM WNL to improve ADLs            Plan - 04/21/19 1058    Clinical Impression Statement  Resent to MD to extend POC to include more elbow work.  STM and pain relief work to the left elbow with only mild tenderness at the head of the radius.  Added ice massage with some skin redness but pt reports she has sensitive skin and this always happens.    PT Frequency  1x / week    PT Duration  4 weeks    PT Treatment/Interventions  ADLs/Self Care Home Management;Therapeutic exercise;Manual techniques;Taping;Scar mobilization;Patient/family education;Manual lymph drainage;Passive range of motion;Iontophoresis 25m/ml Dexamethasone;Moist Heat;Cryotherapy;Electrical Stimulation    PT Next Visit Plan  cont with left elbow pain to include STM, ionto #4/6, exercises, ice massage, taping, etc.    PT Home Exercise Plan  Access Code: [email or web address], strength ABC    Consulted and Agree with Plan of Care  Patient       Patient will benefit from skilled therapeutic intervention in order to improve the following deficits and impairments:  Decreased skin integrity, Impaired sensation, Decreased range of motion, Decreased  scar mobility, Decreased activity tolerance, Impaired UE functional use, Pain, Postural dysfunction  Visit Diagnosis: S/P bilateral mastectomy  Abnormal posture  Stiffness of left shoulder, not elsewhere classified  Pain in left elbow  Stiffness of right shoulder, not elsewhere classified     Problem List Patient Active Problem List   Diagnosis Date Noted  . S/P bilateral mastectomy 02/07/2019  . Pain in right ankle and joints of right foot 12/23/2017  . Chronic pain of right knee 12/23/2017  . Anxiety 03/27/2015  .  Malignant neoplasm of upper-outer quadrant of left breast in female, estrogen receptor positive ([place]) 09/04/2014  . Fibrocystic breast changes 07/04/2011  . Atypical hyperplasia of breast excised R Breast may 2011 bc pills stopped 07/04/2011  . Personal history of colonic adenomas 11/17/2005    Stark Bray 04/21/2019, 1:55 PM  [place], [place], [address] Phone: [phone]   Fax:  [phone]  Name: Christine Francis MRN: [id number] Date of Birth: 1961/03/21

## 2019-04-28 ENCOUNTER — Ambulatory Visit: Payer: BC Managed Care – PPO | Admitting: Rehabilitation

## 2019-04-28 ENCOUNTER — Encounter: Payer: Self-pay | Admitting: Rehabilitation

## 2019-04-28 ENCOUNTER — Other Ambulatory Visit: Payer: Self-pay

## 2019-04-28 DIAGNOSIS — Z9013 Acquired absence of bilateral breasts and nipples: Secondary | ICD-10-CM

## 2019-04-28 DIAGNOSIS — R293 Abnormal posture: Secondary | ICD-10-CM

## 2019-04-28 DIAGNOSIS — M25611 Stiffness of right shoulder, not elsewhere classified: Secondary | ICD-10-CM

## 2019-04-28 DIAGNOSIS — M25522 Pain in left elbow: Secondary | ICD-10-CM | POA: Diagnosis not present

## 2019-04-28 DIAGNOSIS — M25612 Stiffness of left shoulder, not elsewhere classified: Secondary | ICD-10-CM

## 2019-04-28 NOTE — Therapy (Signed)
[place], [place], [address] Phone: [phone]   Fax:  [phone]  Physical Therapy Treatment  Patient Details  Name: Christine Francis MRN: [id number] Date of Birth: June 22, 1961 Referring Provider (PT): Dr. Iran Planas   Encounter Date: 04/28/2019  PT End of Session - 04/28/19 1053    Visit Number  8    Number of Visits  11    Date for PT Re-Evaluation  06/20/19    PT Start Time  1000    PT Stop Time  1050    PT Time Calculation (min)  50 min    Activity Tolerance  Patient tolerated treatment well    Behavior During Therapy  [facility] for tasks assessed/performed       Past Medical History:  Diagnosis Date  . Allergy    allergy shots weekly  . Anxiety    no meds currently  . Asthma   . Cancer ([place])    skin- basil cancer face, chest and left leg  . GERD (gastroesophageal reflux disease)   . Hyperlipidemia    diet controlled, no meds  . Hypertension   . IBS (irritable bowel syndrome)    no problems currently  . Personal history of colonic adenomas 11/17/2005   11/17/2005 - 3 adenomas - one  A TV adenoma was 12 mm (max)  . PONV (postoperative nausea and vomiting)     Past Surgical History:  Procedure Laterality Date  . BIOPSY BREAST  2000   right; benign  . BREAST BIOPSY  2012   right breast; pre cancerous  . BREAST LUMPECTOMY Right 04/1999, 11/2009  . BREAST LUMPECTOMY WITH RADIOACTIVE SEED LOCALIZATION Right 11/29/2014   Procedure: BREAST LUMPECTOMY WITH RADIOACTIVE SEED LOCALIZATION;  Surgeon: Donnie Mesa, MD;  Location: [place];  Service: General;  Laterality: Right;  . BREAST RECONSTRUCTION WITH PLACEMENT OF TISSUE EXPANDER AND ALLODERM Bilateral 02/07/2019   Procedure: BILATERAL BREAST RECONSTRUCTION WITH PLACEMENT OF TISSUE EXPANDER AND ALLODERM;  Surgeon: [name], MD;  Location: [place];  Service: Plastics;  Laterality: Bilateral;  . BUNIONECTOMY  1986   bilateral  . cervical cryosurgery  12/2013  . COLONOSCOPY     x 2  . DIAGNOSTIC LAPAROSCOPY    . laproscopy  1998  . MASTECTOMY W/ SENTINEL NODE BIOPSY Bilateral 02/07/2019   Procedure: BILATERAL MASTECTOMIES WITH LEFT SENTINEL LYMPH NODE BIOPSY;  Surgeon: Donnie Mesa, MD;  Location: [place];  Service: General;  Laterality: Bilateral;  PEC BLOCK  . PARATHYROIDECTOMY  2005  . RE-EXCISION OF BREAST LUMPECTOMY Left 02/24/2019   Procedure: RE-EXCISION LEFT MASTECTOMY- LEFT DERMAL FLAP;  Surgeon: Donnie Mesa, MD;  Location: [place];  Service: General;  Laterality: Left;  . REMOVAL OF TISSUE EXPANDER AND PLACEMENT OF IMPLANT Left 02/24/2019   Procedure: REMOVAL OF LEFT CHEST TISSUE EXPANDER WITH ALLODERM;  Surgeon: [name], MD;  Location: [place];  Service: Plastics;  Laterality: Left;  . TISSUE EXPANDER FILLING Right 02/24/2019   Procedure: RIGHT CHEST TISSUE EXPANDER FILLING;  Surgeon: [name], MD;  Location: [place];  Service: Plastics;  Laterality: Right;  (442m 0.9% Normal [place])  . uterine bx     thickening of uterus was the reason for havin bx  . WISDOM TOOTH EXTRACTION      There were no vitals filed for this visit.  Subjective Assessment - 04/28/19 1002    Subjective  My arm is going great but I slept on  it wrong last night and now it really hurts.    Pertinent History  Rt lumpectomy x 3 on the right last one 11/29/2014 LCIS with fibrocystic changes recurrence  on the left 12/08/18 ILC ER/PR positive, HER2 negative with bilateral mastectomy on 02/07/19 by Dr. Gershon Crane with immediate reconstruction by Dr. Iran Planas. Rt breast no carcinoma.  Lt breast with 2 lymph nodes negative. Re-excision on the left 02/24/19 due to positive margin. Now nothing in the left breast. Bilateral implant placement sometime next year.  Other history includes anxiety and bilateral knee pain.                       [facility] Adult PT  Treatment/Exercise - 04/28/19 0001      Exercises   Other Exercises   seated eccentric wrist extension 1# x 10, radial /ulnar deviation 13 x 10, and pronation/supination 1#  x 10 added to HEP      Iontophoresis   Type of Iontophoresis  Dexamethasone    Location  L elbow    Dose  1 mL     Time  6 hours      Manual Therapy   Manual Therapy  Soft tissue mobilization;Joint mobilization    Joint Mobilization  radial head PA glides grade IV- x 60"    Soft tissue mobilization  to the left forearm extensor mass up into the lateral elbow at radius insertion. minimal tenderness today    Passive ROM  stretches into wrist flexion and extension                  PT Long Term Goals - 04/28/19 1055      PT LONG TERM GOAL #4   Title  Pt will decrease left elbow pain to none  in the morning    Status  Partially Met            Plan - 04/28/19 1053    Clinical Impression Statement  Pt with no elbow pain all week until sleeping wrong last night.  Continues with mild tenderness radial head and reproduction of pain with active supination.  No more pain with resisted wrist or 3rd finger today.    PT Frequency  1x / week    PT Duration  4 weeks    PT Treatment/Interventions  ADLs/Self Care Home Management;Therapeutic exercise;Manual techniques;Taping;Scar mobilization;Patient/family education;Manual lymph drainage;Passive range of motion;Iontophoresis 65m/ml Dexamethasone;Moist Heat;Cryotherapy;Electrical Stimulation    PT Next Visit Plan  cont with left elbow pain treatment to include STM, ionto #5/6, exercises, ice massage, taping, etc.    Consulted and Agree with Plan of Care  Patient       Patient will benefit from skilled therapeutic intervention in order to improve the following deficits and impairments:     Visit Diagnosis: S/P bilateral mastectomy  Abnormal posture  Stiffness of left shoulder, not elsewhere classified  Pain in left elbow  Stiffness of right shoulder,  not elsewhere classified     Problem List Patient Active Problem List   Diagnosis Date Noted  . S/P bilateral mastectomy 02/07/2019  . Pain in right ankle and joints of right foot 12/23/2017  . Chronic pain of right knee 12/23/2017  . Anxiety 03/27/2015  . Malignant neoplasm of upper-outer quadrant of left breast in female, estrogen receptor positive ([place] 09/04/2014  . Fibrocystic breast changes 07/04/2011  . Atypical hyperplasia of breast excised R Breast may 2011 bc pills stopped 07/04/2011  . Personal history of colonic adenomas 11/17/2005  Stark Bray 04/28/2019, 10:56 AM  [place], [place], [address] Phone: [phone]   Fax:  [phone]  Name: Christine Francis MRN: [id number] Date of Birth: [birth year]

## 2019-04-28 NOTE — Patient Instructions (Signed)
Access Code: [email or web address]  URL: [email or web address]  Date: 04/28/2019  Prepared by: Shan Levans   Exercises  Seated Eccentric Wrist Extension - 10 reps - 1-3 sets - 1x daily - 7x weekly  Seated Wrist Radial Deviation with Dumbbell - 10 reps - 1-3 sets - 1x daily - 7x weekly  Forearm Pronation and Supination with Hammer - 10 reps - 1-3 sets - 1x daily - 7x weekly  Standing Wrist Extension Stretch - 10 reps - 1-3 sets - 20-30 seconds hold - 1x daily - 7x weekly  Standing Wrist Flexion Stretch - 10 reps - 1-3 sets - 20-30 seconds hold - 1x daily - 7x weekly

## 2019-05-02 DIAGNOSIS — L570 Actinic keratosis: Secondary | ICD-10-CM | POA: Diagnosis not present

## 2019-05-02 DIAGNOSIS — L814 Other melanin hyperpigmentation: Secondary | ICD-10-CM | POA: Diagnosis not present

## 2019-05-02 DIAGNOSIS — D1801 Hemangioma of skin and subcutaneous tissue: Secondary | ICD-10-CM | POA: Diagnosis not present

## 2019-05-02 DIAGNOSIS — D485 Neoplasm of uncertain behavior of skin: Secondary | ICD-10-CM | POA: Diagnosis not present

## 2019-05-02 DIAGNOSIS — L820 Inflamed seborrheic keratosis: Secondary | ICD-10-CM | POA: Diagnosis not present

## 2019-05-02 DIAGNOSIS — L718 Other rosacea: Secondary | ICD-10-CM | POA: Diagnosis not present

## 2019-05-02 DIAGNOSIS — D229 Melanocytic nevi, unspecified: Secondary | ICD-10-CM | POA: Diagnosis not present

## 2019-05-05 ENCOUNTER — Ambulatory Visit: Payer: BC Managed Care – PPO | Admitting: Rehabilitation

## 2019-05-05 ENCOUNTER — Other Ambulatory Visit: Payer: Self-pay

## 2019-05-05 ENCOUNTER — Encounter: Payer: Self-pay | Admitting: Rehabilitation

## 2019-05-05 DIAGNOSIS — Z9013 Acquired absence of bilateral breasts and nipples: Secondary | ICD-10-CM | POA: Diagnosis not present

## 2019-05-05 DIAGNOSIS — R293 Abnormal posture: Secondary | ICD-10-CM | POA: Diagnosis not present

## 2019-05-05 DIAGNOSIS — M25611 Stiffness of right shoulder, not elsewhere classified: Secondary | ICD-10-CM | POA: Diagnosis not present

## 2019-05-05 DIAGNOSIS — M25612 Stiffness of left shoulder, not elsewhere classified: Secondary | ICD-10-CM | POA: Diagnosis not present

## 2019-05-05 DIAGNOSIS — M25522 Pain in left elbow: Secondary | ICD-10-CM

## 2019-05-05 NOTE — Therapy (Signed)
[place], [place], [address] Phone: [phone]   Fax:  [phone]  Physical Therapy Treatment  Patient Details  Name: Christine Francis MRN: [id number] Date of Birth: September 06, 1960 Referring Provider (PT): Dr. Iran Planas   Encounter Date: 05/05/2019  PT End of Session - 05/05/19 1234    Visit Number  9    Number of Visits  11    Date for PT Re-Evaluation  06/20/19    PT Start Time  1008    PT Stop Time  1050    PT Time Calculation (min)  42 min    Activity Tolerance  Patient tolerated treatment well    Behavior During Therapy  [facility] for tasks assessed/performed       Past Medical History:  Diagnosis Date  . Allergy    allergy shots weekly  . Anxiety    no meds currently  . Asthma   . Cancer ([place])    skin- basil cancer face, chest and left leg  . GERD (gastroesophageal reflux disease)   . Hyperlipidemia    diet controlled, no meds  . Hypertension   . IBS (irritable bowel syndrome)    no problems currently  . Personal history of colonic adenomas 11/17/2005   11/17/2005 - 3 adenomas - one  A TV adenoma was 12 mm (max)  . PONV (postoperative nausea and vomiting)     Past Surgical History:  Procedure Laterality Date  . BIOPSY BREAST  2000   right; benign  . BREAST BIOPSY  2012   right breast; pre cancerous  . BREAST LUMPECTOMY Right 04/1999, 11/2009  . BREAST LUMPECTOMY WITH RADIOACTIVE SEED LOCALIZATION Right 11/29/2014   Procedure: BREAST LUMPECTOMY WITH RADIOACTIVE SEED LOCALIZATION;  Surgeon: Donnie Mesa, MD;  Location: [place];  Service: General;  Laterality: Right;  . BREAST RECONSTRUCTION WITH PLACEMENT OF TISSUE EXPANDER AND ALLODERM Bilateral 02/07/2019   Procedure: BILATERAL BREAST RECONSTRUCTION WITH PLACEMENT OF TISSUE EXPANDER AND ALLODERM;  Surgeon: [name], MD;  Location: [place];  Service: Plastics;  Laterality: Bilateral;  . BUNIONECTOMY  1986   bilateral  . cervical cryosurgery  12/2013  . COLONOSCOPY     x 2  . DIAGNOSTIC LAPAROSCOPY    . laproscopy  1998  . MASTECTOMY W/ SENTINEL NODE BIOPSY Bilateral 02/07/2019   Procedure: BILATERAL MASTECTOMIES WITH LEFT SENTINEL LYMPH NODE BIOPSY;  Surgeon: Donnie Mesa, MD;  Location: [place];  Service: General;  Laterality: Bilateral;  PEC BLOCK  . PARATHYROIDECTOMY  2005  . RE-EXCISION OF BREAST LUMPECTOMY Left 02/24/2019   Procedure: RE-EXCISION LEFT MASTECTOMY- LEFT DERMAL FLAP;  Surgeon: Donnie Mesa, MD;  Location: [place];  Service: General;  Laterality: Left;  . REMOVAL OF TISSUE EXPANDER AND PLACEMENT OF IMPLANT Left 02/24/2019   Procedure: REMOVAL OF LEFT CHEST TISSUE EXPANDER WITH ALLODERM;  Surgeon: [name], MD;  Location: [place];  Service: Plastics;  Laterality: Left;  . TISSUE EXPANDER FILLING Right 02/24/2019   Procedure: RIGHT CHEST TISSUE EXPANDER FILLING;  Surgeon: [name], MD;  Location: [place];  Service: Plastics;  Laterality: Right;  (463m 0.9% Normal Saline)  . uterine bx     thickening of uterus was the reason for havin bx  . WISDOM TOOTH EXTRACTION      There were no vitals filed for this visit.  Subjective Assessment - 05/05/19 1111    Subjective  My arm is doing great. No pain since last  time.  I didnt even get a sleeve    Pertinent History  Rt lumpectomy x 3 on the right last one 11/29/2014 LCIS with fibrocystic changes recurrence  on the left 12/08/18 ILC ER/PR positive, HER2 negative with bilateral mastectomy on 02/07/19 by Dr. Gershon Crane with immediate reconstruction by Dr. Iran Planas. Rt breast no carcinoma.  Lt breast with 2 lymph nodes negative. Re-excision on the left 02/24/19 due to positive margin. Now nothing in the left breast. Bilateral implant placement sometime next year.  Other history includes anxiety and bilateral knee pain.    Patient Stated Goals  get my ROM back    Currently in  Pain?  No/denies                       [facility] Adult PT Treatment/Exercise - 05/05/19 0001      Exercises   Other Exercises   reviewed final HEP and desk ergonomics for home      Iontophoresis   Type of Iontophoresis  Dexamethasone    Location  L elbow    Dose  1 mL     Time  6 hours      Manual Therapy   Manual Therapy  Soft tissue mobilization;Joint mobilization    Soft tissue mobilization  to the left forearm extensor mass up into the lateral elbow at radius insertion. minimal tenderness today    Passive ROM  stretches into wrist flexion and extension                  PT Long Term Goals - 05/05/19 1236      PT LONG TERM GOAL #1   Title  Pt will return to to bilateral shoulder AROM WNL to improve ADLs    Status  Achieved      PT LONG TERM GOAL #2   Title  Pt will be educated on lymphedema risk reduction and surveillance    Status  Achieved      PT LONG TERM GOAL #3   Title  Pt will be educated on strength ABC    Status  Achieved      PT LONG TERM GOAL #4   Title  Pt will decrease left elbow pain to none  in the morning    Status  Achieved            Plan - 05/05/19 1234    Clinical Impression Statement  Pt returns with no elbow pain, no tenderness, and no pain with resisted wrist motions.  performed final treatment session today and final ionto patch.  Will DC for now.    Consulted and Agree with Plan of Care  Patient       Patient will benefit from skilled therapeutic intervention in order to improve the following deficits and impairments:  Decreased skin integrity, Impaired sensation, Decreased range of motion, Decreased scar mobility, Decreased activity tolerance, Impaired UE functional use, Pain, Postural dysfunction  Visit Diagnosis: S/P bilateral mastectomy  Abnormal posture  Stiffness of left shoulder, not elsewhere classified  Pain in left elbow  Stiffness of right shoulder, not elsewhere classified     Problem  List Patient Active Problem List   Diagnosis Date Noted  . S/P bilateral mastectomy 02/07/2019  . Pain in right ankle and joints of right foot 12/23/2017  . Chronic pain of right knee 12/23/2017  . Anxiety 03/27/2015  . Malignant neoplasm of upper-outer quadrant of left breast in female, estrogen receptor positive ([place]) 09/04/2014  .  Fibrocystic breast changes 07/04/2011  . Atypical hyperplasia of breast excised R Breast may 2011 bc pills stopped 07/04/2011  . Personal history of colonic adenomas 11/17/2005    Stark Bray 05/05/2019, 12:37 PM  [place], [place], [address] Phone: [phone]   Fax:  [phone]  Name: Christine Francis MRN: [id number] Date of Birth: 08-22-1960  PHYSICAL THERAPY DISCHARGE SUMMARY  Visits from Start of Care: 9  Current functional level related to goals / functional outcomes: All goals met   Remaining deficits: Need for continued strengthening     Education / Equipment: HEP for elbow and shoulders Plan: Patient agrees to discharge.  Patient goals were met. Patient is being discharged due to meeting the stated rehab goals.  ?????    Shan Levans, PT

## 2019-05-12 ENCOUNTER — Encounter: Payer: BC Managed Care – PPO | Admitting: Rehabilitation

## 2019-05-16 ENCOUNTER — Encounter (HOSPITAL_BASED_OUTPATIENT_CLINIC_OR_DEPARTMENT_OTHER): Payer: Self-pay

## 2019-05-16 ENCOUNTER — Other Ambulatory Visit: Payer: Self-pay

## 2019-05-20 ENCOUNTER — Encounter (HOSPITAL_BASED_OUTPATIENT_CLINIC_OR_DEPARTMENT_OTHER)
Admission: RE | Admit: 2019-05-20 | Discharge: 2019-05-20 | Disposition: A | Discharge status: Home / Self Care | Payer: BC Managed Care – PPO | Source: Ambulatory Visit | Attending: Plastic Surgery | Admitting: Plastic Surgery

## 2019-05-20 ENCOUNTER — Other Ambulatory Visit (HOSPITAL_COMMUNITY)
Admission: RE | Admit: 2019-05-20 | Discharge: 2019-05-20 | Disposition: A | Discharge status: Home / Self Care | Payer: BC Managed Care – PPO | Source: Ambulatory Visit | Attending: Plastic Surgery | Admitting: Plastic Surgery

## 2019-05-20 ENCOUNTER — Other Ambulatory Visit: Payer: Self-pay

## 2019-05-20 DIAGNOSIS — Z01812 Encounter for preprocedural laboratory examination: Secondary | ICD-10-CM | POA: Insufficient documentation

## 2019-05-20 DIAGNOSIS — Z20828 Contact with and (suspected) exposure to other viral communicable diseases: Secondary | ICD-10-CM | POA: Diagnosis not present

## 2019-05-20 LAB — BASIC METABOLIC PANEL
Anion gap: 12 (ref 5–15)
BUN: 13 mg/dL (ref 6–20)
CO2: 24 mmol/L (ref 22–32)
Calcium: 9.9 mg/dL (ref 8.9–10.3)
Chloride: 102 mmol/L (ref 98–111)
Creatinine, Ser: 0.7 mg/dL (ref 0.44–1.00)
GFR calc Af Amer: >60 mL/min (ref >60)
GFR calc non Af Amer: >60 mL/min (ref >60)
Glucose, Bld: 108 mg/dL — ABNORMAL HIGH (ref 70–99)
Potassium: 4 mmol/L (ref 3.5–5.1)
Sodium: 138 mmol/L (ref 135–145)

## 2019-05-20 NOTE — Progress Notes (Signed)

## 2019-05-21 LAB — NOVEL CORONAVIRUS, NAA (HOSP ORDER, SEND-OUT TO REF LAB; TAT 18-24 HRS): SARS-CoV-2, NAA: NOT DETECTED

## 2019-05-24 ENCOUNTER — Encounter (HOSPITAL_BASED_OUTPATIENT_CLINIC_OR_DEPARTMENT_OTHER)
Admission: RE | Disposition: A | Discharge status: Home / Self Care | Payer: Self-pay | Source: Home / Self Care | Attending: Plastic Surgery

## 2019-05-24 ENCOUNTER — Ambulatory Visit (HOSPITAL_BASED_OUTPATIENT_CLINIC_OR_DEPARTMENT_OTHER): Payer: BC Managed Care – PPO | Admitting: Anesthesiology

## 2019-05-24 ENCOUNTER — Other Ambulatory Visit: Payer: Self-pay

## 2019-05-24 ENCOUNTER — Ambulatory Visit (HOSPITAL_BASED_OUTPATIENT_CLINIC_OR_DEPARTMENT_OTHER)
Admission: RE | Admit: 2019-05-24 | Discharge: 2019-05-24 | Disposition: A | Discharge status: Home / Self Care | Payer: BC Managed Care – PPO | Attending: Plastic Surgery | Admitting: Plastic Surgery

## 2019-05-24 ENCOUNTER — Encounter (HOSPITAL_BASED_OUTPATIENT_CLINIC_OR_DEPARTMENT_OTHER): Payer: Self-pay

## 2019-05-24 DIAGNOSIS — Y839 Surgical procedure, unspecified as the cause of abnormal reaction of the patient, or of later complication, without mention of misadventure at the time of the procedure: Secondary | ICD-10-CM | POA: Insufficient documentation

## 2019-05-24 DIAGNOSIS — F419 Anxiety disorder, unspecified: Secondary | ICD-10-CM | POA: Insufficient documentation

## 2019-05-24 DIAGNOSIS — Z79810 Long term (current) use of selective estrogen receptor modulators (SERMs): Secondary | ICD-10-CM | POA: Diagnosis not present

## 2019-05-24 DIAGNOSIS — Z853 Personal history of malignant neoplasm of breast: Secondary | ICD-10-CM | POA: Diagnosis not present

## 2019-05-24 DIAGNOSIS — K219 Gastro-esophageal reflux disease without esophagitis: Secondary | ICD-10-CM | POA: Diagnosis not present

## 2019-05-24 DIAGNOSIS — Z9011 Acquired absence of right breast and nipple: Secondary | ICD-10-CM | POA: Insufficient documentation

## 2019-05-24 DIAGNOSIS — Z9013 Acquired absence of bilateral breasts and nipples: Secondary | ICD-10-CM | POA: Diagnosis not present

## 2019-05-24 DIAGNOSIS — J45909 Unspecified asthma, uncomplicated: Secondary | ICD-10-CM | POA: Insufficient documentation

## 2019-05-24 DIAGNOSIS — Z421 Encounter for breast reconstruction following mastectomy: Secondary | ICD-10-CM | POA: Insufficient documentation

## 2019-05-24 DIAGNOSIS — Z9012 Acquired absence of left breast and nipple: Secondary | ICD-10-CM | POA: Insufficient documentation

## 2019-05-24 DIAGNOSIS — I10 Essential (primary) hypertension: Secondary | ICD-10-CM | POA: Insufficient documentation

## 2019-05-24 DIAGNOSIS — L7634 Postprocedural seroma of skin and subcutaneous tissue following other procedure: Secondary | ICD-10-CM | POA: Insufficient documentation

## 2019-05-24 HISTORY — PX: BREAST RECONSTRUCTION WITH PLACEMENT OF TISSUE EXPANDER AND ALLODERM: SHX6805

## 2019-05-24 SURGERY — BREAST RECONSTRUCTION WITH PLACEMENT OF TISSUE EXPANDER AND ALLODERM
Anesthesia: General | Site: Breast | Laterality: Left

## 2019-05-24 MED ORDER — EPHEDRINE 5 MG/ML INJ
INTRAVENOUS | Status: AC
  Filled 2019-05-24: qty 10

## 2019-05-24 MED ORDER — BUPIVACAINE HCL (PF) 0.5 % IJ SOLN
INTRAMUSCULAR | Status: DC | PRN
  Administered 2019-05-24: 15 mL

## 2019-05-24 MED ORDER — ACETAMINOPHEN 500 MG PO TABS
1000.0000 mg | ORAL_TABLET | ORAL | Status: AC
  Administered 2019-05-24: 1000 mg via ORAL

## 2019-05-24 MED ORDER — GABAPENTIN 300 MG PO CAPS
300.0000 mg | ORAL_CAPSULE | ORAL | Status: AC
  Administered 2019-05-24: 07:00:00 300 mg via ORAL

## 2019-05-24 MED ORDER — LIDOCAINE HCL (CARDIAC) PF 100 MG/5ML IV SOSY
PREFILLED_SYRINGE | INTRAVENOUS | Status: DC | PRN
  Administered 2019-05-24: 60 mg via INTRAVENOUS

## 2019-05-24 MED ORDER — FENTANYL CITRATE (PF) 100 MCG/2ML IJ SOLN
INTRAMUSCULAR | Status: AC
  Filled 2019-05-24: qty 2

## 2019-05-24 MED ORDER — PROPOFOL 10 MG/ML IV BOLUS
INTRAVENOUS | Status: DC | PRN
  Administered 2019-05-24: 300 mg via INTRAVENOUS

## 2019-05-24 MED ORDER — SODIUM CHLORIDE 0.9 % IV SOLN
INTRAVENOUS | Status: DC | PRN
  Administered 2019-05-24: 800 mL

## 2019-05-24 MED ORDER — LIDOCAINE 2% (20 MG/ML) 5 ML SYRINGE
INTRAMUSCULAR | Status: AC
  Filled 2019-05-24: qty 5

## 2019-05-24 MED ORDER — CHLORHEXIDINE GLUCONATE CLOTH 2 % EX PADS: 6.0000 | MEDICATED_PAD | Freq: Once | CUTANEOUS | Status: DC

## 2019-05-24 MED ORDER — CIPROFLOXACIN HCL 500 MG PO TABS: 500.0000 mg | ORAL_TABLET | Freq: Two times a day (BID) | ORAL | 0 refills | Status: AC

## 2019-05-24 MED ORDER — CELECOXIB 200 MG PO CAPS
ORAL_CAPSULE | ORAL | Status: AC
  Filled 2019-05-24: qty 1

## 2019-05-24 MED ORDER — METHOCARBAMOL 500 MG PO TABS: 500.0000 mg | ORAL_TABLET | Freq: Three times a day (TID) | ORAL | 0 refills | Status: DC | PRN

## 2019-05-24 MED ORDER — DEXAMETHASONE SODIUM PHOSPHATE 10 MG/ML IJ SOLN
INTRAMUSCULAR | Status: AC
  Filled 2019-05-24: qty 1

## 2019-05-24 MED ORDER — SODIUM CHLORIDE 0.9 % IV SOLN
INTRAVENOUS | Status: AC
  Filled 2019-05-24 (×2): qty 500000

## 2019-05-24 MED ORDER — HYDROCODONE-ACETAMINOPHEN 5-325 MG PO TABS: 1.0000 | ORAL_TABLET | ORAL | 0 refills | Status: DC | PRN

## 2019-05-24 MED ORDER — LACTATED RINGERS IV SOLN
INTRAVENOUS | Status: DC
  Administered 2019-05-24 (×2): via INTRAVENOUS

## 2019-05-24 MED ORDER — PROPOFOL 500 MG/50ML IV EMUL
INTRAVENOUS | Status: AC
  Filled 2019-05-24: qty 50

## 2019-05-24 MED ORDER — ONDANSETRON HCL 4 MG/2ML IJ SOLN
INTRAMUSCULAR | Status: DC | PRN
  Administered 2019-05-24: 4 mg via INTRAVENOUS

## 2019-05-24 MED ORDER — CLINDAMYCIN PHOSPHATE 900 MG/50ML IV SOLN
900.0000 mg | INTRAVENOUS | Status: AC
  Administered 2019-05-24: 900 mg via INTRAVENOUS

## 2019-05-24 MED ORDER — CLINDAMYCIN PHOSPHATE 900 MG/50ML IV SOLN
INTRAVENOUS | Status: AC
  Filled 2019-05-24: qty 50

## 2019-05-24 MED ORDER — SUCCINYLCHOLINE CHLORIDE 200 MG/10ML IV SOSY
PREFILLED_SYRINGE | INTRAVENOUS | Status: AC
  Filled 2019-05-24: qty 10

## 2019-05-24 MED ORDER — PHENYLEPHRINE 40 MCG/ML (10ML) SYRINGE FOR IV PUSH (FOR BLOOD PRESSURE SUPPORT)
PREFILLED_SYRINGE | INTRAVENOUS | Status: AC
  Filled 2019-05-24: qty 10

## 2019-05-24 MED ORDER — DEXAMETHASONE SODIUM PHOSPHATE 4 MG/ML IJ SOLN
INTRAMUSCULAR | Status: DC | PRN
  Administered 2019-05-24: 10 mg via INTRAVENOUS

## 2019-05-24 MED ORDER — SCOPOLAMINE 1 MG/3DAYS TD PT72
MEDICATED_PATCH | TRANSDERMAL | Status: DC | PRN
  Administered 2019-05-24: 1 via TRANSDERMAL

## 2019-05-24 MED ORDER — MIDAZOLAM HCL 2 MG/2ML IJ SOLN
1.0000 mg | INTRAMUSCULAR | Status: DC | PRN
  Administered 2019-05-24: 2 mg via INTRAVENOUS

## 2019-05-24 MED ORDER — FENTANYL CITRATE (PF) 100 MCG/2ML IJ SOLN
50.0000 ug | INTRAMUSCULAR | Status: AC | PRN
  Administered 2019-05-24 (×3): 25 ug via INTRAVENOUS
  Administered 2019-05-24: 50 ug via INTRAVENOUS
  Administered 2019-05-24: 25 ug via INTRAVENOUS
  Administered 2019-05-24: 50 ug via INTRAVENOUS

## 2019-05-24 MED ORDER — MIDAZOLAM HCL 2 MG/2ML IJ SOLN
INTRAMUSCULAR | Status: AC
  Filled 2019-05-24: qty 2

## 2019-05-24 MED ORDER — GABAPENTIN 300 MG PO CAPS
ORAL_CAPSULE | ORAL | Status: AC
  Filled 2019-05-24: qty 1

## 2019-05-24 MED ORDER — SCOPOLAMINE 1 MG/3DAYS TD PT72
MEDICATED_PATCH | TRANSDERMAL | Status: AC
  Filled 2019-05-24: qty 1

## 2019-05-24 MED ORDER — ACETAMINOPHEN 500 MG PO TABS
ORAL_TABLET | ORAL | Status: AC
  Filled 2019-05-24: qty 2

## 2019-05-24 MED ORDER — ONDANSETRON HCL 4 MG/2ML IJ SOLN
INTRAMUSCULAR | Status: AC
  Filled 2019-05-24: qty 2

## 2019-05-24 MED ORDER — CELECOXIB 200 MG PO CAPS
200.0000 mg | ORAL_CAPSULE | ORAL | Status: AC
  Administered 2019-05-24: 200 mg via ORAL

## 2019-05-24 MED ORDER — FENTANYL CITRATE (PF) 100 MCG/2ML IJ SOLN
25.0000 ug | INTRAMUSCULAR | Status: DC | PRN
  Administered 2019-05-24: 50 ug via INTRAVENOUS

## 2019-05-24 MED ORDER — POVIDONE-IODINE 10 % EX SOLN
CUTANEOUS | Status: DC | PRN
  Administered 2019-05-24: 1 via TOPICAL

## 2019-05-24 SURGICAL SUPPLY — 57 items
ALLOGRAFT PERF 16X20 1.6+/-0.4 (Tissue) ×2 IMPLANT
BAG DECANTER FOR FLEXI CONT (MISCELLANEOUS) ×2 IMPLANT
BINDER BREAST LRG (GAUZE/BANDAGES/DRESSINGS) IMPLANT
BINDER BREAST MEDIUM (GAUZE/BANDAGES/DRESSINGS) IMPLANT
BINDER BREAST XLRG (GAUZE/BANDAGES/DRESSINGS) ×2 IMPLANT
BINDER BREAST XXLRG (GAUZE/BANDAGES/DRESSINGS) IMPLANT
BLADE SURG 10 STRL SS (BLADE) ×2 IMPLANT
BLADE SURG 15 STRL LF DISP TIS (BLADE) ×1 IMPLANT
BLADE SURG 15 STRL SS (BLADE) ×1
BNDG GAUZE ELAST 4 BULKY (GAUZE/BANDAGES/DRESSINGS) IMPLANT
CANISTER SUCT 1200ML W/VALVE (MISCELLANEOUS) ×4 IMPLANT
COUNTER NEEDLE 1200 MAGNETIC (NEEDLE) IMPLANT
COVER MAYO STAND REUSABLE (DRAPES) ×4 IMPLANT
DERMABOND ADVANCED (GAUZE/BANDAGES/DRESSINGS) ×1
DERMABOND ADVANCED .7 DNX12 (GAUZE/BANDAGES/DRESSINGS) ×1 IMPLANT
DRAIN CHANNEL 15F RND FF W/TCR (WOUND CARE) ×2 IMPLANT
DRAIN CHANNEL 19F RND (DRAIN) ×2 IMPLANT
DRAPE HALF SHEET 70X43 (DRAPES) ×2 IMPLANT
DRAPE TOP ARMCOVERS (MISCELLANEOUS) ×2 IMPLANT
DRAPE U-SHAPE 76X120 STRL (DRAPES) ×2 IMPLANT
DRAPE UTILITY XL STRL (DRAPES) ×2 IMPLANT
DRSG PAD ABDOMINAL 8X10 ST (GAUZE/BANDAGES/DRESSINGS) ×4 IMPLANT
DRSG TEGADERM 4X10 (GAUZE/BANDAGES/DRESSINGS) ×4 IMPLANT
ELECT BLADE 4.0 EZ CLEAN MEGAD (MISCELLANEOUS) ×2
ELECT COATED BLADE 2.86 ST (ELECTRODE) ×2 IMPLANT
ELECTRODE BLDE 4.0 EZ CLN MEGD (MISCELLANEOUS) ×1 IMPLANT
EVACUATOR SILICONE 100CC (DRAIN) ×4 IMPLANT
EXPANDER TISSUE FV FOURTE 500 (Prosthesis & Implant Plastic) ×1 IMPLANT
GLOVE BIO SURGEON STRL SZ 6 (GLOVE) ×4 IMPLANT
GOWN STRL REUS W/ TWL LRG LVL3 (GOWN DISPOSABLE) ×3 IMPLANT
GOWN STRL REUS W/TWL LRG LVL3 (GOWN DISPOSABLE) ×3
KIT FILL SYSTEM UNIVERSAL (SET/KITS/TRAYS/PACK) IMPLANT
MARKER SKIN DUAL TIP RULER LAB (MISCELLANEOUS) IMPLANT
PIN SAFETY STERILE (MISCELLANEOUS) IMPLANT
PUNCH BIOPSY DERMAL 4MM (MISCELLANEOUS) IMPLANT
SPONGE LAP 18X18 RF (DISPOSABLE) ×6 IMPLANT
STAPLER VISISTAT 35W (STAPLE) ×2 IMPLANT
SUT CHROMIC 4 0 PS 2 18 (SUTURE) IMPLANT
SUT ETHILON 2 0 FS 18 (SUTURE) ×2 IMPLANT
SUT MNCRL AB 4-0 PS2 18 (SUTURE) ×2 IMPLANT
SUT PDS AB 2-0 CT2 27 (SUTURE) ×8 IMPLANT
SUT SILK 2 0 SH (SUTURE) IMPLANT
SUT VIC AB 3-0 SH 27 (SUTURE)
SUT VIC AB 3-0 SH 27X BRD (SUTURE) IMPLANT
SUT VICRYL 0 CT-2 (SUTURE) IMPLANT
SUT VICRYL 4-0 PS2 18IN ABS (SUTURE) IMPLANT
SUT VLOC 180 0 24IN GS25 (SUTURE) ×2 IMPLANT
SYR 50ML LL SCALE MARK (SYRINGE) ×2 IMPLANT
SYR BULB IRRIGATION 50ML (SYRINGE) ×2 IMPLANT
TAPE MEASURE VINYL STERILE (MISCELLANEOUS) ×2 IMPLANT
TISSUE EXPNDR FV FOURTE 500 (Prosthesis & Implant Plastic) ×2 IMPLANT
TOWEL GREEN STERILE FF (TOWEL DISPOSABLE) ×2 IMPLANT
TRAY DSU PREP LF (CUSTOM PROCEDURE TRAY) ×2 IMPLANT
TRAY FOLEY W/BAG SLVR 14FR LF (SET/KITS/TRAYS/PACK) IMPLANT
TUBE CONNECTING 20X1/4 (TUBING) ×2 IMPLANT
UNDERPAD 30X36 HEAVY ABSORB (UNDERPADS AND DIAPERS) ×4 IMPLANT
YANKAUER SUCT BULB TIP NO VENT (SUCTIONS) ×4 IMPLANT

## 2019-05-24 NOTE — Anesthesia Postprocedure Evaluation (Signed)
Anesthesia Post Note  Patient: Kyliyah Margaretha Glassing  Procedure(s) Performed: BREAST RECONSTRUCTION WITH PLACEMENT OF TISSUE EXPANDER AND ALLODERM (Left Breast)     Patient location during evaluation: PACU Anesthesia Type: General Level of consciousness: awake and alert Pain management: pain level controlled Vital Signs Assessment: post-procedure vital signs reviewed and stable Respiratory status: spontaneous breathing, nonlabored ventilation, respiratory function stable and patient connected to nasal cannula oxygen Cardiovascular status: blood pressure returned to baseline and stable Postop Assessment: no apparent nausea or vomiting Anesthetic complications: no    Last Vitals:  Vitals:   05/24/19 1145 05/24/19 1155  BP: 124/77 133/88  Pulse: 90 91  Resp: 12 16  Temp:  36.9 C  SpO2: 96% 93%    Last Pain:  Vitals:   05/24/19 1155  TempSrc:   PainSc: 5                  Monet [place] L Maesyn Frisinger

## 2019-05-24 NOTE — H&P (Signed)
Subjective:     Patient ID: Christine Francis is a 58 y.o. female.  HPI  3 months post op mastectomies and subsequent reexcision left mastectomy flap for margins, removal TE. Here for replacement left chest tissue expander.  First seen in consultation 2016. At that time, screening MMG revealed abnormalities that led to a biopsy with LCIS. She underwent lumpectomy at that time and has been on tamoxifen. Diagnostic MMG  11/2018 showed a 2 cm mass in the left breast. US showed the mass to be 2.5 cm at 5 o clock 5 cmfn, one small axillary node noted. Biopsy demonstrated invasive mammary carcinoma and invasive mammary carcinoma in situ, ER/PR+ HER2 -.   Patient has had prior lumpectomy 11/2009 for ADH. Prior fibroadenoma excision as well.   Final pathology 2.1 cm ILC with LCIS, invasive carcinoma broadly present at the anterior margin. Patient underwent removal left TE/ADM and re excision mastectomy flap left which represented the buried dermal pedicle of SRM. Final pathology with ALH, no invasive component. Oncotype 24/10%. On anastrazole.  Genetics negative.  Patient has had difficulty in completing breast MRI due to claustrophobia.   Prior 18 D, desires to be smaller. 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 Sister lives in [place], she is IP OB RN with [employer].   Review of Systems     Objective:   Physical Exam  Cardiovascular: Normal rate. Normal heart sounds Pulmonary/Chest: Effort normal. Clear to auscultation  Chest: left chest scars maturing, Right chest soft expanded, near lateral extent of IMF scar with 1 mm opening and scant serous drainage/blood no cellulitis, there is some maceration of skin adjacent to this No visible rippling right    Assessment:     Hx LCIS right breast s/p lumpectomy Left breast invasive cancer LOQ, ER+ S/p bilateral SRM, left SLN, prepectoral TE/ADM (Alloderm)  reconstruction S/p reexcision left mastectomy flap, removal left TE/ADM    Plan:     Plan replacement left TE with acellular dermis. Reviewed will utilize same scars, drains, OP surgery. Reviewed post op restrictions, anticipated time off work.    Natrelle 133S-FV-13-T 500 ml tissue expanders placed bilateral,  RIGHT fill volume 480 ml saline   [name], MD [facility] Plastic & Reconstructive Surgery

## 2019-05-24 NOTE — Anesthesia Preprocedure Evaluation (Addendum)
Anesthesia Evaluation  Patient identified by MRN, date of birth, ID band Patient awake    Reviewed: Allergy & Precautions, H&P , NPO status , Patient's Chart, lab work & pertinent test results  History of Anesthesia Complications (+) PONV and history of anesthetic complications  Airway Mallampati: II  TM Distance: >3 FB Neck ROM: full    Dental  (+) Teeth Intact, Dental Advisory Given   Pulmonary asthma ,    breath sounds clear to auscultation       Cardiovascular hypertension, Pt. on medications and Pt. on home beta blockers  Rhythm:regular Rate:Normal     Neuro/Psych PSYCHIATRIC DISORDERS Anxiety    GI/Hepatic GERD  Medicated and Controlled,  Endo/Other    Renal/GU      Musculoskeletal   Abdominal   Peds  Hematology   Anesthesia Other Findings   Reproductive/Obstetrics Breast Ca                            Anesthesia Physical  Anesthesia Plan  ASA: II  Anesthesia Plan: General   Post-op Pain Management:    Induction: Intravenous  PONV Risk Score and Plan: 4 or greater and Ondansetron, Dexamethasone, Midazolam, Scopolamine patch - Pre-op and Treatment may vary due to age or medical condition  Airway Management Planned: LMA  Additional Equipment:   Intra-op Plan:   Post-operative Plan:   Informed Consent: I have reviewed the patients History and Physical, chart, labs and discussed the procedure including the risks, benefits and alternatives for the proposed anesthesia with the patient or authorized representative who has indicated his/her understanding and acceptance.       Plan Discussed with: CRNA, Anesthesiologist and Surgeon  Anesthesia Plan Comments:         Anesthesia Quick Evaluation  

## 2019-05-24 NOTE — Discharge Instructions (Signed)
No Tylenol before 1:00pm.  No ibuprofen before 3:00pm  Post Anesthesia Home Care Instructions  Activity: Get plenty of rest for the remainder of the day. A responsible individual must stay with you for 24 hours following the procedure.  For the next 24 hours, DO NOT: -Drive a car -Paediatric nurse -Drink alcoholic beverages -Take any medication unless instructed by your physician -Make any legal decisions or sign important papers.  Meals: Start with liquid foods such as gelatin or soup. Progress to regular foods as tolerated. Avoid greasy, spicy, heavy foods. If nausea and/or vomiting occur, drink only clear liquids until the nausea and/or vomiting subsides. Call your physician if vomiting continues.  Special Instructions/Symptoms: Your throat may feel dry or sore from the anesthesia or the breathing tube placed in your throat during surgery. If this causes discomfort, gargle with warm salt water. The discomfort should disappear within 24 hours.  If you had a scopolamine patch placed behind your ear for the management of post- operative nausea and/or vomiting:  1. The medication in the patch is effective for 72 hours, after which it should be removed.  Wrap patch in a tissue and discard in the trash. Wash hands thoroughly with soap and water. 2. You may remove the patch earlier than 72 hours if you experience unpleasant side effects which may include dry mouth, dizziness or visual disturbances. 3. Avoid touching the patch. Wash your hands with soap and water after contact with the patch.         JP Drain [employer] this sheet to all of your post-operative appointments while you have your drains.  Please measure your drains by CC's or ML's.  Make sure you drain and measure your JP Drains 2 or 3 times per day.  At the end of each day, add up totals for the left side and add up totals for the right side.    ( 9 am )     ( 3 pm )        ( 9 pm )                Date L  R  L  R   L  R  Total L/R

## 2019-05-24 NOTE — Transfer of Care (Signed)
Immediate Anesthesia Transfer of Care Note  Patient: Christine Francis  Procedure(s) Performed: BREAST RECONSTRUCTION WITH PLACEMENT OF TISSUE EXPANDER AND ALLODERM (Left Breast)  Patient Location: PACU  Anesthesia Type:General  Level of Consciousness: sedated  Airway & Oxygen Therapy: Patient Spontanous Breathing and Patient connected to face mask oxygen  Post-op Assessment: Report given to RN and Post -op Vital signs reviewed and stable  Post vital signs: Reviewed and stable  Last Vitals:  Vitals Value Taken Time  BP    Temp    Pulse 91 05/24/19 0950  Resp 10 05/24/19 0950  SpO2 96 % 05/24/19 0950  Vitals shown include unvalidated device data.  Last Pain:  Vitals:   05/24/19 0634  TempSrc: Oral      Patients Stated Pain Goal: 3 ([id number])  Complications: No apparent anesthesia complications

## 2019-05-24 NOTE — Anesthesia Procedure Notes (Signed)
Procedure Name: LMA Insertion Date/Time: 05/24/2019 7:34 AM Performed by: Willa Frater, CRNA Pre-anesthesia Checklist: Patient identified, Emergency Drugs available, Suction available and Patient being monitored Patient Re-evaluated:Patient Re-evaluated prior to induction Oxygen Delivery Method: Circle system utilized Preoxygenation: Pre-oxygenation with 100% oxygen Induction Type: IV induction Ventilation: Mask ventilation without difficulty LMA: LMA inserted LMA Size: 4.0 Number of attempts: 1 Airway Equipment and Method: Bite block Placement Confirmation: positive ETCO2 Tube secured with: Tape Dental Injury: Teeth and Oropharynx as per pre-operative assessment

## 2019-05-24 NOTE — Op Note (Signed)
Operative Note   DATE OF OPERATION: 11.10.20  LOCATION:  [facility]  SURGICAL DIVISION: Plastic Surgery  PREOPERATIVE DIAGNOSES:  1. History breast cancer 2. Acquired absence breasts  POSTOPERATIVE DIAGNOSES:  same  PROCEDURE:  1. Left breast reconstruction with tissue expander 2. Acellular dermis (Alloderm) 300cm2 for breast reconstruction  SURGEON: [name] MD MBA  ASSISTANT: none  ANESTHESIA:  General.   EBL: 25 ml  COMPLICATIONS: None immediate.   INDICATIONS FOR PROCEDURE:  The patient, Christine Francis, is a 58 y.o. female born on [birth year], is here for delayed breast reconstruction following left skin reduction pattern mastectomy.   FINDINGS: Natrelle 133S-FV-13 T 500 ml tissue expander placed, initial fill volume 150 ml saline. SN [id number]  DESCRIPTION OF PROCEDURE:  The patient's operative site was marked with the patient in the preoperative area.The patientwas taken to the operating room. SCDs were placed and IV antibiotics were given. The patient's operative site was prepped and draped in a sterile fashion. A time out was performed and all information was confirmed to be correct.   Incision made in priorvertical and lateral portion inframammary foldmastectomy scars and skin flaps elevated off pectoralis to dimensions of expander. Small seroma cavity over lower outer quadrant excised. Local anestheticinfiltrated.PerforatedAlloderm was inset to chest wall with 2-0 PDSapproximately 4 cm superior to inframammary fold. 0-V lock then used to secure acellular dermis to inframammary fold. Cavity irrigated with saline solution containing Ancef, gentamicin and bacitracin, followed by Betadine saline solution. Expander prepared and placed over pectoralis and secured to lateral sternal border with 2-0 PDS. Additional superior and lateral tabs secured to pectoralis muscle. Acelullar dermis then redraped over anterior surface expander and trimmed. The  border of acellular dermis secured to pectoralis and serratus with interrupted 2-0 PDS. 15 Fr JP placed along inframammary fold and 19 Fr JP along lateral border. Both secured 2-0 nylon. Skin closure completed with 3-0 vicryl in superficial fascia, 4-0 vicryl in dermis and 4-0 monocryl subcuticular. Tissue adhesive applied over both incisions. Port accessed and expander filled to 114ml. Tegaderms applied folllowed by dry dressing and breast binder.  The patient was allowed to wake from anesthesia, extubated and taken to the recovery room in satisfactory condition.   SPECIMENS: none  DRAINS: 15 and 19 Fr JP in left breast reconstruction  [name], MD [facility] Plastic & Reconstructive Surgery

## 2019-05-25 ENCOUNTER — Encounter (HOSPITAL_BASED_OUTPATIENT_CLINIC_OR_DEPARTMENT_OTHER): Payer: Self-pay | Admitting: Plastic Surgery

## 2019-06-07 ENCOUNTER — Telehealth: Payer: Self-pay | Admitting: Adult Health

## 2019-06-07 NOTE — Telephone Encounter (Signed)
I talk with patient regarding video visit °

## 2019-06-13 DIAGNOSIS — D0501 Lobular carcinoma in situ of right breast: Secondary | ICD-10-CM | POA: Diagnosis not present

## 2019-06-13 DIAGNOSIS — C50912 Malignant neoplasm of unspecified site of left female breast: Secondary | ICD-10-CM | POA: Diagnosis not present

## 2019-06-14 ENCOUNTER — Encounter: Payer: Self-pay | Admitting: Adult Health

## 2019-06-14 ENCOUNTER — Inpatient Hospital Stay: Payer: BC Managed Care – PPO | Attending: Adult Health | Admitting: Adult Health

## 2019-06-14 DIAGNOSIS — Z170 Estrogen receptor positive status [ER+]: Secondary | ICD-10-CM

## 2019-06-14 DIAGNOSIS — C50412 Malignant neoplasm of upper-outer quadrant of left female breast: Secondary | ICD-10-CM

## 2019-06-14 NOTE — Progress Notes (Signed)
SURVIVORSHIP VIRTUAL VISIT:  I connected with Christine Francis on 06/14/19 at  2:00 PM EST by my chart video and verified that I am speaking with the correct person using two identifiers.  I discussed the limitations, risks, security and privacy concerns of performing an evaluation and management service by telephone and the availability of in person appointments. I also discussed with the patient that there may be a patient responsible charge related to this service. The patient expressed understanding and agreed to proceed.   Patient location: at home private location Provider location: [place]  BRIEF ONCOLOGIC HISTORY:  Oncology History  Malignant neoplasm of upper-outer quadrant of left breast in female, estrogen receptor positive ([place])  07/19/2014 Initial Biopsy   Rt.Breast Biopsy: LCIS   11/29/2014 Surgery   Right lumpectomy: LCIS with fibrocystic changes with usual ductal hyperplasia,PASH, 0.8 cm margins for LCIS   02/06/2015 -  Anti-estrogen oral therapy   tamoxifen 20 mg daily   12/08/2018 Relapse/Recurrence   Left breast 2 cm mass at 7 o clock position: ILC grade 2, ER 90%, PR 30%, Her 2 neg, Ki 67: 15% T1CN0 stage 1A   02/07/2019 Surgery   Bilateral mastectomies with reconstruction ([facility]i, [place]) Right breast: atypical lobular hyperplasia and no evidence of carcinoma in 1 lymph node. Left breast: invasive lobular carcinoma with LCIS, grade 2, 2.1cm, 2 lymph nodes negative for carcinoma, and invasive carcinoma broadly present at the anterior margin.    02/07/2019 Cancer Staging   Staging form: Breast, AJCC 8th Edition - Pathologic stage from 02/07/2019: Stage IA (pT2, pN0, cM0, G2, ER+, PR+, HER2-, Oncotype DX score: 24) - Signed by Gardenia Phlegm, NP on 03/02/2019   02/07/2019 Oncotype testing   24/10%; no benefit from chemo   02/2019 -  Anti-estrogen oral therapy   Anastrozole daily     INTERVAL HISTORY:  Christine Francis to review her survivorship care plan detailing her  treatment course for breast cancer, as well as monitoring long-term side effects of that treatment, education regarding health maintenance, screening, and overall wellness and health promotion.     Overall, Christine Francis reports feeling moderately well.  She is taking Anastrozole daily and she is having difficulty with the increased arthralgias.  She has no other issues such as hot flashes, or vaginal dryness.  She does note that she feels an old woman, and struggles with this.    REVIEW OF SYSTEMS:  Review of Systems  Constitutional: Negative for appetite change, chills, fatigue, fever and unexpected weight change.  HENT:   Negative for hearing loss, lump/mass and trouble swallowing.   Eyes: Negative for eye problems and icterus.  Respiratory: Negative for chest tightness, cough and wheezing.   Cardiovascular: Negative for chest pain, leg swelling and palpitations.  Gastrointestinal: Negative for abdominal distention, abdominal pain, constipation, diarrhea, nausea and vomiting.  Endocrine: Negative for hot flashes.  Musculoskeletal: Positive for arthralgias.  Skin: Negative for itching.  Neurological: Negative for dizziness, extremity weakness, headaches and numbness.  Hematological: Negative for adenopathy. Does not bruise/bleed easily.  Psychiatric/Behavioral: Negative for depression. The patient is not nervous/anxious.   Breast: Denies any new nodularity, masses, tenderness, nipple changes, or nipple discharge.    ONCOLOGY TREATMENT TEAM:  1. Surgeon:  Dr. Georgette Dover at [facility] 2. Medical Oncologist: Dr. Lindi Adie  3. Plastic Surgeon: Dr. Iran Planas    PAST MEDICAL/SURGICAL HISTORY:  Past Medical History:  Diagnosis Date  . Allergy    allergy shots weekly  . Anxiety    no meds  currently  . Asthma   . Cancer ([place])    skin- basil cancer face, chest and left leg  . Complication of anesthesia   . GERD (gastroesophageal reflux disease)   . Hyperlipidemia    diet controlled,  no meds  . Hypertension   . IBS (irritable bowel syndrome)    no problems currently  . Personal history of colonic adenomas 11/17/2005   11/17/2005 - 3 adenomas - one  A TV adenoma was 12 mm (max)  . PONV (postoperative nausea and vomiting)    Past Surgical History:  Procedure Laterality Date  . BIOPSY BREAST  2000   right; benign  . BREAST BIOPSY  2012   right breast; pre cancerous  . BREAST LUMPECTOMY Right 04/1999, 11/2009  . BREAST LUMPECTOMY WITH RADIOACTIVE SEED LOCALIZATION Right 11/29/2014   Procedure: BREAST LUMPECTOMY WITH RADIOACTIVE SEED LOCALIZATION;  Surgeon: Donnie Mesa, MD;  Location: [place];  Service: General;  Laterality: Right;  . BREAST RECONSTRUCTION WITH PLACEMENT OF TISSUE EXPANDER AND ALLODERM Bilateral 02/07/2019   Procedure: BILATERAL BREAST RECONSTRUCTION WITH PLACEMENT OF TISSUE EXPANDER AND ALLODERM;  Surgeon: [name], MD;  Location: [place];  Service: Plastics;  Laterality: Bilateral;  . BREAST RECONSTRUCTION WITH PLACEMENT OF TISSUE EXPANDER AND ALLODERM Left 05/24/2019   Procedure: BREAST RECONSTRUCTION WITH PLACEMENT OF TISSUE EXPANDER AND ALLODERM;  Surgeon: [name], MD;  Location: [place];  Service: Plastics;  Laterality: Left;  . BUNIONECTOMY  1986   bilateral  . cervical cryosurgery  12/2013  . COLONOSCOPY     x 2  . DIAGNOSTIC LAPAROSCOPY    . laproscopy  1998  . MASTECTOMY W/ SENTINEL NODE BIOPSY Bilateral 02/07/2019   Procedure: BILATERAL MASTECTOMIES WITH LEFT SENTINEL LYMPH NODE BIOPSY;  Surgeon: Donnie Mesa, MD;  Location: [place];  Service: General;  Laterality: Bilateral;  PEC BLOCK  . PARATHYROIDECTOMY  2005  . RE-EXCISION OF BREAST LUMPECTOMY Left 02/24/2019   Procedure: RE-EXCISION LEFT MASTECTOMY- LEFT DERMAL FLAP;  Surgeon: Donnie Mesa, MD;  Location: [place];  Service: General;  Laterality: Left;  . REMOVAL OF TISSUE EXPANDER AND PLACEMENT OF IMPLANT Left 02/24/2019    Procedure: REMOVAL OF LEFT CHEST TISSUE EXPANDER WITH ALLODERM;  Surgeon: [name], MD;  Location: [place];  Service: Plastics;  Laterality: Left;  . TISSUE EXPANDER FILLING Right 02/24/2019   Procedure: RIGHT CHEST TISSUE EXPANDER FILLING;  Surgeon: [name], MD;  Location: [place];  Service: Plastics;  Laterality: Right;  (436m 0.9% Normal Saline)  . uterine bx     thickening of uterus was the reason for havin bx  . WISDOM TOOTH EXTRACTION       ALLERGIES:  Allergies  Allergen Reactions  . Ampicillin Hives  . Dilaudid [Hydromorphone Hcl] Nausea And Vomiting  . Erythromycin Hives and Other (See Comments)    And all other mycins  . Penicillins Hives  . Sulfa Drugs Cross Reactors Hives  . Declomycin [Demeclocycline] Rash and Other (See Comments)    Childhood allergy     CURRENT MEDICATIONS:  Outpatient Encounter Medications as of 06/14/2019  Medication Sig Note  . albuterol (PROVENTIL HFA;VENTOLIN HFA) 108 (90 BASE) MCG/ACT inhaler Inhale 1-2 puffs into the lungs every 6 (six) hours as needed for wheezing or shortness of breath.    . anastrozole (ARIMIDEX) 1 MG tablet Take 1 tablet (1 mg total) by mouth daily.   .Marland Kitchenatenolol-chlorthalidone (TENORETIC) 50-25 MG per tablet Take 1 tablet  by mouth daily.     . Calcium Carb-Cholecalciferol (CALCIUM 600+D3 PO) Take 2 tablets by mouth daily.   . Cholecalciferol (EQL VITAMIN D3) 50 MCG (2000 UT) CAPS Take 2,000 Units by mouth daily.   Marland Kitchen EPINEPHrine 0.3 mg/0.3 mL IJ SOAJ injection Inject 0.3 mg into the muscle as needed for anaphylaxis. 02/17/2019: Pt has never needed   . HYDROcodone-acetaminophen (NORCO/VICODIN) 5-325 MG tablet Take 1 tablet by mouth every 4 (four) hours as needed for moderate pain.   Marland Kitchen ibuprofen (ADVIL) 200 MG tablet Take 600 mg by mouth every 6 (six) hours as needed for headache or moderate pain.   . methocarbamol (ROBAXIN) 500 MG tablet Take 1 tablet (500 mg total) by  mouth every 8 (eight) hours as needed for muscle spasms.   . montelukast (SINGULAIR) 10 MG tablet Take 10 mg by mouth daily.    Marland Kitchen omeprazole (PRILOSEC OTC) 20 MG tablet Take 20 mg by mouth daily.   Marland Kitchen oxyCODONE (OXY IR/ROXICODONE) 5 MG immediate release tablet Take 1 tablet (5 mg total) by mouth every 6 (six) hours as needed for severe pain.   Marland Kitchen oxyCODONE (ROXICODONE) 5 MG immediate release tablet Take 1 tablet (5 mg total) by mouth every 4 (four) hours as needed.   . potassium chloride SA (KLOR-CON M20) 20 MEQ tablet Take 20 mEq by mouth daily.     No facility-administered encounter medications on file as of 06/14/2019.      ONCOLOGIC FAMILY HISTORY:  Family History  Problem Relation Age of Onset  . Hypertension Father   . Cancer Father        melanoma/skin cancer  . Colon polyps Father   . Colon cancer Neg Hx   . Rectal cancer Neg Hx   . Stomach cancer Neg Hx      GENETIC COUNSELING/TESTING: negative  SOCIAL HISTORY:  Social History   Socioeconomic History  . Marital status: Single    Spouse name: Not on file  . Number of children: Not on file  . Years of education: Not on file  . Highest education level: Not on file  Occupational History  . 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[employer]    Employer: [employer]  . Financial resource strain: Not on file  . Food insecurity    Worry: Not on file    Inability: Not on file  . Transportation needs    Medical: Not on file    Non-medical: Not on file  Tobacco Use  . Smoking status: Never Smoker  . Smokeless tobacco: Never Used  Substance and Sexual Activity  . Alcohol use: Yes    Alcohol/week: 1.0 standard drinks    Types: 1 Glasses of wine per week  . Drug use: No  . Sexual activity: Yes    Birth control/protection: Post-menopausal  Lifestyle  . Physical activity    Days per week: Not on file    Minutes per session: Not on file  . Stress: Not on file  Relationships  . Social Product manager on phone: Not on file    Gets together: Not on file    Attends religious service: Not on file    Active member of club or organization: Not on file    Attends meetings of clubs or organizations: Not on file    Relationship status: Not on file  . Intimate partner violence    Fear of current or ex partner: Not on file  Emotionally abused: Not on file    Physically abused: Not on file    Forced sexual activity: Not on file  Other Topics Concern  . Not on file  Social History Narrative  . Not on file     OBSERVATIONS/OBJECTIVE:  Appears well.  In no apparent distress.  Mood and behavior are normal.  Speech is normal.  Breathing is non labored.    LABORATORY DATA:  None for this visit.  DIAGNOSTIC IMAGING:  None for this visit.      ASSESSMENT AND PLAN:  Ms.. Francis is a pleasant 58 y.o. female with Stage IA left breast invasive lobular carcinoma, ER+/PR+/HER2-, diagnosed in 11/2018, treated with bilateral mastectomies, and anti-estrogen therapy with Anastrozole beginning in 02/2019.  She presents to the Survivorship Clinic for our initial meeting and routine follow-up post-completion of treatment for breast cancer.    1. Stage IA left breast cancer:  Christine Francis is continuing to recover from definitive treatment for breast cancer. She will follow-up with her medical oncologist, Dr. Lindi Adie in 3 months with history and physical exam per surveillance protocol.  She will continue her anti-estrogen therapy with Anastrozole (#2).    Today, a comprehensive survivorship care plan and treatment summary was reviewed with the patient today detailing her breast cancer diagnosis, treatment course, potential late/long-term effects of treatment, appropriate follow-up care with recommendations for the future, and patient education resources.  A copy of this summary, along with a letter will be sent to the patient's primary care provider via mail/fax/In Basket message after today's visit.    2.  Arthralgias:  Christine and I talked about this in detail.  This is secondary to the Anastrozole, however, could currently be exacerbated by having a few surgeries this past 3 months and decreased mobility.  She has f/u with Dr. Iran Planas tomorrow and will talk to her further about what types of exercises she can do at this point.  I recommended that she exercise, and do yoga/tai chi to help with her arthralgias.  Should the arthralgias persist, I reviewed the possibility of taking a 2 week holiday from Anastrozole and consideration of switching to exemestane.  She will let us know if the issues persist, or worsen.    3. Bone health:  Given Christine Francis's age/history of breast cancer and her current treatment regimen including anti-estrogen therapy with Anastrozole, she is at risk for bone demineralization.  Dr. Matthew Saras typically checks her bone density, and she will get these results from him at her next visit and bring a copy to her f/u with Dr. Lindi Adie in 3 months.  She was given education on specific activities to promote bone health.  4. Cancer screening:  Due to Christine Francis's history and her age, she should receive screening for skin cancers, colon cancer, and gynecologic cancers.  The information and recommendations are listed on the patient's comprehensive care plan/treatment summary and were reviewed in detail with the patient.    5. Health maintenance and wellness promotion: Christine Francis was encouraged to consume 5-7 servings of fruits and vegetables per day. We reviewed the "Nutrition Rainbow" handout, as well as the handout "Take Control of Your Health and Reduce Your Cancer Risk" from the [facility].  She was also encouraged to engage in moderate to vigorous exercise for 30 minutes per day most days of the week. We discussed the [employer] fitness program, which is designed for cancer survivors to help them become more physically fit after cancer treatments.  She  was instructed to limit her  alcohol consumption and continue to abstain from tobacco use.     6. Support services/counseling: It is not uncommon for this period of the patient's cancer care trajectory to be one of many emotions and stressors.  We discussed how this can be increasingly difficult during the times of quarantine and social distancing due to the COVID-19 pandemic.   She was given information regarding our available services and encouraged to contact me with any questions or for help enrolling in any of our support group/programs.    Follow up instructions:    -Return to cancer center in three months for f/u with Dr. Lindi Adie  -She is welcome to return back to the Survivorship Clinic at any time; no additional follow-up needed at this time.  -Consider referral back to survivorship as a long-term survivor for continued surveillance  The patient was provided an opportunity to ask questions and all were answered. The patient agreed with the plan and demonstrated an understanding of the instructions.   The patient was advised to call back or seek an in-person evaluation if the symptoms worsen or if the condition fails to improve as anticipated.   I provided 40 minutes of face-to-face video visit time during this encounter, and > 50% was spent counseling as documented under my assessment & plan.  Scot Dock, NP

## 2019-06-15 ENCOUNTER — Telehealth: Payer: Self-pay | Admitting: Hematology and Oncology

## 2019-06-15 NOTE — Telephone Encounter (Signed)
I left a message regarding schedule  

## 2019-06-22 DIAGNOSIS — R309 Painful micturition, unspecified: Secondary | ICD-10-CM | POA: Diagnosis not present

## 2019-06-22 DIAGNOSIS — R87612 Low grade squamous intraepithelial lesion on cytologic smear of cervix (LGSIL): Secondary | ICD-10-CM | POA: Diagnosis not present

## 2019-06-22 DIAGNOSIS — Z0142 Encounter for cervical smear to confirm findings of recent normal smear following initial abnormal smear: Secondary | ICD-10-CM | POA: Diagnosis not present

## 2019-09-11 NOTE — Progress Notes (Signed)
Patient Care Team: Molli Posey, MD as PCP - General (Obstetrics and Gynecology) [name], MD as Consulting Physician (Plastic Surgery) Donnie Mesa, MD as Consulting Physician (General Surgery) Nicholas Lose, MD as Consulting Physician (Hematology and Oncology)  DIAGNOSIS:    ICD-10-CM   1. Malignant neoplasm of upper-outer quadrant of left breast in female, estrogen receptor positive ([place])  C50.412    Z17.0     SUMMARY OF ONCOLOGIC HISTORY: Oncology History  Malignant neoplasm of upper-outer quadrant of left breast in female, estrogen receptor positive ([place])  07/19/2014 Initial Biopsy   Rt.Breast Biopsy: LCIS   11/29/2014 Surgery   Right lumpectomy: LCIS with fibrocystic changes with usual ductal hyperplasia,PASH, 0.8 cm margins for LCIS   02/06/2015 -  Anti-estrogen oral therapy   tamoxifen 20 mg daily   12/08/2018 Relapse/Recurrence   Left breast 2 cm mass at 7 o clock position: ILC grade 2, ER 90%, PR 30%, Her 2 neg, Ki 67: 15% T1CN0 stage 1A   02/07/2019 Surgery   Bilateral mastectomies with reconstruction ([place], [place]) Right breast: atypical lobular hyperplasia and no evidence of carcinoma in 1 lymph node. Left breast: invasive lobular carcinoma with LCIS, grade 2, 2.1cm, 2 lymph nodes negative for carcinoma, and invasive carcinoma broadly present at the anterior margin.    02/07/2019 Cancer Staging   Staging form: Breast, AJCC 8th Edition - Pathologic stage from 02/07/2019: Stage IA (pT2, pN0, cM0, G2, ER+, PR+, HER2-, Oncotype DX score: 24) - Signed by Gardenia Phlegm, NP on 03/02/2019   02/07/2019 Oncotype testing   24/10%; no benefit from chemo   02/2019 -  Anti-estrogen oral therapy   Anastrozole daily     CHIEF COMPLIANT: Follow-up of bilateral breast cancers on anastrozole  INTERVAL HISTORY: Christine Francis is a 60 y.o. with above-mentioned history of right breast cancer with recent recurrence in the left breast. She underwent  bilateral mastectomies with reconstruction followed by an excision of the anterior margin. She is currently on anastrozole. She presents to the clinic today for follow-up.  She complains of severe body aches and pains.  Because her job is very sedentary, working for [employer], she has been also gaining weight.  ALLERGIES:  is allergic to ampicillin; dilaudid [hydromorphone hcl]; erythromycin; penicillins; sulfa drugs cross reactors; and declomycin [demeclocycline].  MEDICATIONS:  Current Outpatient Medications  Medication Sig Dispense Refill  . albuterol (PROVENTIL HFA;VENTOLIN HFA) 108 (90 BASE) MCG/ACT inhaler Inhale 1-2 puffs into the lungs every 6 (six) hours as needed for wheezing or shortness of breath.     . anastrozole (ARIMIDEX) 1 MG tablet Take 1 tablet (1 mg total) by mouth daily. 90 tablet 3  . atenolol-chlorthalidone (TENORETIC) 50-25 MG per tablet Take 1 tablet by mouth daily.      . Calcium Carb-Cholecalciferol (CALCIUM 600+D3 PO) Take 2 tablets by mouth daily.    . Cholecalciferol (EQL VITAMIN D3) 50 MCG (2000 UT) CAPS Take 2,000 Units by mouth daily.    Marland Kitchen EPINEPHrine 0.3 mg/0.3 mL IJ SOAJ injection Inject 0.3 mg into the muscle as needed for anaphylaxis.    Marland Kitchen HYDROcodone-acetaminophen (NORCO/VICODIN) 5-325 MG tablet Take 1 tablet by mouth every 4 (four) hours as needed for moderate pain. 20 tablet 0  . ibuprofen (ADVIL) 200 MG tablet Take 600 mg by mouth every 6 (six) hours as needed for headache or moderate pain.    . methocarbamol (ROBAXIN) 500 MG tablet Take 1 tablet (500 mg total) by mouth every 8 (eight) hours as needed  for muscle spasms. 30 tablet 0  . montelukast (SINGULAIR) 10 MG tablet Take 10 mg by mouth daily.     Marland Kitchen omeprazole (PRILOSEC OTC) 20 MG tablet Take 20 mg by mouth daily.    Marland Kitchen oxyCODONE (OXY IR/ROXICODONE) 5 MG immediate release tablet Take 1 tablet (5 mg total) by mouth every 6 (six) hours as needed for severe pain. 20 tablet 0  . oxyCODONE (ROXICODONE) 5  MG immediate release tablet Take 1 tablet (5 mg total) by mouth every 4 (four) hours as needed. 40 tablet 0  . potassium chloride SA (KLOR-CON M20) 20 MEQ tablet Take 20 mEq by mouth daily.      No current facility-administered medications for this visit.    PHYSICAL EXAMINATION: ECOG PERFORMANCE STATUS: 1 - Symptomatic but completely ambulatory  Vitals:   09/12/19 1035  BP: 131/70  Pulse: 67  Resp: 18  Temp: 98.9 F (37.2 C)  SpO2: 98%   Filed Weights   09/12/19 1035  Weight: 196 lb 12.8 oz (89.3 kg)    LABORATORY DATA:  I have reviewed the data as listed CMP Latest Ref Rng & Units 05/20/2019 02/08/2019 02/03/2019  Glucose 70 - 99 mg/dL 108(H) 131(H) 105(H)  BUN 6 - 20 mg/dL 13 25(H) 15  Creatinine 0.44 - 1.00 mg/dL 0.70 1.19(H) 0.78  Sodium 135 - 145 mmol/L 138 137 140  Potassium 3.5 - 5.1 mmol/L 4.0 3.5 3.2(L)  Chloride 98 - 111 mmol/L 102 103 102  CO2 22 - 32 mmol/L 24 26 25   Calcium 8.9 - 10.3 mg/dL 9.9 8.6(L) 9.4  Total Protein 6.4 - 8.3 g/dL - - -  Total Bilirubin 0.20 - 1.20 mg/dL - - -  Alkaline Phos 40 - 150 U/L - - -  AST 5 - 34 U/L - - -  ALT 0 - 55 U/L - - -    Lab Results  Component Value Date   WBC 13.8 (H) 02/08/2019   HGB 11.3 (L) 02/08/2019   HCT 35.0 (L) 02/08/2019   MCV 89.3 02/08/2019   PLT 215 02/08/2019   NEUTROABS 4.5 03/27/2015    ASSESSMENT & PLAN:  Malignant neoplasm of upper-outer quadrant of left breast in female, estrogen receptor positive ([place]) Right lumpectomy 11/29/2014: LCIS with fibrocystic changes with usual ductal hyperplasia,PASH, 0.8 cm margins for LCIS, took Tamoxifen from 02/07/15-12/24/18  12/08/18:Left breast 2 cm mass at 7 o clock position: ILC grade 2, ER 90%, PR 30%, Her 2 neg, Ki 67: 15% T1CN0 stage 1A  Anxiety:on Xanax  Treatment Plan:  1. Bilateral mastectomies (patient preference): 02/07/2019 Bilateral mastectomies with reconstruction ([facility], [place]) Right breast: atypical lobular hyperplasia and no  evidence of carcinoma in 1 lymph node. Left breast: invasive lobular carcinoma with LCIS, grade 2, 2.1cm, 2 lymph nodes negative for carcinoma, and invasive carcinoma broadly present at the anterior margin (margin cleared 02/24/2019).  Stage IIa 2. Oncotype Dx: Score 24, distant recurrence at 9 years 10% (did not require chemo) 3. Adj Anastrozole started 03/01/2019 -------------------------------------------------------------------------------------------------------------------------------------------------- Anastrozole toxicities: Severe joint aches and pains.  These appear to be getting worse making it difficult for her to function at home. We will discontinue anastrozole and send a prescription for letrozole.  She will start letrozole in 2 weeks.  Breast cancer surveillance: 1.  Since she had bilateral mastectomies, no role of imaging 2.  Breast examination: 09/12/2019: Benign  I will see her for a MyChart virtual visit in 2 months to review the side effects of letrozole therapy.  No  orders of the defined types were placed in this encounter.  The patient has a good understanding of the overall plan. she agrees with it. she will call with any problems that may develop before the next visit here.  Total time spent: 30 mins including face to face time and time spent for planning, charting and coordination of care  Nicholas Lose, MD 09/12/2019  I, Cloyde Reams Dorshimer, am acting as scribe for Dr. Nicholas Lose.  I have reviewed the above documentation for accuracy and completeness, and I agree with the above.

## 2019-09-12 ENCOUNTER — Other Ambulatory Visit: Payer: Self-pay

## 2019-09-12 ENCOUNTER — Telehealth: Payer: Self-pay | Admitting: Hematology and Oncology

## 2019-09-12 ENCOUNTER — Inpatient Hospital Stay: Payer: BC Managed Care – PPO | Attending: Adult Health | Admitting: Hematology and Oncology

## 2019-09-12 DIAGNOSIS — Z86000 Personal history of in-situ neoplasm of breast: Secondary | ICD-10-CM | POA: Insufficient documentation

## 2019-09-12 DIAGNOSIS — Z9013 Acquired absence of bilateral breasts and nipples: Secondary | ICD-10-CM | POA: Insufficient documentation

## 2019-09-12 DIAGNOSIS — F419 Anxiety disorder, unspecified: Secondary | ICD-10-CM | POA: Diagnosis not present

## 2019-09-12 DIAGNOSIS — C50412 Malignant neoplasm of upper-outer quadrant of left female breast: Secondary | ICD-10-CM | POA: Diagnosis present

## 2019-09-12 DIAGNOSIS — Z170 Estrogen receptor positive status [ER+]: Secondary | ICD-10-CM

## 2019-09-12 MED ORDER — LETROZOLE 2.5 MG PO TABS: 2.5000 mg | ORAL_TABLET | Freq: Every day | ORAL | 3 refills | Status: DC

## 2019-09-12 NOTE — Telephone Encounter (Signed)
I talk with patient regarding video visit °

## 2019-09-12 NOTE — Assessment & Plan Note (Signed)
Right lumpectomy 11/29/2014: LCIS with fibrocystic changes with usual ductal hyperplasia,PASH, 0.8 cm margins for LCIS, took Tamoxifen from 02/07/15-12/24/18  12/08/18:Left breast 2 cm mass at 7 o clock position: ILC grade 2, ER 90%, PR 30%, Her 2 neg, Ki 67: 15% T1CN0 stage 1A  Anxiety:on Xanax  Treatment Plan:  1. Bilateral mastectomies (patient preference): 02/07/2019 Bilateral mastectomies with reconstruction (Ts[facility]i, [place]) Right breast: atypical lobular hyperplasia and no evidence of carcinoma in 1 lymph node. Left breast: invasive lobular carcinoma with LCIS, grade 2, 2.1cm, 2 lymph nodes negative for carcinoma, and invasive carcinoma broadly present at the anterior margin (margin cleared 02/24/2019).  Stage IIa 2. Oncotype Dx: Score 24, distant recurrence at 9 years 10% (did not require chemo) 3. Adj Anastrozole started 03/01/2019 -------------------------------------------------------------------------------------------------------------------------------------------------- Anastrozole toxicities:  Breast cancer surveillance: 1.  Since she had bilateral mastectomies, no role of imaging 2.  Breast examination: 09/12/2019: Benign  Return to clinic in 1 year for follow-up

## 2019-11-13 NOTE — Progress Notes (Signed)
HEMATOLOGY-ONCOLOGY [facility] VIDEO VISIT PROGRESS NOTE  I connected with Christine Francis on 11/14/2019 at 10:15 AM EDT by MyChart video conference and verified that I am speaking with the correct person using two identifiers.  I discussed the limitations, risks, security and privacy concerns of performing an evaluation and management service by MyChart and the availability of in person appointments.  I also discussed with the patient that there may be a patient responsible charge related to this service. The patient expressed understanding and agreed to proceed.  Patient's Location: Home Physician Location: Clinic  CHIEF COMPLIANT: Follow-up of recurrent breast cancer on letrozole  INTERVAL HISTORY: Christine Francis is a 59 y.o. female with above-mentioned history of right breast cancer with recent recurrence in the left breast. She underwent bilateral mastectomy with reconstruction followed by an excision of the anterior margin and could not tolerate anastrozole so she was switched to letrozole. She presents to the clinic today for follow-up.  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 Doing much better with reg to letrozole rel body stiffness.  Oncology History  Malignant neoplasm of upper-outer quadrant of left breast in female, estrogen receptor positive ([place])  07/19/2014 Initial Biopsy   Rt.Breast Biopsy: LCIS   11/29/2014 Surgery   Right lumpectomy: LCIS with fibrocystic changes with usual ductal hyperplasia,PASH, 0.8 cm margins for LCIS   02/06/2015 -  Anti-estrogen oral therapy   tamoxifen 20 mg daily   12/08/2018 Relapse/Recurrence   Left breast 2 cm mass at 7 o clock position: ILC grade 2, ER 90%, PR 30%, Her 2 neg, Ki 67: 15% T1CN0 stage 1A   02/07/2019 Surgery   Bilateral mastectomies with reconstruction ([place]) Right breast: atypical lobular hyperplasia and no evidence of carcinoma in 1 lymph  node. Left breast: invasive lobular carcinoma with LCIS, grade 2, 2.1cm, 2 lymph nodes negative for carcinoma, and invasive carcinoma broadly present at the anterior margin.    02/07/2019 Cancer Staging   Staging form: Breast, AJCC 8th Edition - Pathologic stage from 02/07/2019: Stage IA (pT2, pN0, cM0, G2, ER+, PR+, HER2-, Oncotype DX score: 24) - Signed by Gardenia Phlegm, NP on 03/02/2019   02/07/2019 Oncotype testing   24/10%; no benefit from chemo   02/2019 -  Anti-estrogen oral therapy   Anastrozole daily    Observations/Objective:  There were no vitals filed for this visit. There is no height or weight on file to calculate BMI.  I have reviewed the data as listed CMP Latest Ref Rng & Units 05/20/2019 02/08/2019 02/03/2019  Glucose 70 - 99 mg/dL 108(H) 131(H) 105(H)  BUN 6 - 20 mg/dL 13 25(H) 15  Creatinine 0.44 - 1.00 mg/dL 0.70 1.19(H) 0.78  Sodium 135 - 145 mmol/L 138 137 140  Potassium 3.5 - 5.1 mmol/L 4.0 3.5 3.2(L)  Chloride 98 - 111 mmol/L 102 103 102  CO2 22 - 32 mmol/L _0 Calcium 8.9 - 10.3 mg/dL 9.9 8.6(L) 9.4  Total Protein 6.4 - 8.3 g/dL - - -  Total Bilirubin 0.20 - 1.20 mg/dL - - -  Alkaline Phos 40 - 150 U/L - - -  AST 5 - 34 U/L - - -  ALT 0 - 55 U/L - - -    Lab Results  Component Value Date   WBC 13.8 (H) 02/08/2019   HGB 11.3 (L) 02/08/2019   HCT 35.0 (L) 02/08/2019   MCV 89.3 02/08/2019  PLT 215 02/08/2019   NEUTROABS 4.5 03/27/2015      Assessment Plan:  Malignant neoplasm of upper-outer quadrant of left breast in female, estrogen receptor positive ([place]) Right lumpectomy 11/29/2014: LCIS with fibrocystic changes with usual ductal hyperplasia,PASH, 0.8 cm margins for LCIS, took Tamoxifen from 02/07/15-12/24/18  12/08/18:Left breast 2 cm mass at 7 o clock position: ILC grade 2, ER 90%, PR 30%, Her 2 neg, Ki 67: 15% T1CN0 stage 1A  Anxiety:onXanax  Treatment Plan:  1. Bilateral mastectomies (patient preference):  7/27/2020Bilateral mastectomies with reconstruction ([place], [place]) Right breast: atypical lobular hyperplasia and no evidence of carcinoma in 1 lymph node. Left breast: invasive lobular carcinoma with LCIS, grade 2, 2.1cm, 2 lymph nodes negative for carcinoma, and invasive carcinoma broadly present at the anterior margin (margin cleared 02/24/2019). Stage IIa 2. Oncotype Dx: Score 24, distant recurrence at 9 years 10% (did not require chemo) 3. Adj Anastrozole started 03/01/2019 -------------------------------------------------------------------------------------------------------------------------------------------------- Anastrozole toxicities: Severe joint aches and pains and severe fatigue.  Discontinued anastrozole and and started on letrozole 09/26/2019.  Letrozole toxicities: 1. Fatigue: much improved. 2. Joint pains: much better  Breast cancer surveillance: 1.  Since she had bilateral mastectomies, no role of imaging 2.  Breast examination: 09/12/2019: Benign  Plastic surgery for reconstruction (implants) coming up.  Return to clinic in 1 year for follow-up  I discussed the assessment and treatment plan with the patient. The patient was provided an opportunity to ask questions and all were answered. The patient agreed with the plan and demonstrated an understanding of the instructions. The patient was advised to call back or seek an in-person evaluation if the symptoms worsen or if the condition fails to improve as anticipated.   I provided 20 minutes of face-to-face MyChart video visit time during this encounter.    Rulon Eisenmenger, MD 11/14/2019   I, Molly Dorshimer, am acting as scribe for Nicholas Lose, MD.  I have reviewed the above documentation for accuracy and completeness, and I agree with the above.

## 2019-11-14 ENCOUNTER — Inpatient Hospital Stay: Payer: BC Managed Care – PPO | Attending: Adult Health | Admitting: Hematology and Oncology

## 2019-11-14 DIAGNOSIS — Z170 Estrogen receptor positive status [ER+]: Secondary | ICD-10-CM | POA: Diagnosis not present

## 2019-11-14 DIAGNOSIS — C50412 Malignant neoplasm of upper-outer quadrant of left female breast: Secondary | ICD-10-CM | POA: Diagnosis not present

## 2019-11-14 NOTE — Assessment & Plan Note (Signed)
Right lumpectomy 11/29/2014: LCIS with fibrocystic changes with usual ductal hyperplasia,PASH, 0.8 cm margins for LCIS, took Tamoxifen from 02/07/15-12/24/18  12/08/18:Left breast 2 cm mass at 7 o clock position: ILC grade 2, ER 90%, PR 30%, Her 2 neg, Ki 67: 15% T1CN0 stage 1A  Anxiety:onXanax  Treatment Plan:  1. Bilateral mastectomies (patient preference): 7/27/2020Bilateral mastectomies with reconstruction (Ts[place], [place]) Right breast: atypical lobular hyperplasia and no evidence of carcinoma in 1 lymph node. Left breast: invasive lobular carcinoma with LCIS, grade 2, 2.1cm, 2 lymph nodes negative for carcinoma, and invasive carcinoma broadly present at the anterior margin (margin cleared 02/24/2019). Stage IIa 2. Oncotype Dx: Score 24, distant recurrence at 9 years 10% (did not require chemo) 3. Adj Anastrozole started 03/01/2019 -------------------------------------------------------------------------------------------------------------------------------------------------- Anastrozole toxicities: Severe joint aches and pains.  Discontinued anastrozole and and started on letrozole 09/26/2019.  Letrozole toxicities:  Breast cancer surveillance: 1.  Since she had bilateral mastectomies, no role of imaging 2.  Breast examination: 09/12/2019: Benign  Return to clinic in 1 year for follow-up

## 2020-01-12 NOTE — H&P (Signed)
Subjective:    Patient ID: Christine Francis is a 59 y.o. female.  HPI  7 months post op replacement left TE. Plan implant exchange this month; patient has held off doing this as she did not want to take more time off work.   First seen in consultation 2016. At that time, screening MMG revealed abnormalities that led to a biopsy with LCIS. She underwent lumpectomy at that time and has been on tamoxifen. Diagnostic MMG  11/2018 showed a 2 cm mass in the left breast. US showed the mass to be 2.5 cm at 5 o clock 5 cmfn, one small axillary node noted. Biopsy demonstrated invasive mammary carcinoma and invasive mammary carcinoma in situ, ER/PR+ HER2 -.   Patient has had prior lumpectomy 11/2009 for ADH. Prior fibroadenoma excision as well.   Final pathology 2.1 cm ILC with LCIS, invasive carcinoma broadly present at the anterior margin. Patient underwent removal left TE/ADM and re excision mastectomy flap left which represented the buried dermal pedicle of SRM. Final pathology with ALH, no invasive component. Oncotype 24/10%. On letrozole.  Genetics negative.  Patient has had difficulty in completing breast MRI due to claustrophobia.   Prior 62 D, desires to be smaller. 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 Sister lives in [place], she is OB RN with [employer].       Objective:  Physical Exam  Cardiovascular: Normal rate, regular rhythm and normal heart sounds.  Pulmonary/Chest: Effort normal and breath sounds normal.  Abdominal: Soft.   Chest: bilateral chest expanded,  right expander lower on chest/more pseudoptosis with 1-2 cm lateral movement expander in supine position.  Posterior tabs of expander readily palpable over right- suspect AP flip of expander on right CW 15 cm No masses  Assessment:    Hx LCIS right breast s/p lumpectomy Left breast invasive cancer LOQ, ER+ S/p bilateral SRM, left SLN, prepectoral  TE/ADM (Alloderm) reconstruction S/p reexcision left mastectomy flap, removal left TE/ADM S/p placement left prepectoral TE/ADM (Alloderm)    Plan:    Plan implant exchange. Reviewed saline vs silicone, shaped vs round. HCG or capacity filled silicone implantsmay offerreducedrisk visible rippling. Reviewed MRI or USsurveillance for rupture with silicone implants. She had problems with MRI in past- reviewed this is gold standard for diagnosing rupture silicone implants but current FDA recommendations are for [place] or MRI. She has elected for silicone, plan smooth round capacity filled.  Previously discussed fat grafting - as she has no rippling presently and minimal contour depressions, plan to defer this.  Plan liposuction to lateral chest wall to aid with contour.   Plan OP surgery, no drains anticipated, need for compression.  Completed ASPS consent for implant placement following tissue expansion including physician/patient checklist.  Rx for Robaxin, cipro and Norco given.  Natrelle [id number] 500 ml tissue expanders placed bilateral,  RIGHT fill volume 480 ml saline  LEFT 310 ml total fill volume saline

## 2020-01-23 ENCOUNTER — Other Ambulatory Visit: Payer: Self-pay

## 2020-01-23 ENCOUNTER — Encounter (HOSPITAL_BASED_OUTPATIENT_CLINIC_OR_DEPARTMENT_OTHER): Payer: Self-pay | Admitting: Plastic Surgery

## 2020-01-27 ENCOUNTER — Encounter (HOSPITAL_BASED_OUTPATIENT_CLINIC_OR_DEPARTMENT_OTHER)
Admission: RE | Admit: 2020-01-27 | Discharge: 2020-01-27 | Disposition: A | Discharge status: Home / Self Care | Payer: BC Managed Care – PPO | Source: Ambulatory Visit | Attending: Plastic Surgery | Admitting: Plastic Surgery

## 2020-01-27 ENCOUNTER — Other Ambulatory Visit (HOSPITAL_COMMUNITY)
Admission: RE | Admit: 2020-01-27 | Discharge: 2020-01-27 | Disposition: A | Discharge status: Home / Self Care | Payer: BC Managed Care – PPO | Source: Ambulatory Visit | Attending: Plastic Surgery | Admitting: Plastic Surgery

## 2020-01-27 DIAGNOSIS — Z20822 Contact with and (suspected) exposure to covid-19: Secondary | ICD-10-CM | POA: Diagnosis not present

## 2020-01-27 DIAGNOSIS — Z01818 Encounter for other preprocedural examination: Secondary | ICD-10-CM | POA: Diagnosis not present

## 2020-01-27 LAB — BASIC METABOLIC PANEL
Anion gap: 15 (ref 5–15)
BUN: 15 mg/dL (ref 8–23)
CO2: 19 mmol/L — ABNORMAL LOW (ref 22–32)
Calcium: 9.7 mg/dL (ref 8.9–10.3)
Chloride: 104 mmol/L (ref 98–111)
Creatinine, Ser: 0.7 mg/dL (ref 0.44–1.00)
GFR calc Af Amer: >60 mL/min (ref >60)
GFR calc non Af Amer: >60 mL/min (ref >60)
Glucose, Bld: 85 mg/dL (ref 70–99)
Potassium: 3.8 mmol/L (ref 3.5–5.1)
Sodium: 138 mmol/L (ref 135–145)

## 2020-01-27 LAB — SARS CORONAVIRUS 2 (TAT 6-24 HRS): SARS Coronavirus 2: NEGATIVE

## 2020-01-27 NOTE — Progress Notes (Signed)

## 2020-01-31 ENCOUNTER — Encounter (HOSPITAL_BASED_OUTPATIENT_CLINIC_OR_DEPARTMENT_OTHER): Payer: Self-pay | Admitting: Plastic Surgery

## 2020-01-31 ENCOUNTER — Ambulatory Visit (HOSPITAL_BASED_OUTPATIENT_CLINIC_OR_DEPARTMENT_OTHER)
Admission: RE | Admit: 2020-01-31 | Discharge: 2020-01-31 | Disposition: A | Discharge status: Home / Self Care | Payer: BC Managed Care – PPO | Attending: Plastic Surgery | Admitting: Plastic Surgery

## 2020-01-31 ENCOUNTER — Other Ambulatory Visit: Payer: Self-pay

## 2020-01-31 ENCOUNTER — Encounter (HOSPITAL_BASED_OUTPATIENT_CLINIC_OR_DEPARTMENT_OTHER)
Admission: RE | Disposition: A | Discharge status: Home / Self Care | Payer: Self-pay | Source: Home / Self Care | Attending: Plastic Surgery

## 2020-01-31 ENCOUNTER — Ambulatory Visit (HOSPITAL_BASED_OUTPATIENT_CLINIC_OR_DEPARTMENT_OTHER): Payer: BC Managed Care – PPO | Admitting: Anesthesiology

## 2020-01-31 DIAGNOSIS — I10 Essential (primary) hypertension: Secondary | ICD-10-CM | POA: Diagnosis not present

## 2020-01-31 DIAGNOSIS — Z79810 Long term (current) use of selective estrogen receptor modulators (SERMs): Secondary | ICD-10-CM | POA: Insufficient documentation

## 2020-01-31 DIAGNOSIS — Z853 Personal history of malignant neoplasm of breast: Secondary | ICD-10-CM | POA: Insufficient documentation

## 2020-01-31 DIAGNOSIS — Z45812 Encounter for adjustment or removal of left breast implant: Secondary | ICD-10-CM | POA: Diagnosis present

## 2020-01-31 DIAGNOSIS — Z45811 Encounter for adjustment or removal of right breast implant: Secondary | ICD-10-CM | POA: Diagnosis not present

## 2020-01-31 HISTORY — PX: REMOVAL OF BILATERAL TISSUE EXPANDERS WITH PLACEMENT OF BILATERAL BREAST IMPLANTS: SHX6431

## 2020-01-31 HISTORY — PX: LIPOSUCTION: SHX10

## 2020-01-31 SURGERY — REMOVAL, TISSUE EXPANDER, BREAST, BILATERAL, WITH BILATERAL IMPLANT IMPLANT INSERTION
Anesthesia: General | Site: Breast | Laterality: Bilateral

## 2020-01-31 MED ORDER — DEXAMETHASONE SODIUM PHOSPHATE 10 MG/ML IJ SOLN
INTRAMUSCULAR | Status: AC
  Filled 2020-01-31: qty 2

## 2020-01-31 MED ORDER — ACETAMINOPHEN 500 MG PO TABS
ORAL_TABLET | ORAL | Status: AC
  Filled 2020-01-31: qty 2

## 2020-01-31 MED ORDER — ROCURONIUM BROMIDE 100 MG/10ML IV SOLN
INTRAVENOUS | Status: DC | PRN
Start: 2020-01-31 — End: 2020-01-31
  Administered 2020-01-31: 60 mg via INTRAVENOUS
  Administered 2020-01-31: 20 mg via INTRAVENOUS
  Administered 2020-01-31: 10 mg via INTRAVENOUS

## 2020-01-31 MED ORDER — VANCOMYCIN HCL IN DEXTROSE 1-5 GM/200ML-% IV SOLN
1000.0000 mg | INTRAVENOUS | Status: AC
  Administered 2020-01-31: 1000 mg via INTRAVENOUS

## 2020-01-31 MED ORDER — CHLORHEXIDINE GLUCONATE CLOTH 2 % EX PADS: 6.0000 | MEDICATED_PAD | Freq: Once | CUTANEOUS | Status: DC

## 2020-01-31 MED ORDER — SODIUM BICARBONATE 4 % IV SOLN
INTRAVENOUS | Status: DC | PRN
  Administered 2020-01-31: 200 mL via INTRAMUSCULAR

## 2020-01-31 MED ORDER — SODIUM CHLORIDE 0.9 % IV SOLN
INTRAVENOUS | Status: DC | PRN
  Administered 2020-01-31: 1000 mL

## 2020-01-31 MED ORDER — SODIUM CHLORIDE 0.9 % IV SOLN
INTRAVENOUS | Status: AC
  Filled 2020-01-31: qty 500000

## 2020-01-31 MED ORDER — FENTANYL CITRATE (PF) 100 MCG/2ML IJ SOLN
INTRAMUSCULAR | Status: DC | PRN
  Administered 2020-01-31 (×2): 50 ug via INTRAVENOUS
  Administered 2020-01-31: 100 ug via INTRAVENOUS

## 2020-01-31 MED ORDER — ONDANSETRON HCL 4 MG/2ML IJ SOLN
INTRAMUSCULAR | Status: AC
  Filled 2020-01-31: qty 2

## 2020-01-31 MED ORDER — FENTANYL CITRATE (PF) 100 MCG/2ML IJ SOLN
INTRAMUSCULAR | Status: AC
  Filled 2020-01-31: qty 2

## 2020-01-31 MED ORDER — PROPOFOL 10 MG/ML IV BOLUS: INTRAVENOUS | Status: DC | PRN

## 2020-01-31 MED ORDER — PHENYLEPHRINE 40 MCG/ML (10ML) SYRINGE FOR IV PUSH (FOR BLOOD PRESSURE SUPPORT)
PREFILLED_SYRINGE | INTRAVENOUS | Status: AC
  Filled 2020-01-31: qty 10

## 2020-01-31 MED ORDER — LIDOCAINE HCL (CARDIAC) PF 100 MG/5ML IV SOSY
PREFILLED_SYRINGE | INTRAVENOUS | Status: DC | PRN
  Administered 2020-01-31: 80 mg via INTRAVENOUS

## 2020-01-31 MED ORDER — LIDOCAINE 2% (20 MG/ML) 5 ML SYRINGE
INTRAMUSCULAR | Status: AC
  Filled 2020-01-31: qty 5

## 2020-01-31 MED ORDER — CELECOXIB 200 MG PO CAPS
ORAL_CAPSULE | ORAL | Status: AC
  Filled 2020-01-31: qty 1

## 2020-01-31 MED ORDER — SCOPOLAMINE 1 MG/3DAYS TD PT72
MEDICATED_PATCH | TRANSDERMAL | Status: AC
  Filled 2020-01-31: qty 1

## 2020-01-31 MED ORDER — EPHEDRINE 5 MG/ML INJ
INTRAVENOUS | Status: AC
  Filled 2020-01-31: qty 10

## 2020-01-31 MED ORDER — DIPHENHYDRAMINE HCL 50 MG/ML IJ SOLN
INTRAMUSCULAR | Status: AC
  Filled 2020-01-31: qty 1

## 2020-01-31 MED ORDER — CELECOXIB 200 MG PO CAPS
200.0000 mg | ORAL_CAPSULE | ORAL | Status: AC
  Administered 2020-01-31: 200 mg via ORAL

## 2020-01-31 MED ORDER — LACTATED RINGERS IV SOLN: INTRAVENOUS | Status: DC

## 2020-01-31 MED ORDER — VANCOMYCIN HCL IN DEXTROSE 1-5 GM/200ML-% IV SOLN
INTRAVENOUS | Status: AC
  Filled 2020-01-31: qty 200

## 2020-01-31 MED ORDER — MIDAZOLAM HCL 5 MG/5ML IJ SOLN
INTRAMUSCULAR | Status: DC | PRN
  Administered 2020-01-31: 2 mg via INTRAVENOUS

## 2020-01-31 MED ORDER — PROPOFOL 10 MG/ML IV BOLUS
INTRAVENOUS | Status: DC | PRN
  Administered 2020-01-31: 150 mg via INTRAVENOUS

## 2020-01-31 MED ORDER — POVIDONE-IODINE 10 % EX SOLN
CUTANEOUS | Status: DC | PRN
  Administered 2020-01-31: 1 via TOPICAL

## 2020-01-31 MED ORDER — SUGAMMADEX SODIUM 200 MG/2ML IV SOLN
INTRAVENOUS | Status: DC | PRN
  Administered 2020-01-31: 200 mg via INTRAVENOUS

## 2020-01-31 MED ORDER — SCOPOLAMINE 1 MG/3DAYS TD PT72
1.0000 | MEDICATED_PATCH | TRANSDERMAL | Status: DC
  Administered 2020-01-31: 1.5 mg via TRANSDERMAL

## 2020-01-31 MED ORDER — GABAPENTIN 300 MG PO CAPS
300.0000 mg | ORAL_CAPSULE | ORAL | Status: AC
  Administered 2020-01-31: 300 mg via ORAL

## 2020-01-31 MED ORDER — MIDAZOLAM HCL 2 MG/2ML IJ SOLN
INTRAMUSCULAR | Status: AC
  Filled 2020-01-31: qty 2

## 2020-01-31 MED ORDER — DIPHENHYDRAMINE HCL 50 MG/ML IJ SOLN
INTRAMUSCULAR | Status: DC | PRN
Start: 2020-01-31 — End: 2020-01-31
  Administered 2020-01-31: 6.25 mg via INTRAVENOUS

## 2020-01-31 MED ORDER — SODIUM BICARBONATE 4.2 % IV SOLN
INTRAVENOUS | Status: AC
  Filled 2020-01-31: qty 20

## 2020-01-31 MED ORDER — EPINEPHRINE PF 1 MG/ML IJ SOLN
INTRAMUSCULAR | Status: AC
  Filled 2020-01-31: qty 1

## 2020-01-31 MED ORDER — SUGAMMADEX SODIUM 200 MG/2ML IV SOLN
INTRAVENOUS | Status: DC | PRN
Start: 2020-01-31 — End: 2020-01-31

## 2020-01-31 MED ORDER — PROMETHAZINE HCL 25 MG/ML IJ SOLN: 6.2500 mg | INTRAMUSCULAR | Status: DC | PRN

## 2020-01-31 MED ORDER — ACETAMINOPHEN 500 MG PO TABS
1000.0000 mg | ORAL_TABLET | Freq: Once | ORAL | Status: AC
  Administered 2020-01-31: 1000 mg via ORAL

## 2020-01-31 MED ORDER — FENTANYL CITRATE (PF) 100 MCG/2ML IJ SOLN
25.0000 ug | INTRAMUSCULAR | Status: DC | PRN
  Administered 2020-01-31: 50 ug via INTRAVENOUS

## 2020-01-31 MED ORDER — BUPIVACAINE HCL (PF) 0.5 % IJ SOLN
INTRAMUSCULAR | Status: AC
  Filled 2020-01-31: qty 30

## 2020-01-31 MED ORDER — LIDOCAINE HCL (PF) 1 % IJ SOLN
INTRAMUSCULAR | Status: AC
  Filled 2020-01-31: qty 60

## 2020-01-31 MED ORDER — DEXAMETHASONE SODIUM PHOSPHATE 4 MG/ML IJ SOLN
INTRAMUSCULAR | Status: DC | PRN
  Administered 2020-01-31: 10 mg via INTRAVENOUS

## 2020-01-31 MED ORDER — SUCCINYLCHOLINE CHLORIDE 200 MG/10ML IV SOSY
PREFILLED_SYRINGE | INTRAVENOUS | Status: AC
  Filled 2020-01-31: qty 10

## 2020-01-31 MED ORDER — ROCURONIUM BROMIDE 10 MG/ML (PF) SYRINGE
PREFILLED_SYRINGE | INTRAVENOUS | Status: AC
  Filled 2020-01-31: qty 10

## 2020-01-31 MED ORDER — GABAPENTIN 300 MG PO CAPS
ORAL_CAPSULE | ORAL | Status: AC
  Filled 2020-01-31: qty 1

## 2020-01-31 SURGICAL SUPPLY — 101 items
ADH SKN CLS APL DERMABOND .7 (GAUZE/BANDAGES/DRESSINGS) ×2
APL PRP STRL LF DISP 70% ISPRP (MISCELLANEOUS) ×2
BAG DECANTER FOR FLEXI CONT (MISCELLANEOUS) ×4 IMPLANT
BINDER ABDOMINAL 10 UNV 27-48 (MISCELLANEOUS) IMPLANT
BINDER ABDOMINAL 12 SM 30-45 (SOFTGOODS) IMPLANT
BINDER BREAST 3XL (GAUZE/BANDAGES/DRESSINGS) IMPLANT
BINDER BREAST LRG (GAUZE/BANDAGES/DRESSINGS) IMPLANT
BINDER BREAST MEDIUM (GAUZE/BANDAGES/DRESSINGS) IMPLANT
BINDER BREAST XLRG (GAUZE/BANDAGES/DRESSINGS) IMPLANT
BINDER BREAST XXLRG (GAUZE/BANDAGES/DRESSINGS) ×2 IMPLANT
BLADE SURG 10 STRL SS (BLADE) ×4 IMPLANT
BLADE SURG 11 STRL SS (BLADE) ×2 IMPLANT
BNDG GAUZE ELAST 4 BULKY (GAUZE/BANDAGES/DRESSINGS) ×4 IMPLANT
CANISTER LIPO FAT HARVEST (MISCELLANEOUS) IMPLANT
CANISTER SUCT 1200ML W/VALVE (MISCELLANEOUS) ×2 IMPLANT
CHLORAPREP W/TINT 26 (MISCELLANEOUS) ×4 IMPLANT
COVER BACK TABLE 60X90IN (DRAPES) ×2 IMPLANT
COVER MAYO STAND STRL (DRAPES) ×2 IMPLANT
COVER SURGICAL LIGHT HANDLE (MISCELLANEOUS) ×2 IMPLANT
COVER WAND RF STERILE (DRAPES) IMPLANT
DECANTER SPIKE VIAL GLASS SM (MISCELLANEOUS) IMPLANT
DERMABOND ADVANCED (GAUZE/BANDAGES/DRESSINGS) ×2
DERMABOND ADVANCED .7 DNX12 (GAUZE/BANDAGES/DRESSINGS) ×2 IMPLANT
DRAIN CHANNEL 15F RND FF W/TCR (WOUND CARE) IMPLANT
DRAPE TOP ARMCOVERS (MISCELLANEOUS) ×2 IMPLANT
DRAPE U-SHAPE 76X120 STRL (DRAPES) ×2 IMPLANT
DRAPE UTILITY XL STRL (DRAPES) ×4 IMPLANT
DRSG PAD ABDOMINAL 8X10 ST (GAUZE/BANDAGES/DRESSINGS) ×4 IMPLANT
ELECT BLADE 4.0 EZ CLEAN MEGAD (MISCELLANEOUS) ×2
ELECT COATED BLADE 2.86 ST (ELECTRODE) ×2 IMPLANT
ELECT REM PT RETURN 9FT ADLT (ELECTROSURGICAL) ×2
ELECTRODE BLDE 4.0 EZ CLN MEGD (MISCELLANEOUS) ×1 IMPLANT
ELECTRODE REM PT RTRN 9FT ADLT (ELECTROSURGICAL) ×1 IMPLANT
EVACUATOR SILICONE 100CC (DRAIN) IMPLANT
EXTRACTOR CANIST REVOLVE STRL (CANNISTER) IMPLANT
FUNNEL KELLER 2 DISP (MISCELLANEOUS) ×2 IMPLANT
GLOVE BIO SURGEON STRL SZ 6 (GLOVE) ×8 IMPLANT
GLOVE BIOGEL PI IND STRL 6.5 (GLOVE) ×1 IMPLANT
GLOVE BIOGEL PI IND STRL 7.0 (GLOVE) ×2 IMPLANT
GLOVE BIOGEL PI INDICATOR 6.5 (GLOVE) ×1
GLOVE BIOGEL PI INDICATOR 7.0 (GLOVE) ×2
GLOVE ECLIPSE 6.5 STRL STRAW (GLOVE) ×2 IMPLANT
GOWN STRL REUS W/ TWL LRG LVL3 (GOWN DISPOSABLE) ×2 IMPLANT
GOWN STRL REUS W/TWL LRG LVL3 (GOWN DISPOSABLE) ×4
IMPL BREAST FULL SHL 560 (Breast) ×1 IMPLANT
IMPL BREAST P6.4XRND XFULL 615 (Breast) ×1 IMPLANT
IMPL BRST FULL SHL 560CC (Breast) ×1 IMPLANT
IMPL BRST P6.4XRND XFULL 615CC (Breast) ×1 IMPLANT
IMPLANT BREAST GEL 560CC (Breast) ×2 IMPLANT
IMPLANT BREAST GEL 615CC (Breast) ×2 IMPLANT
IV NS 500ML (IV SOLUTION)
IV NS 500ML BAXH (IV SOLUTION) IMPLANT
KIT FILL SYSTEM UNIVERSAL (SET/KITS/TRAYS/PACK) IMPLANT
LINER CANISTER 1000CC FLEX (MISCELLANEOUS) ×2 IMPLANT
MARKER SKIN DUAL TIP RULER LAB (MISCELLANEOUS) IMPLANT
NDL SAFETY ECLIPSE 18X1.5 (NEEDLE) ×3 IMPLANT
NEEDLE FILTER BLUNT 18X 1/2SAF (NEEDLE) ×1
NEEDLE FILTER BLUNT 18X1 1/2 (NEEDLE) ×1 IMPLANT
NEEDLE HYPO 18GX1.5 SHARP (NEEDLE) ×6
NEEDLE HYPO 25X1 1.5 SAFETY (NEEDLE) IMPLANT
NS IRRIG 1000ML POUR BTL (IV SOLUTION) ×2 IMPLANT
PACK BASIN DAY SURGERY FS (CUSTOM PROCEDURE TRAY) ×2 IMPLANT
PAD ALCOHOL SWAB (MISCELLANEOUS) ×12 IMPLANT
PENCIL SMOKE EVACUATOR (MISCELLANEOUS) ×2 IMPLANT
PIN SAFETY STERILE (MISCELLANEOUS) IMPLANT
PUNCH BIOPSY DERMAL 4MM (MISCELLANEOUS) IMPLANT
SHEET MEDIUM DRAPE 40X70 STRL (DRAPES) ×2 IMPLANT
SIZER BREAST REUSE 560CC (SIZER) ×2
SIZER BREAST REUSE GEL 560CC (SIZER) ×2
SIZER BREAST REUSE GEL 580CC (SIZER) ×2
SIZER BREAST REUSE XFP 615CC (SIZER) ×2
SIZER BRST REUSE GEL 560CC (SIZER) ×1 IMPLANT
SIZER BRST REUSE GEL 580CC (SIZER) ×1 IMPLANT
SIZER BRST REUSE P5.7X 560CC (SIZER) ×1 IMPLANT
SIZER BRST REUSE XFP 615CC (SIZER) ×1 IMPLANT
SLEEVE SCD COMPRESS KNEE MED (MISCELLANEOUS) ×2 IMPLANT
SPONGE LAP 18X18 RF (DISPOSABLE) ×4 IMPLANT
STAPLER VISISTAT 35W (STAPLE) ×2 IMPLANT
SUT ETHILON 2 0 FS 18 (SUTURE) IMPLANT
SUT MNCRL AB 4-0 PS2 18 (SUTURE) ×4 IMPLANT
SUT PDS AB 2-0 CT2 27 (SUTURE) ×2 IMPLANT
SUT PROLENE 2 0 CT2 30 (SUTURE) ×2 IMPLANT
SUT VIC AB 3-0 PS1 18 (SUTURE) ×2
SUT VIC AB 3-0 PS1 18XBRD (SUTURE) ×1 IMPLANT
SUT VIC AB 3-0 SH 27 (SUTURE) ×4
SUT VIC AB 3-0 SH 27X BRD (SUTURE) ×2 IMPLANT
SUT VICRYL 4-0 PS2 18IN ABS (SUTURE) ×4 IMPLANT
SYR 10ML LL (SYRINGE) ×2 IMPLANT
SYR 20ML LL LF (SYRINGE) IMPLANT
SYR 50ML LL SCALE MARK (SYRINGE) ×2 IMPLANT
SYR BULB IRRIG 60ML STRL (SYRINGE) ×4 IMPLANT
SYR CONTROL 10ML LL (SYRINGE) IMPLANT
SYR TB 1ML LL NO SAFETY (SYRINGE) ×2 IMPLANT
SYR TOOMEY IRRIG 70ML (MISCELLANEOUS)
SYRINGE TOOMEY IRRIG 70ML (MISCELLANEOUS) IMPLANT
TOWEL GREEN STERILE FF (TOWEL DISPOSABLE) ×2 IMPLANT
TUBE CONNECTING 20X1/4 (TUBING) ×4 IMPLANT
TUBING INFILTRATION IT-10001 (TUBING) ×2 IMPLANT
TUBING SET GRADUATE ASPIR 12FT (MISCELLANEOUS) ×2 IMPLANT
UNDERPAD 30X36 HEAVY ABSORB (UNDERPADS AND DIAPERS) ×4 IMPLANT
YANKAUER SUCT BULB TIP NO VENT (SUCTIONS) ×2 IMPLANT

## 2020-01-31 NOTE — Anesthesia Preprocedure Evaluation (Addendum)
Anesthesia Evaluation  Patient identified by MRN, date of birth, ID band Patient awake    Reviewed: Allergy & Precautions, H&P , NPO status , Patient's Chart, lab work & pertinent test results  History of Anesthesia Complications (+) PONV and history of anesthetic complications  Airway Mallampati: II  TM Distance: >3 FB Neck ROM: full    Dental  (+) Teeth Intact, Dental Advisory Given   Pulmonary asthma ,    breath sounds clear to auscultation       Cardiovascular hypertension, Pt. on medications and Pt. on home beta blockers  Rhythm:regular Rate:Normal     Neuro/Psych PSYCHIATRIC DISORDERS Anxiety    GI/Hepatic GERD  Medicated and Controlled,  Endo/Other    Renal/GU      Musculoskeletal   Abdominal   Peds  Hematology   Anesthesia Other Findings   Reproductive/Obstetrics Breast Ca                            Anesthesia Physical  Anesthesia Plan  ASA: II  Anesthesia Plan: General   Post-op Pain Management:    Induction: Intravenous  PONV Risk Score and Plan: 4 or greater and Ondansetron, Dexamethasone, Midazolam, Scopolamine patch - Pre-op and Treatment may vary due to age or medical condition  Airway Management Planned: LMA  Additional Equipment:   Intra-op Plan:   Post-operative Plan:   Informed Consent: I have reviewed the patients History and Physical, chart, labs and discussed the procedure including the risks, benefits and alternatives for the proposed anesthesia with the patient or authorized representative who has indicated his/her understanding and acceptance.       Plan Discussed with: CRNA, Anesthesiologist and Surgeon  Anesthesia Plan Comments:         Anesthesia Quick Evaluation

## 2020-01-31 NOTE — Anesthesia Procedure Notes (Signed)
Procedure Name: LMA Insertion Date/Time: 01/31/2020 9:21 AM Performed by: Willa Frater, CRNA Pre-anesthesia Checklist: Patient identified, Emergency Drugs available, Suction available and Patient being monitored Patient Re-evaluated:Patient Re-evaluated prior to induction Oxygen Delivery Method: Circle system utilized Preoxygenation: Pre-oxygenation with 100% oxygen Induction Type: IV induction Ventilation: Mask ventilation without difficulty LMA: LMA inserted LMA Size: 4.0 Number of attempts: 1 Airway Equipment and Method: Bite block Placement Confirmation: positive ETCO2 Tube secured with: Tape Dental Injury: Teeth and Oropharynx as per pre-operative assessment

## 2020-01-31 NOTE — Interval H&P Note (Signed)
History and Physical Interval Note:  01/31/2020 7:43 AM  Christine Francis  has presented today for surgery, with the diagnosis of history breast ca, acquired absence breasts.  The various methods of treatment have been discussed with the patient and family. After consideration of risks, benefits and other options for treatment, the patient has consented to  Procedure(s): REMOVAL OF BILATERAL TISSUE EXPANDERS WITH PLACEMENT OF BILATERAL SILICONE BREAST IMPLANTS (Bilateral) LIPOSUCTION BILATERAL LATERAL CHEST WALL (Bilateral) as a surgical intervention.  The patient's history has been reviewed, patient examined, no change in status, stable for surgery.  I have reviewed the patient's chart and labs.  Questions were answered to the patient's satisfaction.     Arnoldo Hooker Christine Francis

## 2020-01-31 NOTE — Op Note (Signed)
Operative Note   DATE OF OPERATION: 7.20.21  LOCATION: [facility]-outpatient  SURGICAL DIVISION: Plastic Surgery  PREOPERATIVE DIAGNOSES:  1. History breast cancer 2. Acquired absence bilateral breast   POSTOPERATIVE DIAGNOSES:  same  PROCEDURE:  1. Removal bilateral chest tissue expanders and placement silicone implants 2. Liposuction bilateral lateral chest  SURGEON: [name] MD MBA  ASSISTANT: none  ANESTHESIA:  General.   EBL: 25 ml  COMPLICATIONS: None immediate.   INDICATIONS FOR PROCEDURE:  The patient, Christine Francis, is a 59 y.o. female born on 06-02-1961, is here for staged breast reconstruction following prepectoral tissue expander acellular dermis reconstruction. Patient underwent immediate reconstruction following skin reduction pattern mastectomies. Over left breast, expander removed at time of re excision mastectomy flap for margins. She ultimately underwent delayed expander placement over left.   FINDINGS: Full incorporation ADM bilateral. Total lipoaspirate lateral chest wall bilateral 300 ml. Natrelle Smooth Round Inspira implants placed bilateral. RIGHT Extra Projection 615 ml REF [id number] LEFT Full Projection 560 ml REF [id number]  DESCRIPTION OF PROCEDURE:  The patient's operative site was marked with the patient in the preoperative area to mark areas of lateral chest wall for liposuction. The patient was taken to the operating room. SCDs were placed and IV antibiotics were given. The patient's operative site was prepped and draped in a sterile fashion. A time out was performed and all information was confirmed to be correct. Incision made in left inframammary fold scar and carried through superficial fascia and capsule. Expander was noted to be rotated. Expander removed. Preoperatively asymmetry noted of base of implant cavity with left higher. Capsulotomies performed circumferential and additional anterior scoring capsule  completed. Superficial fascia suture to chest wall at desired inframammary fold with interrupted 2-0 PDS suture. Sizer placed.  Over right chest transverse skin excision superior to inframammary fold scar over lower pole mastectomy flap designed as this chest demonstrated more laxity. The area was de epithelialized. Incision carried through soft tissue and capsule through central portion of this area. Expander removed. Thermal capsulorraphy performed laterally and inferiorly. Capsulotomy performed medially. Sizer placed. Patient brought to upright sitting position. Extra Projection 615 ml implant selected for right and Full Projection 560 ml implant selected for left. Patient returned to supine position  Tumescent infiltrated over bilateral lateral chest wall, total 200 ml. Over right this was completed through lateral extent inframammary fold incision. Over left, stab incision made in prior drain scar. Power assisted liposuction completed to endpoint symmetric soft tissue pinch. Total lipoaspirate 300 ml.  Patient demonstrated lateral movement right sizer. Right lateral capsulorraphy performed with interrupted 2-0 Prolene suture. Each cavity irrigated with polymyxin bacitracin solution. Hemostasis ensured. Each breast cavity then irrigated with saline Betadine solution. Implant placed in right chest cavity with aid Keller funnel. Closure completed with 3-0 vicryl to close capsule and superficial fascia, 3-0 and 4-0 vicryl used to close dermis, and 4-0 moncryl subcuticular skin closure completed. Implant then placed in left xhest cavity with aid Keller funnel. Closure completed with 3-0 vicryl to close capsule and superficial fascia, 4-0 vicryl used to close dermis, and 4-0 moncryl subcuticular skin closure completed, Tissue adhesive applied to inframammary fold incisions. Left chest stab incision closed with simple interrupted 4-0 monocryl.   Dry dressing and breast binder applied. The patient was allowed  to wake from anesthesia, extubated and taken to the recovery room in satisfactory condition.   SPECIMENS: right mastectomy flap  DRAINS: none

## 2020-01-31 NOTE — Transfer of Care (Signed)
Immediate Anesthesia Transfer of Care Note  Patient: Christine Francis  Procedure(s) Performed: REMOVAL OF BILATERAL TISSUE EXPANDERS WITH PLACEMENT OF BILATERAL SILICONE BREAST IMPLANTS (Bilateral Breast) LIPOSUCTION BILATERAL LATERAL CHEST WALL (Bilateral Breast)  Patient Location: PACU  Anesthesia Type:General  Level of Consciousness: sedated  Airway & Oxygen Therapy: Patient Spontanous Breathing and Patient connected to face mask oxygen  Post-op Assessment: Report given to RN and Post -op Vital signs reviewed and stable  Post vital signs: Reviewed and stable  Last Vitals:  Vitals Value Taken Time  BP    Temp    Pulse 98 01/31/20 1251  Resp    SpO2 97 % 01/31/20 1251  Vitals shown include unvalidated device data.  Last Pain:  Vitals:   01/31/20 0801  TempSrc: Oral  PainSc: 0-No pain         Complications: No complications documented.

## 2020-01-31 NOTE — Anesthesia Postprocedure Evaluation (Signed)
Anesthesia Post Note  Patient: Christine Francis  Procedure(s) Performed: REMOVAL OF BILATERAL TISSUE EXPANDERS WITH PLACEMENT OF BILATERAL SILICONE BREAST IMPLANTS (Bilateral Breast) LIPOSUCTION BILATERAL LATERAL CHEST WALL (Bilateral Breast)     Patient location during evaluation: PACU Anesthesia Type: General Level of consciousness: awake and alert Pain management: pain level controlled Vital Signs Assessment: post-procedure vital signs reviewed and stable Respiratory status: spontaneous breathing, nonlabored ventilation and respiratory function stable Cardiovascular status: blood pressure returned to baseline and stable Postop Assessment: no apparent nausea or vomiting Anesthetic complications: no   No complications documented.  Last Vitals:  Vitals:   01/31/20 1400 01/31/20 1445  BP: 127/88 132/90  Pulse: 92 83  Resp: 16 16  Temp:  36.7 C  SpO2: 95% 96%    Last Pain:  Vitals:   01/31/20 1445  TempSrc:   PainSc: 3                  Lidia Collum

## 2020-01-31 NOTE — Discharge Instructions (Signed)
No Tylenol until 2pm No Ibuprofen until 4pm   Post Anesthesia Home Care Instructions  Activity: Get plenty of rest for the remainder of the day. A responsible individual must stay with you for 24 hours following the procedure.  For the next 24 hours, DO NOT: -Drive a car -Paediatric nurse -Drink alcoholic beverages -Take any medication unless instructed by your physician -Make any legal decisions or sign important papers.  Meals: Start with liquid foods such as gelatin or soup. Progress to regular foods as tolerated. Avoid greasy, spicy, heavy foods. If nausea and/or vomiting occur, drink only clear liquids until the nausea and/or vomiting subsides. Call your physician if vomiting continues.  Special Instructions/Symptoms: Your throat may feel dry or sore from the anesthesia or the breathing tube placed in your throat during surgery. If this causes discomfort, gargle with warm salt water. The discomfort should disappear within 24 hours.  If you had a scopolamine patch placed behind your ear for the management of post- operative nausea and/or vomiting:  1. The medication in the patch is effective for 72 hours, after which it should be removed.  Wrap patch in a tissue and discard in the trash. Wash hands thoroughly with soap and water. 2. You may remove the patch earlier than 72 hours if you experience unpleasant side effects which may include dry mouth, dizziness or visual disturbances. 3. Avoid touching the patch. Wash your hands with soap and water after contact with the patch.

## 2020-01-31 NOTE — Anesthesia Procedure Notes (Signed)
Procedure Name: Intubation Date/Time: 01/31/2020 9:26 AM Performed by: Willa Frater, CRNA Pre-anesthesia Checklist: Patient identified, Emergency Drugs available, Suction available and Patient being monitored Patient Re-evaluated:Patient Re-evaluated prior to induction Oxygen Delivery Method: Circle system utilized Preoxygenation: Pre-oxygenation with 100% oxygen Induction Type: IV induction Ventilation: Mask ventilation without difficulty Laryngoscope Size: Mac and 3 Grade View: Grade I Tube type: Oral Number of attempts: 1 Airway Equipment and Method: Stylet and Oral airway Placement Confirmation: ETT inserted through vocal cords under direct vision,  positive ETCO2 and breath sounds checked- equal and bilateral Secured at: 22 cm Tube secured with: Tape Dental Injury: Teeth and Oropharynx as per pre-operative assessment

## 2020-02-01 ENCOUNTER — Encounter (HOSPITAL_BASED_OUTPATIENT_CLINIC_OR_DEPARTMENT_OTHER): Payer: Self-pay | Admitting: Plastic Surgery

## 2020-02-01 LAB — SURGICAL PATHOLOGY

## 2020-02-14 ENCOUNTER — Encounter (HOSPITAL_BASED_OUTPATIENT_CLINIC_OR_DEPARTMENT_OTHER): Payer: Self-pay | Admitting: Plastic Surgery

## 2020-09-15 ENCOUNTER — Other Ambulatory Visit: Payer: Self-pay | Admitting: Hematology and Oncology

## 2020-11-15 ENCOUNTER — Telehealth: Payer: Self-pay | Admitting: Hematology and Oncology

## 2020-11-15 NOTE — Telephone Encounter (Signed)
Scheduled appt per 5/4 sch msg. Pt aware.  

## 2020-11-26 NOTE — Progress Notes (Signed)
Patient Care Team: Molli Posey, MD as PCP - General (Obstetrics and Gynecology) [name], MD as Consulting Physician (Plastic Surgery) Donnie Mesa, MD as Consulting Physician (General Surgery) Nicholas Lose, MD as Consulting Physician (Hematology and Oncology)  DIAGNOSIS:    ICD-10-CM   1. Malignant neoplasm of upper-outer quadrant of left breast in female, estrogen receptor positive ([place])  C50.412    Z17.0     SUMMARY OF ONCOLOGIC HISTORY: Oncology History  Malignant neoplasm of upper-outer quadrant of left breast in female, estrogen receptor positive ([place])  07/19/2014 Initial Biopsy   Rt.Breast Biopsy: LCIS   11/29/2014 Surgery   Right lumpectomy: LCIS with fibrocystic changes with usual ductal hyperplasia,PASH, 0.8 cm margins for LCIS   02/06/2015 -  Anti-estrogen oral therapy   tamoxifen 20 mg daily   12/08/2018 Relapse/Recurrence   Left breast 2 cm mass at 7 o clock position: ILC grade 2, ER 90%, PR 30%, Her 2 neg, Ki 67: 15% T1CN0 stage 1A   02/07/2019 Surgery   Bilateral mastectomies with reconstruction ([place]) Right breast: atypical lobular hyperplasia and no evidence of carcinoma in 1 lymph node. Left breast: invasive lobular carcinoma with LCIS, grade 2, 2.1cm, 2 lymph nodes negative for carcinoma, and invasive carcinoma broadly present at the anterior margin.    02/07/2019 Cancer Staging   Staging form: Breast, AJCC 8th Edition - Pathologic stage from 02/07/2019: Stage IA (pT2, pN0, cM0, G2, ER+, PR+, HER2-, Oncotype DX score: 24) - Signed by Gardenia Phlegm, NP on 03/02/2019   02/07/2019 Oncotype testing   24/10%; no benefit from chemo   02/2019 -  Anti-estrogen oral therapy   Anastrozole daily switched to letrozole 09/26/2019     CHIEF COMPLIANT: Follow-up of recurrent breast cancer on letrozole  INTERVAL HISTORY: Christine Francis is a 60 y.o. with above-mentioned history of right breast cancer with recent recurrence in the  left breast. She underwent bilateral mastectomy with reconstructionfollowed by an excision of the anterior margin and could not tolerate anastrozole so she was switched to letrozole.She presents to the clinic todayfor follow-up. She is tolerating letrozole reasonably well.  Her major complaint is significant weight gain.  She has not been exercising and has not been following any proper diet.  She has been working from home and that has made it difficult for her to stop carbs.  ALLERGIES:  is allergic to ampicillin, dilaudid [hydromorphone hcl], erythromycin, penicillins, sulfa drugs cross reactors, and declomycin [demeclocycline].  MEDICATIONS:  Current Outpatient Medications  Medication Sig Dispense Refill  . albuterol (PROVENTIL HFA;VENTOLIN HFA) 108 (90 BASE) MCG/ACT inhaler Inhale 1-2 puffs into the lungs every 6 (six) hours as needed for wheezing or shortness of breath.     Marland Kitchen atenolol-chlorthalidone (TENORETIC) 50-25 MG per tablet Take 1 tablet by mouth daily.      . Calcium Carb-Cholecalciferol (CALCIUM 600+D3 PO) Take 2 tablets by mouth daily.    . Cholecalciferol (EQL VITAMIN D3) 50 MCG (2000 UT) CAPS Take 2,000 Units by mouth daily.    Marland Kitchen EPINEPHrine 0.3 mg/0.3 mL IJ SOAJ injection Inject 0.3 mg into the muscle as needed for anaphylaxis.    . fluticasone furoate-vilanterol (BREO ELLIPTA) 200-25 MCG/INH AEPB Inhale 1 puff into the lungs daily.    Marland Kitchen ibuprofen (ADVIL) 200 MG tablet Take 600 mg by mouth every 6 (six) hours as needed for headache or moderate pain.    Marland Kitchen letrozole (FEMARA) 2.5 MG tablet TAKE 1 TABLET BY MOUTH EVERY DAY 90 tablet 0  .  montelukast (SINGULAIR) 10 MG tablet Take 10 mg by mouth daily.     Marland Kitchen omeprazole (PRILOSEC OTC) 20 MG tablet Take 20 mg by mouth daily.    . potassium chloride SA (KLOR-CON M20) 20 MEQ tablet Take 20 mEq by mouth daily.      No current facility-administered medications for this visit.    PHYSICAL EXAMINATION: ECOG PERFORMANCE STATUS: 1 -  Symptomatic but completely ambulatory  Vitals:   11/27/20 0900  BP: (!) 121/53  Pulse: 64  Resp: 18  Temp: (!) 97.4 F (36.3 C)  SpO2: 98%   Filed Weights   11/27/20 0900  Weight: 200 lb 9.6 oz (91 kg)    BREAST: Bil mastectomies with reconstruction. (exam performed in the presence of a chaperone)  LABORATORY DATA:  I have reviewed the data as listed CMP Latest Ref Rng & Units 01/27/2020 05/20/2019 02/08/2019  Glucose 70 - 99 mg/dL 85 108(H) 131(H)  BUN 8 - 23 mg/dL 15 13 25(H)  Creatinine 0.44 - 1.00 mg/dL 0.70 0.70 1.19(H)  Sodium 135 - 145 mmol/L 138 138 137  Potassium 3.5 - 5.1 mmol/L 3.8 4.0 3.5  Chloride 98 - 111 mmol/L 104 102 103  CO2 22 - 32 mmol/L 19(L) 24 26  Calcium 8.9 - 10.3 mg/dL 9.7 9.9 8.6(L)  Total Protein 6.4 - 8.3 g/dL - - -  Total Bilirubin 0.20 - 1.20 mg/dL - - -  Alkaline Phos 40 - 150 U/L - - -  AST 5 - 34 U/L - - -  ALT 0 - 55 U/L - - -    Lab Results  Component Value Date   WBC 13.8 (H) 02/08/2019   HGB 11.3 (L) 02/08/2019   HCT 35.0 (L) 02/08/2019   MCV 89.3 02/08/2019   PLT 215 02/08/2019   NEUTROABS 4.5 03/27/2015    ASSESSMENT & PLAN:  Malignant neoplasm of upper-outer quadrant of left breast in female, estrogen receptor positive ([place]) Right lumpectomy 11/29/2014: LCIS with fibrocystic changes with usual ductal hyperplasia,PASH, 0.8 cm margins for LCIS, took Tamoxifen from 02/07/15-12/24/18  12/08/18:Left breast 2 cm mass at 7 o clock position: ILC grade 2, ER 90%, PR 30%, Her 2 neg, Ki 67: 15% T1CN0 stage 1A  Anxiety:onXanax  Treatment Plan:  1. Bilateral mastectomies (patient preference): 7/27/2020Bilateral mastectomies with reconstruction ([place]) Right breast: atypical lobular hyperplasia and no evidence of carcinoma in 1 lymph node. Left breast: invasive lobular carcinoma with LCIS, grade 2, 2.1cm, 2 lymph nodes negative for carcinoma, and invasive carcinoma broadly present at the anterior margin(margin cleared  02/24/2019). Stage IIa 2. Oncotype Dx: Score 24, distant recurrence at 9 years 10% (did not require chemo) 3. Adj Anastrozolestarted 03/01/2019 -------------------------------------------------------------------------------------------------------------------------------------------------- Anastrozole toxicities:Severe joint aches and pains and severe fatigue. Discontinued anastrozole and and started on letrozole 09/26/2019.   Letrozole toxicities: 1. Fatigue: much improved. 2. Joint pains: much better 3.  Significant weight gain issues: Patient is very concerned about her ongoing weight gain and she attributes most of this to her work-related stress and not exercising and eating too many carbs. I encouraged her to use an app called Noom to help with the weight loss. We also discussed intermittent fasting.  Breast cancer surveillance: 1.Since she had bilateral mastectomies, no role of imaging 2.Breast examination: 11/27/2020: Benign   Return to clinic in 1 year for follow-up    No orders of the defined types were placed in this encounter.  The patient has a good understanding of the overall plan. she agrees with  it. she will call with any problems that may develop before the next visit here.  Total time spent: 20 mins including face to face time and time spent for planning, charting and coordination of care  Christine Eisenmenger, MD, MPH 11/27/2020  I, Cloyde Reams Dorshimer, am acting as scribe for Dr. Nicholas Lose.  I have reviewed the above documentation for accuracy and completeness, and I agree with the above.

## 2020-11-27 ENCOUNTER — Other Ambulatory Visit: Payer: Self-pay

## 2020-11-27 ENCOUNTER — Inpatient Hospital Stay: Payer: 59 | Attending: Hematology and Oncology | Admitting: Hematology and Oncology

## 2020-11-27 ENCOUNTER — Telehealth: Payer: Self-pay | Admitting: Hematology and Oncology

## 2020-11-27 DIAGNOSIS — F419 Anxiety disorder, unspecified: Secondary | ICD-10-CM | POA: Insufficient documentation

## 2020-11-27 DIAGNOSIS — Z170 Estrogen receptor positive status [ER+]: Secondary | ICD-10-CM

## 2020-11-27 DIAGNOSIS — C50412 Malignant neoplasm of upper-outer quadrant of left female breast: Secondary | ICD-10-CM | POA: Diagnosis present

## 2020-11-27 DIAGNOSIS — Z9013 Acquired absence of bilateral breasts and nipples: Secondary | ICD-10-CM | POA: Insufficient documentation

## 2020-11-27 DIAGNOSIS — Z79811 Long term (current) use of aromatase inhibitors: Secondary | ICD-10-CM | POA: Diagnosis not present

## 2020-11-27 MED ORDER — LETROZOLE 2.5 MG PO TABS: 2.5000 mg | ORAL_TABLET | Freq: Every day | ORAL | 3 refills | Status: DC

## 2020-11-27 NOTE — Telephone Encounter (Signed)
scheduled appointment per 05/17 los. Patient is aware.

## 2020-11-27 NOTE — Assessment & Plan Note (Signed)
Right lumpectomy 11/29/2014: LCIS with fibrocystic changes with usual ductal hyperplasia,PASH, 0.8 cm margins for LCIS, took Tamoxifen from 02/07/15-12/24/18  12/08/18:Left breast 2 cm mass at 7 o clock position: ILC grade 2, ER 90%, PR 30%, Her 2 neg, Ki 67: 15% T1CN0 stage 1A  Anxiety:onXanax  Treatment Plan:  1. Bilateral mastectomies (patient preference): 7/27/2020Bilateral mastectomies with reconstruction ([place]) Right breast: atypical lobular hyperplasia and no evidence of carcinoma in 1 lymph node. Left breast: invasive lobular carcinoma with LCIS, grade 2, 2.1cm, 2 lymph nodes negative for carcinoma, and invasive carcinoma broadly present at the anterior margin(margin cleared 02/24/2019). Stage IIa 2. Oncotype Dx: Score 24, distant recurrence at 9 years 10% (did not require chemo) 3. Adj Anastrozolestarted 03/01/2019 -------------------------------------------------------------------------------------------------------------------------------------------------- Anastrozole toxicities:Severe joint aches and pains and severe fatigue. Discontinued anastrozole and and started on letrozole 09/26/2019.   Letrozole toxicities: 1. Fatigue: much improved. 2. Joint pains: much better  Breast cancer surveillance: 1.Since she had bilateral mastectomies, no role of imaging 2.Breast examination: 11/27/2020: Benign  Plastic surgery for reconstruction (implants) coming up.  Return to clinic in 1 year for follow-up

## 2021-03-20 ENCOUNTER — Encounter: Payer: Self-pay | Admitting: Internal Medicine

## 2021-03-26 ENCOUNTER — Encounter: Payer: Self-pay | Admitting: Internal Medicine

## 2021-05-20 ENCOUNTER — Ambulatory Visit (AMBULATORY_SURGERY_CENTER): Payer: 59 | Admitting: *Deleted

## 2021-05-20 ENCOUNTER — Other Ambulatory Visit: Payer: Self-pay

## 2021-05-20 VITALS — Ht 66.0 in | Wt 194.0 lb

## 2021-05-20 DIAGNOSIS — Z86010 Personal history of colonic polyps: Secondary | ICD-10-CM

## 2021-05-20 MED ORDER — NA SULFATE-K SULFATE-MG SULF 17.5-3.13-1.6 GM/177ML PO SOLN
1.0000 | Freq: Once | ORAL | 0 refills | Status: AC
Start: 2021-05-20 — End: 2021-05-20

## 2021-05-20 NOTE — Progress Notes (Signed)
No egg or soy allergy known to patient  No issues known to pt with past sedation with any surgeries or procedures Patient denies ever being told they had issues or difficulty with intubation  No FH of Malignant Hyperthermia Pt is not on diet pills Pt is not on  home 02  Pt is not on blood thinners  Pt denies issues with constipation  No A fib or A flutter  Pt is fully vaccinated  for Covid   NO PA's for preps discussed with pt In PV today  Discussed with pt there will be an out-of-pocket cost for prep and that varies from $0 to 70 +  dollars - pt verbalized understanding   Due to the COVID-19 pandemic we are asking patients to follow certain guidelines in PV and the [place]   Pt aware of COVID protocols and [facility] guidelines   PV completed over the phone. Pt verified name, DOB, address and insurance during PV today.  Pt mailed instruction packet with copy of consent form to read and not return, and instructions.   Pt encouraged to call with questions or issues.  If pt has My chart, procedure instructions sent via My Chart

## 2021-05-21 ENCOUNTER — Encounter: Payer: Self-pay | Admitting: Internal Medicine

## 2021-06-03 ENCOUNTER — Other Ambulatory Visit: Payer: Self-pay | Admitting: Internal Medicine

## 2021-06-03 ENCOUNTER — Ambulatory Visit (AMBULATORY_SURGERY_CENTER): Payer: 59 | Admitting: Internal Medicine

## 2021-06-03 ENCOUNTER — Encounter: Payer: Self-pay | Admitting: Internal Medicine

## 2021-06-03 VITALS — BP 138/71 | HR 56 | Temp 98.7°F | Resp 13 | Ht 66.0 in | Wt 194.0 lb

## 2021-06-03 DIAGNOSIS — D124 Benign neoplasm of descending colon: Secondary | ICD-10-CM | POA: Diagnosis not present

## 2021-06-03 DIAGNOSIS — D120 Benign neoplasm of cecum: Secondary | ICD-10-CM

## 2021-06-03 DIAGNOSIS — D122 Benign neoplasm of ascending colon: Secondary | ICD-10-CM

## 2021-06-03 DIAGNOSIS — Z86010 Personal history of colonic polyps: Secondary | ICD-10-CM | POA: Diagnosis not present

## 2021-06-03 DIAGNOSIS — D123 Benign neoplasm of transverse colon: Secondary | ICD-10-CM | POA: Diagnosis not present

## 2021-06-03 MED ORDER — SODIUM CHLORIDE 0.9 % IV SOLN: 500.0000 mL | Freq: Once | INTRAVENOUS | Status: DC

## 2021-06-03 NOTE — Patient Instructions (Addendum)
Today I removed 12 small polyps. I will let you know pathology results and when to have another routine colonoscopy by mail and/or My Chart.  I appreciate the opportunity to care for you. Gatha Mayer, MD, [facility]     Handout on polyps given to patient. Await pathology results. Resume previous diet and continue present medications. Repeat colonoscopy for surveillance will be determined based off of pathology results. (1 year possibly)    YOU HAD AN ENDOSCOPIC PROCEDURE TODAY AT [facility]:   Refer to the procedure report that was given to you for any specific questions about what was found during the examination.  If the procedure report does not answer your questions, please call your gastroenterologist to clarify.  If you requested that your care partner not be given the details of your procedure findings, then the procedure report has been included in a sealed envelope for you to review at your convenience later.  YOU SHOULD EXPECT: Some feelings of bloating in the abdomen. Passage of more gas than usual.  Walking can help get rid of the air that was put into your GI tract during the procedure and reduce the bloating. If you had a lower endoscopy (such as a colonoscopy or flexible sigmoidoscopy) you may notice spotting of blood in your stool or on the toilet paper. If you underwent a bowel prep for your procedure, you may not have a normal bowel movement for a few days.  Please Note:  You might notice some irritation and congestion in your nose or some drainage.  This is from the oxygen used during your procedure.  There is no need for concern and it should clear up in a day or so.  SYMPTOMS TO REPORT IMMEDIATELY:  Following lower endoscopy (colonoscopy or flexible sigmoidoscopy):  Excessive amounts of blood in the stool  Significant tenderness or worsening of abdominal pains  Swelling of the abdomen that is new, acute  Fever of 100F or higher   For urgent or  emergent issues, a gastroenterologist can be reached at any hour by calling [phone]. Do not use MyChart messaging for urgent concerns.    DIET:  We do recommend a small meal at first, but then you may proceed to your regular diet.  Drink plenty of fluids but you should avoid alcoholic beverages for 24 hours.  ACTIVITY:  You should plan to take it easy for the rest of today and you should NOT DRIVE or use heavy machinery until tomorrow (because of the sedation medicines used during the test).    FOLLOW UP: Our staff will call the number listed on your records 48-72 hours following your procedure to check on you and address any questions or concerns that you may have regarding the information given to you following your procedure. If we do not reach you, we will leave a message.  We will attempt to reach you two times.  During this call, we will ask if you have developed any symptoms of COVID 19. If you develop any symptoms (ie: fever, flu-like symptoms, shortness of breath, cough etc.) before then, please call [phone].  If you test positive for Covid 19 in the 2 weeks post procedure, please call and report this information to [place].    If any biopsies were taken you will be contacted by phone or by letter within the next 1-3 weeks.  Please call us at [phone] if you have not heard about the biopsies in 3 weeks.  SIGNATURES/CONFIDENTIALITY: You and/or your care partner have signed paperwork which will be entered into your electronic medical record.  These signatures attest to the fact that that the information above on your After Visit Summary has been reviewed and is understood.  Full responsibility of the confidentiality of this discharge information lies with you and/or your care-partner.

## 2021-06-03 NOTE — Progress Notes (Signed)
Called to room to assist during endoscopic procedure.  Patient ID and intended procedure confirmed with present staff. Received instructions for my participation in the procedure from the performing physician.  

## 2021-06-03 NOTE — Op Note (Signed)
[facility] Patient Name: Christine Francis Procedure Date: 06/03/2021 11:11 AM MRN: [id number] Endoscopist: Gatha Mayer , MD Age: 60 Referring MD:  Date of Birth: 02/28/1961 Gender: Female Account #: [id number] Procedure:                Colonoscopy Indications:              Surveillance: Personal history of adenomatous                            polyps on last colonoscopy 3 years ago, Last                            colonoscopy: 2019 Medicines:                Propofol per Anesthesia, Monitored Anesthesia Care Procedure:                Pre-Anesthesia Assessment:                           - Prior to the procedure, a History and Physical                            was performed, and patient medications and                            allergies were reviewed. The patient's tolerance of                            previous anesthesia was also reviewed. The risks                            and benefits of the procedure and the sedation                            options and risks were discussed with the patient.                            All questions were answered, and informed consent                            was obtained. Prior Anticoagulants: The patient has                            taken no previous anticoagulant or antiplatelet                            agents. ASA Grade Assessment: II - A patient with                            mild systemic disease. After reviewing the risks                            and benefits, the patient was deemed in  satisfactory condition to undergo the procedure.                           After obtaining informed consent, the colonoscope                            was passed under direct vision. Throughout the                            procedure, the patient's blood pressure, pulse, and                            oxygen saturations were monitored continuously. The                            PCF-HQ190L Colonoscope  was introduced through the                            anus and advanced to the the cecum, identified by                            appendiceal orifice and ileocecal valve. The                            colonoscopy was performed without difficulty. The                            quality of the bowel preparation was good. The                            ileocecal valve, appendiceal orifice, and rectum                            were photographed. The bowel preparation used was                            Miralax via split dose instruction. The patient                            tolerated the procedure well. Scope In: 11:15:59 AM Scope Out: 11:37:12 AM Scope Withdrawal Time: 0 hours 16 minutes 29 seconds  Total Procedure Duration: 0 hours 21 minutes 13 seconds  Findings:                 The perianal and digital rectal examinations were                            normal.                           Twelve (12) sessile polyps were found in the                            descending colon, transverse colon, ascending colon  and cecum. The polyps were diminutive in size.                            These polyps were removed with a cold snare.                            Resection and retrieval were complete. Verification                            of patient identification for the specimen was                            done. Estimated blood loss was minimal.                           The exam was otherwise without abnormality on                            direct and retroflexion views. Complications:            No immediate complications. Estimated Blood Loss:     Estimated blood loss was minimal. Impression:               - Twelve (12) diminutive polyps in the descending                            colon, in the transverse colon, in the ascending                            colon and in the cecum, removed with a cold snare.                            Resected and  retrieved.                           - The examination was otherwise normal on direct                            and retroflexion views.                           - Personal history of colonic polyps. 12 adenomas                            lifetime Recommendation:           - Patient has a contact number available for                            emergencies. The signs and symptoms of potential                            delayed complications were discussed with the                            patient.  Return to normal activities tomorrow.                            Written discharge instructions were provided to the                            patient.                           - Resume previous diet.                           - Continue present medications.                           - Repeat colonoscopy is recommended for                            surveillance. The colonoscopy date will be                            determined after pathology results from today's                            exam become available for review. Need to consider                            genetics evaluation. Gatha Mayer, MD 06/03/2021 11:47:32 AM This report has been signed electronically.

## 2021-06-03 NOTE — Progress Notes (Signed)
A and O x3. Report to RN. Tolerated MAC anesthesia well. 

## 2021-06-03 NOTE — Progress Notes (Signed)
[facility] History and Physical   Primary Care Physician:  Molli Posey, MD   Reason for Procedure:   Hx colon polyps  Plan:    colonoscopy     HPI: Christine Francis is a 60 y.o. female    11/17/2005 - 3 adenomas - one  A TV adenoma was 12 mm (max) 11/29/2012 3 diminutive right colon polyps removed - adenomas  11/24/2017 6 small polyps - max 6 mm removed adenomas = 12 lifetime  Past Medical History:  Diagnosis Date   Allergy    allergy shots weekly   Anxiety    no meds currently   Asthma    Breast cancer ([place])    left - LCIS - bilat mastec   Cancer ([place])    skin- basil cancer face, chest and left leg--   GERD (gastroesophageal reflux disease)    Hyperlipidemia    diet controlled, no meds   Hypertension    IBS (irritable bowel syndrome)    no problems currently   Personal history of colonic adenomas 11/17/2005   11/17/2005 - 3 adenomas - one  A TV adenoma was 12 mm (max)    Past Surgical History:  Procedure Laterality Date   BIOPSY BREAST  2000   right; benign   BREAST BIOPSY  2012   right breast; pre cancerous   BREAST LUMPECTOMY Right 04/1999, 11/2009   BREAST LUMPECTOMY WITH RADIOACTIVE SEED LOCALIZATION Right 11/29/2014   Procedure: BREAST LUMPECTOMY WITH RADIOACTIVE SEED LOCALIZATION;  Surgeon: Donnie Mesa, MD;  Location: [facility];  Service: General;  Laterality: Right;   BREAST RECONSTRUCTION WITH PLACEMENT OF TISSUE EXPANDER AND ALLODERM Bilateral 02/07/2019   Procedure: BILATERAL BREAST RECONSTRUCTION WITH PLACEMENT OF TISSUE EXPANDER AND ALLODERM;  Surgeon: [name], MD;  Location: [facility];  Service: Plastics;  Laterality: Bilateral;   BREAST RECONSTRUCTION WITH PLACEMENT OF TISSUE EXPANDER AND ALLODERM Left 05/24/2019   Procedure: BREAST RECONSTRUCTION WITH PLACEMENT OF TISSUE EXPANDER AND ALLODERM;  Surgeon: [name], MD;  Location: [facility];  Service: Plastics;  Laterality: Left;    BUNIONECTOMY  1986   bilateral   cervical cryosurgery  12/2013   COLONOSCOPY     x 2   DIAGNOSTIC LAPAROSCOPY     laproscopy  1998   LIPOSUCTION Bilateral 01/31/2020   Procedure: LIPOSUCTION BILATERAL LATERAL CHEST WALL;  Surgeon: [name], MD;  Location: [facility];  Service: Plastics;  Laterality: Bilateral;   MASTECTOMY W/ SENTINEL NODE BIOPSY Bilateral 02/07/2019   Procedure: BILATERAL MASTECTOMIES WITH LEFT SENTINEL LYMPH NODE BIOPSY;  Surgeon: Donnie Mesa, MD;  Location: [facility];  Service: General;  Laterality: Bilateral;  PEC BLOCK   PARATHYROIDECTOMY  2005   POLYPECTOMY     RE-EXCISION OF BREAST LUMPECTOMY Left 02/24/2019   Procedure: RE-EXCISION LEFT MASTECTOMY- LEFT DERMAL FLAP;  Surgeon: Donnie Mesa, MD;  Location: [facility];  Service: General;  Laterality: Left;   REMOVAL OF BILATERAL TISSUE EXPANDERS WITH PLACEMENT OF BILATERAL BREAST IMPLANTS Bilateral 01/31/2020   Procedure: REMOVAL OF BILATERAL TISSUE EXPANDERS WITH PLACEMENT OF BILATERAL SILICONE BREAST IMPLANTS;  Surgeon: [name], MD;  Location: [facility];  Service: Plastics;  Laterality: Bilateral;   REMOVAL OF TISSUE EXPANDER AND PLACEMENT OF IMPLANT Left 02/24/2019   Procedure: REMOVAL OF LEFT CHEST TISSUE EXPANDER WITH ALLODERM;  Surgeon: [name], MD;  Location: [facility];  Service: Plastics;  Laterality: Left;   TISSUE EXPANDER FILLING Right 02/24/2019   Procedure:  RIGHT CHEST TISSUE EXPANDER FILLING;  Surgeon: [name], MD;  Location: [facility];  Service: Plastics;  Laterality: Right;  (44ml 0.9% Normal Saline)   uterine bx     thickening of uterus was the reason for havin bx   WISDOM TOOTH EXTRACTION      Prior to Admission medications   Medication Sig Start Date End Date Taking? Authorizing Provider  albuterol (PROVENTIL HFA;VENTOLIN HFA) 108 (90 BASE) MCG/ACT inhaler Inhale 1-2 puffs into the  lungs every 6 (six) hours as needed for wheezing or shortness of breath.    Yes [provider]  atenolol-chlorthalidone (TENORETIC) 50-25 MG per tablet Take 1 tablet by mouth daily.     Yes [provider]  Calcium Carb-Cholecalciferol (CALCIUM 600+D3 PO) Take 2 tablets by mouth daily.   Yes [provider]  Cholecalciferol (EQL VITAMIN D3) 50 MCG (2000 UT) CAPS Take 2,000 Units by mouth daily.   Yes [provider]  fluticasone furoate-vilanterol (BREO ELLIPTA) 200-25 MCG/INH AEPB Inhale 1 puff into the lungs daily.   Yes [provider]  letrozole (FEMARA) 2.5 MG tablet Take 1 tablet (2.5 mg total) by mouth daily. 11/27/20  Yes Nicholas Lose, MD  montelukast (SINGULAIR) 10 MG tablet Take 10 mg by mouth daily.    Yes [provider]  omeprazole (PRILOSEC OTC) 20 MG tablet Take 20 mg by mouth daily.   Yes [provider]  potassium chloride SA (KLOR-CON) 20 MEQ tablet Take 20 mEq by mouth daily.    Yes [provider]  EPINEPHrine 0.3 mg/0.3 mL IJ SOAJ injection Inject 0.3 mg into the muscle as needed for anaphylaxis.    [provider]  fluticasone (FLONASE) 50 MCG/ACT nasal spray 1-2 spray in each nostril Patient not taking: Reported on 05/20/2021    [provider]  ibuprofen (ADVIL) 200 MG tablet Take 600 mg by mouth every 6 (six) hours as needed for headache or moderate pain. Patient not taking: Reported on 05/20/2021    [provider]  levocetirizine (XYZAL) 5 MG tablet Take 5 mg by mouth daily. Patient not taking: Reported on 05/20/2021 05/20/21   [provider]  valACYclovir (VALTREX) 1000 MG tablet valacyclovir 1 gram tablet  TAKE 1 TABLET BY MOUTH EVERY DAY Patient not taking: Reported on 05/20/2021    [provider]    Current Outpatient Medications  Medication Sig Dispense Refill   albuterol (PROVENTIL HFA;VENTOLIN HFA) 108 (90 BASE) MCG/ACT inhaler Inhale 1-2 puffs  into the lungs every 6 (six) hours as needed for wheezing or shortness of breath.      atenolol-chlorthalidone (TENORETIC) 50-25 MG per tablet Take 1 tablet by mouth daily.       Calcium Carb-Cholecalciferol (CALCIUM 600+D3 PO) Take 2 tablets by mouth daily.     Cholecalciferol (EQL VITAMIN D3) 50 MCG (2000 UT) CAPS Take 2,000 Units by mouth daily.     fluticasone furoate-vilanterol (BREO ELLIPTA) 200-25 MCG/INH AEPB Inhale 1 puff into the lungs daily.     letrozole (FEMARA) 2.5 MG tablet Take 1 tablet (2.5 mg total) by mouth daily. 90 tablet 3   montelukast (SINGULAIR) 10 MG tablet Take 10 mg by mouth daily.      omeprazole (PRILOSEC OTC) 20 MG tablet Take 20 mg by mouth daily.     potassium chloride SA (KLOR-CON) 20 MEQ tablet Take 20 mEq by mouth daily.      EPINEPHrine 0.3 mg/0.3 mL IJ SOAJ injection Inject 0.3 mg into the  muscle as needed for anaphylaxis.     fluticasone (FLONASE) 50 MCG/ACT nasal spray 1-2 spray in each nostril (Patient not taking: Reported on 05/20/2021)     ibuprofen (ADVIL) 200 MG tablet Take 600 mg by mouth every 6 (six) hours as needed for headache or moderate pain. (Patient not taking: Reported on 05/20/2021)     levocetirizine (XYZAL) 5 MG tablet Take 5 mg by mouth daily. (Patient not taking: Reported on 05/20/2021)     valACYclovir (VALTREX) 1000 MG tablet valacyclovir 1 gram tablet  TAKE 1 TABLET BY MOUTH EVERY DAY (Patient not taking: Reported on 05/20/2021)     Current Facility-Administered Medications  Medication Dose Route Frequency Provider Last Rate Last Admin   0.9 %  sodium chloride infusion  500 mL Intravenous Once Gatha Mayer, MD        Allergies as of 06/03/2021 - Review Complete 06/03/2021  Allergen Reaction Noted   Ampicillin Hives 10/28/2017   Dilaudid [hydromorphone hcl] Nausea And Vomiting 11/11/2010   Erythromycin Hives and Other (See Comments) 11/11/2010   Penicillins Hives 11/11/2010   Sulfa drugs cross reactors Hives 11/11/2010    Declomycin [demeclocycline] Rash and Other (See Comments) 02/01/2019    Family History  Problem Relation Age of Onset   Hypertension Father    Cancer Father        melanoma/skin cancer   Colon polyps Father    Colon cancer Neg Hx    Rectal cancer Neg Hx    Stomach cancer Neg Hx    Esophageal cancer Neg Hx     Social History   Socioeconomic History   Marital status: Single    Spouse name: Not on file   Number of children: Not on file   Years of education: Not on file   Highest education level: Not on file  Occupational History   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[employer]    Employer: [employer]  Tobacco Use   Smoking status: Never   Smokeless tobacco: Never  Vaping Use   Vaping Use: Never used  Substance and Sexual Activity   Alcohol use: Yes    Alcohol/week: 1.0 standard drink    Types: 1 Glasses of wine per week    Comment: social   Drug use: No   Sexual activity: Yes    Birth control/protection: Post-menopausal  Other Topics Concern   Review of Systems:  All other review of systems negative except as mentioned in the HPI.  Physical Exam: Vital signs BP 109/69   Pulse 64   Temp 98.7 F (37.1 C) (Temporal)   Ht 5\' 6"  (1.676 m)   Wt 194 lb (88 kg)   SpO2 96%   BMI 31.31 kg/m   General:   Alert,  Well-developed, well-nourished, pleasant and cooperative in NAD Lungs:  Clear throughout to auscultation.   Heart:  Regular rate and rhythm; no murmurs, clicks, rubs,  or gallops. Abdomen:  Soft, nontender and nondistended. Normal bowel sounds.   Neuro/Psych:  Alert and cooperative. Normal mood and affect. A and O x 3   @[place]  Simonne Maffucci, MD, [facility] [phone] (pager) 06/03/2021 11:02 AM@

## 2021-06-03 NOTE — Progress Notes (Signed)
Pt's states no medical or surgical changes since previsit or office visit. 

## 2021-06-04 ENCOUNTER — Other Ambulatory Visit: Payer: Self-pay | Admitting: Family Medicine

## 2021-06-04 DIAGNOSIS — C50919 Malignant neoplasm of unspecified site of unspecified female breast: Secondary | ICD-10-CM

## 2021-06-04 DIAGNOSIS — R748 Abnormal levels of other serum enzymes: Secondary | ICD-10-CM

## 2021-06-04 DIAGNOSIS — E213 Hyperparathyroidism, unspecified: Secondary | ICD-10-CM

## 2021-06-04 DIAGNOSIS — D351 Benign neoplasm of parathyroid gland: Secondary | ICD-10-CM

## 2021-06-04 NOTE — Progress Notes (Signed)
Pt w/ persistently elevated liver enzymes and calcium, with low phos and parathyroid levels. No change in medication, FLP, wt, lifestyle.

## 2021-06-04 NOTE — Addendum Note (Signed)
Addended by: Marily Memos, Ayasha Ellingsen J on: 06/04/2021 01:14 PM   Modules accepted: Orders

## 2021-06-05 ENCOUNTER — Telehealth: Payer: Self-pay | Admitting: *Deleted

## 2021-06-05 NOTE — Telephone Encounter (Signed)
  Follow up Call-  Call back number 06/03/2021  Post procedure Call Back phone  # [phone]  Permission to leave phone message Yes  Some recent data might be hidden     Patient questions:  Message left to call us if necessary.

## 2021-06-09 ENCOUNTER — Encounter: Payer: Self-pay | Admitting: Internal Medicine

## 2021-06-11 ENCOUNTER — Other Ambulatory Visit: Payer: Self-pay

## 2021-06-11 ENCOUNTER — Telehealth: Payer: Self-pay | Admitting: Genetic Counselor

## 2021-06-11 DIAGNOSIS — Z86010 Personal history of colonic polyps: Secondary | ICD-10-CM

## 2021-06-11 NOTE — Telephone Encounter (Signed)
Scheduled appt per 11/29 referral. Pt is aware of appt date and time.  

## 2021-06-12 ENCOUNTER — Ambulatory Visit
Admission: RE | Admit: 2021-06-12 | Discharge: 2021-06-12 | Disposition: A | Discharge status: Home / Self Care | Payer: 59 | Source: Ambulatory Visit | Attending: Family Medicine | Admitting: Family Medicine

## 2021-06-12 ENCOUNTER — Other Ambulatory Visit: Payer: Self-pay | Admitting: Family Medicine

## 2021-06-12 ENCOUNTER — Other Ambulatory Visit: Payer: Self-pay

## 2021-06-12 DIAGNOSIS — E213 Hyperparathyroidism, unspecified: Secondary | ICD-10-CM

## 2021-06-12 DIAGNOSIS — C50919 Malignant neoplasm of unspecified site of unspecified female breast: Secondary | ICD-10-CM

## 2021-06-12 DIAGNOSIS — R748 Abnormal levels of other serum enzymes: Secondary | ICD-10-CM

## 2021-06-12 DIAGNOSIS — D351 Benign neoplasm of parathyroid gland: Secondary | ICD-10-CM

## 2021-06-12 MED ORDER — IOPAMIDOL (ISOVUE-300) INJECTION 61%
100.0000 mL | Freq: Once | INTRAVENOUS | Status: AC | PRN
  Administered 2021-06-12: 100 mL via INTRAVENOUS

## 2021-06-13 ENCOUNTER — Telehealth: Payer: Self-pay | Admitting: Genetic Counselor

## 2021-06-13 ENCOUNTER — Encounter: Payer: 59 | Admitting: Genetic Counselor

## 2021-06-13 ENCOUNTER — Other Ambulatory Visit: Payer: Self-pay | Admitting: Genetic Counselor

## 2021-06-13 ENCOUNTER — Other Ambulatory Visit: Payer: 59

## 2021-06-13 DIAGNOSIS — C50412 Malignant neoplasm of upper-outer quadrant of left female breast: Secondary | ICD-10-CM

## 2021-06-13 DIAGNOSIS — Z86010 Personal history of colonic polyps: Secondary | ICD-10-CM

## 2021-06-13 DIAGNOSIS — Z170 Estrogen receptor positive status [ER+]: Secondary | ICD-10-CM

## 2021-06-13 NOTE — Telephone Encounter (Signed)
Called because unable to make appt today.  Feeling overwhelmed from recent imaging/scans.  Interested in genetics eventually but not a good time.  Pt will call when ready to reschedule.  Contact information provided.

## 2021-06-13 NOTE — Progress Notes (Signed)
e

## 2021-06-13 NOTE — Progress Notes (Signed)
Patient Care Team: Molli Posey, MD as PCP - General (Obstetrics and Gynecology) [name], MD as Consulting Physician (Plastic Surgery) Donnie Mesa, MD as Consulting Physician (General Surgery) Nicholas Lose, MD as Consulting Physician (Hematology and Oncology)  DIAGNOSIS:    ICD-10-CM   1. Malignant neoplasm of upper-outer quadrant of left breast in female, estrogen receptor positive ([place])  C50.412    Z17.0       SUMMARY OF ONCOLOGIC HISTORY: Oncology History  Malignant neoplasm of upper-outer quadrant of left breast in female, estrogen receptor positive ([place])  07/19/2014 Initial Biopsy   Rt.Breast Biopsy: LCIS   11/29/2014 Surgery   Right lumpectomy: LCIS with fibrocystic changes with usual ductal hyperplasia,PASH, 0.8 cm margins for LCIS   02/06/2015 -  Anti-estrogen oral therapy   tamoxifen 20 mg daily   12/08/2018 Relapse/Recurrence   Left breast 2 cm mass at 7 o clock position: ILC grade 2, ER 90%, PR 30%, Her 2 neg, Ki 67: 15% T1CN0 stage 1A   02/07/2019 Surgery   Bilateral mastectomies with reconstruction ([place]) Right breast: atypical lobular hyperplasia and no evidence of carcinoma in 1 lymph node. Left breast: invasive lobular carcinoma with LCIS, grade 2, 2.1cm, 2 lymph nodes negative for carcinoma, and invasive carcinoma broadly present at the anterior margin.    02/07/2019 Cancer Staging   Staging form: Breast, AJCC 8th Edition - Pathologic stage from 02/07/2019: Stage IA (pT2, pN0, cM0, G2, ER+, PR+, HER2-, Oncotype DX score: 24) - Signed by Gardenia Phlegm, NP on 03/02/2019    02/07/2019 Oncotype testing   24/10%; no benefit from chemo   02/2019 -  Anti-estrogen oral therapy   Anastrozole daily switched to letrozole 09/26/2019     CHIEF COMPLIANT: Follow-up of recurrent breast cancer on letrozole  INTERVAL HISTORY: Christine Francis is a 60 y.o. with above-mentioned history of right breast cancer with recent recurrence in  the left breast. She underwent bilateral mastectomy with reconstruction followed by an excision of the anterior margin and could not tolerate anastrozole so she was switched to letrozole. She presents to the clinic today for follow-up.  Recently she had a cold breath complete health evaluation where she was noted to have elevated liver enzymes as well as hypercalcemia with a calcium of 13.  She had previous history of parathyroidectomy.  She went to her primary care physician who obtained a CT of her abdomen pelvis and she wanted to sit down with [place] and discuss the results.  She feels fatigued.  ALLERGIES:  is allergic to ampicillin, dilaudid [hydromorphone hcl], erythromycin, penicillins, sulfa drugs cross reactors, and declomycin [demeclocycline].  MEDICATIONS:  Current Outpatient Medications  Medication Sig Dispense Refill   albuterol (PROVENTIL HFA;VENTOLIN HFA) 108 (90 BASE) MCG/ACT inhaler Inhale 1-2 puffs into the lungs every 6 (six) hours as needed for wheezing or shortness of breath.      atenolol-chlorthalidone (TENORETIC) 50-25 MG per tablet Take 1 tablet by mouth daily.       Calcium Carb-Cholecalciferol (CALCIUM 600+D3 PO) Take 2 tablets by mouth daily.     Cholecalciferol (EQL VITAMIN D3) 50 MCG (2000 UT) CAPS Take 2,000 Units by mouth daily.     EPINEPHrine 0.3 mg/0.3 mL IJ SOAJ injection Inject 0.3 mg into the muscle as needed for anaphylaxis.     fluticasone (FLONASE) 50 MCG/ACT nasal spray 1-2 spray in each nostril (Patient not taking: Reported on 05/20/2021)     fluticasone furoate-vilanterol (BREO ELLIPTA) 200-25 MCG/INH AEPB Inhale 1 puff into the  lungs daily.     ibuprofen (ADVIL) 200 MG tablet Take 600 mg by mouth every 6 (six) hours as needed for headache or moderate pain. (Patient not taking: Reported on 05/20/2021)     letrozole (FEMARA) 2.5 MG tablet Take 1 tablet (2.5 mg total) by mouth daily. 90 tablet 3   levocetirizine (XYZAL) 5 MG tablet Take 5 mg by mouth daily.  (Patient not taking: Reported on 05/20/2021)     montelukast (SINGULAIR) 10 MG tablet Take 10 mg by mouth daily.      omeprazole (PRILOSEC OTC) 20 MG tablet Take 20 mg by mouth daily.     potassium chloride SA (KLOR-CON) 20 MEQ tablet Take 20 mEq by mouth daily.      valACYclovir (VALTREX) 1000 MG tablet valacyclovir 1 gram tablet  TAKE 1 TABLET BY MOUTH EVERY DAY (Patient not taking: Reported on 05/20/2021)     No current facility-administered medications for this visit.    PHYSICAL EXAMINATION: ECOG PERFORMANCE STATUS: 1 - Symptomatic but completely ambulatory  Vitals:   06/14/21 1152  BP: 128/70  Pulse: 65  Resp: 17  Temp: (!) 97.2 F (36.2 C)  SpO2: 97%   Filed Weights   06/14/21 1152  Weight: 190 lb 9.6 oz (86.5 kg)      LABORATORY DATA:  I have reviewed the data as listed CMP Latest Ref Rng & Units 01/27/2020 05/20/2019 02/08/2019  Glucose 70 - 99 mg/dL 85 108(H) 131(H)  BUN 8 - 23 mg/dL 15 13 25(H)  Creatinine 0.44 - 1.00 mg/dL 0.70 0.70 1.19(H)  Sodium 135 - 145 mmol/L 138 138 137  Potassium 3.5 - 5.1 mmol/L 3.8 4.0 3.5  Chloride 98 - 111 mmol/L 104 102 103  CO2 22 - 32 mmol/L 19(L) 24 26  Calcium 8.9 - 10.3 mg/dL 9.7 9.9 8.6(L)  Total Protein 6.4 - 8.3 g/dL - - -  Total Bilirubin 0.20 - 1.20 mg/dL - - -  Alkaline Phos 40 - 150 U/L - - -  AST 5 - 34 U/L - - -  ALT 0 - 55 U/L - - -    Lab Results  Component Value Date   WBC 13.8 (H) 02/08/2019   HGB 11.3 (L) 02/08/2019   HCT 35.0 (L) 02/08/2019   MCV 89.3 02/08/2019   PLT 215 02/08/2019   NEUTROABS 4.5 03/27/2015    ASSESSMENT & PLAN:  Malignant neoplasm of upper-outer quadrant of left breast in female, estrogen receptor positive ([place]) Right lumpectomy 11/29/2014: LCIS with fibrocystic changes with usual ductal hyperplasia,PASH, 0.8 cm margins for LCIS, took Tamoxifen from 02/07/15-12/24/18   12/08/18: Left breast 2 cm mass at 7 o clock position: ILC grade 2, ER 90%, PR 30%, Her 2 neg, Ki 67: 15% T1CN0 stage  1A   Anxiety: on Xanax   Treatment Plan:  1. Bilateral mastectomies (patient preference): 02/07/2019 Bilateral mastectomies with reconstruction ([place]) Right breast: atypical lobular hyperplasia and no evidence of carcinoma in 1 lymph node. Left breast: invasive lobular carcinoma with LCIS, grade 2, 2.1cm, 2 lymph nodes negative for carcinoma, and invasive carcinoma broadly present at the anterior margin (margin cleared 02/24/2019).  Stage IIa 2. Oncotype Dx: Score 24, distant recurrence at 9 years 10% (did not require chemo) 3. Adj Anastrozole started 03/01/2019 changed to letrozole 09/26/2019 -------------------------------------------------------------------------------------------------------------------------------------------------- Letrozole toxicities:  1. Fatigue: much improved. 2. Joint pains: much better 3.  Significant weight gain issues:  Breast cancer surveillance: 1.  Since she had bilateral mastectomies, no role of imaging 2.  Breast examination: 11/27/2020: Benign    CT CAP 06/12/2021: (Performed by her PCP for elevated liver enzymes): Enlarged cirrhotic appearing liver with portal venous collaterals and esophageal varices.  Thickened endometrium worrisome for endometrial cancer.  Severe gastritis.  Borderline enlarged obturator lymph nodes  Counseling: I discussed with her the result of the CT scan.  She will need to see her gynecologist for endometrial sampling.  Cirrhosis of the liver is probably related to [place].  She will need to see gastroenterology for further evaluation.  Patient has seen Dr. Carlean Purl before.  Hypercalcemia: We will check PTH, PTH RP, thyroid, SPEP. With a calcium level of 15, recommended emergent Zometa infusions  Return to clinic in 2 weeks for follow-up to discuss the results of additional scans including a CT of the parathyroids and bone scan.      No orders of the defined types were placed in this encounter.  The patient has a good  understanding of the overall plan. she agrees with it. she will call with any problems that may develop before the next visit here.  Total time spent: 45 mins including face to face time and time spent for planning, charting and coordination of care  Rulon Eisenmenger, MD, MPH 06/14/2021  I, Thana Ates, am acting as scribe for Dr. Nicholas Lose.  I have reviewed the above documentation for accuracy and completeness, and I agree with the above.

## 2021-06-14 ENCOUNTER — Inpatient Hospital Stay: Payer: 59

## 2021-06-14 ENCOUNTER — Other Ambulatory Visit: Payer: Self-pay | Admitting: *Deleted

## 2021-06-14 ENCOUNTER — Inpatient Hospital Stay: Payer: 59 | Attending: Hematology and Oncology | Admitting: Hematology and Oncology

## 2021-06-14 ENCOUNTER — Other Ambulatory Visit: Payer: Self-pay | Admitting: Hematology and Oncology

## 2021-06-14 ENCOUNTER — Other Ambulatory Visit: Payer: Self-pay

## 2021-06-14 DIAGNOSIS — Z170 Estrogen receptor positive status [ER+]: Secondary | ICD-10-CM | POA: Insufficient documentation

## 2021-06-14 DIAGNOSIS — K7460 Unspecified cirrhosis of liver: Secondary | ICD-10-CM | POA: Diagnosis not present

## 2021-06-14 DIAGNOSIS — Z79899 Other long term (current) drug therapy: Secondary | ICD-10-CM | POA: Diagnosis not present

## 2021-06-14 DIAGNOSIS — C50412 Malignant neoplasm of upper-outer quadrant of left female breast: Secondary | ICD-10-CM

## 2021-06-14 LAB — CBC WITH DIFFERENTIAL (CANCER CENTER ONLY)
Abs Immature Granulocytes: 0.03 10*3/uL (ref 0.00–0.07)
Basophils Absolute: 0.2 10*3/uL — ABNORMAL HIGH (ref 0.0–0.1)
Basophils Relative: 2 %
Eosinophils Absolute: 0.2 10*3/uL (ref 0.0–0.5)
Eosinophils Relative: 1 %
HCT: 43.1 % (ref 36.0–46.0)
Hemoglobin: 14.1 g/dL (ref 12.0–15.0)
Immature Granulocytes: 0 %
Lymphocytes Relative: 22 %
Lymphs Abs: 2.6 10*3/uL (ref 0.7–4.0)
MCH: 28.8 pg (ref 26.0–34.0)
MCHC: 32.7 g/dL (ref 30.0–36.0)
MCV: 88.1 fL (ref 80.0–100.0)
Monocytes Absolute: 1.1 10*3/uL — ABNORMAL HIGH (ref 0.1–1.0)
Monocytes Relative: 9 %
Neutro Abs: 7.9 10*3/uL — ABNORMAL HIGH (ref 1.7–7.7)
Neutrophils Relative %: 66 %
Platelet Count: 194 10*3/uL (ref 150–400)
RBC: 4.89 MIL/uL (ref 3.87–5.11)
RDW: 15.1 % (ref 11.5–15.5)
WBC Count: 12 10*3/uL — ABNORMAL HIGH (ref 4.0–10.5)
nRBC: 0 % (ref 0.0–0.2)

## 2021-06-14 LAB — CMP (CANCER CENTER ONLY)
ALT: 47 U/L — ABNORMAL HIGH (ref 0–44)
AST: 79 U/L — ABNORMAL HIGH (ref 15–41)
Albumin: 3.3 g/dL — ABNORMAL LOW (ref 3.5–5.0)
Alkaline Phosphatase: 123 U/L (ref 38–126)
Anion gap: 8 (ref 5–15)
BUN: 16 mg/dL (ref 8–23)
CO2: 28 mmol/L (ref 22–32)
Calcium: 15.1 mg/dL (ref 8.9–10.3)
Chloride: 101 mmol/L (ref 98–111)
Creatinine: 1.1 mg/dL — ABNORMAL HIGH (ref 0.44–1.00)
GFR, Estimated: 57 mL/min — ABNORMAL LOW (ref >60)
Glucose, Bld: 73 mg/dL (ref 70–99)
Potassium: 3.1 mmol/L — ABNORMAL LOW (ref 3.5–5.1)
Sodium: 137 mmol/L (ref 135–145)
Total Bilirubin: 2.5 mg/dL — ABNORMAL HIGH (ref 0.3–1.2)
Total Protein: 8.5 g/dL — ABNORMAL HIGH (ref 6.5–8.1)

## 2021-06-14 LAB — HEPATITIS C ANTIBODY: HCV Ab: NONREACTIVE

## 2021-06-14 LAB — VITAMIN D 25 HYDROXY (VIT D DEFICIENCY, FRACTURES): Vit D, 25-Hydroxy: 61.23 ng/mL (ref 30–100)

## 2021-06-14 LAB — BILIRUBIN, DIRECT: Bilirubin, Direct: 0.4 mg/dL — ABNORMAL HIGH (ref 0.0–0.2)

## 2021-06-14 MED ORDER — ZOLEDRONIC ACID 4 MG/100ML IV SOLN
4.0000 mg | Freq: Once | INTRAVENOUS | Status: AC
  Administered 2021-06-14: 4 mg via INTRAVENOUS
  Filled 2021-06-14: qty 100

## 2021-06-14 MED ORDER — SODIUM CHLORIDE 0.9 % IV SOLN: Freq: Once | INTRAVENOUS | Status: AC

## 2021-06-14 MED ORDER — SODIUM CHLORIDE 0.9 % IV SOLN: Freq: Once | INTRAVENOUS | Status: DC

## 2021-06-14 NOTE — Patient Instructions (Signed)
Zoledronic Acid Injection (Paget's Disease, Osteoporosis) °What is this medication? °ZOLEDRONIC ACID (ZOE le dron ik AS id) slows calcium loss from bones. It treats Paget's disease and osteoporosis. It may be used in other people at risk for bone loss. °This medicine may be used for other purposes; ask your health care provider or pharmacist if you have questions. °COMMON BRAND NAME(S): Reclast, Zometa, Zometa Powder °What should I tell my care team before I take this medication? °They need to know if you have any of these conditions: °bleeding disorder °cancer °dental disease °kidney disease °low levels of calcium in the blood °low red blood cell counts °lung or breathing disease (asthma) °receiving steroids like dexamethasone or prednisone °an unusual or allergic reaction to zoledronic acid, other medicines, foods, dyes, or preservatives °pregnant or trying to get pregnant °breast-feeding °How should I use this medication? °This drug is injected into a vein. It is given by a health care provider in a hospital or clinic setting. °A special MedGuide will be given to you before each treatment. Be sure to read this information carefully each time. °Talk to your health care provider about the use of this drug in children. Special care may be needed. °Overdosage: If you think you have taken too much of this medicine contact a poison control center or emergency room at once. °NOTE: This medicine is only for you. Do not share this medicine with others. °What if I miss a dose? °Keep appointments for follow-up doses. It is important not to miss your dose. Call your health care provider if you are unable to keep an appointment. °What may interact with this medication? °certain antibiotics given by injection °NSAIDs, medicines for pain and inflammation, like ibuprofen or naproxen °some diuretics like bumetanide, furosemide °teriparatide °This list may not describe all possible interactions. Give your health care provider a  list of all the medicines, herbs, non-prescription drugs, or dietary supplements you use. Also tell them if you smoke, drink alcohol, or use illegal drugs. Some items may interact with your medicine. °What should I watch for while using this medication? °Visit your health care provider for regular checks on your progress. It may be some time before you see the benefit from this drug. °Some people who take this drug have severe bone, joint, or muscle pain. This drug may also increase your risk for jaw problems or a broken thigh bone. Tell your health care provider right away if you have severe pain in your jaw, bones, joints, or muscles. Tell you health care provider if you have any pain that does not go away or that gets worse. °You should make sure you get enough calcium and vitamin D while you are taking this drug. Discuss the foods you eat and the vitamins you take with your health care provider. °You may need blood work done while you are taking this drug. °Tell your dentist and dental surgeon that you are taking this drug. You should not have major dental surgery while on this drug. See your dentist to have a dental exam and fix any dental problems before starting this drug. Take good care of your teeth while on this drug. Make sure you see your dentist for regular follow-up appointments. °What side effects may I notice from receiving this medication? °Side effects that you should report to your doctor or health care provider as soon as possible: °allergic reactions (skin rash, itching or hives; swelling of the face, lips, or tongue) °bone pain °infection (fever, chills, cough, sore   throat, pain or trouble passing urine) °jaw pain, especially after dental work °joint pain °kidney injury (trouble passing urine or change in the amount of urine) °low calcium levels (fast heartbeat; muscle cramps or pain; pain, tingling, or numbness in the hands or feet; seizures) °low red blood cell counts (trouble breathing;  feeling faint; lightheaded, falls; unusually weak or tired) °muscle pain °palpitations °redness, blistering, peeling, or loosening of the skin, including inside the mouth °Side effects that usually do not require medical attention (report to your doctor or health care provider if they continue or are bothersome): °diarrhea °eye irritation, itching, or pain °fever °general ill feeling or flu-like symptoms °headache °increase in blood pressure °nausea °pain, redness, or irritation at site where injected °stomach pain °upset stomach °This list may not describe all possible side effects. Call your doctor for medical advice about side effects. You may report side effects to FDA at 1-800-FDA-1088. °Where should I keep my medication? °This drug is given in a hospital or clinic. It will not be stored at home. °NOTE: This sheet is a summary. It may not cover all possible information. If you have questions about this medicine, talk to your doctor, pharmacist, or health care provider. °© 2022 [employer] (2021-03-19 00:00:00) ° ° °

## 2021-06-14 NOTE — Progress Notes (Signed)
Pt tolerated Zometa infusion well. Advised to call facility with any concerns. Went over side effects with pt and family member. Both pt and family member verbalized understanding. Pt ambulated out of facility

## 2021-06-14 NOTE — Assessment & Plan Note (Signed)
Right lumpectomy 11/29/2014: LCIS with fibrocystic changes with usual ductal hyperplasia,PASH, 0.8 cm margins for LCIS, took Tamoxifen from 02/07/15-12/24/18  12/08/18:Left breast 2 cm mass at 7 o clock position: ILC grade 2, ER 90%, PR 30%, Her 2 neg, Ki 67: 15% T1CN0 stage 1A  Anxiety:onXanax  Treatment Plan:  1. Bilateral mastectomies (patient preference): 7/27/2020Bilateral mastectomies with reconstruction ([facility], [place]) Right breast: atypical lobular hyperplasia and no evidence of carcinoma in 1 lymph node. Left breast: invasive lobular carcinoma with LCIS, grade 2, 2.1cm, 2 lymph nodes negative for carcinoma, and invasive carcinoma broadly present at the anterior margin(margin cleared 02/24/2019). Stage IIa 2. Oncotype Dx: Score 24, distant recurrence at 9 years 10% (did not require chemo) 3. Adj Anastrozolestarted 03/01/2019 changed to letrozole 09/26/2019 -------------------------------------------------------------------------------------------------------------------------------------------------- Letrozole toxicities: 1. Fatigue: much improved. 2. Joint pains: much better 3.  Significant weight gain issues:  Breast cancer surveillance: 1.Since she had bilateral mastectomies, no role of imaging 2.Breast examination: 11/27/2020: Benign   CT CAP 06/12/2021: (Performed by her PCP for elevated liver enzymes): Enlarged cirrhotic appearing liver with portal venous collaterals and esophageal varices.  Thickened endometrium worrisome for endometrial cancer.  Severe gastritis.  Borderline enlarged obturator lymph nodes  Counseling: I discussed with her the result of the CT scan.  She will need to see her gynecologist for endometrial sampling.  Cirrhosis of the liver is probably related to [place].  Return to clinic in 1 year for follow-up

## 2021-06-15 LAB — THYROID PANEL WITH TSH
Free Thyroxine Index: 3 (ref 1.2–4.9)
T3 Uptake Ratio: 33 % (ref 24–39)
T4, Total: 9 ug/dL (ref 4.5–12.0)
TSH: 1.89 u[IU]/mL (ref 0.450–4.500)

## 2021-06-17 LAB — KAPPA/LAMBDA LIGHT CHAINS
Kappa free light chain: 93.9 mg/L — ABNORMAL HIGH (ref 3.3–19.4)
Kappa, lambda light chain ratio: 1.67 — ABNORMAL HIGH (ref 0.26–1.65)
Lambda free light chains: 56.1 mg/L — ABNORMAL HIGH (ref 5.7–26.3)

## 2021-06-18 ENCOUNTER — Other Ambulatory Visit: Payer: Self-pay | Admitting: Family Medicine

## 2021-06-18 DIAGNOSIS — C801 Malignant (primary) neoplasm, unspecified: Secondary | ICD-10-CM

## 2021-06-18 LAB — MULTIPLE MYELOMA PANEL, SERUM
Albumin SerPl Elph-Mcnc: 3.2 g/dL (ref 2.9–4.4)
Albumin/Glob SerPl: 0.7 (ref 0.7–1.7)
Alpha 1: 0.4 g/dL (ref 0.0–0.4)
Alpha2 Glob SerPl Elph-Mcnc: 0.6 g/dL (ref 0.4–1.0)
B-Globulin SerPl Elph-Mcnc: 1.2 g/dL (ref 0.7–1.3)
Gamma Glob SerPl Elph-Mcnc: 2.6 g/dL — ABNORMAL HIGH (ref 0.4–1.8)
Globulin, Total: 4.8 g/dL — ABNORMAL HIGH (ref 2.2–3.9)
IgA: 455 mg/dL — ABNORMAL HIGH (ref 87–352)
IgG (Immunoglobin G), Serum: 2835 mg/dL — ABNORMAL HIGH (ref 586–1602)
IgM (Immunoglobulin M), Srm: 133 mg/dL (ref 26–217)
Total Protein ELP: 8 g/dL (ref 6.0–8.5)

## 2021-06-19 ENCOUNTER — Telehealth: Payer: Self-pay

## 2021-06-19 NOTE — Telephone Encounter (Signed)
-----   Message from Gatha Mayer, MD sent at 06/18/2021 12:09 PM EST ----- Regarding: RE: Cirrhosis liver Thanks Vinay - NASH most likely as you stated in your note. We will get her seen   Remo Lipps,  Please arrange for this patient to see me or an APP for a new diagnosis of possible cirrhosis and further evaluation.  January is ok.  CEG ----- Message ----- From: Nicholas Lose, MD Sent: 06/14/2021  12:29 PM EST To: Gatha Mayer, MD Subject: Cirrhosis liver                                Glendell Docker, CT abd on her showed cirrhosis and gastric changes. I asked her to contact you for an endoscopy if you felt she needs it. I am not shre why she got cirrhosis though.  Vinay

## 2021-06-19 NOTE — Telephone Encounter (Signed)
Pt scheduled for 07/17/2021 @ 10:30 with Dr. Carlean Purl . Pt made aware Pt verbalized understanding with all questions answered.

## 2021-06-22 LAB — PTH-RELATED PEPTIDE: PTH-related peptide: 4.6 pmol/L — ABNORMAL HIGH

## 2021-06-25 ENCOUNTER — Encounter (HOSPITAL_COMMUNITY)
Admission: RE | Admit: 2021-06-25 | Discharge: 2021-06-25 | Disposition: A | Discharge status: Home / Self Care | Payer: 59 | Source: Ambulatory Visit | Attending: Family Medicine | Admitting: Family Medicine

## 2021-06-25 ENCOUNTER — Other Ambulatory Visit: Payer: Self-pay

## 2021-06-25 DIAGNOSIS — R748 Abnormal levels of other serum enzymes: Secondary | ICD-10-CM | POA: Diagnosis present

## 2021-06-25 DIAGNOSIS — C50919 Malignant neoplasm of unspecified site of unspecified female breast: Secondary | ICD-10-CM | POA: Insufficient documentation

## 2021-06-25 DIAGNOSIS — D351 Benign neoplasm of parathyroid gland: Secondary | ICD-10-CM | POA: Insufficient documentation

## 2021-06-25 DIAGNOSIS — E213 Hyperparathyroidism, unspecified: Secondary | ICD-10-CM | POA: Insufficient documentation

## 2021-06-25 MED ORDER — TECHNETIUM TC 99M MEDRONATE IV KIT
20.0000 | PACK | Freq: Once | INTRAVENOUS | Status: AC | PRN
  Administered 2021-06-25: 21.5 via INTRAVENOUS

## 2021-06-27 ENCOUNTER — Telehealth: Payer: Self-pay

## 2021-06-27 ENCOUNTER — Other Ambulatory Visit: Payer: Self-pay

## 2021-06-27 DIAGNOSIS — C50412 Malignant neoplasm of upper-outer quadrant of left female breast: Secondary | ICD-10-CM

## 2021-06-27 NOTE — Progress Notes (Signed)
Lab orders re-placed, verified with lab staff

## 2021-06-27 NOTE — Progress Notes (Signed)
Patient Care Team: Molli Posey, MD as PCP - General (Obstetrics and Gynecology) [name], MD as Consulting Physician (Plastic Surgery) Donnie Mesa, MD as Consulting Physician (General Surgery) Nicholas Lose, MD as Consulting Physician (Hematology and Oncology)  DIAGNOSIS:    ICD-10-CM   1. Malignant neoplasm of upper-outer quadrant of left breast in female, estrogen receptor positive ([place])  C50.412    Z17.0       SUMMARY OF ONCOLOGIC HISTORY: Oncology History  Malignant neoplasm of upper-outer quadrant of left breast in female, estrogen receptor positive ([place])  07/19/2014 Initial Biopsy   Rt.Breast Biopsy: LCIS   11/29/2014 Surgery   Right lumpectomy: LCIS with fibrocystic changes with usual ductal hyperplasia,PASH, 0.8 cm margins for LCIS   02/06/2015 -  Anti-estrogen oral therapy   tamoxifen 20 mg daily   12/08/2018 Relapse/Recurrence   Left breast 2 cm mass at 7 o clock position: ILC grade 2, ER 90%, PR 30%, Her 2 neg, Ki 67: 15% T1CN0 stage 1A   02/07/2019 Surgery   Bilateral mastectomies with reconstruction (Tsuei, [place]) Right breast: atypical lobular hyperplasia and no evidence of carcinoma in 1 lymph node. Left breast: invasive lobular carcinoma with LCIS, grade 2, 2.1cm, 2 lymph nodes negative for carcinoma, and invasive carcinoma broadly present at the anterior margin.    02/07/2019 Cancer Staging   Staging form: Breast, AJCC 8th Edition - Pathologic stage from 02/07/2019: Stage IA (pT2, pN0, cM0, G2, ER+, PR+, HER2-, Oncotype DX score: 24) - Signed by Gardenia Phlegm, NP on 03/02/2019    02/07/2019 Oncotype testing   24/10%; no benefit from chemo   02/2019 -  Anti-estrogen oral therapy   Anastrozole daily switched to letrozole 09/26/2019     CHIEF COMPLIANT: Follow-up of recurrent breast cancer on letrozole  INTERVAL HISTORY: Christine Francis is a 60 y.o. with above-mentioned history of right breast cancer with recent recurrence in  the left breast. She underwent bilateral mastectomy with reconstruction followed by an excision of the anterior margin and could not tolerate anastrozole so she was switched to letrozole. She presents to the clinic today for follow-up.   ALLERGIES:  is allergic to ampicillin, dilaudid [hydromorphone hcl], erythromycin, penicillins, sulfa drugs cross reactors, and declomycin [demeclocycline].  MEDICATIONS:  Current Outpatient Medications  Medication Sig Dispense Refill   albuterol (PROVENTIL HFA;VENTOLIN HFA) 108 (90 BASE) MCG/ACT inhaler Inhale 1-2 puffs into the lungs every 6 (six) hours as needed for wheezing or shortness of breath.      atenolol-chlorthalidone (TENORETIC) 50-25 MG per tablet Take 1 tablet by mouth daily.       Calcium Carb-Cholecalciferol (CALCIUM 600+D3 PO) Take 2 tablets by mouth daily.     Cholecalciferol (EQL VITAMIN D3) 50 MCG (2000 UT) CAPS Take 2,000 Units by mouth daily.     EPINEPHrine 0.3 mg/0.3 mL IJ SOAJ injection Inject 0.3 mg into the muscle as needed for anaphylaxis.     fluticasone (FLONASE) 50 MCG/ACT nasal spray 1-2 spray in each nostril (Patient not taking: Reported on 05/20/2021)     fluticasone furoate-vilanterol (BREO ELLIPTA) 200-25 MCG/INH AEPB Inhale 1 puff into the lungs daily.     ibuprofen (ADVIL) 200 MG tablet Take 600 mg by mouth every 6 (six) hours as needed for headache or moderate pain. (Patient not taking: Reported on 05/20/2021)     letrozole (FEMARA) 2.5 MG tablet Take 1 tablet (2.5 mg total) by mouth daily. 90 tablet 3   levocetirizine (XYZAL) 5 MG tablet Take 5 mg by mouth  daily. (Patient not taking: Reported on 05/20/2021)     montelukast (SINGULAIR) 10 MG tablet Take 10 mg by mouth daily.      omeprazole (PRILOSEC OTC) 20 MG tablet Take 20 mg by mouth daily.     potassium chloride SA (KLOR-CON) 20 MEQ tablet Take 20 mEq by mouth daily.      valACYclovir (VALTREX) 1000 MG tablet valacyclovir 1 gram tablet  TAKE 1 TABLET BY MOUTH EVERY DAY  (Patient not taking: Reported on 05/20/2021)     No current facility-administered medications for this visit.    PHYSICAL EXAMINATION: ECOG PERFORMANCE STATUS: 1 - Symptomatic but completely ambulatory  Vitals:   06/28/21 1419  BP: 107/67  Pulse: 79  Resp: 16  Temp: 98.1 F (36.7 C)  SpO2: 98%   Filed Weights   06/28/21 1419  Weight: 191 lb 1.6 oz (86.7 kg)    BREAST: No palpable masses or nodules in either right or left breasts. No palpable axillary supraclavicular or infraclavicular adenopathy no breast tenderness or nipple discharge. (exam performed in the presence of a chaperone)  LABORATORY DATA:  I have reviewed the data as listed CMP Latest Ref Rng & Units 06/14/2021 01/27/2020 05/20/2019  Glucose 70 - 99 mg/dL 73 85 108(H)  BUN 8 - 23 mg/dL _0 Creatinine 0.44 - 1.00 mg/dL 1.10(H) 0.70 0.70  Sodium 135 - 145 mmol/L 137 138 138  Potassium 3.5 - 5.1 mmol/L 3.1(L) 3.8 4.0  Chloride 98 - 111 mmol/L 101 104 102  CO2 22 - 32 mmol/L 28 19(L) 24  Calcium 8.9 - 10.3 mg/dL 15.1(HH) 9.7 9.9  Total Protein 6.5 - 8.1 g/dL 8.5(H) - -  Total Bilirubin 0.3 - 1.2 mg/dL 2.5(H) - -  Alkaline Phos 38 - 126 U/L 123 - -  AST 15 - 41 U/L 79(H) - -  ALT 0 - 44 U/L 47(H) - -    Lab Results  Component Value Date   WBC 12.6 (H) 06/28/2021   HGB 13.2 06/28/2021   HCT 39.3 06/28/2021   MCV 87.1 06/28/2021   PLT 196 06/28/2021   NEUTROABS 7.4 06/28/2021    ASSESSMENT & PLAN:  Malignant neoplasm of upper-outer quadrant of left breast in female, estrogen receptor positive ([place]) Right lumpectomy 11/29/2014: LCIS with fibrocystic changes with usual ductal hyperplasia,PASH, 0.8 cm margins for LCIS, took Tamoxifen from 02/07/15-12/24/18   12/08/18: Left breast 2 cm mass at 7 o clock position: ILC grade 2, ER 90%, PR 30%, Her 2 neg, Ki 67: 15% T1CN0 stage 1A   Anxiety: on Xanax   Treatment Plan:  1. Bilateral mastectomies (patient preference): 02/07/2019 Bilateral mastectomies with  reconstruction ([facility], [place]) Right breast: atypical lobular hyperplasia and no evidence of carcinoma in 1 lymph node. Left breast: invasive lobular carcinoma with LCIS, grade 2, 2.1cm, 2 lymph nodes negative for carcinoma, and invasive carcinoma broadly present at the anterior margin (margin cleared 02/24/2019).  Stage IIa 2. Oncotype Dx: Score 24, distant recurrence at 9 years 10% (did not require chemo) 3. Adj Anastrozole started 03/01/2019 changed to letrozole 09/26/2019 --------------------------------------------------------------------------------------------------------------------------------------------------   Breast cancer surveillance: 1.  Since she had bilateral mastectomies, no role of imaging 2.  Breast examination: 11/27/2020: Benign    CT CAP 06/12/2021: (Performed by her PCP for elevated liver enzymes): Enlarged cirrhotic appearing liver with portal venous collaterals and esophageal varices.  Thickened endometrium worrisome for endometrial cancer.  Severe gastritis.  Borderline enlarged obturator lymph nodes   Counseling: I discussed with her the  result of the CT scan.  She will need to see her gynecologist for endometrial sampling.  Cirrhosis of the liver is probably related to [place].  She will need to see gastroenterology for further evaluation.  Patient has seen Dr. Carlean Purl before.   Hypercalcemia: We will check PTH, PTH RP, thyroid, SPEP. With a calcium level of 15, recommended emergent Zometa infusions  06/26/2021: Bone scan small foci of mild increased activity in frontal bones differential is hyperostosis versus metastases.  No other findings of metastatic disease. 06/28/2021: CT parathyroids is being done today 06/28/2021. I will discuss with Dr. Kaylyn Lim after the results of the CT scan are available.  PTH level is also pending.  Elevated PTH RP: Unclear pathology because she does not have any evidence of cancer elsewhere. Return to clinic in 2 weeks for  follow-up and to perform blood work for hypercalcemia.  If she needs Zometa we will transfuse Zometa when she comes back in 2 weeks.    No orders of the defined types were placed in this encounter.  The patient has a good understanding of the overall plan. she agrees with it. she will call with any problems that may develop before the next visit here.  Total time spent: 20 mins including face to face time and time spent for planning, charting and coordination of care  Rulon Eisenmenger, MD, MPH 06/28/2021  I, Thana Ates, am acting as scribe for Dr. Nicholas Lose.  I have reviewed the above documentation for accuracy and completeness, and I agree with the above.

## 2021-06-27 NOTE — Assessment & Plan Note (Signed)
Right lumpectomy 11/29/2014: LCIS with fibrocystic changes with usual ductal hyperplasia,PASH, 0.8 cm margins for LCIS, took Tamoxifen from 02/07/15-12/24/18  12/08/18:Left breast 2 cm mass at 7 o clock position: ILC grade 2, ER 90%, PR 30%, Her 2 neg, Ki 67: 15% T1CN0 stage 1A  Anxiety:onXanax  Treatment Plan:  1. Bilateral mastectomies (patient preference): 7/27/2020Bilateral mastectomies with reconstruction (Tsuei, [place]) Right breast: atypical lobular hyperplasia and no evidence of carcinoma in 1 lymph node. Left breast: invasive lobular carcinoma with LCIS, grade 2, 2.1cm, 2 lymph nodes negative for carcinoma, and invasive carcinoma broadly present at the anterior margin(margin cleared 02/24/2019). Stage IIa 2. Oncotype Dx: Score 24, distant recurrence at 9 years 10% (did not require chemo) 3. Adj Anastrozolestarted 03/01/2019 changed to letrozole 09/26/2019 -------------------------------------------------------------------------------------------------------------------------------------------------- Letrozole toxicities: 1. Fatigue: much improved. 2. Joint pains: much better 3.Significant weight gain issues:  Breast cancer surveillance: 1.Since she had bilateral mastectomies, no role of imaging 2.Breast examination: 11/27/2020: Benign   CT CAP 06/12/2021: (Performed by her PCP for elevated liver enzymes): Enlarged cirrhotic appearing liver with portal venous collaterals and esophageal varices.  Thickened endometrium worrisome for endometrial cancer.  Severe gastritis.  Borderline enlarged obturator lymph nodes  Counseling: I discussed with her the result of the CT scan.  She will need to see her gynecologist for endometrial sampling.  Cirrhosis of the liver is probably related to [place].  She will need to see gastroenterology for further evaluation.  Patient has seen Dr. Carlean Purl before.  Hypercalcemia: We will check PTH, PTH RP, thyroid, SPEP. With a calcium level  of 15, recommended emergent Zometa infusions  06/26/2021: Bone scan small foci of mild increased activity in frontal bones differential is hyperostosis versus metastases.  No other findings of metastatic disease. 06/28/2021: CT parathyroids  Return to clinic in 2 weeks for follow-up to discuss the results of additional scans including a CT of the parathyroids and bone scan.

## 2021-06-27 NOTE — Telephone Encounter (Signed)
Chart opened for review

## 2021-06-28 ENCOUNTER — Other Ambulatory Visit: Payer: Self-pay

## 2021-06-28 ENCOUNTER — Other Ambulatory Visit: Payer: 59

## 2021-06-28 ENCOUNTER — Ambulatory Visit
Admission: RE | Admit: 2021-06-28 | Discharge: 2021-06-28 | Disposition: A | Discharge status: Home / Self Care | Payer: 59 | Source: Ambulatory Visit | Attending: Family Medicine | Admitting: Family Medicine

## 2021-06-28 ENCOUNTER — Inpatient Hospital Stay: Payer: 59 | Admitting: Hematology and Oncology

## 2021-06-28 ENCOUNTER — Inpatient Hospital Stay: Payer: 59

## 2021-06-28 DIAGNOSIS — C50412 Malignant neoplasm of upper-outer quadrant of left female breast: Secondary | ICD-10-CM

## 2021-06-28 DIAGNOSIS — Z170 Estrogen receptor positive status [ER+]: Secondary | ICD-10-CM

## 2021-06-28 DIAGNOSIS — E213 Hyperparathyroidism, unspecified: Secondary | ICD-10-CM

## 2021-06-28 LAB — CBC WITH DIFFERENTIAL (CANCER CENTER ONLY)
Abs Immature Granulocytes: 0.03 10*3/uL (ref 0.00–0.07)
Basophils Absolute: 0.2 10*3/uL — ABNORMAL HIGH (ref 0.0–0.1)
Basophils Relative: 2 %
Eosinophils Absolute: 0.2 10*3/uL (ref 0.0–0.5)
Eosinophils Relative: 2 %
HCT: 39.3 % (ref 36.0–46.0)
Hemoglobin: 13.2 g/dL (ref 12.0–15.0)
Immature Granulocytes: 0 %
Lymphocytes Relative: 29 %
Lymphs Abs: 3.7 10*3/uL (ref 0.7–4.0)
MCH: 29.3 pg (ref 26.0–34.0)
MCHC: 33.6 g/dL (ref 30.0–36.0)
MCV: 87.1 fL (ref 80.0–100.0)
Monocytes Absolute: 1.1 10*3/uL — ABNORMAL HIGH (ref 0.1–1.0)
Monocytes Relative: 9 %
Neutro Abs: 7.4 10*3/uL (ref 1.7–7.7)
Neutrophils Relative %: 58 %
Platelet Count: 196 10*3/uL (ref 150–400)
RBC: 4.51 MIL/uL (ref 3.87–5.11)
RDW: 15.8 % — ABNORMAL HIGH (ref 11.5–15.5)
WBC Count: 12.6 10*3/uL — ABNORMAL HIGH (ref 4.0–10.5)
nRBC: 0 % (ref 0.0–0.2)

## 2021-06-28 LAB — CMP (CANCER CENTER ONLY)
ALT: 48 U/L — ABNORMAL HIGH (ref 0–44)
AST: 96 U/L — ABNORMAL HIGH (ref 15–41)
Albumin: 3.2 g/dL — ABNORMAL LOW (ref 3.5–5.0)
Alkaline Phosphatase: 157 U/L — ABNORMAL HIGH (ref 38–126)
Anion gap: 9 (ref 5–15)
BUN: 12 mg/dL (ref 8–23)
CO2: 22 mmol/L (ref 22–32)
Calcium: 11.5 mg/dL — ABNORMAL HIGH (ref 8.9–10.3)
Chloride: 104 mmol/L (ref 98–111)
Creatinine: 0.8 mg/dL (ref 0.44–1.00)
GFR, Estimated: >60 mL/min (ref >60)
Glucose, Bld: 79 mg/dL (ref 70–99)
Potassium: 3.2 mmol/L — ABNORMAL LOW (ref 3.5–5.1)
Sodium: 135 mmol/L (ref 135–145)
Total Bilirubin: 2.5 mg/dL — ABNORMAL HIGH (ref 0.3–1.2)
Total Protein: 8.4 g/dL — ABNORMAL HIGH (ref 6.5–8.1)

## 2021-06-28 LAB — TSH: TSH: 1.857 u[IU]/mL (ref 0.308–3.960)

## 2021-06-28 MED ORDER — IOPAMIDOL (ISOVUE-370) INJECTION 76%
80.0000 mL | Freq: Once | INTRAVENOUS | Status: AC | PRN
  Administered 2021-06-28: 80 mL via INTRAVENOUS

## 2021-06-29 LAB — PTH, INTACT AND CALCIUM
Calcium, Total (PTH): 12 mg/dL — ABNORMAL HIGH (ref 8.7–10.3)
PTH: 7 pg/mL — ABNORMAL LOW (ref 15–65)

## 2021-07-01 ENCOUNTER — Other Ambulatory Visit: Payer: Self-pay | Admitting: Hematology and Oncology

## 2021-07-01 ENCOUNTER — Telehealth: Payer: Self-pay | Admitting: Hematology and Oncology

## 2021-07-01 DIAGNOSIS — R7989 Other specified abnormal findings of blood chemistry: Secondary | ICD-10-CM

## 2021-07-01 DIAGNOSIS — C50412 Malignant neoplasm of upper-outer quadrant of left female breast: Secondary | ICD-10-CM

## 2021-07-01 NOTE — Telephone Encounter (Signed)
Scheduled per 12/16 los, pt has been called and confirmed appts at [facility] and at [facility] locations

## 2021-07-01 NOTE — Progress Notes (Signed)
I discussed with her that the results from PTH RP being elevated suggest that there is an underlying malignancy. The CT scans are not very clear and there is no abnormalities in the lung to suggest lung cancer.  I am worried that she may have metastatic lobular breast cancer which has not been visible on the regular CT scans. Therefore I recommend a PET CT scan for further evaluation. We will also consider getting a liver biopsy for further assessment. If all of these are negative then I might consider doing a bone marrow biopsy as well.  I do not think there would be any benefit from parathyroidectomy given the PTH is suppressed adequately and appropriately. Bone scan: Overall benign couple of frontal lesions unclear SPEP: Negative Patient has a made an appointment to see gynecologist for the uterine issues and will undergo D&C.

## 2021-07-02 ENCOUNTER — Encounter (HOSPITAL_COMMUNITY): Payer: Self-pay | Admitting: Radiology

## 2021-07-02 NOTE — Progress Notes (Signed)
Patient Name  Christine Francis, [name] Legal Sex  Female DOB  1961/01/09 SSN  [id number] Address  [address] [place] [address] Phone  [phone] [facility])  [phone] (Work)  [phone] (Mobile) *Preferred*    RE: US Liver Biopsy Received: Today Sandi Mariscal, MD  Garth Bigness D OK for US guided random liver Bx.   History of breast cancer, now with cirrhosis of uncertain etiology with no know liver lesion.   Per Lindi Adie - Reason:  Patient has elevated PTH RP and unclear cause for cirrhosis of the liver. Concern for metastatic lobular breast cancer, Hypercalcemia,  Elevated PTHrP level   Thx,  Ulice Dash        Previous Messages   ----- Message -----  From: Garth Bigness D  Sent: 07/01/2021  11:53 AM EST  To: Ir Procedure Requests  Subject: US Liver Biopsy                                 Part of diagnosis is random but other part needs review.   Procedure:  US Liver Biopsy   Reason:  Patient has elevated PTH RP and unclear cause for cirrhosis of the liver. Concern for metastatic lobular breast cancer, Hypercalcemia,  Elevated PTHrP level   History:  CT, NM in computer, NM PET to be scheduled   Provider:  Nicholas Lose   Provider Contact:  [phone]

## 2021-07-03 LAB — PROTEIN ELECTROPHORESIS, SERUM
A/G Ratio: 0.6 — ABNORMAL LOW (ref 0.7–1.7)
Albumin ELP: 3.1 g/dL (ref 2.9–4.4)
Alpha-1-Globulin: 0.4 g/dL (ref 0.0–0.4)
Alpha-2-Globulin: 0.6 g/dL (ref 0.4–1.0)
Beta Globulin: 1.5 g/dL — ABNORMAL HIGH (ref 0.7–1.3)
Gamma Globulin: 2.3 g/dL — ABNORMAL HIGH (ref 0.4–1.8)
Globulin, Total: 4.8 g/dL — ABNORMAL HIGH (ref 2.2–3.9)
Total Protein ELP: 7.9 g/dL (ref 6.0–8.5)

## 2021-07-05 ENCOUNTER — Other Ambulatory Visit: Payer: Self-pay

## 2021-07-05 ENCOUNTER — Ambulatory Visit (HOSPITAL_COMMUNITY)
Admission: RE | Admit: 2021-07-05 | Discharge: 2021-07-05 | Disposition: A | Discharge status: Home / Self Care | Payer: 59 | Source: Ambulatory Visit | Attending: Hematology and Oncology | Admitting: Hematology and Oncology

## 2021-07-05 DIAGNOSIS — R7989 Other specified abnormal findings of blood chemistry: Secondary | ICD-10-CM | POA: Insufficient documentation

## 2021-07-05 DIAGNOSIS — Z170 Estrogen receptor positive status [ER+]: Secondary | ICD-10-CM | POA: Insufficient documentation

## 2021-07-05 DIAGNOSIS — C50412 Malignant neoplasm of upper-outer quadrant of left female breast: Secondary | ICD-10-CM

## 2021-07-05 LAB — GLUCOSE, CAPILLARY: Glucose-Capillary: 84 mg/dL (ref 70–99)

## 2021-07-05 MED ORDER — FLUDEOXYGLUCOSE F - 18 (FDG) INJECTION
9.4700 | Freq: Once | INTRAVENOUS | Status: AC
  Administered 2021-07-05: 10:00:00 9.47 via INTRAVENOUS

## 2021-07-06 LAB — PTH-RELATED PEPTIDE: PTH-related peptide: 6.8 pmol/L — ABNORMAL HIGH

## 2021-07-11 NOTE — Progress Notes (Incomplete)
Patient Care Team: Molli Posey, MD as PCP - General (Obstetrics and Gynecology) [name], MD as Consulting Physician (Plastic Surgery) Donnie Mesa, MD as Consulting Physician (General Surgery) Nicholas Lose, MD as Consulting Physician (Hematology and Oncology)  DIAGNOSIS: No diagnosis found.  SUMMARY OF ONCOLOGIC HISTORY: Oncology History  Malignant neoplasm of upper-outer quadrant of left breast in female, estrogen receptor positive ([place])  07/19/2014 Initial Biopsy   Rt.Breast Biopsy: LCIS   11/29/2014 Surgery   Right lumpectomy: LCIS with fibrocystic changes with usual ductal hyperplasia,PASH, 0.8 cm margins for LCIS   02/06/2015 -  Anti-estrogen oral therapy   tamoxifen 20 mg daily   12/08/2018 Relapse/Recurrence   Left breast 2 cm mass at 7 o clock position: ILC grade 2, ER 90%, PR 30%, Her 2 neg, Ki 67: 15% T1CN0 stage 1A   02/07/2019 Surgery   Bilateral mastectomies with reconstruction (Tsuei, [place]) Right breast: atypical lobular hyperplasia and no evidence of carcinoma in 1 lymph node. Left breast: invasive lobular carcinoma with LCIS, grade 2, 2.1cm, 2 lymph nodes negative for carcinoma, and invasive carcinoma broadly present at the anterior margin.    02/07/2019 Cancer Staging   Staging form: Breast, AJCC 8th Edition - Pathologic stage from 02/07/2019: Stage IA (pT2, pN0, cM0, G2, ER+, PR+, HER2-, Oncotype DX score: 24) - Signed by Gardenia Phlegm, NP on 03/02/2019    02/07/2019 Oncotype testing   24/10%; no benefit from chemo   02/2019 -  Anti-estrogen oral therapy   Anastrozole daily switched to letrozole 09/26/2019     CHIEF COMPLIANT: Follow-up of recurrent breast cancer on letrozole  INTERVAL HISTORY: Christine Francis is a 60 y.o. with above-mentioned history of right breast cancer with recent recurrence in the left breast. She underwent bilateral mastectomy with reconstruction followed by an excision of the anterior margin and could  not tolerate anastrozole so she was switched to letrozole. She presents to the clinic today for follow-up.   ALLERGIES:  is allergic to ampicillin, dilaudid [hydromorphone hcl], erythromycin, penicillins, sulfa drugs cross reactors, and declomycin [demeclocycline].  MEDICATIONS:  Current Outpatient Medications  Medication Sig Dispense Refill   albuterol (PROVENTIL HFA;VENTOLIN HFA) 108 (90 BASE) MCG/ACT inhaler Inhale 1-2 puffs into the lungs every 6 (six) hours as needed for wheezing or shortness of breath.      atenolol-chlorthalidone (TENORETIC) 50-25 MG per tablet Take 1 tablet by mouth daily.       Calcium Carb-Cholecalciferol (CALCIUM 600+D3 PO) Take 2 tablets by mouth daily.     Cholecalciferol (EQL VITAMIN D3) 50 MCG (2000 UT) CAPS Take 2,000 Units by mouth daily.     EPINEPHrine 0.3 mg/0.3 mL IJ SOAJ injection Inject 0.3 mg into the muscle as needed for anaphylaxis.     Fluorouracil (TOLAK) 4 % CREA Apply 1 application topically at bedtime as needed (precancerous spots).     fluticasone furoate-vilanterol (BREO ELLIPTA) 200-25 MCG/INH AEPB Inhale 1 puff into the lungs daily as needed (shortness of breath).     letrozole (FEMARA) 2.5 MG tablet Take 1 tablet (2.5 mg total) by mouth daily. 90 tablet 3   levofloxacin (LEVAQUIN) 750 MG tablet Take 750 mg by mouth daily.     montelukast (SINGULAIR) 10 MG tablet Take 10 mg by mouth daily.      omeprazole (PRILOSEC OTC) 20 MG tablet Take 20 mg by mouth daily.     Phenylephrine-DM-GG-APAP (MUCINEX SINUS-MAX) 5-10-200-325 MG TABS Take 2 tablets by mouth daily as needed (congestion).     potassium  chloride SA (KLOR-CON) 20 MEQ tablet Take 20 mEq by mouth daily.      predniSONE (DELTASONE) 10 MG tablet Take 20-30 mg by mouth See admin instructions. Take 30 mg daily for 5 days then take 20 mg daily for 5 days     sodium chloride (OCEAN) 0.65 % SOLN nasal spray Place 1 spray into both nostrils as needed for congestion (dryness).     No current  facility-administered medications for this visit.    PHYSICAL EXAMINATION: ECOG PERFORMANCE STATUS: {CHL ONC ECOG PS:[phone]}  There were no vitals filed for this visit. There were no vitals filed for this visit.  BREAST:*** No palpable masses or nodules in either right or left breasts. No palpable axillary supraclavicular or infraclavicular adenopathy no breast tenderness or nipple discharge. (exam performed in the presence of a chaperone)  LABORATORY DATA:  I have reviewed the data as listed CMP Latest Ref Rng & Units 06/28/2021 06/28/2021 06/14/2021  Glucose 70 - 99 mg/dL 79 - 73  BUN 8 - 23 mg/dL 12 - 16  Creatinine 0.44 - 1.00 mg/dL 0.80 - 1.10(H)  Sodium 135 - 145 mmol/L 135 - 137  Potassium 3.5 - 5.1 mmol/L 3.2(L) - 3.1(L)  Chloride 98 - 111 mmol/L 104 - 101  CO2 22 - 32 mmol/L 22 - 28  Calcium 8.7 - 10.3 mg/dL 11.5(H) 12.0(H) 15.1(HH)  Total Protein 6.5 - 8.1 g/dL 8.4(H) - 8.5(H)  Total Bilirubin 0.3 - 1.2 mg/dL 2.5(H) - 2.5(H)  Alkaline Phos 38 - 126 U/L 157(H) - 123  AST 15 - 41 U/L 96(H) - 79(H)  ALT 0 - 44 U/L 48(H) - 47(H)    Lab Results  Component Value Date   WBC 12.6 (H) 06/28/2021   HGB 13.2 06/28/2021   HCT 39.3 06/28/2021   MCV 87.1 06/28/2021   PLT 196 06/28/2021   NEUTROABS 7.4 06/28/2021    ASSESSMENT & PLAN:  No problem-specific Assessment & Plan notes found for this encounter.    No orders of the defined types were placed in this encounter.  The patient has a good understanding of the overall plan. she agrees with it. she will call with any problems that may develop before the next visit here.  Total time spent: *** mins including face to face time and time spent for planning, charting and coordination of care  Rulon Eisenmenger, MD, MPH 07/11/2021  I, Thana Ates, am acting as scribe for Dr. Nicholas Lose.  {insert scribe attestation}

## 2021-07-12 ENCOUNTER — Inpatient Hospital Stay (HOSPITAL_BASED_OUTPATIENT_CLINIC_OR_DEPARTMENT_OTHER): Payer: 59

## 2021-07-12 ENCOUNTER — Inpatient Hospital Stay: Payer: 59

## 2021-07-12 ENCOUNTER — Inpatient Hospital Stay: Payer: 59 | Admitting: Hematology and Oncology

## 2021-07-12 ENCOUNTER — Other Ambulatory Visit: Payer: Self-pay

## 2021-07-12 DIAGNOSIS — Z86010 Personal history of colonic polyps: Secondary | ICD-10-CM

## 2021-07-12 DIAGNOSIS — C50412 Malignant neoplasm of upper-outer quadrant of left female breast: Secondary | ICD-10-CM

## 2021-07-12 LAB — CBC WITH DIFFERENTIAL (CANCER CENTER ONLY)
Abs Immature Granulocytes: 0.04 10*3/uL (ref 0.00–0.07)
Basophils Absolute: 0.1 10*3/uL (ref 0.0–0.1)
Basophils Relative: 1 %
Eosinophils Absolute: 0.2 10*3/uL (ref 0.0–0.5)
Eosinophils Relative: 1 %
HCT: 40.2 % (ref 36.0–46.0)
Hemoglobin: 13.2 g/dL (ref 12.0–15.0)
Immature Granulocytes: 0 %
Lymphocytes Relative: 26 %
Lymphs Abs: 4.2 10*3/uL — ABNORMAL HIGH (ref 0.7–4.0)
MCH: 29.1 pg (ref 26.0–34.0)
MCHC: 32.8 g/dL (ref 30.0–36.0)
MCV: 88.7 fL (ref 80.0–100.0)
Monocytes Absolute: 1.5 10*3/uL — ABNORMAL HIGH (ref 0.1–1.0)
Monocytes Relative: 9 %
Neutro Abs: 10.3 10*3/uL — ABNORMAL HIGH (ref 1.7–7.7)
Neutrophils Relative %: 63 %
Platelet Count: 230 10*3/uL (ref 150–400)
RBC: 4.53 MIL/uL (ref 3.87–5.11)
RDW: 16.3 % — ABNORMAL HIGH (ref 11.5–15.5)
WBC Count: 16.3 10*3/uL — ABNORMAL HIGH (ref 4.0–10.5)
nRBC: 0 % (ref 0.0–0.2)

## 2021-07-12 LAB — CMP (CANCER CENTER ONLY)
ALT: 35 U/L (ref 0–44)
AST: 70 U/L — ABNORMAL HIGH (ref 15–41)
Albumin: 3.4 g/dL — ABNORMAL LOW (ref 3.5–5.0)
Alkaline Phosphatase: 151 U/L — ABNORMAL HIGH (ref 38–126)
Anion gap: 8 (ref 5–15)
BUN: 16 mg/dL (ref 8–23)
CO2: 23 mmol/L (ref 22–32)
Calcium: 11.9 mg/dL — ABNORMAL HIGH (ref 8.9–10.3)
Chloride: 103 mmol/L (ref 98–111)
Creatinine: 0.75 mg/dL (ref 0.44–1.00)
GFR, Estimated: >60 mL/min (ref >60)
Glucose, Bld: 98 mg/dL (ref 70–99)
Potassium: 3.2 mmol/L — ABNORMAL LOW (ref 3.5–5.1)
Sodium: 134 mmol/L — ABNORMAL LOW (ref 135–145)
Total Bilirubin: 1.6 mg/dL — ABNORMAL HIGH (ref 0.3–1.2)
Total Protein: 8.1 g/dL (ref 6.5–8.1)

## 2021-07-12 LAB — GENETIC SCREENING ORDER

## 2021-07-12 MED ORDER — ZOLEDRONIC ACID 4 MG/100ML IV SOLN
4.0000 mg | Freq: Once | INTRAVENOUS | Status: AC
  Administered 2021-07-12: 13:00:00 4 mg via INTRAVENOUS
  Filled 2021-07-12: qty 100

## 2021-07-12 MED ORDER — SODIUM CHLORIDE 0.9 % IV SOLN: Freq: Once | INTRAVENOUS | Status: AC

## 2021-07-12 NOTE — Patient Instructions (Signed)

## 2021-07-12 NOTE — Assessment & Plan Note (Deleted)
Right lumpectomy 11/29/2014: LCIS with fibrocystic changes with usual ductal hyperplasia,PASH, 0.8 cm margins for LCIS, took Tamoxifen from 02/07/15-12/24/18  12/08/18:Left breast 2 cm mass at 7 o clock position: ILC grade 2, ER 90%, PR 30%, Her 2 neg, Ki 67: 15% T1CN0 stage 1A  Anxiety:onXanax  Treatment Plan:  1. Bilateral mastectomies (patient preference): 7/27/2020Bilateral mastectomies with reconstruction (Tsuei, [place]) Right breast: atypical lobular hyperplasia and no evidence of carcinoma in 1 lymph node. Left breast: invasive lobular carcinoma with LCIS, grade 2, 2.1cm, 2 lymph nodes negative for carcinoma, and invasive carcinoma broadly present at the anterior margin(margin cleared 02/24/2019). Stage IIa 2. Oncotype Dx: Score 24, distant recurrence at 9 years 10% (did not require chemo) 3. Adj Anastrozolestarted 8/18/2020changed to letrozole 09/26/2019 --------------------------------------------------------------------------------------------------------------------------------------------------  Breast cancer surveillance: 1.Since she had bilateral mastectomies, no role of imaging 2.Breast examination: 11/27/2020: Benign  CT CAP 06/12/2021: (Performed by her PCP for elevated liver enzymes): Enlarged cirrhotic appearing liver with portal venous collaterals and esophageal varices. Thickened endometrium worrisome for endometrial cancer. Severe gastritis. Borderline enlarged obturator lymph nodes  Hypercalcemia: With reduced PTH and increase PTH RP (6.8).  Concerning for distant metastases.  (Serum protein electrophoresis: No M protein, previously given Zometa Bone scan: Overall benign couple of frontal lesions unclear PET CT scan 07/05/2021: Diffuse moderate hepatic hypermetabolism concerning for pseudocirrhosis versus metastatic breast cancer.  Hypermetabolic left axillary lymph node equal vocal, heterogeneous increased marrow activity nonspecific  Plan: Liver  biopsy set up for 07/19/2021 If the liver biopsy is benign then we will do a bone marrow biopsy.

## 2021-07-17 ENCOUNTER — Ambulatory Visit: Payer: 59 | Admitting: Internal Medicine

## 2021-07-17 ENCOUNTER — Other Ambulatory Visit: Payer: Self-pay

## 2021-07-17 NOTE — Progress Notes (Signed)
Patient Care Team: Molli Posey, MD as PCP - General (Obstetrics and Gynecology) [name], MD as Consulting Physician (Plastic Surgery) Donnie Mesa, MD as Consulting Physician (General Surgery) Nicholas Lose, MD as Consulting Physician (Hematology and Oncology)  DIAGNOSIS:    ICD-10-CM   1. Malignant neoplasm of upper-outer quadrant of left breast in female, estrogen receptor positive ([place])  C50.412    Z17.0       SUMMARY OF ONCOLOGIC HISTORY: Oncology History  Malignant neoplasm of upper-outer quadrant of left breast in female, estrogen receptor positive ([place])  07/19/2014 Initial Biopsy   Rt.Breast Biopsy: LCIS   11/29/2014 Surgery   Right lumpectomy: LCIS with fibrocystic changes with usual ductal hyperplasia,PASH, 0.8 cm margins for LCIS   02/06/2015 -  Anti-estrogen oral therapy   tamoxifen 20 mg daily   12/08/2018 Relapse/Recurrence   Left breast 2 cm mass at 7 o clock position: ILC grade 2, ER 90%, PR 30%, Her 2 neg, Ki 67: 15% T1CN0 stage 1A   02/07/2019 Surgery   Bilateral mastectomies with reconstruction ([facility], [place]) Right breast: atypical lobular hyperplasia and no evidence of carcinoma in 1 lymph node. Left breast: invasive lobular carcinoma with LCIS, grade 2, 2.1cm, 2 lymph nodes negative for carcinoma, and invasive carcinoma broadly present at the anterior margin.    02/07/2019 Cancer Staging   Staging form: Breast, AJCC 8th Edition - Pathologic stage from 02/07/2019: Stage IA (pT2, pN0, cM0, G2, ER+, PR+, HER2-, Oncotype DX score: 24) - Signed by Gardenia Phlegm, NP on 03/02/2019    02/07/2019 Oncotype testing   24/10%; no benefit from chemo   02/2019 -  Anti-estrogen oral therapy   Anastrozole daily switched to letrozole 09/26/2019     CHIEF COMPLIANT: Follow-up of recurrent breast cancer on letrozole  INTERVAL HISTORY: Christine Francis is a 61 y.o. with above-mentioned history of right breast cancer with recent recurrence in  the left breast. She underwent bilateral mastectomy with reconstruction followed by an excision of the anterior margin and could not tolerate anastrozole so she was switched to letrozole. She presents to the clinic today for follow-up.   ALLERGIES:  is allergic to ampicillin, dilaudid [hydromorphone hcl], erythromycin, penicillins, sulfa drugs cross reactors, and declomycin [demeclocycline].  MEDICATIONS:  Current Outpatient Medications  Medication Sig Dispense Refill   albuterol (PROVENTIL HFA;VENTOLIN HFA) 108 (90 BASE) MCG/ACT inhaler Inhale 1-2 puffs into the lungs every 6 (six) hours as needed for wheezing or shortness of breath.      atenolol-chlorthalidone (TENORETIC) 50-25 MG per tablet Take 1 tablet by mouth daily.       Calcium Carb-Cholecalciferol (CALCIUM 600+D3 PO) Take 2 tablets by mouth daily.     Cholecalciferol (EQL VITAMIN D3) 50 MCG (2000 UT) CAPS Take 2,000 Units by mouth daily.     EPINEPHrine 0.3 mg/0.3 mL IJ SOAJ injection Inject 0.3 mg into the muscle as needed for anaphylaxis.     fluconazole (DIFLUCAN) 150 MG tablet Take 150 mg by mouth PRO.     Fluorouracil (TOLAK) 4 % CREA Apply 1 application topically at bedtime as needed (precancerous spots).     fluticasone furoate-vilanterol (BREO ELLIPTA) 200-25 MCG/INH AEPB Inhale 1 puff into the lungs daily as needed (shortness of breath).     letrozole (FEMARA) 2.5 MG tablet Take 1 tablet (2.5 mg total) by mouth daily. 90 tablet 3   levofloxacin (LEVAQUIN) 750 MG tablet Take 750 mg by mouth daily.     montelukast (SINGULAIR) 10 MG tablet Take 10 mg  by mouth daily.      omeprazole (PRILOSEC OTC) 20 MG tablet Take 20 mg by mouth daily.     Phenylephrine-DM-GG-APAP (MUCINEX SINUS-MAX) 5-10-200-325 MG TABS Take 2 tablets by mouth daily as needed (congestion).     potassium chloride SA (KLOR-CON) 20 MEQ tablet Take 20 mEq by mouth daily.      predniSONE (DELTASONE) 10 MG tablet Take 20-30 mg by mouth See admin instructions. Take 30  mg daily for 5 days then take 20 mg daily for 5 days     sodium chloride (OCEAN) 0.65 % SOLN nasal spray Place 1 spray into both nostrils as needed for congestion (dryness).     No current facility-administered medications for this visit.    PHYSICAL EXAMINATION: ECOG PERFORMANCE STATUS: 1 - Symptomatic but completely ambulatory  Vitals:   07/18/21 0936  BP: (!) 123/57  Pulse: 77  Resp: 16  Temp: 97.9 F (36.6 C)  SpO2: 99%   Filed Weights   07/18/21 0936  Weight: 188 lb (85.3 kg)      LABORATORY DATA:  I have reviewed the data as listed CMP Latest Ref Rng & Units 07/18/2021 07/12/2021 06/28/2021  Glucose 70 - 99 mg/dL 117(H) 98 79  BUN 8 - 23 mg/dL _0 Creatinine 0.44 - 1.00 mg/dL 0.79 0.75 0.80  Sodium 135 - 145 mmol/L 134(L) 134(L) 135  Potassium 3.5 - 5.1 mmol/L 3.2(L) 3.2(L) 3.2(L)  Chloride 98 - 111 mmol/L 102 103 104  CO2 22 - 32 mmol/L _1 Calcium 8.9 - 10.3 mg/dL 12.9(H) 11.9(H) 11.5(H)  Total Protein 6.5 - 8.1 g/dL 8.1 8.1 8.4(H)  Total Bilirubin 0.3 - 1.2 mg/dL 2.6(H) 1.6(H) 2.5(H)  Alkaline Phos 38 - 126 U/L 142(H) 151(H) 157(H)  AST 15 - 41 U/L 100(H) 70(H) 96(H)  ALT 0 - 44 U/L 49(H) 35 48(H)    Lab Results  Component Value Date   WBC 17.9 (H) 07/18/2021   HGB 13.6 07/18/2021   HCT 41.3 07/18/2021   MCV 88.4 07/18/2021   PLT 196 07/18/2021   NEUTROABS 11.6 (H) 07/18/2021    ASSESSMENT & PLAN:  Malignant neoplasm of upper-outer quadrant of left breast in female, estrogen receptor positive ([place]) Right lumpectomy 11/29/2014: LCIS with fibrocystic changes with usual ductal hyperplasia,PASH, 0.8 cm margins for LCIS, took Tamoxifen from 02/07/15-12/24/18   12/08/18: Left breast 2 cm mass at 7 o clock position: ILC grade 2, ER 90%, PR 30%, Her 2 neg, Ki 67: 15% T1CN0 stage 1A   Anxiety: on Xanax   Treatment Plan:  1. Bilateral mastectomies (patient preference): 02/07/2019 Bilateral mastectomies with reconstruction ([facility], [place]) Right  breast: atypical lobular hyperplasia and no evidence of carcinoma in 1 lymph node. Left breast: invasive lobular carcinoma with LCIS, grade 2, 2.1cm, 2 lymph nodes negative for carcinoma, and invasive carcinoma broadly present at the anterior margin (margin cleared 02/24/2019).  Stage IIa 2. Oncotype Dx: Score 24, distant recurrence at 9 years 10% (did not require chemo) 3. Adj Anastrozole started 03/01/2019 changed to letrozole 09/26/2019 -------------------------------------------------------------------------------------------------------------------------------------------------- CT CAP 06/12/2021: (Performed by her PCP for elevated liver enzymes): Enlarged cirrhotic appearing liver with portal venous collaterals and esophageal varices.  Thickened endometrium worrisome for endometrial cancer.  Severe gastritis.  Borderline enlarged obturator lymph nodes  06/26/2021: Bone scan small foci of mild increased activity in frontal bones differential is hyperostosis versus metastases.  No other findings of metastatic disease.  Hypercalcemia: Received Zometa infusions, today she will receive Zometa for calcium  of 12.9 Elevated PTH RP: Unclear etiology  CT parathyroid: 06/28/2021: 9 mm soft tissue nodule possible parathyroid adenoma, 6 mm left lobe of the thyroid nodule PET CT scan 07/05/2021: Diffuse hepatic hypermetabolism concerning for liver metastases versus hepatocellular carcinoma, hypermetabolic left axillary lymph node suspicious for metastatic disease, heterogeneous bone marrow activity nonspecific, portal vein hypertension  Recommendation: 1.  Liver biopsy to be done 07/19/2021 2. patient to see gynecologist for [facility] 3.  If the liver biopsy is negative then we will perform a bone marrow biopsy.  Return to clinic after liver biopsy to discuss results    No orders of the defined types were placed in this encounter.  The patient has a good understanding of the overall plan. she agrees with it.  she will call with any problems that may develop before the next visit here.  Total time spent: 30 mins including face to face time and time spent for planning, charting and coordination of care  Rulon Eisenmenger, MD, MPH 07/18/2021  I, Thana Ates, am acting as scribe for Dr. Nicholas Lose.  I have reviewed the above documentation for accuracy and completeness, and I agree with the above.

## 2021-07-18 ENCOUNTER — Other Ambulatory Visit: Payer: Self-pay | Admitting: Radiology

## 2021-07-18 ENCOUNTER — Inpatient Hospital Stay: Payer: 59 | Admitting: Hematology and Oncology

## 2021-07-18 ENCOUNTER — Other Ambulatory Visit: Payer: Self-pay

## 2021-07-18 ENCOUNTER — Inpatient Hospital Stay: Payer: 59

## 2021-07-18 ENCOUNTER — Other Ambulatory Visit: Payer: Self-pay | Admitting: Student

## 2021-07-18 DIAGNOSIS — C787 Secondary malignant neoplasm of liver and intrahepatic bile duct: Secondary | ICD-10-CM | POA: Insufficient documentation

## 2021-07-18 DIAGNOSIS — Z170 Estrogen receptor positive status [ER+]: Secondary | ICD-10-CM | POA: Insufficient documentation

## 2021-07-18 DIAGNOSIS — C50412 Malignant neoplasm of upper-outer quadrant of left female breast: Secondary | ICD-10-CM | POA: Insufficient documentation

## 2021-07-18 DIAGNOSIS — R180 Malignant ascites: Secondary | ICD-10-CM | POA: Insufficient documentation

## 2021-07-18 DIAGNOSIS — C7949 Secondary malignant neoplasm of other parts of nervous system: Secondary | ICD-10-CM | POA: Insufficient documentation

## 2021-07-18 LAB — CBC WITH DIFFERENTIAL (CANCER CENTER ONLY)
Abs Immature Granulocytes: 0.08 10*3/uL — ABNORMAL HIGH (ref 0.00–0.07)
Basophils Absolute: 0.3 10*3/uL — ABNORMAL HIGH (ref 0.0–0.1)
Basophils Relative: 1 %
Eosinophils Absolute: 0.2 10*3/uL (ref 0.0–0.5)
Eosinophils Relative: 1 %
HCT: 41.3 % (ref 36.0–46.0)
Hemoglobin: 13.6 g/dL (ref 12.0–15.0)
Immature Granulocytes: 0 %
Lymphocytes Relative: 24 %
Lymphs Abs: 4.3 10*3/uL — ABNORMAL HIGH (ref 0.7–4.0)
MCH: 29.1 pg (ref 26.0–34.0)
MCHC: 32.9 g/dL (ref 30.0–36.0)
MCV: 88.4 fL (ref 80.0–100.0)
Monocytes Absolute: 1.6 10*3/uL — ABNORMAL HIGH (ref 0.1–1.0)
Monocytes Relative: 9 %
Neutro Abs: 11.6 10*3/uL — ABNORMAL HIGH (ref 1.7–7.7)
Neutrophils Relative %: 65 %
Platelet Count: 196 10*3/uL (ref 150–400)
RBC: 4.67 MIL/uL (ref 3.87–5.11)
RDW: 16 % — ABNORMAL HIGH (ref 11.5–15.5)
WBC Count: 17.9 10*3/uL — ABNORMAL HIGH (ref 4.0–10.5)
nRBC: 0.2 % (ref 0.0–0.2)

## 2021-07-18 LAB — CMP (CANCER CENTER ONLY)
ALT: 49 U/L — ABNORMAL HIGH (ref 0–44)
AST: 100 U/L — ABNORMAL HIGH (ref 15–41)
Albumin: 3.4 g/dL — ABNORMAL LOW (ref 3.5–5.0)
Alkaline Phosphatase: 142 U/L — ABNORMAL HIGH (ref 38–126)
Anion gap: 8 (ref 5–15)
BUN: 15 mg/dL (ref 8–23)
CO2: 24 mmol/L (ref 22–32)
Calcium: 12.9 mg/dL — ABNORMAL HIGH (ref 8.9–10.3)
Chloride: 102 mmol/L (ref 98–111)
Creatinine: 0.79 mg/dL (ref 0.44–1.00)
GFR, Estimated: >60 mL/min (ref >60)
Glucose, Bld: 117 mg/dL — ABNORMAL HIGH (ref 70–99)
Potassium: 3.2 mmol/L — ABNORMAL LOW (ref 3.5–5.1)
Sodium: 134 mmol/L — ABNORMAL LOW (ref 135–145)
Total Bilirubin: 2.6 mg/dL — ABNORMAL HIGH (ref 0.3–1.2)
Total Protein: 8.1 g/dL (ref 6.5–8.1)

## 2021-07-18 MED ORDER — ZOLEDRONIC ACID 4 MG/100ML IV SOLN
4.0000 mg | Freq: Once | INTRAVENOUS | Status: AC
  Administered 2021-07-18: 4 mg via INTRAVENOUS
  Filled 2021-07-18: qty 100

## 2021-07-18 MED ORDER — SODIUM CHLORIDE 0.9 % IV SOLN: Freq: Once | INTRAVENOUS | Status: AC

## 2021-07-18 NOTE — Assessment & Plan Note (Signed)
Right lumpectomy 11/29/2014: LCIS with fibrocystic changes with usual ductal hyperplasia,PASH, 0.8 cm margins for LCIS, took Tamoxifen from 02/07/15-12/24/18  12/08/18:Left breast 2 cm mass at 7 o clock position: ILC grade 2, ER 90%, PR 30%, Her 2 neg, Ki 67: 15% T1CN0 stage 1A  Anxiety:onXanax  Treatment Plan:  1. Bilateral mastectomies (patient preference): 7/27/2020Bilateral mastectomies with reconstruction (Ts[facility]i, [place]) Right breast: atypical lobular hyperplasia and no evidence of carcinoma in 1 lymph node. Left breast: invasive lobular carcinoma with LCIS, grade 2, 2.1cm, 2 lymph nodes negative for carcinoma, and invasive carcinoma broadly present at the anterior margin(margin cleared 02/24/2019). Stage IIa 2. Oncotype Dx: Score 24, distant recurrence at 9 years 10% (did not require chemo) 3. Adj Anastrozolestarted 8/18/2020changed to letrozole 09/26/2019 -------------------------------------------------------------------------------------------------------------------------------------------------- CT CAP 06/12/2021: (Performed by her PCP for elevated liver enzymes): Enlarged cirrhotic appearing liver with portal venous collaterals and esophageal varices. Thickened endometrium worrisome for endometrial cancer. Severe gastritis. Borderline enlarged obturator lymph nodes  06/26/2021: Bone scan small foci of mild increased activity in frontal bones differential is hyperostosis versus metastases.  No other findings of metastatic disease. 06/28/2021: CT parathyroids is being done today 06/28/2021.  Hypercalcemia: Received Zometa infusions Elevated PTH RP: Unclear etiology  CT parathyroid: 06/28/2021: 9 mm soft tissue nodule possible parathyroid adenoma, 6 mm left lobe of the thyroid nodule PET CT scan 07/05/2021: Diffuse hepatic hypermetabolism concerning for liver metastases versus hepatocellular carcinoma, hypermetabolic left axillary lymph node suspicious for metastatic  disease, heterogeneous bone marrow activity nonspecific, portal vein hypertension  Recommendation: 1.  Liver biopsy 2. patient to see gynecologist for [facility]  Return to clinic after liver biopsy to discuss results

## 2021-07-18 NOTE — Patient Instructions (Signed)

## 2021-07-19 ENCOUNTER — Other Ambulatory Visit: Payer: Self-pay

## 2021-07-19 ENCOUNTER — Ambulatory Visit (HOSPITAL_COMMUNITY)
Admission: RE | Admit: 2021-07-19 | Discharge: 2021-07-19 | Disposition: A | Discharge status: Home / Self Care | Payer: 59 | Source: Ambulatory Visit | Attending: Hematology and Oncology | Admitting: Hematology and Oncology

## 2021-07-19 DIAGNOSIS — I10 Essential (primary) hypertension: Secondary | ICD-10-CM | POA: Insufficient documentation

## 2021-07-19 DIAGNOSIS — K589 Irritable bowel syndrome without diarrhea: Secondary | ICD-10-CM | POA: Insufficient documentation

## 2021-07-19 DIAGNOSIS — N6332 Unspecified lump in axillary tail of the left breast: Secondary | ICD-10-CM | POA: Insufficient documentation

## 2021-07-19 DIAGNOSIS — K7460 Unspecified cirrhosis of liver: Secondary | ICD-10-CM | POA: Insufficient documentation

## 2021-07-19 DIAGNOSIS — Z853 Personal history of malignant neoplasm of breast: Secondary | ICD-10-CM | POA: Insufficient documentation

## 2021-07-19 DIAGNOSIS — F419 Anxiety disorder, unspecified: Secondary | ICD-10-CM | POA: Insufficient documentation

## 2021-07-19 DIAGNOSIS — Z9013 Acquired absence of bilateral breasts and nipples: Secondary | ICD-10-CM | POA: Insufficient documentation

## 2021-07-19 DIAGNOSIS — A419 Sepsis, unspecified organism: Secondary | ICD-10-CM | POA: Diagnosis not present

## 2021-07-19 DIAGNOSIS — K766 Portal hypertension: Secondary | ICD-10-CM | POA: Insufficient documentation

## 2021-07-19 DIAGNOSIS — E785 Hyperlipidemia, unspecified: Secondary | ICD-10-CM | POA: Insufficient documentation

## 2021-07-19 DIAGNOSIS — K219 Gastro-esophageal reflux disease without esophagitis: Secondary | ICD-10-CM | POA: Insufficient documentation

## 2021-07-19 DIAGNOSIS — R7989 Other specified abnormal findings of blood chemistry: Secondary | ICD-10-CM

## 2021-07-19 DIAGNOSIS — C787 Secondary malignant neoplasm of liver and intrahepatic bile duct: Secondary | ICD-10-CM | POA: Diagnosis not present

## 2021-07-19 LAB — CBC
HCT: 40.5 % (ref 36.0–46.0)
Hemoglobin: 13.7 g/dL (ref 12.0–15.0)
MCH: 29.9 pg (ref 26.0–34.0)
MCHC: 33.8 g/dL (ref 30.0–36.0)
MCV: 88.4 fL (ref 80.0–100.0)
Platelets: 187 10*3/uL (ref 150–400)
RBC: 4.58 MIL/uL (ref 3.87–5.11)
RDW: 16 % — ABNORMAL HIGH (ref 11.5–15.5)
WBC: 20.3 10*3/uL — ABNORMAL HIGH (ref 4.0–10.5)
nRBC: 0.1 % (ref 0.0–0.2)

## 2021-07-19 LAB — PROTIME-INR
INR: 1.2 (ref 0.8–1.2)
Prothrombin Time: 15.5 seconds — ABNORMAL HIGH (ref 11.4–15.2)

## 2021-07-19 MED ORDER — FENTANYL CITRATE (PF) 100 MCG/2ML IJ SOLN
INTRAMUSCULAR | Status: AC
  Filled 2021-07-19: qty 2

## 2021-07-19 MED ORDER — LIDOCAINE HCL (PF) 1 % IJ SOLN
INTRAMUSCULAR | Status: AC
  Filled 2021-07-19: qty 30

## 2021-07-19 MED ORDER — MIDAZOLAM HCL 2 MG/2ML IJ SOLN
INTRAMUSCULAR | Status: AC | PRN
  Administered 2021-07-19: 1 mg via INTRAVENOUS

## 2021-07-19 MED ORDER — SODIUM CHLORIDE 0.9 % IV SOLN: INTRAVENOUS | Status: DC

## 2021-07-19 MED ORDER — MIDAZOLAM HCL 2 MG/2ML IJ SOLN
INTRAMUSCULAR | Status: AC
  Filled 2021-07-19: qty 2

## 2021-07-19 MED ORDER — FENTANYL CITRATE (PF) 100 MCG/2ML IJ SOLN
INTRAMUSCULAR | Status: AC | PRN
Start: 2021-07-19 — End: 2021-07-19
  Administered 2021-07-19: 50 ug via INTRAVENOUS

## 2021-07-19 MED ORDER — GELATIN ABSORBABLE 12-7 MM EX MISC
CUTANEOUS | Status: AC
  Filled 2021-07-19: qty 1

## 2021-07-19 NOTE — Procedures (Signed)
Vascular and Interventional Radiology Procedure Note  Patient: [name] DOB: [birth year] Medical Record Number: [id number] Note Date/Time: 07/19/21 1:59 PM   Performing Physician: Michaelle Birks, MD Assistant(s): None  Diagnosis: Hx Breast CA. Tumor replaced liver.  Procedure: LIVER BIOPSY, NON-TARGETED  Anesthesia: Conscious Sedation Complications: None Estimated Blood Loss: Minimal Specimens: Sent for Pathology  Findings:  Successful Ultrasound-guided biopsy of Liver. A total of 3 samples were obtained. Hemostasis of the tract was achieved using Gelfoam Slurry Embolization.  Plan: Bed rest for 2 hours.  See detailed procedure note with images in PACS. The patient tolerated the procedure well without incident or complication and was returned to Recovery in stable condition.    Michaelle Birks, MD [facility]   Pager. [place]

## 2021-07-19 NOTE — Consult Note (Signed)
Chief Complaint: Patient was seen in consultation today for image guided random liver biopsy  Referring Physician(s): [place]  Supervising Physician: Jacqulynn Cadet  Patient Status: [facility] - Out-pt  History of Present Illness: Christine Francis is a 61 y.o. female with PMH sig for anxiety, GERD, HLD, HTN, IBS, right breast cancer with recent recurrence in the left breast (lobular carcinoma). She has had bilat mastectomies with reconstruction. Recent PET scan has revealed:  1. Relatively diffuse moderate hepatic hypermetabolism in the setting of apparent cirrhosis on CT. 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 Differential considerations include infiltrative hepatocellular carcinoma. Recommend tissue sampling. 2. Hypermetabolic left axillary node, more equivocal but suspicious for metastatic disease. 3. Mildly heterogeneously increased marrow activity, nonspecific. If the patient is eventually diagnosed with metastatic breast cancer, early osseous metastasis would be a concern. 4. Portal venous hypertension with slight increase in abdominopelvic fluid. 5. Perineal hypermetabolism which is favored to be related to urinary contamination.  She has elevated PTH RP level, hypercalcemia and unclear cause for cirrhosis/concern for metastatic lobular breast cancer. She is scheduled today for image guided random liver biopsy for further evaluation.    Past Medical History:  Diagnosis Date   Allergy    allergy shots weekly   Anxiety    no meds currently   Asthma    Breast cancer ([place])    left - LCIS - bilat mastec   Cancer ([place])    skin- basil cancer face, chest and left leg--   GERD (gastroesophageal reflux disease)    Hyperlipidemia    diet controlled, no meds   Hypertension    IBS (irritable bowel syndrome)    no problems currently   Personal history of colonic adenomas 11/17/2005   11/17/2005 - 3  adenomas - one  A TV adenoma was 12 mm (max)    Past Surgical History:  Procedure Laterality Date   BIOPSY BREAST  2000   right; benign   BREAST BIOPSY  2012   right breast; pre cancerous   BREAST LUMPECTOMY Right 04/1999, 11/2009   BREAST LUMPECTOMY WITH RADIOACTIVE SEED LOCALIZATION Right 11/29/2014   Procedure: BREAST LUMPECTOMY WITH RADIOACTIVE SEED LOCALIZATION;  Surgeon: Donnie Mesa, MD;  Location: [place];  Service: General;  Laterality: Right;   BREAST RECONSTRUCTION WITH PLACEMENT OF TISSUE EXPANDER AND ALLODERM Bilateral 02/07/2019   Procedure: BILATERAL BREAST RECONSTRUCTION WITH PLACEMENT OF TISSUE EXPANDER AND ALLODERM;  Surgeon: [name], MD;  Location: [place];  Service: Plastics;  Laterality: Bilateral;   BREAST RECONSTRUCTION WITH PLACEMENT OF TISSUE EXPANDER AND ALLODERM Left 05/24/2019   Procedure: BREAST RECONSTRUCTION WITH PLACEMENT OF TISSUE EXPANDER AND ALLODERM;  Surgeon: [name], MD;  Location: [place];  Service: Plastics;  Laterality: Left;   BUNIONECTOMY  1986   bilateral   cervical cryosurgery  12/2013   COLONOSCOPY     x 2   DIAGNOSTIC LAPAROSCOPY     laproscopy  1998   LIPOSUCTION Bilateral 01/31/2020   Procedure: LIPOSUCTION BILATERAL LATERAL CHEST WALL;  Surgeon: [name], MD;  Location: [place];  Service: Plastics;  Laterality: Bilateral;   MASTECTOMY W/ SENTINEL NODE BIOPSY Bilateral 02/07/2019   Procedure: BILATERAL MASTECTOMIES WITH LEFT SENTINEL LYMPH NODE BIOPSY;  Surgeon: Donnie Mesa, MD;  Location: [place];  Service: General;  Laterality: Bilateral;  PEC BLOCK   PARATHYROIDECTOMY  2005   POLYPECTOMY     RE-EXCISION OF BREAST LUMPECTOMY Left 02/24/2019  Procedure: RE-EXCISION LEFT MASTECTOMY- LEFT DERMAL FLAP;  Surgeon: Donnie Mesa, MD;  Location: [place];  Service: General;  Laterality: Left;   REMOVAL OF BILATERAL TISSUE EXPANDERS WITH  PLACEMENT OF BILATERAL BREAST IMPLANTS Bilateral 01/31/2020   Procedure: REMOVAL OF BILATERAL TISSUE EXPANDERS WITH PLACEMENT OF BILATERAL SILICONE BREAST IMPLANTS;  Surgeon: [name], MD;  Location: [place];  Service: Plastics;  Laterality: Bilateral;   REMOVAL OF TISSUE EXPANDER AND PLACEMENT OF IMPLANT Left 02/24/2019   Procedure: REMOVAL OF LEFT CHEST TISSUE EXPANDER WITH ALLODERM;  Surgeon: [name], MD;  Location: [place];  Service: Plastics;  Laterality: Left;   TISSUE EXPANDER FILLING Right 02/24/2019   Procedure: RIGHT CHEST TISSUE EXPANDER FILLING;  Surgeon: [name], MD;  Location: [place];  Service: Plastics;  Laterality: Right;  (475ml 0.9% Normal Saline)   uterine bx     thickening of uterus was the reason for havin bx   WISDOM TOOTH EXTRACTION      Allergies: Ampicillin, Dilaudid [hydromorphone hcl], Erythromycin, Penicillins, Sulfa drugs cross reactors, and Declomycin [demeclocycline]  Medications: Prior to Admission medications   Medication Sig Start Date End Date Taking? Authorizing Provider  albuterol (PROVENTIL HFA;VENTOLIN HFA) 108 (90 BASE) MCG/ACT inhaler Inhale 1-2 puffs into the lungs every 6 (six) hours as needed for wheezing or shortness of breath.    Yes [provider]  atenolol-chlorthalidone (TENORETIC) 50-25 MG per tablet Take 1 tablet by mouth daily.     Yes [provider]  Calcium Carb-Cholecalciferol (CALCIUM 600+D3 PO) Take 2 tablets by mouth daily.   Yes [provider]  Cholecalciferol (EQL VITAMIN D3) 50 MCG (2000 UT) CAPS Take 2,000 Units by mouth daily.   Yes [provider]  Fluorouracil (TOLAK) 4 % CREA Apply 1 application topically at bedtime as needed (precancerous spots).   Yes [provider]  fluticasone furoate-vilanterol (BREO ELLIPTA) 200-25 MCG/INH AEPB Inhale 1 puff into the lungs daily as needed (shortness of  breath).   Yes [provider]  letrozole (FEMARA) 2.5 MG tablet Take 1 tablet (2.5 mg total) by mouth daily. 11/27/20  Yes Nicholas Lose, MD  levofloxacin (LEVAQUIN) 750 MG tablet Take 750 mg by mouth daily.   Yes [provider]  montelukast (SINGULAIR) 10 MG tablet Take 10 mg by mouth daily.    Yes [provider]  omeprazole (PRILOSEC OTC) 20 MG tablet Take 20 mg by mouth daily.   Yes [provider]  Phenylephrine-DM-GG-APAP (MUCINEX SINUS-MAX) 5-10-200-325 MG TABS Take 2 tablets by mouth daily as needed (congestion).   Yes [provider]  potassium chloride SA (KLOR-CON) 20 MEQ tablet Take 20 mEq by mouth daily.    Yes [provider]  predniSONE (DELTASONE) 10 MG tablet Take 20-30 mg by mouth See admin instructions. Take 30 mg daily for 5 days then take 20 mg daily for 5 days   Yes [provider]  sodium chloride (OCEAN) 0.65 % SOLN nasal spray Place 1 spray into both nostrils as needed for congestion (dryness).   Yes [provider]  EPINEPHrine 0.3 mg/0.3 mL IJ SOAJ injection Inject 0.3 mg into the muscle as needed for anaphylaxis.    [provider]  fluconazole (DIFLUCAN) 150 MG tablet Take 150 mg by mouth PRO. 07/04/21   [provider]     Family History  Problem Relation Age of Onset   Hypertension Father    Cancer Father  melanoma/skin cancer   Colon polyps Father    Colon cancer Neg Hx    Rectal cancer Neg Hx    Stomach cancer Neg Hx    Esophageal cancer Neg Hx     Social History   Socioeconomic History   Marital status: Single    Spouse name: Not on file   Number of children: Not on file   Years of education: Not on file   Highest education level: Not on file  Occupational History   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[employer]    Employer: [employer]  Tobacco Use   Smoking status: Never   Smokeless tobacco: Never  Vaping Use   Vaping Use: Never used   Substance and Sexual Activity   Alcohol use: Yes    Alcohol/week: 1.0 standard drink    Types: 1 Glasses of wine per week    Comment: social   Drug use: No   Sexual activity: Yes    Birth control/protection: Post-menopausal  Other Topics Concern   Not on file  Social History Narrative   Not on file   Social Determinants of Health   Financial Resource Strain: Not on file  Food Insecurity: Not on file  Transportation Needs: Not on file  Physical Activity: Not on file  Stress: Not on file  Social Connections: Not on file      Review of Systems:  denies fever,HA,CP, dyspnea, abd pain,back pain,N/V or bleeding  Vital Signs: BP 131/84    Pulse 75    Temp 98.4 F (36.9 C) (Oral)    Resp 15    Ht 5\' 6"  (1.676 m)    Wt 188 lb (85.3 kg)    SpO2 99%    BMI 30.34 kg/m   Physical Exam : awake/alert; chest- CTA bilat; heart- RRR; abd-soft, sl protuberant,+BS,NT; no LE edema  Imaging: NM Bone Scan Whole Body  Result Date: 06/26/2021 CLINICAL DATA:  Previous history of breast cancer, currently with elevated liver enzymes. Evaluate for osteoblastic metastatic disease. EXAM: NUCLEAR MEDICINE WHOLE BODY BONE SCAN TECHNIQUE: Whole body anterior and posterior images were obtained approximately 3 hours after intravenous injection of radiopharmaceutical. RADIOPHARMACEUTICALS:  21.5 mCi Technetium-71m MDP IV COMPARISON:  No prior bone scan. The only prior cross-sectional imaging is chest, abdomen and pelvis CT with contrast 06/12/2021. FINDINGS: There are small foci of mild increased activity in both anterior frontal bones which could be due to hyperostosis frontalis, cartilaginous rests, or small metastases, but the intensity of uptake would usually be greater with metastatic disease than is seen here. Mild activity consistent with degenerative arthrosis is noted in the [facility] and sternoclavicular joints and feet. Remainder of the axial skeleton and visualized appendicular skeleton do not show evidence  of metastases. Soft tissue and renal activity are unremarkable. Slight thoracic levoscoliosis. IMPRESSION: 1. Small foci of mild increased activity in both anterior frontal bones. Differential diagnosis is hyperostosis frontalis interna, cartilaginous rests and metastases. 2. No other findings suspicious for osteoblastic metastatic disease. Electronically Signed   By: Telford Nab M.D.   On: 06/26/2021 21:26   NM PET Image Initial (PI) Skull Base To Thigh  Result Date: 07/05/2021 CLINICAL DATA:  Initial treatment strategy for left-sided breast cancer. Suspicion of metastatic disease based on laboratory values. EXAM: NUCLEAR MEDICINE PET SKULL BASE TO THIGH TECHNIQUE: 9.5 mCi F-18 FDG was injected intravenously. Full-ring PET imaging was performed from the skull base to thigh after the radiotracer. CT data was obtained and used for attenuation correction and  anatomic localization. Fasting blood glucose: 84 mg/dl COMPARISON:  Neck CT 06/28/2021. Bone scan of 06/26/2021. Chest abdomen and pelvic CTs of 06/12/2021 FINDINGS: Mediastinal blood pool activity: SUV max 2.3 Liver activity: SUV max NA NECK: No cervical nodal hypermetabolism. Symmetric palatine tonsil hypermetabolism is favored to be physiologic. Incidental CT findings: Multiple small bilateral cervical nodes, none pathologic by size criteria. CHEST: And inferior left axillary node is somewhat rounded at 1.0 cm and corresponds to hypermetabolism at a S.U.V. max of 3.7 on 66/4. Other more cephalad bilateral axillary nodes maintain their fatty hila and demonstrate low-level hypermetabolism, including at up to a S.U.V. max of 2.9 on 59/4, favored to be benign. No pulmonary parenchymal hypermetabolism. Incidental CT findings: Deferred to recent diagnostic CT. Aortic atherosclerosis. Bilateral breast implants ABDOMEN/PELVIS: Diffuse moderate hepatic hypermetabolism which given hepatic morphology and clinical history is suspicious for "pseudo cirrhosis".  Example diffuse heterogeneous hypermetabolism at a S.U.V. max of 7.5 within the right hepatic lobe on 110/4 and within the left hepatic lobe at a S.U.V. max of 9.0 on 130/4. No abdominopelvic nodal hypermetabolism. Perineal hypermetabolism at a S.U.V. max of 23.5 is without CT correlate favored to be related to urinary contamination. Incidental CT findings: Deferred to recent diagnostic CT. Similar trace perihepatic ascites. Portal venous hypertension with a recanalized paraumbilical vein. Small volume pelvic fluid, new. SKELETON: Mildly heterogeneously increased marrow activity throughout. Example in the right sacrum at a S.U.V. max of 4.6 and within the left humeral head at a S.U.V. max of 3.3. No CT correlates. Incidental CT findings: none IMPRESSION: 1. Relatively diffuse moderate hepatic hypermetabolism in the setting of apparent cirrhosis on CT. 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 Differential considerations include infiltrative hepatocellular carcinoma. Recommend tissue sampling. 2. Hypermetabolic left axillary node, more equivocal but suspicious for metastatic disease. 3. Mildly heterogeneously increased marrow activity, nonspecific. If the patient is eventually diagnosed with metastatic breast cancer, early osseous metastasis would be a concern. 4. Portal venous hypertension with slight increase in abdominopelvic fluid. 5. Perineal hypermetabolism which is favored to be related to urinary contamination. Consider physical exam correlation. Electronically Signed   By: Abigail Miyamoto M.D.   On: 07/05/2021 21:22   CT PARATHYROID 4D NECK W/WO  Result Date: 06/28/2021 CLINICAL DATA:  Hyperparathyroidism.  History breast cancer. EXAM: CT NECK WITH AND WITHOUT CONTRAST TECHNIQUE: Multidetector CT imaging of the neck was performed without and with intravenous contrast. Three phase scanning in the neck, precontrast, arterial phase, and venous  phase for detection of parathyroid adenoma. CONTRAST:  45mL ISOVUE-370 IOPAMIDOL (ISOVUE-370) INJECTION 76% COMPARISON:  None. FINDINGS: Pharynx and larynx: Normal. No mass or swelling. Salivary glands: No inflammation, mass, or stone. Thyroid: Thyroid normal in size. 6 mm hypoenhancing nodule left lobe of thyroid. Small calcifications adjacent to the thyroid bilaterally. Enhancing soft tissue nodule posterior and superior to the left upper pole of the thyroid. This measures approximately 3 x 4 x 9 mm. This is best seen on arterial phase and washes out on venous phase. Reference image series 6, image 41. Possible parathyroid adenoma. Lymph nodes: No enlarged lymph nodes in the neck. Scattered small lymph nodes bilaterally. Vascular: Normal vascular enhancement. Limited intracranial: Not imaged Skeleton: Disc degeneration and spurring C5-6 and C6-7. No acute skeletal abnormality. Upper chest: Lung apices clear bilaterally. Other: None IMPRESSION: 1. 3 x 4 x 9 mm hyperenhancing soft tissue nodule posterior and superior to the left lobe of the thyroid. Possible parathyroid adenoma. No  other suspicious parathyroid adenoma identified 2. 6 mm hypoenhancing nodule left lobe of thyroid. No further imaging required. (Ref: J Am Coll Radiol. 2015 Feb;12(2): 143-50). Electronically Signed   By: Franchot Gallo M.D.   On: 06/28/2021 16:30    Labs:  CBC: Recent Labs    06/14/21 1248 06/28/21 1346 07/12/21 0937 07/18/21 0917  WBC 12.0* 12.6* 16.3* 17.9*  HGB 14.1 13.2 13.2 13.6  HCT 43.1 39.3 40.2 41.3  PLT 194 196 230 196    COAGS: No results for input(s): INR, APTT in the last 8760 hours.  BMP: Recent Labs    06/14/21 1248 06/28/21 1346 07/12/21 0937 07/18/21 0917  NA 137 135 134* 134*  K 3.1* 3.2* 3.2* 3.2*  CL 101 104 103 102  CO2 28 22 23 24   GLUCOSE 73 79 98 117*  BUN 16 12 16 15   CALCIUM 15.1* 11.5*   12.0* 11.9* 12.9*  CREATININE 1.10* 0.80 0.75 0.79  GFRNONAA 57* >60 >60 >60    LIVER  FUNCTION TESTS: Recent Labs    06/14/21 1248 06/28/21 1346 07/12/21 0937 07/18/21 0917  BILITOT 2.5* 2.5* 1.6* 2.6*  AST 79* 96* 70* 100*  ALT 47* 48* 35 49*  ALKPHOS 123 157* 151* 142*  PROT 8.5* 8.4* 8.1 8.1  ALBUMIN 3.3* 3.2* 3.4* 3.4*    TUMOR MARKERS: No results for input(s): AFPTM, CEA, CA199, CHROMGRNA in the last 8760 hours.  Assessment and Plan: 61 y.o. female with PMH sig for anxiety, GERD, HLD, HTN, IBS, right breast cancer with recent recurrence in the left breast (lobular carcinoma). She has had bilat mastectomies with reconstruction. Recent PET scan has revealed:  1. Relatively diffuse moderate hepatic hypermetabolism in the setting of apparent cirrhosis on CT. 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 Differential considerations include infiltrative hepatocellular carcinoma. Recommend tissue sampling. 2. Hypermetabolic left axillary node, more equivocal but suspicious for metastatic disease. 3. Mildly heterogeneously increased marrow activity, nonspecific. If the patient is eventually diagnosed with metastatic breast cancer, early osseous metastasis would be a concern. 4. Portal venous hypertension with slight increase in abdominopelvic fluid. 5. Perineal hypermetabolism which is favored to be related to urinary contamination.  She has elevated PTH RP level, hypercalcemia and unclear cause for cirrhosis/concern for metastatic lobular breast cancer. She is scheduled today for image guided random liver biopsy for further evaluation.Risks and benefits of procedure was discussed with the patient  including, but not limited to bleeding, infection, damage to adjacent structures or low yield requiring additional tests.  All of the questions were answered and there is agreement to proceed.  Consent signed and in chart.    Thank you for this interesting consult.  I greatly enjoyed meeting Christine Francis and look forward to participating in their care.  A copy of this report was sent to the requesting provider on this date.  Electronically Signed: D. Rowe Robert, PA-C 07/19/2021, 11:41 AM   I spent a total of   25 minutes  in face to face in clinical consultation, greater than 50% of which was counseling/coordinating care for image guided random liver biopsy

## 2021-07-22 ENCOUNTER — Inpatient Hospital Stay: Payer: 59

## 2021-07-22 ENCOUNTER — Emergency Department (HOSPITAL_COMMUNITY): Payer: 59

## 2021-07-22 ENCOUNTER — Inpatient Hospital Stay (HOSPITAL_BASED_OUTPATIENT_CLINIC_OR_DEPARTMENT_OTHER): Payer: 59 | Admitting: Physician Assistant

## 2021-07-22 ENCOUNTER — Other Ambulatory Visit: Payer: Self-pay | Admitting: *Deleted

## 2021-07-22 ENCOUNTER — Inpatient Hospital Stay (HOSPITAL_COMMUNITY): Payer: 59

## 2021-07-22 ENCOUNTER — Inpatient Hospital Stay (HOSPITAL_COMMUNITY)
Admission: EM | Admit: 2021-07-22 | Discharge: 2021-07-27 | DRG: 423 | Disposition: A | Discharge status: Home / Self Care | Payer: 59 | Source: Ambulatory Visit | Attending: Internal Medicine | Admitting: Internal Medicine

## 2021-07-22 ENCOUNTER — Encounter (HOSPITAL_COMMUNITY): Payer: Self-pay

## 2021-07-22 ENCOUNTER — Other Ambulatory Visit: Payer: Self-pay

## 2021-07-22 DIAGNOSIS — R140 Abdominal distension (gaseous): Secondary | ICD-10-CM | POA: Diagnosis not present

## 2021-07-22 DIAGNOSIS — K766 Portal hypertension: Secondary | ICD-10-CM | POA: Diagnosis present

## 2021-07-22 DIAGNOSIS — R0602 Shortness of breath: Secondary | ICD-10-CM | POA: Diagnosis not present

## 2021-07-22 DIAGNOSIS — K08409 Partial loss of teeth, unspecified cause, unspecified class: Secondary | ICD-10-CM | POA: Diagnosis present

## 2021-07-22 DIAGNOSIS — I728 Aneurysm of other specified arteries: Secondary | ICD-10-CM | POA: Diagnosis not present

## 2021-07-22 DIAGNOSIS — G9341 Metabolic encephalopathy: Secondary | ICD-10-CM | POA: Diagnosis present

## 2021-07-22 DIAGNOSIS — E876 Hypokalemia: Secondary | ICD-10-CM | POA: Diagnosis present

## 2021-07-22 DIAGNOSIS — R6510 Systemic inflammatory response syndrome (SIRS) of non-infectious origin without acute organ dysfunction: Secondary | ICD-10-CM | POA: Diagnosis present

## 2021-07-22 DIAGNOSIS — E872 Acidosis, unspecified: Secondary | ICD-10-CM | POA: Diagnosis present

## 2021-07-22 DIAGNOSIS — E892 Postprocedural hypoparathyroidism: Secondary | ICD-10-CM | POA: Diagnosis present

## 2021-07-22 DIAGNOSIS — Z880 Allergy status to penicillin: Secondary | ICD-10-CM

## 2021-07-22 DIAGNOSIS — R6520 Severe sepsis without septic shock: Secondary | ICD-10-CM | POA: Diagnosis not present

## 2021-07-22 DIAGNOSIS — Z7951 Long term (current) use of inhaled steroids: Secondary | ICD-10-CM

## 2021-07-22 DIAGNOSIS — K91840 Postprocedural hemorrhage and hematoma of a digestive system organ or structure following a digestive system procedure: Secondary | ICD-10-CM | POA: Diagnosis not present

## 2021-07-22 DIAGNOSIS — R710 Precipitous drop in hematocrit: Secondary | ICD-10-CM | POA: Diagnosis present

## 2021-07-22 DIAGNOSIS — Y848 Other medical procedures as the cause of abnormal reaction of the patient, or of later complication, without mention of misadventure at the time of the procedure: Secondary | ICD-10-CM | POA: Diagnosis not present

## 2021-07-22 DIAGNOSIS — K7460 Unspecified cirrhosis of liver: Secondary | ICD-10-CM | POA: Diagnosis present

## 2021-07-22 DIAGNOSIS — R180 Malignant ascites: Secondary | ICD-10-CM | POA: Diagnosis present

## 2021-07-22 DIAGNOSIS — Z85828 Personal history of other malignant neoplasm of skin: Secondary | ICD-10-CM

## 2021-07-22 DIAGNOSIS — Z79811 Long term (current) use of aromatase inhibitors: Secondary | ICD-10-CM

## 2021-07-22 DIAGNOSIS — Z8249 Family history of ischemic heart disease and other diseases of the circulatory system: Secondary | ICD-10-CM

## 2021-07-22 DIAGNOSIS — Z170 Estrogen receptor positive status [ER+]: Secondary | ICD-10-CM | POA: Diagnosis not present

## 2021-07-22 DIAGNOSIS — K661 Hemoperitoneum: Secondary | ICD-10-CM

## 2021-07-22 DIAGNOSIS — C50412 Malignant neoplasm of upper-outer quadrant of left female breast: Secondary | ICD-10-CM | POA: Diagnosis present

## 2021-07-22 DIAGNOSIS — K219 Gastro-esophageal reflux disease without esophagitis: Secondary | ICD-10-CM | POA: Diagnosis present

## 2021-07-22 DIAGNOSIS — Z9882 Breast implant status: Secondary | ICD-10-CM | POA: Diagnosis not present

## 2021-07-22 DIAGNOSIS — F419 Anxiety disorder, unspecified: Secondary | ICD-10-CM | POA: Diagnosis present

## 2021-07-22 DIAGNOSIS — R188 Other ascites: Secondary | ICD-10-CM

## 2021-07-22 DIAGNOSIS — Z882 Allergy status to sulfonamides status: Secondary | ICD-10-CM

## 2021-07-22 DIAGNOSIS — Z9013 Acquired absence of bilateral breasts and nipples: Secondary | ICD-10-CM

## 2021-07-22 DIAGNOSIS — Z888 Allergy status to other drugs, medicaments and biological substances status: Secondary | ICD-10-CM

## 2021-07-22 DIAGNOSIS — E785 Hyperlipidemia, unspecified: Secondary | ICD-10-CM | POA: Diagnosis present

## 2021-07-22 DIAGNOSIS — A419 Sepsis, unspecified organism: Secondary | ICD-10-CM | POA: Diagnosis present

## 2021-07-22 DIAGNOSIS — C787 Secondary malignant neoplasm of liver and intrahepatic bile duct: Principal | ICD-10-CM | POA: Diagnosis present

## 2021-07-22 DIAGNOSIS — I10 Essential (primary) hypertension: Secondary | ICD-10-CM | POA: Diagnosis present

## 2021-07-22 DIAGNOSIS — Z20822 Contact with and (suspected) exposure to covid-19: Secondary | ICD-10-CM | POA: Diagnosis present

## 2021-07-22 DIAGNOSIS — Z79899 Other long term (current) drug therapy: Secondary | ICD-10-CM | POA: Diagnosis not present

## 2021-07-22 DIAGNOSIS — D72829 Elevated white blood cell count, unspecified: Secondary | ICD-10-CM

## 2021-07-22 HISTORY — PX: IR US GUIDE VASC ACCESS RIGHT: IMG2390

## 2021-07-22 HISTORY — PX: IR ANGIOGRAM SELECTIVE EACH ADDITIONAL VESSEL: IMG667

## 2021-07-22 HISTORY — PX: IR ANGIOGRAM VISCERAL SELECTIVE: IMG657

## 2021-07-22 HISTORY — PX: IR EMBO ART  VEN HEMORR LYMPH EXTRAV  INC GUIDE ROADMAPPING: IMG5450

## 2021-07-22 LAB — CMP (CANCER CENTER ONLY)
ALT: 54 U/L — ABNORMAL HIGH (ref 0–44)
AST: 120 U/L — ABNORMAL HIGH (ref 15–41)
Albumin: 3.2 g/dL — ABNORMAL LOW (ref 3.5–5.0)
Alkaline Phosphatase: 110 U/L (ref 38–126)
Anion gap: 10 (ref 5–15)
BUN: 19 mg/dL (ref 8–23)
CO2: 22 mmol/L (ref 22–32)
Calcium: 11.9 mg/dL — ABNORMAL HIGH (ref 8.9–10.3)
Chloride: 98 mmol/L (ref 98–111)
Creatinine: 0.79 mg/dL (ref 0.44–1.00)
GFR, Estimated: >60 mL/min (ref >60)
Glucose, Bld: 120 mg/dL — ABNORMAL HIGH (ref 70–99)
Potassium: 3.4 mmol/L — ABNORMAL LOW (ref 3.5–5.1)
Sodium: 130 mmol/L — ABNORMAL LOW (ref 135–145)
Total Bilirubin: 3.3 mg/dL — ABNORMAL HIGH (ref 0.3–1.2)
Total Protein: 7.5 g/dL (ref 6.5–8.1)

## 2021-07-22 LAB — CBC WITH DIFFERENTIAL/PLATELET
Abs Immature Granulocytes: 0.15 10*3/uL — ABNORMAL HIGH (ref 0.00–0.07)
Basophils Absolute: 0.2 10*3/uL — ABNORMAL HIGH (ref 0.0–0.1)
Basophils Relative: 1 %
Eosinophils Absolute: 0 10*3/uL (ref 0.0–0.5)
Eosinophils Relative: 0 %
HCT: 30.3 % — ABNORMAL LOW (ref 36.0–46.0)
Hemoglobin: 10.2 g/dL — ABNORMAL LOW (ref 12.0–15.0)
Immature Granulocytes: 1 %
Lymphocytes Relative: 22 %
Lymphs Abs: 4.6 10*3/uL — ABNORMAL HIGH (ref 0.7–4.0)
MCH: 30.3 pg (ref 26.0–34.0)
MCHC: 33.7 g/dL (ref 30.0–36.0)
MCV: 89.9 fL (ref 80.0–100.0)
Monocytes Absolute: 2 10*3/uL — ABNORMAL HIGH (ref 0.1–1.0)
Monocytes Relative: 9 %
Neutro Abs: 14.6 10*3/uL — ABNORMAL HIGH (ref 1.7–7.7)
Neutrophils Relative %: 67 %
Platelets: 173 10*3/uL (ref 150–400)
RBC: 3.37 MIL/uL — ABNORMAL LOW (ref 3.87–5.11)
RDW: 16.1 % — ABNORMAL HIGH (ref 11.5–15.5)
WBC: 21.5 10*3/uL — ABNORMAL HIGH (ref 4.0–10.5)
nRBC: 0.3 % — ABNORMAL HIGH (ref 0.0–0.2)

## 2021-07-22 LAB — CBC WITH DIFFERENTIAL (CANCER CENTER ONLY)
Abs Immature Granulocytes: 0.15 10*3/uL — ABNORMAL HIGH (ref 0.00–0.07)
Basophils Absolute: 0.2 10*3/uL — ABNORMAL HIGH (ref 0.0–0.1)
Basophils Relative: 1 %
Eosinophils Absolute: 0 10*3/uL (ref 0.0–0.5)
Eosinophils Relative: 0 %
HCT: 30.8 % — ABNORMAL LOW (ref 36.0–46.0)
Hemoglobin: 10.5 g/dL — ABNORMAL LOW (ref 12.0–15.0)
Immature Granulocytes: 1 %
Lymphocytes Relative: 21 %
Lymphs Abs: 4.1 10*3/uL — ABNORMAL HIGH (ref 0.7–4.0)
MCH: 29.7 pg (ref 26.0–34.0)
MCHC: 34.1 g/dL (ref 30.0–36.0)
MCV: 87.3 fL (ref 80.0–100.0)
Monocytes Absolute: 1.9 10*3/uL — ABNORMAL HIGH (ref 0.1–1.0)
Monocytes Relative: 10 %
Neutro Abs: 13.5 10*3/uL — ABNORMAL HIGH (ref 1.7–7.7)
Neutrophils Relative %: 67 %
Platelet Count: 171 10*3/uL (ref 150–400)
RBC: 3.53 MIL/uL — ABNORMAL LOW (ref 3.87–5.11)
RDW: 16 % — ABNORMAL HIGH (ref 11.5–15.5)
WBC Count: 20 10*3/uL — ABNORMAL HIGH (ref 4.0–10.5)
nRBC: 0.2 % (ref 0.0–0.2)

## 2021-07-22 LAB — RESP PANEL BY RT-PCR (FLU A&B, COVID) ARPGX2
Influenza A by PCR: NEGATIVE
Influenza B by PCR: NEGATIVE
SARS Coronavirus 2 by RT PCR: NEGATIVE

## 2021-07-22 LAB — BRAIN NATRIURETIC PEPTIDE: B Natriuretic Peptide: 62 pg/mL (ref 0.0–100.0)

## 2021-07-22 LAB — LACTIC ACID, PLASMA
Lactic Acid, Venous: 4.7 mmol/L (ref 0.5–1.9)
Lactic Acid, Venous: 5.6 mmol/L (ref 0.5–1.9)

## 2021-07-22 LAB — AMMONIA: Ammonia: 76 umol/L — ABNORMAL HIGH (ref 9–35)

## 2021-07-22 LAB — TROPONIN I (HIGH SENSITIVITY)
Troponin I (High Sensitivity): 59 ng/L — ABNORMAL HIGH (ref <18)
Troponin I (High Sensitivity): 57 ng/L — ABNORMAL HIGH (ref <18)

## 2021-07-22 LAB — TYPE AND SCREEN
ABO/RH(D): O POS
Antibody Screen: NEGATIVE

## 2021-07-22 LAB — ABO/RH: ABO/RH(D): O POS

## 2021-07-22 MED ORDER — DOCUSATE SODIUM 100 MG PO CAPS
100.0000 mg | ORAL_CAPSULE | Freq: Two times a day (BID) | ORAL | Status: DC
  Administered 2021-07-25 – 2021-07-27 (×4): 100 mg via ORAL
  Filled 2021-07-22 (×6): qty 1

## 2021-07-22 MED ORDER — CHLORHEXIDINE GLUCONATE CLOTH 2 % EX PADS
6.0000 | MEDICATED_PAD | Freq: Every day | CUTANEOUS | Status: DC
  Administered 2021-07-22 – 2021-07-27 (×5): 6 via TOPICAL

## 2021-07-22 MED ORDER — METRONIDAZOLE 500 MG/100ML IV SOLN
500.0000 mg | Freq: Once | INTRAVENOUS | Status: AC
  Administered 2021-07-22: 500 mg via INTRAVENOUS
  Filled 2021-07-22: qty 100

## 2021-07-22 MED ORDER — POTASSIUM CHLORIDE CRYS ER 20 MEQ PO TBCR
40.0000 meq | EXTENDED_RELEASE_TABLET | Freq: Once | ORAL | Status: AC
  Administered 2021-07-22: 40 meq via ORAL
  Filled 2021-07-22: qty 2

## 2021-07-22 MED ORDER — CEFEPIME HCL 2 G IJ SOLR
2.0000 g | Freq: Once | INTRAMUSCULAR | Status: AC
  Administered 2021-07-22: 2 g via INTRAVENOUS
  Filled 2021-07-22: qty 2

## 2021-07-22 MED ORDER — ORAL CARE MOUTH RINSE
15.0000 mL | Freq: Two times a day (BID) | OROMUCOSAL | Status: DC
  Administered 2021-07-22 – 2021-07-27 (×9): 15 mL via OROMUCOSAL

## 2021-07-22 MED ORDER — ONDANSETRON HCL 4 MG/2ML IJ SOLN
4.0000 mg | Freq: Four times a day (QID) | INTRAMUSCULAR | Status: DC | PRN
  Administered 2021-07-22: 4 mg via INTRAVENOUS
  Filled 2021-07-22: qty 2

## 2021-07-22 MED ORDER — VANCOMYCIN HCL 1750 MG/350ML IV SOLN
1750.0000 mg | Freq: Once | INTRAVENOUS | Status: AC
  Administered 2021-07-22: 1750 mg via INTRAVENOUS
  Filled 2021-07-22: qty 350

## 2021-07-22 MED ORDER — MELATONIN 3 MG PO TABS
3.0000 mg | ORAL_TABLET | Freq: Once | ORAL | Status: AC
  Administered 2021-07-22: 3 mg via ORAL
  Filled 2021-07-22: qty 1

## 2021-07-22 MED ORDER — MIDAZOLAM HCL 2 MG/2ML IJ SOLN
INTRAMUSCULAR | Status: AC | PRN
Start: 2021-07-22 — End: 2021-07-22
  Administered 2021-07-22: .5 mg via INTRAVENOUS

## 2021-07-22 MED ORDER — SODIUM CHLORIDE 0.9 % IV SOLN
2.0000 g | Freq: Three times a day (TID) | INTRAVENOUS | Status: DC
  Administered 2021-07-22 – 2021-07-23 (×2): 2 g via INTRAVENOUS
  Filled 2021-07-22 (×2): qty 2

## 2021-07-22 MED ORDER — LACTATED RINGERS IV BOLUS
1000.0000 mL | Freq: Once | INTRAVENOUS | Status: AC
  Administered 2021-07-22: 1000 mL via INTRAVENOUS

## 2021-07-22 MED ORDER — MIDAZOLAM HCL 2 MG/2ML IJ SOLN
INTRAMUSCULAR | Status: AC
  Filled 2021-07-22: qty 4

## 2021-07-22 MED ORDER — FENTANYL CITRATE (PF) 100 MCG/2ML IJ SOLN
INTRAMUSCULAR | Status: AC
  Filled 2021-07-22: qty 2

## 2021-07-22 MED ORDER — ACETAMINOPHEN 650 MG RE SUPP: 650.0000 mg | Freq: Four times a day (QID) | RECTAL | Status: DC | PRN

## 2021-07-22 MED ORDER — ONDANSETRON HCL 4 MG PO TABS: 4.0000 mg | ORAL_TABLET | Freq: Four times a day (QID) | ORAL | Status: DC | PRN

## 2021-07-22 MED ORDER — IOHEXOL 300 MG/ML  SOLN
100.0000 mL | Freq: Once | INTRAMUSCULAR | Status: AC | PRN
  Administered 2021-07-22: 60 mL via INTRA_ARTERIAL

## 2021-07-22 MED ORDER — ACETAMINOPHEN 325 MG PO TABS: 650.0000 mg | ORAL_TABLET | Freq: Four times a day (QID) | ORAL | Status: DC | PRN

## 2021-07-22 MED ORDER — VANCOMYCIN HCL 1250 MG/250ML IV SOLN
1250.0000 mg | INTRAVENOUS | Status: DC
  Filled 2021-07-22: qty 250

## 2021-07-22 MED ORDER — LACTULOSE 10 GM/15ML PO SOLN
20.0000 g | Freq: Three times a day (TID) | ORAL | Status: DC
  Administered 2021-07-22 – 2021-07-27 (×9): 20 g via ORAL
  Filled 2021-07-22 (×11): qty 30

## 2021-07-22 MED ORDER — SODIUM CHLORIDE 0.9 % IV SOLN: INTRAVENOUS | Status: DC

## 2021-07-22 MED ORDER — LIDOCAINE HCL 1 % IJ SOLN
INTRAMUSCULAR | Status: AC
  Administered 2021-07-22: 10 mL
  Filled 2021-07-22: qty 20

## 2021-07-22 MED ORDER — FENTANYL CITRATE (PF) 100 MCG/2ML IJ SOLN
INTRAMUSCULAR | Status: AC | PRN
  Administered 2021-07-22: 50 ug via INTRAVENOUS

## 2021-07-22 MED ORDER — METRONIDAZOLE 500 MG/100ML IV SOLN
500.0000 mg | Freq: Two times a day (BID) | INTRAVENOUS | Status: DC
  Administered 2021-07-23: 500 mg via INTRAVENOUS
  Filled 2021-07-22: qty 100

## 2021-07-22 MED ORDER — IOHEXOL 350 MG/ML SOLN
100.0000 mL | Freq: Once | INTRAVENOUS | Status: AC | PRN
  Administered 2021-07-22: 100 mL via INTRAVENOUS

## 2021-07-22 NOTE — ED Triage Notes (Signed)
Patient is from the [facility] today.  Patient was told she had elevated liver enzymes and a calcium level. Abdomen distended. Patient had a liver biopsy 3 days ago. Patient also c/o pain at the liver biopsy site. Patient also c/o severe fatigue and SOB.

## 2021-07-22 NOTE — Progress Notes (Signed)
Pharmacy Antibiotic Note  Christine Francis is a 61 y.o. female admitted on 07/22/2021 with sepsis.  Pharmacy has been consulted for vancomycin & cefepime dosing.  Plan: Cefepime 2 gm IV q8h Vancomycin 1750 mg IV x 1 then vancomycin 1250 mg IV q24 for est AUC 477.8 using SCr 0.8 & Vd 0.5 for BMI 30.5 Flagyl 500 mg IV q12 F/u renal function, WBC, temp, culture data Vancomycin levels as needed  Height: 5\' 6"  (167.6 cm) Weight: 85.7 kg (189 lb) IBW/kg (Calculated) : 59.3  Temp (24hrs), Avg:98 F (36.7 C), Min:98 F (36.7 C), Max:98 F (36.7 C)  Recent Labs  Lab 07/18/21 0917 07/19/21 1051 07/22/21 1117 07/22/21 1519 07/22/21 1647  WBC 17.9* 20.3* 20.0* 21.5*  --   CREATININE 0.79  --  0.79  --   --   LATICACIDVEN  --   --   --   --  4.7*    Estimated Creatinine Clearance: 80.5 mL/min (by C-G formula based on SCr of 0.79 mg/dL).    Allergies  Allergen Reactions   Ampicillin Hives   Dilaudid [Hydromorphone Hcl] Nausea And Vomiting   Erythromycin Hives and Other (See Comments)   Penicillins Hives   Sulfa Drugs Cross Reactors Hives   Declomycin [Demeclocycline] Rash and Other (See Comments)    Childhood allergy   Antimicrobials this admission:  1/9 vanc >> 1/9 cefepime >> 1/9 flagyl >>  Dose adjustments this admission:   Microbiology results:   Thank you for allowing pharmacy to be a part of this patients care.  Eudelia Bunch, Pharm.D 07/22/2021 5:37 PM

## 2021-07-22 NOTE — ED Provider Notes (Signed)
[facility] DEPT Provider Note  CSN: [id number] Arrival date & time: 07/22/21 1329  Chief Complaint(s) Abnormal Lab, Fatigue, and Headache  HPI Christine Francis is a 61 y.o. female with PMH left breast cancer status post bilateral mastectomy on hormonal therapy, hypercalcemia, HTN who presents to the emergency department for evaluation of abdominal pain and distention, fatigue, altered mental status and abnormal labs.  Patient was seen in oncology clinic today and found to have a 3 g drop in her hemoglobin as well as hypercalcemia and sent the patient to the emergency department.  Patient had a recent liver biopsy 3 days prior to arrival today.  She arrives tachycardic in the 130s but maintaining her blood pressures.   Abnormal Lab Headache Associated symptoms: abdominal pain, fatigue and nausea    Past Medical History Past Medical History:  Diagnosis Date   Allergy    allergy shots weekly   Anxiety    no meds currently   Asthma    Breast cancer ([place])    left - LCIS - bilat mastec   Cancer ([place])    skin- basil cancer face, chest and left leg--   GERD (gastroesophageal reflux disease)    Hyperlipidemia    diet controlled, no meds   Hypertension    IBS (irritable bowel syndrome)    no problems currently   Personal history of colonic adenomas 11/17/2005   11/17/2005 - 3 adenomas - one  A TV adenoma was 12 mm (max)   Patient Active Problem List   Diagnosis Date Noted   Hypercalcemia 06/14/2021   S/P bilateral mastectomy 02/07/2019   Pain in right ankle and joints of right foot 12/23/2017   Chronic pain of right knee 12/23/2017   Anxiety 03/27/2015   Malignant neoplasm of upper-outer quadrant of left breast in female, estrogen receptor positive ([place]) 09/04/2014   Fibrocystic breast changes 07/04/2011   Atypical hyperplasia of breast excised R Breast may 2011 bc pills stopped 07/04/2011   Personal history of colonic adenomas 11/17/2005   Home  Medication(s) Prior to Admission medications   Medication Sig Start Date End Date Taking? Authorizing Provider  albuterol (PROVENTIL HFA;VENTOLIN HFA) 108 (90 BASE) MCG/ACT inhaler Inhale 1-2 puffs into the lungs every 6 (six) hours as needed for wheezing or shortness of breath.     [provider]  atenolol-chlorthalidone (TENORETIC) 50-25 MG per tablet Take 1 tablet by mouth daily.      [provider]  Calcium Carb-Cholecalciferol (CALCIUM 600+D3 PO) Take 2 tablets by mouth daily.    [provider]  Cholecalciferol (EQL VITAMIN D3) 50 MCG (2000 UT) CAPS Take 2,000 Units by mouth daily.    [provider]  EPINEPHrine 0.3 mg/0.3 mL IJ SOAJ injection Inject 0.3 mg into the muscle as needed for anaphylaxis.    [provider]  fluconazole (DIFLUCAN) 150 MG tablet Take 150 mg by mouth PRO. 07/04/21   [provider]  Fluorouracil (TOLAK) 4 % CREA Apply 1 application topically at bedtime as needed (precancerous spots).    [provider]  fluticasone furoate-vilanterol (BREO ELLIPTA) 200-25 MCG/INH AEPB Inhale 1 puff into the lungs daily as needed (shortness of breath).    [provider]  letrozole (FEMARA) 2.5 MG tablet Take 1 tablet (2.5 mg total) by mouth daily. 11/27/20   Nicholas Lose, MD  levofloxacin (LEVAQUIN) 750 MG tablet Take 750 mg by mouth daily.    [provider]  montelukast (SINGULAIR) 10 MG tablet Take 10  mg by mouth daily.     [provider]  omeprazole (PRILOSEC OTC) 20 MG tablet Take 20 mg by mouth daily.    [provider]  Phenylephrine-DM-GG-APAP (MUCINEX SINUS-MAX) 5-10-200-325 MG TABS Take 2 tablets by mouth daily as needed (congestion).    [provider]  potassium chloride SA (KLOR-CON) 20 MEQ tablet Take 20 mEq by mouth daily.     [provider]  predniSONE (DELTASONE) 10 MG tablet Take 20-30 mg by mouth See admin instructions. Take 30 mg daily for 5  days then take 20 mg daily for 5 days    [provider]  sodium chloride (OCEAN) 0.65 % SOLN nasal spray Place 1 spray into both nostrils as needed for congestion (dryness).    [provider]                                                                                                                                    Past Surgical History Past Surgical History:  Procedure Laterality Date   BIOPSY BREAST  2000   right; benign   BREAST BIOPSY  2012   right breast; pre cancerous   BREAST LUMPECTOMY Right 04/1999, 11/2009   BREAST LUMPECTOMY WITH RADIOACTIVE SEED LOCALIZATION Right 11/29/2014   Procedure: BREAST LUMPECTOMY WITH RADIOACTIVE SEED LOCALIZATION;  Surgeon: Donnie Mesa, MD;  Location: [facility];  Service: General;  Laterality: Right;   BREAST RECONSTRUCTION WITH PLACEMENT OF TISSUE EXPANDER AND ALLODERM Bilateral 02/07/2019   Procedure: BILATERAL BREAST RECONSTRUCTION WITH PLACEMENT OF TISSUE EXPANDER AND ALLODERM;  Surgeon: [name], MD;  Location: [facility];  Service: Plastics;  Laterality: Bilateral;   BREAST RECONSTRUCTION WITH PLACEMENT OF TISSUE EXPANDER AND ALLODERM Left 05/24/2019   Procedure: BREAST RECONSTRUCTION WITH PLACEMENT OF TISSUE EXPANDER AND ALLODERM;  Surgeon: [name], MD;  Location: [facility];  Service: Plastics;  Laterality: Left;   BUNIONECTOMY  1986   bilateral   cervical cryosurgery  12/2013   COLONOSCOPY     x 2   DIAGNOSTIC LAPAROSCOPY     laproscopy  1998   LIPOSUCTION Bilateral 01/31/2020   Procedure: LIPOSUCTION BILATERAL LATERAL CHEST WALL;  Surgeon: [name], MD;  Location: [facility];  Service: Plastics;  Laterality: Bilateral;   MASTECTOMY W/ SENTINEL NODE BIOPSY Bilateral 02/07/2019   Procedure: BILATERAL MASTECTOMIES WITH LEFT SENTINEL LYMPH NODE BIOPSY;  Surgeon: Donnie Mesa, MD;  Location: [facility];  Service: General;  Laterality: Bilateral;  PEC  BLOCK   PARATHYROIDECTOMY  2005   POLYPECTOMY     RE-EXCISION OF BREAST LUMPECTOMY Left 02/24/2019   Procedure: RE-EXCISION LEFT MASTECTOMY- LEFT DERMAL FLAP;  Surgeon: Donnie Mesa, MD;  Location: [facility];  Service: General;  Laterality: Left;   REMOVAL OF BILATERAL TISSUE EXPANDERS WITH PLACEMENT OF BILATERAL BREAST IMPLANTS Bilateral 01/31/2020   Procedure: REMOVAL OF BILATERAL TISSUE EXPANDERS WITH PLACEMENT OF BILATERAL SILICONE BREAST  IMPLANTS;  Surgeon: [name], MD;  Location: [facility];  Service: Plastics;  Laterality: Bilateral;   REMOVAL OF TISSUE EXPANDER AND PLACEMENT OF IMPLANT Left 02/24/2019   Procedure: REMOVAL OF LEFT CHEST TISSUE EXPANDER WITH ALLODERM;  Surgeon: [name], MD;  Location: [facility];  Service: Plastics;  Laterality: Left;   TISSUE EXPANDER FILLING Right 02/24/2019   Procedure: RIGHT CHEST TISSUE EXPANDER FILLING;  Surgeon: [name], MD;  Location: [facility];  Service: Plastics;  Laterality: Right;  (414ml 0.9% Normal Saline)   uterine bx     thickening of uterus was the reason for havin bx   WISDOM TOOTH EXTRACTION     Family History Family History  Problem Relation Age of Onset   Hypertension Father    Cancer Father        melanoma/skin cancer   Colon polyps Father    Colon cancer Neg Hx    Rectal cancer Neg Hx    Stomach cancer Neg Hx    Esophageal cancer Neg Hx     Social History Social History   Tobacco Use   Smoking status: Never   Smokeless tobacco: Never  Vaping Use   Vaping Use: Never used  Substance Use Topics   Alcohol use: Yes    Alcohol/week: 1.0 standard drink    Types: 1 Glasses of wine per week    Comment: social   Drug use: No   Allergies Ampicillin, Dilaudid [hydromorphone hcl], Erythromycin, Penicillins, Sulfa drugs cross reactors, and Declomycin [demeclocycline]  Review of Systems Review of Systems  Constitutional:   Positive for fatigue.  Gastrointestinal:  Positive for abdominal pain and nausea.  Neurological:  Positive for headaches.  Psychiatric/Behavioral:  Positive for confusion.    Physical Exam Vital Signs  I have reviewed the triage vital signs BP 119/76    Pulse (!) 114    Temp 98 F (36.7 C) (Oral)    Resp (!) 24    Ht 5\' 6"  (1.676 m)    Wt 85.7 kg    SpO2 97%    BMI 30.51 kg/m   Physical Exam Vitals and nursing note reviewed.  Constitutional:      General: She is not in acute distress.    Appearance: She is well-developed. She is ill-appearing.  HENT:     Head: Normocephalic and atraumatic.  Eyes:     Conjunctiva/sclera: Conjunctivae normal.  Cardiovascular:     Rate and Rhythm: Regular rhythm. Tachycardia present.     Heart sounds: No murmur heard. Pulmonary:     Effort: Pulmonary effort is normal. No respiratory distress.     Breath sounds: Normal breath sounds.  Abdominal:     General: There is distension.     Palpations: Abdomen is soft.     Tenderness: There is abdominal tenderness.  Musculoskeletal:        General: No swelling.     Cervical back: Neck supple.  Skin:    General: Skin is warm and dry.     Capillary Refill: Capillary refill takes less than 2 seconds.  Neurological:     Mental Status: She is alert.  Psychiatric:        Mood and Affect: Mood normal.    ED Results and Treatments Labs (all labs ordered are listed, but only abnormal results are displayed) Labs Reviewed  AMMONIA - Abnormal; Notable for the following components:      Result Value   Ammonia 76 (*)  All other components within normal limits  CBC WITH DIFFERENTIAL/PLATELET - Abnormal; Notable for the following components:   WBC 21.5 (*)    RBC 3.37 (*)    Hemoglobin 10.2 (*)    HCT 30.3 (*)    RDW 16.1 (*)    nRBC 0.3 (*)    Neutro Abs 14.6 (*)    Lymphs Abs 4.6 (*)    Monocytes Absolute 2.0 (*)    Basophils Absolute 0.2 (*)    Abs Immature Granulocytes 0.15 (*)    All other  components within normal limits  TROPONIN I (HIGH SENSITIVITY) - Abnormal; Notable for the following components:   Troponin I (High Sensitivity) 59 (*)    All other components within normal limits  BRAIN NATRIURETIC PEPTIDE  LACTIC ACID, PLASMA  LACTIC ACID, PLASMA  TYPE AND SCREEN  TROPONIN I (HIGH SENSITIVITY)                                                                                                                          Radiology DG Chest 2 View  Result Date: 07/22/2021 CLINICAL DATA:  Shortness of breath after liver biopsy. EXAM: CHEST - 2 VIEW COMPARISON:  None. FINDINGS: The heart size and mediastinal contours are within normal limits. Right lung is clear. Minimal left lingular subsegmental atelectasis or scarring is noted. The visualized skeletal structures are unremarkable. IMPRESSION: Minimal left lingular subsegmental atelectasis or scarring. Electronically Signed   By: Marijo Conception M.D.   On: 07/22/2021 15:12   CT Head Wo Contrast  Result Date: 07/22/2021 CLINICAL DATA:  Head trauma moderate to severe. Brain Mets suspected. EXAM: CT HEAD WITHOUT CONTRAST TECHNIQUE: Contiguous axial images were obtained from the base of the skull through the vertex without intravenous contrast. COMPARISON:  None. FINDINGS: Brain: No evidence of acute infarction, hemorrhage, hydrocephalus, extra-axial collection or mass lesion/mass effect. Vascular: No hyperdense vessel or unexpected calcification. Skull: Normal. Negative for fracture or focal lesion. Sinuses/Orbits: No acute finding. Other: None. IMPRESSION: No acute intracranial abnormality. No appreciable mass on this noncontrast enhanced examination. Electronically Signed   By: Keane Police D.O.   On: 07/22/2021 15:12    Pertinent labs & imaging results that were available during my care of the patient were reviewed by me and considered in my medical decision making (see MDM for details).  Medications Ordered in ED Medications  lactated  ringers bolus 1,000 mL (has no administration in time range)  iohexol (OMNIPAQUE) 350 MG/ML injection 100 mL (100 mLs Intravenous Contrast Given 07/22/21 1540)  Procedures .Critical Care Performed by: Teressa Lower, MD Authorized by: Teressa Lower, MD   Critical care provider statement:    Critical care time (minutes):  30   Critical care was necessary to treat or prevent imminent or life-threatening deterioration of the following conditions:  Sepsis   Critical care was time spent personally by me on the following activities:  Development of treatment plan with patient or surrogate, discussions with consultants, evaluation of patient's response to treatment, examination of patient, ordering and review of laboratory studies, ordering and review of radiographic studies, ordering and performing treatments and interventions, pulse oximetry, re-evaluation of patient's condition and review of old charts  (including critical care time)  Medical Decision Making / ED Course   This patient presents to the ED for concern of abdominal pain headache and abnormal labs, this involves an extensive number of treatment options, and is a complaint that carries with it a high risk of complications and morbidity.  The differential diagnosis includes intra-abdominal bleed, SBP, ascites, decompensation of underlying cancer   Additional history obtained: -Additional history obtained from family member -External records from outside source obtained and reviewed including: Chart review including previous notes, labs, imaging, consultation notes   Lab Tests: -I ordered, reviewed, and interpreted labs.  The pertinent results include: Significant leukocytosis to 21.5, repeat hemoglobin of 10.2, initial high-sensitivity troponin 57, ammonia elevated to 76, lactic acid elevated to  4.7   EKG  EKG Interpretation  Date/Time:    Ventricular Rate:    PR Interval:    QRS Duration:   QT Interval:    QTC Calculation:   R Axis:     Text Interpretation:           Imaging Studies ordered: -I ordered imaging studies including CT head, chest x-ray and CT angio abdomen pelvis -I independently visualized and interpreted imaging with radiology reads showing new area of extravasation at biopsy site likely relating to a contained pseudoaneurysm versus active bleed.  CT head and chest x-ray unremarkable. -I agree with the radiologist interpretation   Medicines ordered and prescription drug management: -I ordered medication including broad-spectrum and biotics for leukocytosis, lactated Ringer's, elevated lactate and tachycardia with concerns for underlying sepsis -Reevaluation of the patient after these medicines showed that the patient improved -I have reviewed the patients home medicines and have made adjustments as needed   Consultations Obtained: I requested consultation with the interventional radiologist,  and discussed lab and imaging findings as well as pertinent plan - they recommend: Admission to medicine and they will come evaluate the patient at bedside.  The recommendations are currently pending.   ED Course: Patient presenting for multiple complaints as described above.  Patient with no abdominal distention and with recent biopsy, concern for intra-abdominal bleed.  Bedside ultrasound performed showing significant amount of new free fluid in the abdomen.  CT angio of the abdomen pelvis revealing no extravasation of biopsy site and after discussion with interventional radiology, concern for contained pseudoaneurysm.  Laboratory evaluation concerning for possible underlying sepsis with significant leukocytosis, tachycardia and lactic acidosis.  Fluid resuscitation begun as well as broad-spectrum antibiotics and patient was admitted to medicine.  Hemoglobin is  stable not requiring transfusion at this time.   Critical Interventions: Broad-spectrum initiated broad-spectrum antibiotic initiation, fluid resuscitation, consult discussions   Cardiac Monitoring: The patient was maintained on a cardiac monitor.  I personally viewed and interpreted the cardiac monitored which showed an underlying rhythm of: Sinus tachycardia   Reevaluation: After the interventions  noted above, I reevaluated the patient and found that they have :improved   Dispostion: Admission      Final Clinical Impression(s) / ED Diagnoses Final diagnoses:  None     @PCDICTATION @    Teressa Lower, MD 07/22/21 1749

## 2021-07-22 NOTE — Progress Notes (Deleted)
A consult was received from an ED physician for vancomycin & cefepime per pharmacy dosing.  The patient's profile has been reviewed for ht/wt/allergies/indication/available labs.   PCN = hives. Has had cefazolin PTA  A one time order has been placed for vancomycin 1750 mg IV x 1 & cefepime 2 gm IV x 1.    Further antibiotics/pharmacy consults should be ordered by admitting physician if indicated.                       Thank you,  Eudelia Bunch, Pharm.D 07/22/2021 4:32 PM

## 2021-07-22 NOTE — Progress Notes (Signed)
Symptom Management Consult note [place]    Patient Care Team: Molli Posey, MD as PCP - General (Obstetrics and Gynecology) [name], MD as Consulting Physician (Plastic Surgery) Donnie Mesa, MD as Consulting Physician (General Surgery) Nicholas Lose, MD as Consulting Physician (Hematology and Oncology)    Name of the patient: Christine Francis  [id number]  May 07, 1961   Date of visit: 07/22/2021    Chief complaint/ Reason for visit- fatigue, headache, unsteady gait  Oncology History  Malignant neoplasm of upper-outer quadrant of left breast in female, estrogen receptor positive ([place])  07/19/2014 Initial Biopsy   Rt.Breast Biopsy: LCIS   11/29/2014 Surgery   Right lumpectomy: LCIS with fibrocystic changes with usual ductal hyperplasia,PASH, 0.8 cm margins for LCIS   02/06/2015 -  Anti-estrogen oral therapy   tamoxifen 20 mg daily   12/08/2018 Relapse/Recurrence   Left breast 2 cm mass at 7 o clock position: ILC grade 2, ER 90%, PR 30%, Her 2 neg, Ki 67: 15% T1CN0 stage 1A   02/07/2019 Surgery   Bilateral mastectomies with reconstruction ([place]) Right breast: atypical lobular hyperplasia and no evidence of carcinoma in 1 lymph node. Left breast: invasive lobular carcinoma with LCIS, grade 2, 2.1cm, 2 lymph nodes negative for carcinoma, and invasive carcinoma broadly present at the anterior margin.    02/07/2019 Cancer Staging   Staging form: Breast, AJCC 8th Edition - Pathologic stage from 02/07/2019: Stage IA (pT2, pN0, cM0, G2, ER+, PR+, HER2-, Oncotype DX score: 24) - Signed by Gardenia Phlegm, NP on 03/02/2019    02/07/2019 Oncotype testing   24/10%; no benefit from chemo   02/2019 -  Anti-estrogen oral therapy   Anastrozole daily switched to letrozole 09/26/2019     Current Therapy: letrozole PO daily, zoledronic acid q 12 weeks last received 07/18/21  Interval history- Christine Francis is a 61 yo female with breast  cancer history as listed above presenting to [facility] today with multiple chief complaints including fatigue, headache, unsteady gait x 4 days. Symptoms are progressively worsening since onset. Patient's sister accompanies her and provides additional history. Patient has history of hypercalcemia that is currently being worked up.  She had parathyroidectomy 20 years ago.  Patient had Zometa infusion last on 07/18/2021.  She states after her first infusion she felt much improved however did not notice any difference in symptoms after the most recent one and became concerned when her symptoms continue to worsen.. Patient had liver biopsy 07/19/21. She reports pain over biopsy sites that is mostly positional and worse with palpation/applying pressure. She rates pain 3/10 and describes it as soreness.  She endorses associated nausea without emesis. She admits her abdomen seems more distended than usual and she is having constipation. She is only able to pass small round pellets. She admits to decreased flatus. She has been taking Miralax without symptom relief.  Patient states she feels foggy and sister adds that she is slow to respond.  Patient is having word finding difficulty and occasionally her speech is slurred.  This is new per patient and sister.  Patient typically lives by herself however her sister came to visit recently and did not feel safe leaving her as patient seems very unsteady and she is concerned about her symptoms progressively worsening.  Patient also admits to lack of appetite.  She is staying well-hydrated and drinks at least 64 ounces of water or juice daily.  She endorses a nonproductive cough as well.  Patient admits  to exertional shortness of breath.  She states when walking up the stairs she has to stop and catch her breath which is new for her.  She has no shortness of breath at rest.  She denies any associated chest pain.  She states her headache has been intermittent for the last 1.5 months.   She has noticed some vision changes over the last couple of weeks.  She states that she has to squint to view up close or in the distance.  She does wear a bifocal contact and does not feel like that is helping with her vision now.  Patient describes the dizziness as worse when changing positions.  She admits to feeling unsteady and has to hold onto the wall or something nearby anytime she ambulates since the symptom onset x4 days ago.  She had a fall on 07/08/2021 that she admits to informing other providers about.  No imaging of her head was obtained.  She denies hitting her head in the fall mand there was no loss of consciousness.  She admits to feeling fatigued  and unable to concentrate which are new symptoms.  Denies any fever, chills, weight loss, hemoptysis, hematemesis, back pain, urinary symptoms, blood in stool, abnormal bleeding, bruising, leg swelling, rash. Besides the recent antibiotics and prednisone prescribed by PCP for sinus infection and bronchitis patient denies any new medications.  She does not take any over-the-counter supplements. She denies frequent alcohol consumption, maybe has a glass of wine every 3 months.    ROS  All other systems are reviewed and are negative for acute change except as noted in the HPI.    Allergies  Allergen Reactions   Ampicillin Hives   Dilaudid [Hydromorphone Hcl] Nausea And Vomiting   Erythromycin Hives and Other (See Comments)   Penicillins Hives   Sulfa Drugs Cross Reactors Hives   Declomycin [Demeclocycline] Rash and Other (See Comments)    Childhood allergy     Past Medical History:  Diagnosis Date   Allergy    allergy shots weekly   Anxiety    no meds currently   Asthma    Breast cancer ([place])    left - LCIS - bilat mastec   Cancer ([place])    skin- basil cancer face, chest and left leg--   GERD (gastroesophageal reflux disease)    Hyperlipidemia    diet controlled, no meds   Hypertension    IBS (irritable bowel syndrome)     no problems currently   Personal history of colonic adenomas 11/17/2005   11/17/2005 - 3 adenomas - one  A TV adenoma was 12 mm (max)     Past Surgical History:  Procedure Laterality Date   BIOPSY BREAST  2000   right; benign   BREAST BIOPSY  2012   right breast; pre cancerous   BREAST LUMPECTOMY Right 04/1999, 11/2009   BREAST LUMPECTOMY WITH RADIOACTIVE SEED LOCALIZATION Right 11/29/2014   Procedure: BREAST LUMPECTOMY WITH RADIOACTIVE SEED LOCALIZATION;  Surgeon: Donnie Mesa, MD;  Location: [place];  Service: General;  Laterality: Right;   BREAST RECONSTRUCTION WITH PLACEMENT OF TISSUE EXPANDER AND ALLODERM Bilateral 02/07/2019   Procedure: BILATERAL BREAST RECONSTRUCTION WITH PLACEMENT OF TISSUE EXPANDER AND ALLODERM;  Surgeon: [name], MD;  Location: [place];  Service: Plastics;  Laterality: Bilateral;   BREAST RECONSTRUCTION WITH PLACEMENT OF TISSUE EXPANDER AND ALLODERM Left 05/24/2019   Procedure: BREAST RECONSTRUCTION WITH PLACEMENT OF TISSUE EXPANDER AND ALLODERM;  Surgeon: [name], MD;  Location: [facility]  [place];  Service: Plastics;  Laterality: Left;   BUNIONECTOMY  1986   bilateral   cervical cryosurgery  12/2013   COLONOSCOPY     x 2   DIAGNOSTIC LAPAROSCOPY     laproscopy  1998   LIPOSUCTION Bilateral 01/31/2020   Procedure: LIPOSUCTION BILATERAL LATERAL CHEST WALL;  Surgeon: [name], MD;  Location: [place];  Service: Plastics;  Laterality: Bilateral;   MASTECTOMY W/ SENTINEL NODE BIOPSY Bilateral 02/07/2019   Procedure: BILATERAL MASTECTOMIES WITH LEFT SENTINEL LYMPH NODE BIOPSY;  Surgeon: Donnie Mesa, MD;  Location: [place];  Service: General;  Laterality: Bilateral;  PEC BLOCK   PARATHYROIDECTOMY  2005   POLYPECTOMY     RE-EXCISION OF BREAST LUMPECTOMY Left 02/24/2019   Procedure: RE-EXCISION LEFT MASTECTOMY- LEFT DERMAL FLAP;  Surgeon: Donnie Mesa, MD;  Location: [facility];  Service: General;  Laterality: Left;   REMOVAL OF BILATERAL TISSUE EXPANDERS WITH PLACEMENT OF BILATERAL BREAST IMPLANTS Bilateral 01/31/2020   Procedure: REMOVAL OF BILATERAL TISSUE EXPANDERS WITH PLACEMENT OF BILATERAL SILICONE BREAST IMPLANTS;  Surgeon: [name], MD;  Location: [place];  Service: Plastics;  Laterality: Bilateral;   REMOVAL OF TISSUE EXPANDER AND PLACEMENT OF IMPLANT Left 02/24/2019   Procedure: REMOVAL OF LEFT CHEST TISSUE EXPANDER WITH ALLODERM;  Surgeon: [name], MD;  Location: [place];  Service: Plastics;  Laterality: Left;   TISSUE EXPANDER FILLING Right 02/24/2019   Procedure: RIGHT CHEST TISSUE EXPANDER FILLING;  Surgeon: [name], MD;  Location: [place];  Service: Plastics;  Laterality: Right;  (438m 0.9% Normal Saline)   uterine bx     thickening of uterus was the reason for havin bx   WISDOM TOOTH EXTRACTION      Social History   Socioeconomic History   Marital status: Single    Spouse name: Not on file   Number of children: Not on file   Years of education: Not on file   Highest education level: Not on file  Occupational History   Occupation: PProbation officer [employer]   Employer: [employer]  Tobacco Use   Smoking status: Never   Smokeless tobacco: Never  Vaping Use   Vaping Use: Never used  Substance and Sexual Activity   Alcohol use: Yes    Alcohol/week: 1.0 standard drink    Types: 1 Glasses of wine per week    Comment: social   Drug use: No   Sexual activity: Yes    Birth control/protection: Post-menopausal  Other Topics Concern   Not on file  Social History Narrative   Not on file   Social Determinants of Health   Financial Resource Strain: Not on file  Food Insecurity: Not on file  Transportation Needs: Not on file  Physical Activity: Not on file  Stress: Not on file  Social Connections: Not on file  Intimate Partner Violence:  Not on file    Family History  Problem Relation Age of Onset   Hypertension Father    Cancer Father        melanoma/skin cancer   Colon polyps Father    Colon cancer Neg Hx    Rectal cancer Neg Hx    Stomach cancer Neg Hx    Esophageal cancer Neg Hx      Current Outpatient Medications:    albuterol (PROVENTIL HFA;VENTOLIN HFA) 108 (90 BASE) MCG/ACT inhaler, Inhale 1-2 puffs into the lungs every 6 (six) hours as  needed for wheezing or shortness of breath. , Disp: , Rfl:    atenolol-chlorthalidone (TENORETIC) 50-25 MG per tablet, Take 1 tablet by mouth daily.  , Disp: , Rfl:    Calcium Carb-Cholecalciferol (CALCIUM 600+D3 PO), Take 2 tablets by mouth daily., Disp: , Rfl:    Cholecalciferol (EQL VITAMIN D3) 50 MCG (2000 UT) CAPS, Take 2,000 Units by mouth daily., Disp: , Rfl:    EPINEPHrine 0.3 mg/0.3 mL IJ SOAJ injection, Inject 0.3 mg into the muscle as needed for anaphylaxis., Disp: , Rfl:    fluconazole (DIFLUCAN) 150 MG tablet, Take 150 mg by mouth PRO., Disp: , Rfl:    Fluorouracil (TOLAK) 4 % CREA, Apply 1 application topically at bedtime as needed (precancerous spots)., Disp: , Rfl:    fluticasone furoate-vilanterol (BREO ELLIPTA) 200-25 MCG/INH AEPB, Inhale 1 puff into the lungs daily as needed (shortness of breath)., Disp: , Rfl:    letrozole (FEMARA) 2.5 MG tablet, Take 1 tablet (2.5 mg total) by mouth daily., Disp: 90 tablet, Rfl: 3   levofloxacin (LEVAQUIN) 750 MG tablet, Take 750 mg by mouth daily., Disp: , Rfl:    montelukast (SINGULAIR) 10 MG tablet, Take 10 mg by mouth daily. , Disp: , Rfl:    omeprazole (PRILOSEC OTC) 20 MG tablet, Take 20 mg by mouth daily., Disp: , Rfl:    Phenylephrine-DM-GG-APAP (MUCINEX SINUS-MAX) 5-10-200-325 MG TABS, Take 2 tablets by mouth daily as needed (congestion)., Disp: , Rfl:    potassium chloride SA (KLOR-CON) 20 MEQ tablet, Take 20 mEq by mouth daily. , Disp: , Rfl:    predniSONE (DELTASONE) 10 MG tablet, Take 20-30 mg by mouth See admin  instructions. Take 30 mg daily for 5 days then take 20 mg daily for 5 days, Disp: , Rfl:    sodium chloride (OCEAN) 0.65 % SOLN nasal spray, Place 1 spray into both nostrils as needed for congestion (dryness)., Disp: , Rfl:   PHYSICAL EXAM: ECOG FS:1 - Symptomatic but completely ambulatory    Vitals:   07/22/21 1146  BP: 113/79  Pulse: (!) 119  Resp: (!) 22  Temp: 98 F (36.7 C)  TempSrc: Oral  SpO2: 98%  Weight: 189 lb (85.7 kg)   Physical Exam Vitals and nursing note reviewed.  Constitutional:      Appearance: She is well-developed. She is not ill-appearing or toxic-appearing.  HENT:     Head: Normocephalic and atraumatic.     Right Ear: Tympanic membrane and external ear normal.     Left Ear: Tympanic membrane and external ear normal.     Nose: Nose normal.     Comments: No sinus tenderness    Mouth/Throat:     Mouth: Mucous membranes are moist.     Pharynx: Oropharynx is clear.  Eyes:     General: No scleral icterus.       Right eye: No discharge.        Left eye: No discharge.     Conjunctiva/sclera: Conjunctivae normal.  Neck:     Vascular: No JVD.  Cardiovascular:     Rate and Rhythm: Regular rhythm. Tachycardia present.     Pulses: Normal pulses.     Heart sounds: Normal heart sounds.  Pulmonary:     Effort: No respiratory distress.     Breath sounds: Normal breath sounds. No stridor. No wheezing, rhonchi or rales.     Comments: Tachypneic Abdominal:     General: There is no distension.     Comments: Abdomen is distended.  Tender to palpation of right upper quadrant. No peritoneal signs.  No ecchymosis on abdomen or flank.  No CVA tenderness.  Hypoactive bowel sounds.  Liver biopsy sites without signs of infection.  Musculoskeletal:        General: Normal range of motion.     Cervical back: Normal range of motion.     Right lower leg: No edema.     Left lower leg: No edema.  Skin:    General: Skin is warm and dry.     Capillary Refill: Capillary refill  takes less than 2 seconds.     Coloration: Skin is jaundiced.     Findings: No bruising.  Neurological:     Mental Status: She is alert and oriented to person, place, and time.     GCS: GCS eye subscore is 4. GCS verbal subscore is 5. GCS motor subscore is 6.     Comments: Speech is goal oriented, follows commands. Occasional word finding difficulty and slurred words. CN III-XII intact, no facial droop Normal strength in upper and lower extremities bilaterally including dorsiflexion and plantar flexion, strong and equal grip strength Sensation normal to light and sharp touch Moves extremities without ataxia, coordination intact Normal finger to nose and rapid alternating movements Patient with unsteady gait requiring maximum stand by assitance   Psychiatric:        Behavior: Behavior normal.       LABORATORY DATA: I have reviewed the data as listed CBC Latest Ref Rng & Units 07/22/2021 07/19/2021 07/18/2021  WBC 4.0 - 10.5 K/uL 20.0(H) 20.3(H) 17.9(H)  Hemoglobin 12.0 - 15.0 g/dL 10.5(L) 13.7 13.6  Hematocrit 36.0 - 46.0 % 30.8(L) 40.5 41.3  Platelets 150 - 400 K/uL 171 187 196     CMP Latest Ref Rng & Units 07/22/2021 07/18/2021 07/12/2021  Glucose 70 - 99 mg/dL 120(H) 117(H) 98  BUN 8 - 23 mg/dL 19 15 16   Creatinine 0.44 - 1.00 mg/dL 0.79 0.79 0.75  Sodium 135 - 145 mmol/L 130(L) 134(L) 134(L)  Potassium 3.5 - 5.1 mmol/L 3.4(L) 3.2(L) 3.2(L)  Chloride 98 - 111 mmol/L 98 102 103  CO2 22 - 32 mmol/L 22 24 23   Calcium 8.9 - 10.3 mg/dL 11.9(H) 12.9(H) 11.9(H)  Total Protein 6.5 - 8.1 g/dL 7.5 8.1 8.1  Total Bilirubin 0.3 - 1.2 mg/dL 3.3(H) 2.6(H) 1.6(H)  Alkaline Phos 38 - 126 U/L 110 142(H) 151(H)  AST 15 - 41 U/L 120(H) 100(H) 70(H)  ALT 0 - 44 U/L 54(H) 49(H) 35       RADIOGRAPHIC STUDIES: I have personally reviewed the radiological images as listed and agreed with the findings in the report. No images are attached to the encounter. NM Bone Scan Whole Body  Result Date:  06/26/2021 CLINICAL DATA:  Previous history of breast cancer, currently with elevated liver enzymes. Evaluate for osteoblastic metastatic disease. EXAM: NUCLEAR MEDICINE WHOLE BODY BONE SCAN TECHNIQUE: Whole body anterior and posterior images were obtained approximately 3 hours after intravenous injection of radiopharmaceutical. RADIOPHARMACEUTICALS:  21.5 mCi Technetium-74mMDP IV COMPARISON:  No prior bone scan. The only prior cross-sectional imaging is chest, abdomen and pelvis CT with contrast 06/12/2021. FINDINGS: There are small foci of mild increased activity in both anterior frontal bones which could be due to hyperostosis frontalis, cartilaginous rests, or small metastases, but the intensity of uptake would usually be greater with metastatic disease than is seen here. Mild activity consistent with degenerative arthrosis is noted in the [facility] sternoclavicular joints and feet.  Remainder of the axial skeleton and visualized appendicular skeleton do not show evidence of metastases. Soft tissue and renal activity are unremarkable. Slight thoracic levoscoliosis. IMPRESSION: 1. Small foci of mild increased activity in both anterior frontal bones. Differential diagnosis is hyperostosis frontalis interna, cartilaginous rests and metastases. 2. No other findings suspicious for osteoblastic metastatic disease. Electronically Signed   By: Telford Nab M.D.   On: 06/26/2021 21:26   NM PET Image Initial (PI) Skull Base To Thigh  Result Date: 07/05/2021 CLINICAL DATA:  Initial treatment strategy for left-sided breast cancer. Suspicion of metastatic disease based on laboratory values. EXAM: NUCLEAR MEDICINE PET SKULL BASE TO THIGH TECHNIQUE: 9.5 mCi F-18 FDG was injected intravenously. Full-ring PET imaging was performed from the skull base to thigh after the radiotracer. CT data was obtained and used for attenuation correction and anatomic localization. Fasting blood glucose: 84 mg/dl COMPARISON:  Neck CT  06/28/2021. Bone scan of 06/26/2021. Chest abdomen and pelvic CTs of 06/12/2021 FINDINGS: Mediastinal blood pool activity: SUV max 2.3 Liver activity: SUV max NA NECK: No cervical nodal hypermetabolism. Symmetric palatine tonsil hypermetabolism is favored to be physiologic. Incidental CT findings: Multiple small bilateral cervical nodes, none pathologic by size criteria. CHEST: And inferior left axillary node is somewhat rounded at 1.0 cm and corresponds to hypermetabolism at a S.U.V. max of 3.7 on 66/4. Other more cephalad bilateral axillary nodes maintain their fatty hila and demonstrate low-level hypermetabolism, including at up to a S.U.V. max of 2.9 on 59/4, favored to be benign. No pulmonary parenchymal hypermetabolism. Incidental CT findings: Deferred to recent diagnostic CT. Aortic atherosclerosis. Bilateral breast implants ABDOMEN/PELVIS: Diffuse moderate hepatic hypermetabolism which given hepatic morphology and clinical history is suspicious for "pseudo cirrhosis". Example diffuse heterogeneous hypermetabolism at a S.U.V. max of 7.5 within the right hepatic lobe on 110/4 and within the left hepatic lobe at a S.U.V. max of 9.0 on 130/4. No abdominopelvic nodal hypermetabolism. Perineal hypermetabolism at a S.U.V. max of 23.5 is without CT correlate favored to be related to urinary contamination. Incidental CT findings: Deferred to recent diagnostic CT. Similar trace perihepatic ascites. Portal venous hypertension with a recanalized paraumbilical vein. Small volume pelvic fluid, new. SKELETON: Mildly heterogeneously increased marrow activity throughout. Example in the right sacrum at a S.U.V. max of 4.6 and within the left humeral head at a S.U.V. max of 3.3. No CT correlates. Incidental CT findings: none IMPRESSION: 1. Relatively diffuse moderate hepatic hypermetabolism in the setting of apparent cirrhosis on CT. 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 Differential considerations include infiltrative hepatocellular carcinoma. Recommend tissue sampling. 2. Hypermetabolic left axillary node, more equivocal but suspicious for metastatic disease. 3. Mildly heterogeneously increased marrow activity, nonspecific. If the patient is eventually diagnosed with metastatic breast cancer, early osseous metastasis would be a concern. 4. Portal venous hypertension with slight increase in abdominopelvic fluid. 5. Perineal hypermetabolism which is favored to be related to urinary contamination. Consider physical exam correlation. Electronically Signed   By: Abigail Miyamoto M.D.   On: 07/05/2021 21:22   US BIOPSY (LIVER)  Result Date: 07/19/2021 INDICATION: History of breast cancer.  Concern for tumor replacement of liver. EXAM: ULTRASOUND GUIDED LIVER BIOPSY, NON-TARGETED COMPARISON:  CT chest abdomen pelvis, 06/12/2021. PET-CT, 07/06/2019 MEDICATIONS: None ANESTHESIA/SEDATION: Fentanyl 50 mcg IV; Versed 1 mg IV Total Moderate Sedation time: 20 minutes; The patient was continuously monitored during the procedure by the interventional radiology nurse under my direct supervision. COMPLICATIONS: None immediate. PROCEDURE:  Informed written consent was obtained from the patient after a discussion of the risks, benefits and alternatives to treatment. The patient understands and consents the procedure. A timeout was performed prior to the initiation of the procedure. Ultrasound scanning was performed of the right upper abdominal quadrant and the procedure was planned. The right upper abdomen was prepped and draped in the usual sterile fashion. The overlying soft tissues were anesthetized with 1% lidocaine with epinephrine. A 17 gauge, 6.8 cm co-axial needle was advanced into a peripheral aspect of the right lobe of the liver and 3 core biopsies were obtained with an 18 gauge core device under direct ultrasound guidance. The co-axial needle track was embolized with the  administration of a Gel-Foam slurry. Superficial hemostasis was obtained with manual compression. Post procedural scanning was negative for definitive area of hemorrhage. A dressing was placed. The patient tolerated the procedure well without immediate post procedural complication. FINDINGS: Heterogeneous hepatic parenchymal echotexture, with nodular contour. IMPRESSION: Successful ultrasound-guided liver biopsy, as above. Michaelle Birks, MD [facility] Electronically Signed   By: Michaelle Birks M.D.   On: 07/19/2021 16:14   CT PARATHYROID 4D NECK W/WO  Result Date: 06/28/2021 CLINICAL DATA:  Hyperparathyroidism.  History breast cancer. EXAM: CT NECK WITH AND WITHOUT CONTRAST TECHNIQUE: Multidetector CT imaging of the neck was performed without and with intravenous contrast. Three phase scanning in the neck, precontrast, arterial phase, and venous phase for detection of parathyroid adenoma. CONTRAST:  51m ISOVUE-370 IOPAMIDOL (ISOVUE-370) INJECTION 76% COMPARISON:  None. FINDINGS: Pharynx and larynx: Normal. No mass or swelling. Salivary glands: No inflammation, mass, or stone. Thyroid: Thyroid normal in size. 6 mm hypoenhancing nodule left lobe of thyroid. Small calcifications adjacent to the thyroid bilaterally. Enhancing soft tissue nodule posterior and superior to the left upper pole of the thyroid. This measures approximately 3 x 4 x 9 mm. This is best seen on arterial phase and washes out on venous phase. Reference image series 6, image 41. Possible parathyroid adenoma. Lymph nodes: No enlarged lymph nodes in the neck. Scattered small lymph nodes bilaterally. Vascular: Normal vascular enhancement. Limited intracranial: Not imaged Skeleton: Disc degeneration and spurring C5-6 and C6-7. No acute skeletal abnormality. Upper chest: Lung apices clear bilaterally. Other: None IMPRESSION: 1. 3 x 4 x 9 mm hyperenhancing soft tissue nodule posterior and  superior to the left lobe of the thyroid. Possible parathyroid adenoma. No other suspicious parathyroid adenoma identified 2. 6 mm hypoenhancing nodule left lobe of thyroid. No further imaging required. (Ref: J Am Coll Radiol. 2015 Feb;12(2): 143-50). Electronically Signed   By: CFranchot GalloM.D.   On: 06/28/2021 16:30     ASSESSMENT & PLAN: Patient is a 61y.o. female with history of breast cancer currently on letrozole and unclear etiology of hypercalcemia receiving zoledronic acid q12 weeks followed by oncologist Dr. GLindi Adie    #)Leukocytosis- WBC today is 20.0. This is down trending compared to recent labs x 3 days ago when it was 20.3. Patient recently taking prednisone as prescribed by PCP for sinus infection that turned into bronchitis.  She had 2 rounds of amoxicillin as well as Levaquin.  She finished prednisone 1 week ago.  #)Abdominal distention- Had recent liver biopsy 07/19/21. Has mild RUQ tenderness and mild tenderness over biopsy sites. No obvious infection of sites. No ecchymosis on abdomen or flank. Hypoactive bowel sounds. CMP shows T. Bili is elevated at 3.3 which is up compared to prior and slight increase in liver  enzymes as well. AST/ALT 120/54 compared to 4 days prior when it was 100/54. Overall exam, in addition to leukocytosis concerning for post procedure infection, also consider SBO with constipation and decreased flatus as well as gallbladder etiology with rising T. Bili and jaundice on exam.  #) Shortness of breath- Patient is tachypneic without hypoxia.  Vital signs show tachypnea ranging 119-122.  Lungs are clear to auscultation in all fields.  She does not have any associated chest pain.  CBC shows hemoglobin drop of 3 points compared to prior 3 days ago.  Baseline hemoglobin appears to be around 13 and today it is 10.5.  Platelets are within normal range.  #)Hypercalcemia- Unclear etiology currently undergoing workup for this. Calcium elevated today at 11.9. Patient's  last zometa injection was 07/18/21 and she reported no improvement in symptoms. Based on ongoing symptoms it does not seem beneficial to have zometa injection today based on complexity of symptoms.  #)Unsteady gait- Neuro exam without focal weakness. She has occasional difficulty word finding and slurred speech. Patient likely needs imaging of head to r/o mets.   Patient has multiple abnormal findings in clinic today both in labs and on physical exam.  Discussed with oncologist Dr. Lindi Adie who recommends ED evaluation for further work-up.  We feel patient needs head imaging to rule out mets as well as possible imaging of abdomen with concern of infection versus obstruction versus gallbladder etiology as well as additional labs all of which cannot be performed in a timely manner here in clinic.  Patient and sister agreeable with plan of care.  Patient transported to ED by myself and RN.  She is stable at time of transport.  Report given to provider in triage in the ED.  Visit Diagnosis: 1. Hypercalcemia   2. Abdominal distention   3. Leukocytosis, unspecified type   4. Shortness of breath      No orders of the defined types were placed in this encounter.   All questions were answered. The patient knows to call the clinic with any problems, questions or concerns. No barriers to learning was detected.  I have spent a total of 30 minutes minutes of face-to-face and non-face-to-face time, preparing to see the patient, obtaining and/or reviewing separately obtained history, performing a medically appropriate examination, counseling and educating the patient, ordering tests,  documenting clinical information in the electronic health record, and care coordination.     Thank you for allowing me to participate in the care of this patient.    Barrie Folk, PA-C Department of Hematology/Oncology [facility] at [facility] Phone: [phone]  [phone]     07/22/2021 2:41 PM

## 2021-07-22 NOTE — ED Provider Triage Note (Signed)
Emergency Medicine Provider Triage Evaluation Note  Christine Francis , a 61 y.o. female  was evaluated in triage.  Pt complains of abd pain, distension, sob, weakness/fatigue, HA and feeling off balance.  Pt sent from [place] center. Labs drawn their include CBC, CMP  Review of Systems  Positive: abd pain, distension, sob, weakness/fatigue, HA and feeling off balance Negative: Chest pain  Physical Exam  BP 124/83 (BP Location: Right Arm)    Pulse (!) 119    Temp 98 F (36.7 C) (Oral)    Resp 18    Ht 5\' 6"  (1.676 m)    Wt 85.7 kg    SpO2 98%    BMI 30.51 kg/m  Gen:   Awake, no distress   Resp:  Normal effort  MSK:   Moves extremities without difficulty  Other:  Tachycardic, diminished lung sounds in the bases, abd Is distended and TTP mostly to the RUQ, RLQ and right flank area.   Medical Decision Making  Medically screening exam initiated at 2:44 PM.  Appropriate orders placed.  Christine Francis was informed that the remainder of the evaluation will be completed by another provider, this initial triage assessment does not replace that evaluation, and the importance of remaining in the ED until their evaluation is complete.     Rodney Booze, [place] 07/22/21 1449

## 2021-07-22 NOTE — Progress Notes (Signed)
Referring Physician(s): Couture,C, PA-C  Supervising Physician: Arne Cleveland  Patient Status:  [facility]  Chief Complaint:  Right upper quadrant abdominal/back pain, occasional nausea, decreased appetite, constipation  Subjective: 61 y.o. female with PMH sig for anxiety, GERD, HLD, HTN, IBS, right breast cancer with recent recurrence in the left breast (lobular carcinoma). She has had bilat mastectomies with reconstruction. Recent PET scan has revealed:  1. Relatively diffuse moderate hepatic hypermetabolism in the setting of apparent cirrhosis on CT. 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 Differential considerations include infiltrative hepatocellular carcinoma. Recommend tissue sampling. 2. Hypermetabolic left axillary node, more equivocal but suspicious for metastatic disease. 3. Mildly heterogeneously increased marrow activity, nonspecific. If the patient is eventually diagnosed with metastatic breast cancer, early osseous metastasis would be a concern. 4. Portal venous hypertension with slight increase in abdominopelvic fluid. 5. Perineal hypermetabolism which is favored to be related to urinary contamination.   She has elevated PTH RP level, hypercalcemia and unclear cause for cirrhosis/concern for metastatic lobular breast cancer.  She underwent image guided random right liver biopsy on 07/19/2021 at [facility].  Following her observation period she was discharged home in stable condition.  On 1/7 patient began to notice some increasing lethargy, as well as right upper quadrant/back discomfort, diminished appetite, dyspnea and constipation.  Since patient's symptoms persisted through the weekend she presented today to [facility], ED. CT head was negative.  Chest x-ray with minimal left lingular subsegmental atelectasis or scarring.  CT angio abdomen pelvis today revealed: VASCULAR   1. Focal area of  contrast extravasation just lateral to the liver capsule in the right hepatic lobe. This location corresponds with the recent biopsy site. This area of contrast extravasation may be contained because it does not significantly enlarge on the delayed images. This could represent an elongated pseudoaneurysm along the capsule measuring roughly 1.6 cm. Dedicated ultrasound of this area with duplex imaging may better evaluate this area. 2.  Aortic Atherosclerosis (ICD10-I70.0). 3. Persistent enlarged umbilical vein. Finding raises concern for underlying portal hypertension.   NON-VASCULAR   1. Increased free fluid in the abdomen and pelvis compared to 07/05/2021. Hounsfield units are slightly dense but more dense than the previous PET-CT. Suspect this fluid represents a combination of blood and underlying ascites. 2. Again noted is a heterogeneous liver without a discrete lesion  Patient continues to have above symptoms along with mild headache, occasional cough, and some abdominal distention.  She is afebrile, BP okay but HR in the 120s.  O2 sat 98% room air.  Current labs include WBC 21.5, hemoglobin 10.2, platelets 173k, ammonia 76, potassium 3.4, creatinine 0.79, total bilirubin 3.3, latest PT/INR pending.  PT/INR was 15.5 and 1.2 on date of biopsy.  Biopsy path pending.  Gelfoam was also used along the needle track postbiopsy.  Request now received for possible hepatic angio with embolization.  Past Medical History:  Diagnosis Date   Allergy    allergy shots weekly   Anxiety    no meds currently   Asthma    Breast cancer ([place])    left - LCIS - bilat mastec   Cancer ([place])    skin- basil cancer face, chest and left leg--   GERD (gastroesophageal reflux disease)    Hyperlipidemia    diet controlled, no meds   Hypertension    IBS (irritable bowel syndrome)    no problems currently   Personal history of colonic adenomas 11/17/2005  11/17/2005 - 3 adenomas - one  A TV adenoma was 12  mm (max)   Past Surgical History:  Procedure Laterality Date   BIOPSY BREAST  2000   right; benign   BREAST BIOPSY  2012   right breast; pre cancerous   BREAST LUMPECTOMY Right 04/1999, 11/2009   BREAST LUMPECTOMY WITH RADIOACTIVE SEED LOCALIZATION Right 11/29/2014   Procedure: BREAST LUMPECTOMY WITH RADIOACTIVE SEED LOCALIZATION;  Surgeon: Donnie Mesa, MD;  Location: [place];  Service: General;  Laterality: Right;   BREAST RECONSTRUCTION WITH PLACEMENT OF TISSUE EXPANDER AND ALLODERM Bilateral 02/07/2019   Procedure: BILATERAL BREAST RECONSTRUCTION WITH PLACEMENT OF TISSUE EXPANDER AND ALLODERM;  Surgeon: [name], MD;  Location: [place];  Service: Plastics;  Laterality: Bilateral;   BREAST RECONSTRUCTION WITH PLACEMENT OF TISSUE EXPANDER AND ALLODERM Left 05/24/2019   Procedure: BREAST RECONSTRUCTION WITH PLACEMENT OF TISSUE EXPANDER AND ALLODERM;  Surgeon: [name], MD;  Location: [place];  Service: Plastics;  Laterality: Left;   BUNIONECTOMY  1986   bilateral   cervical cryosurgery  12/2013   COLONOSCOPY     x 2   DIAGNOSTIC LAPAROSCOPY     laproscopy  1998   LIPOSUCTION Bilateral 01/31/2020   Procedure: LIPOSUCTION BILATERAL LATERAL CHEST WALL;  Surgeon: [name], MD;  Location: [place];  Service: Plastics;  Laterality: Bilateral;   MASTECTOMY W/ SENTINEL NODE BIOPSY Bilateral 02/07/2019   Procedure: BILATERAL MASTECTOMIES WITH LEFT SENTINEL LYMPH NODE BIOPSY;  Surgeon: Donnie Mesa, MD;  Location: [place];  Service: General;  Laterality: Bilateral;  PEC BLOCK   PARATHYROIDECTOMY  2005   POLYPECTOMY     RE-EXCISION OF BREAST LUMPECTOMY Left 02/24/2019   Procedure: RE-EXCISION LEFT MASTECTOMY- LEFT DERMAL FLAP;  Surgeon: Donnie Mesa, MD;  Location: [place];  Service: General;  Laterality: Left;   REMOVAL OF BILATERAL TISSUE EXPANDERS WITH PLACEMENT OF BILATERAL BREAST IMPLANTS  Bilateral 01/31/2020   Procedure: REMOVAL OF BILATERAL TISSUE EXPANDERS WITH PLACEMENT OF BILATERAL SILICONE BREAST IMPLANTS;  Surgeon: [name], MD;  Location: [place];  Service: Plastics;  Laterality: Bilateral;   REMOVAL OF TISSUE EXPANDER AND PLACEMENT OF IMPLANT Left 02/24/2019   Procedure: REMOVAL OF LEFT CHEST TISSUE EXPANDER WITH ALLODERM;  Surgeon: [name], MD;  Location: [place];  Service: Plastics;  Laterality: Left;   TISSUE EXPANDER FILLING Right 02/24/2019   Procedure: RIGHT CHEST TISSUE EXPANDER FILLING;  Surgeon: [name], MD;  Location: [place];  Service: Plastics;  Laterality: Right;  (438ml 0.9% Normal Saline)   uterine bx     thickening of uterus was the reason for havin bx   WISDOM TOOTH EXTRACTION         Allergies: Ampicillin, Dilaudid [hydromorphone hcl], Erythromycin, Penicillins, Sulfa drugs cross reactors, and Declomycin [demeclocycline]  Medications: Prior to Admission medications   Medication Sig Start Date End Date Taking? Authorizing Provider  albuterol (PROVENTIL HFA;VENTOLIN HFA) 108 (90 BASE) MCG/ACT inhaler Inhale 1-2 puffs into the lungs every 6 (six) hours as needed for wheezing or shortness of breath.     [provider]  atenolol-chlorthalidone (TENORETIC) 50-25 MG per tablet Take 1 tablet by mouth daily.      [provider]  Calcium Carb-Cholecalciferol (CALCIUM 600+D3 PO) Take 2 tablets by mouth daily.    [provider]  Cholecalciferol (EQL VITAMIN D3) 50 MCG (2000 UT) CAPS Take 2,000 Units by mouth daily.    [provider]  EPINEPHrine 0.3 mg/0.3 mL IJ SOAJ injection Inject 0.3 mg into the muscle as needed for anaphylaxis.    [provider]  fluconazole (DIFLUCAN) 150 MG tablet Take 150 mg by mouth PRO. 07/04/21   [provider]  Fluorouracil (TOLAK) 4 % CREA Apply 1 application topically at bedtime as needed  (precancerous spots).    [provider]  fluticasone furoate-vilanterol (BREO ELLIPTA) 200-25 MCG/INH AEPB Inhale 1 puff into the lungs daily as needed (shortness of breath).    [provider]  letrozole (FEMARA) 2.5 MG tablet Take 1 tablet (2.5 mg total) by mouth daily. 11/27/20   Nicholas Lose, MD  levofloxacin (LEVAQUIN) 750 MG tablet Take 750 mg by mouth daily.    [provider]  montelukast (SINGULAIR) 10 MG tablet Take 10 mg by mouth daily.     [provider]  omeprazole (PRILOSEC OTC) 20 MG tablet Take 20 mg by mouth daily.    [provider]  Phenylephrine-DM-GG-APAP (MUCINEX SINUS-MAX) 5-10-200-325 MG TABS Take 2 tablets by mouth daily as needed (congestion).    [provider]  potassium chloride SA (KLOR-CON) 20 MEQ tablet Take 20 mEq by mouth daily.     [provider]  predniSONE (DELTASONE) 10 MG tablet Take 20-30 mg by mouth See admin instructions. Take 30 mg daily for 5 days then take 20 mg daily for 5 days    [provider]  sodium chloride (OCEAN) 0.65 % SOLN nasal spray Place 1 spray into both nostrils as needed for congestion (dryness).    [provider]     Vital Signs: BP 116/82    Pulse (!) 125    Temp 98 F (36.7 C) (Oral)    Resp (!) 21    Ht 5\' 6"  (1.676 m)    Wt 189 lb (85.7 kg)    SpO2 98%    BMI 30.51 kg/m   Physical Exam patient awake, answering questions appropriately.  Sister in room.  Chest with slightly diminished breath sounds bases.  Heart with tachycardic but regular rhythm.  Abdomen distended, few bowel sounds, tenderness right upper/lower quadrants and right lateral abdominal regions; no lower extremity edema.  Imaging: DG Chest 2 View  Result Date: 07/22/2021 CLINICAL DATA:  Shortness of breath after liver biopsy. EXAM: CHEST - 2 VIEW COMPARISON:  None. FINDINGS: The heart size and mediastinal contours are within normal limits. Right lung is clear. Minimal left lingular  subsegmental atelectasis or scarring is noted. The visualized skeletal structures are unremarkable. IMPRESSION: Minimal left lingular subsegmental atelectasis or scarring. Electronically Signed   By: Marijo Conception M.D.   On: 07/22/2021 15:12   CT Head Wo Contrast  Result Date: 07/22/2021 CLINICAL DATA:  Head trauma moderate to severe. Brain Mets suspected. EXAM: CT HEAD WITHOUT CONTRAST TECHNIQUE: Contiguous axial images were obtained from the base of the skull through the vertex without intravenous contrast. COMPARISON:  None. FINDINGS: Brain: No evidence of acute infarction, hemorrhage, hydrocephalus, extra-axial collection or mass lesion/mass effect. Vascular: No hyperdense vessel or unexpected calcification. Skull: Normal. Negative for fracture or focal lesion. Sinuses/Orbits: No acute finding. Other: None. IMPRESSION: No acute intracranial abnormality. No appreciable mass on this noncontrast enhanced examination. Electronically Signed   By: Keane Police D.O.   On: 07/22/2021 15:12   US BIOPSY (LIVER)  Result Date: 07/19/2021 INDICATION: History of breast cancer.  Concern for tumor replacement of liver. EXAM: ULTRASOUND GUIDED LIVER BIOPSY, NON-TARGETED COMPARISON:  CT chest abdomen pelvis, 06/12/2021. PET-CT,  07/06/2019 MEDICATIONS: None ANESTHESIA/SEDATION: Fentanyl 50 mcg IV; Versed 1 mg IV Total Moderate Sedation time: 20 minutes; The patient was continuously monitored during the procedure by the interventional radiology nurse under my direct supervision. COMPLICATIONS: None immediate. PROCEDURE: Informed written consent was obtained from the patient after a discussion of the risks, benefits and alternatives to treatment. The patient understands and consents the procedure. A timeout was performed prior to the initiation of the procedure. Ultrasound scanning was performed of the right upper abdominal quadrant and the procedure was planned. The right upper abdomen was prepped and draped in the usual  sterile fashion. The overlying soft tissues were anesthetized with 1% lidocaine with epinephrine. A 17 gauge, 6.8 cm co-axial needle was advanced into a peripheral aspect of the right lobe of the liver and 3 core biopsies were obtained with an 18 gauge core device under direct ultrasound guidance. The co-axial needle track was embolized with the administration of a Gel-Foam slurry. Superficial hemostasis was obtained with manual compression. Post procedural scanning was negative for definitive area of hemorrhage. A dressing was placed. The patient tolerated the procedure well without immediate post procedural complication. FINDINGS: Heterogeneous hepatic parenchymal echotexture, with nodular contour. IMPRESSION: Successful ultrasound-guided liver biopsy, as above. Michaelle Birks, MD [facility] Electronically Signed   By: Michaelle Birks M.D.   On: 07/19/2021 16:14   CT Angio Abd/Pel W and/or Wo Contrast  Result Date: 07/22/2021 CLINICAL DATA:  Recent liver biopsy. Free fluid on bedside ultrasound. Evaluate for liver bleed. EXAM: CT ANGIOGRAPHY ABDOMEN AND PELVIS WITH CONTRAST AND WITHOUT CONTRAST TECHNIQUE: Multidetector CT imaging of the abdomen and pelvis was performed using the standard protocol during bolus administration of intravenous contrast. Multiplanar reconstructed images and MIPs were obtained and reviewed to evaluate the vascular anatomy. CONTRAST:  15mL OMNIPAQUE IOHEXOL 350 MG/ML SOLN COMPARISON:  06/12/2021 FINDINGS: VASCULAR Aorta: Atherosclerotic calcifications in the abdominal aorta without aneurysm, dissection or significant stenosis. Celiac: Patent without evidence of aneurysm, dissection, vasculitis or significant stenosis. Variant anatomy with an accessory left hepatic artery coming off the left gastric artery. There is focal contrast extravasation just lateral to the right hepatic lobe on the arterial phase imaging on sequence 6, image  71. This is well seen on the coronal images, sequence 7, image 73. This is a relatively well-defined elongated structure on the arterial coronal arterial images, measuring up to 1.6 cm. This area does not significantly change in size on the delayed images and there is no clear evidence for contrast pooling outside of this isolated elongated structure. Finding is unusual but could represent apseudoaneurysm near the liver capsule. SMA: Patent without evidence of aneurysm, dissection, vasculitis or significant stenosis. Renals: Both renal arteries are patent without evidence of aneurysm, dissection, vasculitis, fibromuscular dysplasia or significant stenosis. IMA: Patent without evidence of aneurysm, dissection, vasculitis or significant stenosis. Inflow: Patent without evidence of aneurysm, dissection, vasculitis or significant stenosis. Proximal Outflow: Proximal femoral arteries are patent bilaterally. Veins: IVC and renal veins are patent. Main portal venous system appears to be patent but limited evaluation of intrahepatic portal veins. There is enlarged and umbilical vein along the anterior abdomen. Iliac veins are patent. Review of the MIP images confirms the above findings. NON-VASCULAR Lower chest: Bilateral breast implants. No pleural effusions. Patchy densities at lung bases likely represent atelectasis. Hepatobiliary: Liver is heterogeneous. No large discrete liver lesions. Normal appearance of the gallbladder. No significant biliary dilatation. Pancreas: Unremarkable. No pancreatic ductal dilatation or surrounding inflammatory changes.  Spleen: Normal in size without focal abnormality. Adrenals/Urinary Tract: Normal appearance of the adrenal glands. Normal appearance of the right kidney. Left renal cysts. Negative for hydronephrosis. Small amount of fluid in the urinary bladder. Stomach/Bowel: Normal appearance of the stomach. There is no evidence for bowel distention or focal bowel inflammation. Lymphatic:  No lymph node enlargement in the abdomen or pelvis. Reproductive: Uterus and bilateral adnexa are unremarkable. Other: Moderate amount of ascites in the abdomen and pelvis. Hounsfield units of the perihepatic ascites is relatively low density measuring between 11-13. Fluid in the cul-de-sac measures approximately 24. Most dense ascites is in the right lower quadrant measuring roughly 26 Hounsfield units. The ascites has increased from the PET-CT on 07/05/2021. Negative for free air. Musculoskeletal: No acute bone abnormality. IMPRESSION: VASCULAR 1. Focal area of contrast extravasation just lateral to the liver capsule in the right hepatic lobe. This location corresponds with the recent biopsy site. This area of contrast extravasation may be contained because it does not significantly enlarge on the delayed images. This could represent an elongated pseudoaneurysm along the capsule measuring roughly 1.6 cm. Dedicated ultrasound of this area with duplex imaging may better evaluate this area. 2.  Aortic Atherosclerosis (ICD10-I70.0). 3. Persistent enlarged umbilical vein. Finding raises concern for underlying portal hypertension. NON-VASCULAR 1. Increased free fluid in the abdomen and pelvis compared to 07/05/2021. Hounsfield units are slightly dense but more dense than the previous PET-CT. Suspect this fluid represents a combination of blood and underlying ascites. 2. Again noted is a heterogeneous liver without a discrete lesion. These results were called by telephone at the time of interpretation on 07/22/2021 at 4:28 pm to provider [facility] , who verbally acknowledged these results. Electronically Signed   By: Markus Daft M.D.   On: 07/22/2021 16:51    Labs:  CBC: Recent Labs    07/18/21 0917 07/19/21 1051 07/22/21 1117 07/22/21 1519  WBC 17.9* 20.3* 20.0* 21.5*  HGB 13.6 13.7 10.5* 10.2*  HCT 41.3 40.5 30.8* 30.3*  PLT 196 187 171 173    COAGS: Recent Labs    07/19/21 1051  INR 1.2     BMP: Recent Labs    06/28/21 1346 07/12/21 0937 07/18/21 0917 07/22/21 1117  NA 135 134* 134* 130*  K 3.2* 3.2* 3.2* 3.4*  CL 104 103 102 98  CO2 22 23 24 22   GLUCOSE 79 98 117* 120*  BUN 12 16 15 19   CALCIUM 11.5*   12.0* 11.9* 12.9* 11.9*  CREATININE 0.80 0.75 0.79 0.79  GFRNONAA >60 >60 >60 >60    LIVER FUNCTION TESTS: Recent Labs    06/28/21 1346 07/12/21 0937 07/18/21 0917 07/22/21 1117  BILITOT 2.5* 1.6* 2.6* 3.3*  AST 96* 70* 100* 120*  ALT 48* 35 49* 54*  ALKPHOS 157* 151* 142* 110  PROT 8.4* 8.1 8.1 7.5  ALBUMIN 3.2* 3.4* 3.4* 3.2*    Assessment and Plan: 61 y.o. female with PMH sig for anxiety, GERD, HLD, HTN, IBS, right breast cancer with recent recurrence in the left breast (lobular carcinoma). She has had bilat mastectomies with reconstruction. Recent PET scan has revealed:  1. Relatively diffuse moderate hepatic hypermetabolism in the setting of apparent cirrhosis on CT. 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 Differential considerations include infiltrative hepatocellular carcinoma. Recommend tissue sampling. 2. Hypermetabolic left axillary node, more equivocal but suspicious for metastatic disease. 3. Mildly heterogeneously increased marrow activity, nonspecific. If the patient is eventually diagnosed with  metastatic breast cancer, early osseous metastasis would be a concern. 4. Portal venous hypertension with slight increase in abdominopelvic fluid. 5. Perineal hypermetabolism which is favored to be related to urinary contamination.   She has elevated PTH RP level, hypercalcemia and unclear cause for cirrhosis/concern for metastatic lobular breast cancer.  She underwent image guided random right liver biopsy on 07/19/2021 at [facility].  Following her observation period she was discharged home in stable condition.  On 1/7 patient began to notice some increasing lethargy,  as well as right upper quadrant/back discomfort, diminished appetite, dyspnea and constipation.  Since patient's symptoms persisted through the weekend she presented today to [facility], ED. CT head was negative.  Chest x-ray with minimal left lingular subsegmental atelectasis or scarring.  CT angio abdomen pelvis today revealed: VASCULAR   1. Focal area of contrast extravasation just lateral to the liver capsule in the right hepatic lobe. This location corresponds with the recent biopsy site. This area of contrast extravasation may be contained because it does not significantly enlarge on the delayed images. This could represent an elongated pseudoaneurysm along the capsule measuring roughly 1.6 cm. Dedicated ultrasound of this area with duplex imaging may better evaluate this area. 2.  Aortic Atherosclerosis (ICD10-I70.0). 3. Persistent enlarged umbilical vein. Finding raises concern for underlying portal hypertension.   NON-VASCULAR   1. Increased free fluid in the abdomen and pelvis compared to 07/05/2021. Hounsfield units are slightly dense but more dense than the previous PET-CT. Suspect this fluid represents a combination of blood and underlying ascites. 2. Again noted is a heterogeneous liver without a discrete lesion  Patient continues to have above symptoms along with mild headache, occasional cough, and some abdominal distention.  She is afebrile, BP okay but HR in the 120s.  O2 sat 98% room air.  Current labs include WBC 21.5, hemoglobin 10.2, platelets 173k, ammonia 76, potassium 3.4, creatinine 0.79, total bilirubin 3.3, latest PT/INR pending.  PT/INR was 15.5 and 1.2 on date of biopsy.  Biopsy path pending.  Gelfoam was also used along the needle track postbiopsy.  Request now received for possible hepatic angio with embolization.  Imaging studies have been reviewed by Drs. Henn/El Abd/Hassell. Risks and benefits of procedure were discussed with the patient/sister including,  but not limited to bleeding, infection, vascular injury or contrast induced renal failure.  This interventional procedure involves the use of X-rays and because of the nature of the planned procedure, it is possible that we will have prolonged use of X-ray fluoroscopy.  Potential radiation risks to you include (but are not limited to) the following: - A slightly elevated risk for cancer  several years later in life. This risk is typically less than 0.5% percent. This risk is low in comparison to the normal incidence of human cancer, which is 33% for women and 50% for men according to the [place]. - Radiation induced injury can include skin redness, resembling a rash, tissue breakdown / ulcers and hair loss (which can be temporary or permanent).   The likelihood of either of these occurring depends on the difficulty of the procedure and whether you are sensitive to radiation due to previous procedures, disease, or genetic conditions.   IF your procedure requires a prolonged use of radiation, you will be notified and given written instructions for further action.  It is your responsibility to monitor the irradiated area for the 2 weeks following the procedure and to notify your physician if you are concerned that  you have suffered a radiation induced injury.    All of the patient's questions were answered, patient is agreeable to proceed.  Consent signed and in chart.  Plan for procedure this evening    Electronically Signed: D. Rowe Robert, PA-C 07/22/2021, 4:59 PM   I spent a total of 25 minutes at the the patient's bedside AND on the patient's hospital floor or unit, greater than 50% of which was counseling/coordinating care for hepatic arteriogram with possible embolization    Patient ID: Christine Francis, female   DOB: 21-Jan-1961, 61 y.o.   MRN: [id number]

## 2021-07-22 NOTE — Procedures (Signed)
°  Procedure: Hepatic arteriogram and subselective coil embolization   EBL:   minimal Complications:  none immediate  See full dictation in [facility].  Dillard Cannon MD Main # [phone] Pager  [phone] Mobile [phone]

## 2021-07-22 NOTE — Progress Notes (Signed)
A consult was received from an ED physician for vancomycin & cefepime & flagyl per pharmacy dosing.  The patient's profile has been reviewed for ht/wt/allergies/indication/available labs.   PCN = hives. Has had cefazolin PTA  A one time order has been placed for vancomycin 1750 mg IV x 1 & cefepime 2 gm IV x 1 and metronidazole 500 mg IV x 1   Further antibiotics/pharmacy consults should be ordered by admitting physician if indicated.                       Thank you,  Eudelia Bunch, Pharm.D 07/22/2021 4:36 PM

## 2021-07-22 NOTE — Progress Notes (Signed)
Received call from pt with complaint of fatigue, headache, and unsteady gait x 3 days.  Pt states symptoms typically arise when calcium is high.  Per MD pt needing labs and to f/u with [facility] for further evaluation.  Appt scheduled and pt verbalized understanding.

## 2021-07-22 NOTE — H&P (Addendum)
History and Physical    Christine Francis IOX:[id number] DOB: 30-Dec-1960 DOA: 07/22/2021  PCP: Molli Posey, MD   Patient coming from:  Home  I have personally briefly reviewed patient's old medical records in [place]  Chief Complaint:  Abdominal pain at Liver biopsy site.  HPI: Christine Francis is a 61 y.o. female with PMH significant for left breast cancer s/p bilateral mastectomy on letrozole therapy, anxiety disorder, hypercalcemia, essential hypertension who was sent from cancer center with multiple complaints.  Patient was found to have liver lesions and  she underwent liver biopsy 3 days ago by IR.  Patient has developed pain around the biopsy area, describes pain as burning, 8 /10 on pain scale associated with fatigue, generalized weakness and tiredness.  Patient has been following up with oncology and getting Zometa for hypercalcemia.  She went to see oncologist today and was found to have leukocytosis,  worsening abdominal distention and slight confusion.  Patient denies any fever, chills but reports mild cough, denies chest pain, dizziness, palpitations, recent travel, sick contacts.  ED Course: She was tachycardic, tachypneic, hypotensive, afebrile with normal SPO2. Vitals: Temp 98.4, HR 125, RR 21, BP 109/89, SPO2 98% on room air Labs include WBC 21.5, hemoglobin 10.2, hematocrit 30.3, MCV 89.9, platelets 173, sodium 130, potassium 3.4, chloride 98, bicarb 22, glucose 120, BUN 19, creatinine 0.79, calcium 11.9, anion gap 10, alkaline phosphatase 110, albumin 3.2, AST 120, ALT 54, total protein 7.5, ammonia 76, total bilirubin 3.3, BNP 62, troponin 59, respiratory panel pending. CT head no acute intracranial abnormality are no brain lesions. X-ray Chest: Minimal left lingular subsegmental atelectasis or scarring. CT abdomen pelvis: Focal area of contrast extravasation just lateral to the liver capsule in the right hepatic lobe. This location corresponds with the recent  biopsy site.  This could represent pseudoaneurysm.Increased free fluid in the abdomen and pelvis compared to 07/05/2021.   Review of Systems:  Review of Systems  Constitutional:  Positive for malaise/fatigue.  HENT: Negative.    Eyes: Negative.   Respiratory:  Positive for cough and shortness of breath.   Cardiovascular: Negative.   Gastrointestinal: Negative.   Genitourinary: Negative.   Musculoskeletal:  Positive for back pain.  Skin: Negative.   Neurological:  Positive for weakness.  Endo/Heme/Allergies: Negative.   Psychiatric/Behavioral: Negative.      Past Medical History:  Diagnosis Date   Allergy    allergy shots weekly   Anxiety    no meds currently   Asthma    Breast cancer ([place])    left - LCIS - bilat mastec   Cancer ([place])    skin- basil cancer face, chest and left leg--   GERD (gastroesophageal reflux disease)    Hyperlipidemia    diet controlled, no meds   Hypertension    IBS (irritable bowel syndrome)    no problems currently   Personal history of colonic adenomas 11/17/2005   11/17/2005 - 3 adenomas - one  A TV adenoma was 12 mm (max)    Past Surgical History:  Procedure Laterality Date   BIOPSY BREAST  2000   right; benign   BREAST BIOPSY  2012   right breast; pre cancerous   BREAST LUMPECTOMY Right 04/1999, 11/2009   BREAST LUMPECTOMY WITH RADIOACTIVE SEED LOCALIZATION Right 11/29/2014   Procedure: BREAST LUMPECTOMY WITH RADIOACTIVE SEED LOCALIZATION;  Surgeon: Donnie Mesa, MD;  Location: [place];  Service: General;  Laterality: Right;   BREAST RECONSTRUCTION WITH PLACEMENT OF TISSUE EXPANDER  AND ALLODERM Bilateral 02/07/2019   Procedure: BILATERAL BREAST RECONSTRUCTION WITH PLACEMENT OF TISSUE EXPANDER AND ALLODERM;  Surgeon: [name], MD;  Location: [place];  Service: Plastics;  Laterality: Bilateral;   BREAST RECONSTRUCTION WITH PLACEMENT OF TISSUE EXPANDER AND ALLODERM Left 05/24/2019   Procedure: BREAST RECONSTRUCTION  WITH PLACEMENT OF TISSUE EXPANDER AND ALLODERM;  Surgeon: [name], MD;  Location: [place];  Service: Plastics;  Laterality: Left;   BUNIONECTOMY  1986   bilateral   cervical cryosurgery  12/2013   COLONOSCOPY     x 2   DIAGNOSTIC LAPAROSCOPY     laproscopy  1998   LIPOSUCTION Bilateral 01/31/2020   Procedure: LIPOSUCTION BILATERAL LATERAL CHEST WALL;  Surgeon: [name], MD;  Location: [place];  Service: Plastics;  Laterality: Bilateral;   MASTECTOMY W/ SENTINEL NODE BIOPSY Bilateral 02/07/2019   Procedure: BILATERAL MASTECTOMIES WITH LEFT SENTINEL LYMPH NODE BIOPSY;  Surgeon: Donnie Mesa, MD;  Location: [place];  Service: General;  Laterality: Bilateral;  PEC BLOCK   PARATHYROIDECTOMY  2005   POLYPECTOMY     RE-EXCISION OF BREAST LUMPECTOMY Left 02/24/2019   Procedure: RE-EXCISION LEFT MASTECTOMY- LEFT DERMAL FLAP;  Surgeon: Donnie Mesa, MD;  Location: [place];  Service: General;  Laterality: Left;   REMOVAL OF BILATERAL TISSUE EXPANDERS WITH PLACEMENT OF BILATERAL BREAST IMPLANTS Bilateral 01/31/2020   Procedure: REMOVAL OF BILATERAL TISSUE EXPANDERS WITH PLACEMENT OF BILATERAL SILICONE BREAST IMPLANTS;  Surgeon: [name], MD;  Location: [place];  Service: Plastics;  Laterality: Bilateral;   REMOVAL OF TISSUE EXPANDER AND PLACEMENT OF IMPLANT Left 02/24/2019   Procedure: REMOVAL OF LEFT CHEST TISSUE EXPANDER WITH ALLODERM;  Surgeon: [name], MD;  Location: [place];  Service: Plastics;  Laterality: Left;   TISSUE EXPANDER FILLING Right 02/24/2019   Procedure: RIGHT CHEST TISSUE EXPANDER FILLING;  Surgeon: [name], MD;  Location: [place];  Service: Plastics;  Laterality: Right;  (417ml 0.9% Normal Saline)   uterine bx     thickening of uterus was the reason for havin bx   WISDOM TOOTH EXTRACTION       reports that she has never  smoked. She has never used smokeless tobacco. She reports current alcohol use of about 1.0 standard drink per week. She reports that she does not use drugs.  Allergies  Allergen Reactions   Ampicillin Hives   Dilaudid [Hydromorphone Hcl] Nausea And Vomiting   Erythromycin Hives and Other (See Comments)   Penicillins Hives   Sulfa Drugs Cross Reactors Hives   Declomycin [Demeclocycline] Rash and Other (See Comments)    Childhood allergy    Family History  Problem Relation Age of Onset   Hypertension Father    Cancer Father        melanoma/skin cancer   Colon polyps Father    Colon cancer Neg Hx    Rectal cancer Neg Hx    Stomach cancer Neg Hx    Esophageal cancer Neg Hx    Family history reviewed and not pertinent.  Prior to Admission medications   Medication Sig Start Date End Date Taking? Authorizing Provider  albuterol (PROVENTIL HFA;VENTOLIN HFA) 108 (90 BASE) MCG/ACT inhaler Inhale 1-2 puffs into the lungs every 6 (six) hours as needed for wheezing or shortness of breath.     [provider]  atenolol-chlorthalidone (TENORETIC) 50-25 MG per tablet Take 1 tablet by mouth daily.      [provider]  Calcium Carb-Cholecalciferol (CALCIUM 600+D3 PO) Take 2 tablets by mouth daily.    [provider]  Cholecalciferol (EQL VITAMIN D3) 50 MCG (2000 UT) CAPS Take 2,000 Units by mouth daily.    [provider]  EPINEPHrine 0.3 mg/0.3 mL IJ SOAJ injection Inject 0.3 mg into the muscle as needed for anaphylaxis.    [provider]  fluconazole (DIFLUCAN) 150 MG tablet Take 150 mg by mouth PRO. 07/04/21   [provider]  Fluorouracil (TOLAK) 4 % CREA Apply 1 application topically at bedtime as needed (precancerous spots).    [provider]  fluticasone furoate-vilanterol (BREO ELLIPTA) 200-25 MCG/INH AEPB Inhale 1 puff into the lungs daily as needed (shortness of breath).    [provider]  letrozole (FEMARA) 2.5  MG tablet Take 1 tablet (2.5 mg total) by mouth daily. 11/27/20   Nicholas Lose, MD  levofloxacin (LEVAQUIN) 750 MG tablet Take 750 mg by mouth daily.    [provider]  montelukast (SINGULAIR) 10 MG tablet Take 10 mg by mouth daily.     [provider]  omeprazole (PRILOSEC OTC) 20 MG tablet Take 20 mg by mouth daily.    [provider]  Phenylephrine-DM-GG-APAP (MUCINEX SINUS-MAX) 5-10-200-325 MG TABS Take 2 tablets by mouth daily as needed (congestion).    [provider]  potassium chloride SA (KLOR-CON) 20 MEQ tablet Take 20 mEq by mouth daily.     [provider]  predniSONE (DELTASONE) 10 MG tablet Take 20-30 mg by mouth See admin instructions. Take 30 mg daily for 5 days then take 20 mg daily for 5 days    [provider]  sodium chloride (OCEAN) 0.65 % SOLN nasal spray Place 1 spray into both nostrils as needed for congestion (dryness).    [provider]    Physical Exam: Vitals:   07/22/21 1530 07/22/21 1603 07/22/21 1630 07/22/21 1700  BP: 111/67 119/76 116/82 (!) 109/97  Pulse: (!) 120 (!) 114 (!) 125 (!) 114  Resp: 16 (!) 24 (!) 21 16  Temp:      TempSrc:      SpO2: 98% 97% 98% 98%  Weight:      Height:        Constitutional: Appears comfortable, not in any acute distress.  Deconditioned Vitals:   07/22/21 1530 07/22/21 1603 07/22/21 1630 07/22/21 1700  BP: 111/67 119/76 116/82 (!) 109/97  Pulse: (!) 120 (!) 114 (!) 125 (!) 114  Resp: 16 (!) 24 (!) 21 16  Temp:      TempSrc:      SpO2: 98% 97% 98% 98%  Weight:      Height:       Eyes: PERRL, lids and conjunctivae normal ENMT: Mucous membranes are moist.  Posterior pharynx without exudate..Normal dentition.  Neck: normal, supple, no masses, no thyromegaly Respiratory: Clear to auscultation bilaterally, no wheezing, no crackles, no accessory muscle use.  RR 21 Cardiovascular: Regular rate and rhythm, S1-S2 heard, no murmur.   Abdomen: Soft, distended,  tenderness noted in RUQ around biopsy location, bruising noted.  Fluid thrill+ Musculoskeletal: No edema, no cyanosis, no clubbing.  Good ROM, no contractures. Normal muscle tone.  Skin: no rashes, lesions, ulcers. No induration Neurologic: CN 2-12 grossly intact. Sensation intact, DTR normal. Strength 5/5 in all 4.  Psychiatric: Normal judgment and insight. Alert and oriented x 3. Normal mood.    Labs on Admission: I have personally reviewed following labs and imaging studies  CBC: Recent Labs  Lab 07/18/21 0917 07/19/21 1051 07/22/21 1117 07/22/21 1519  WBC 17.9* 20.3* 20.0* 21.5*  NEUTROABS 11.6*  --  13.5* 14.6*  HGB 13.6 13.7 10.5* 10.2*  HCT 41.3 40.5 30.8* 30.3*  MCV 88.4 88.4 87.3 89.9  PLT 196 187 171 182   Basic Metabolic Panel: Recent Labs  Lab 07/18/21 0917 07/22/21 1117  NA 134* 130*  K 3.2* 3.4*  CL 102 98  CO2 24 22  GLUCOSE 117* 120*  BUN 15 19  CREATININE 0.79 0.79  CALCIUM 12.9* 11.9*   GFR: Estimated Creatinine Clearance: 80.5 mL/min (by C-G formula based on SCr of 0.79 mg/dL). Liver Function Tests: Recent Labs  Lab 07/18/21 0917 07/22/21 1117  AST 100* 120*  ALT 49* 54*  ALKPHOS 142* 110  BILITOT 2.6* 3.3*  PROT 8.1 7.5  ALBUMIN 3.4* 3.2*   No results for input(s): LIPASE, AMYLASE in the last 168 hours. Recent Labs  Lab 07/22/21 1447  AMMONIA 76*   Coagulation Profile: Recent Labs  Lab 07/19/21 1051  INR 1.2   Cardiac Enzymes: No results for input(s): CKTOTAL, CKMB, CKMBINDEX, TROPONINI in the last 168 hours. BNP (last 3 results) No results for input(s): PROBNP in the last 8760 hours. HbA1C: No results for input(s): HGBA1C in the last 72 hours. CBG: No results for input(s): GLUCAP in the last 168 hours. Lipid Profile: No results for input(s): CHOL, HDL, LDLCALC, TRIG, CHOLHDL, LDLDIRECT in the last 72 hours. Thyroid Function Tests: No results for input(s): TSH, T4TOTAL, FREET4, T3FREE, THYROIDAB in the last 72  hours. Anemia Panel: No results for input(s): VITAMINB12, FOLATE, FERRITIN, TIBC, IRON, RETICCTPCT in the last 72 hours. Urine analysis: No results found for: COLORURINE, APPEARANCEUR, LABSPEC, [place], GLUCOSEU, [place], BILIRUBINUR, KETONESUR, PROTEINUR, UROBILINOGEN, NITRITE, LEUKOCYTESUR  Radiological Exams on Admission: DG Chest 2 View  Result Date: 07/22/2021 CLINICAL DATA:  Shortness of breath after liver biopsy. EXAM: CHEST - 2 VIEW COMPARISON:  None. FINDINGS: The heart size and mediastinal contours are within normal limits. Right lung is clear. Minimal left lingular subsegmental atelectasis or scarring is noted. The visualized skeletal structures are unremarkable. IMPRESSION: Minimal left lingular subsegmental atelectasis or scarring. Electronically Signed   By: Marijo Conception M.D.   On: 07/22/2021 15:12   CT Head Wo Contrast  Result Date: 07/22/2021 CLINICAL DATA:  Head trauma moderate to severe. Brain Mets suspected. EXAM: CT HEAD WITHOUT CONTRAST TECHNIQUE: Contiguous axial images were obtained from the base of the skull through the vertex without intravenous contrast. COMPARISON:  None. FINDINGS: Brain: No evidence of acute infarction, hemorrhage, hydrocephalus, extra-axial collection or mass lesion/mass effect. Vascular: No hyperdense vessel or unexpected calcification. Skull: Normal. Negative for fracture or focal lesion. Sinuses/Orbits: No acute finding. Other: None. IMPRESSION: No acute intracranial abnormality. No appreciable mass on this noncontrast enhanced examination. Electronically Signed   By: Keane Police D.O.   On: 07/22/2021 15:12   CT Angio Abd/Pel W and/or Wo Contrast  Result Date: 07/22/2021 CLINICAL DATA:  Recent liver biopsy. Free fluid on bedside ultrasound. Evaluate for liver bleed. EXAM: CT ANGIOGRAPHY ABDOMEN AND PELVIS WITH CONTRAST AND WITHOUT CONTRAST TECHNIQUE: Multidetector CT imaging of the abdomen and pelvis was performed using the standard protocol during  bolus administration of intravenous contrast. Multiplanar reconstructed images and MIPs were obtained and reviewed to evaluate the vascular anatomy. CONTRAST:  171mL OMNIPAQUE IOHEXOL 350 MG/ML SOLN COMPARISON:  06/12/2021 FINDINGS: VASCULAR Aorta: Atherosclerotic calcifications in the abdominal aorta without aneurysm, dissection or significant stenosis. Celiac: Patent without evidence  of aneurysm, dissection, vasculitis or significant stenosis. Variant anatomy with an accessory left hepatic artery coming off the left gastric artery. There is focal contrast extravasation just lateral to the right hepatic lobe on the arterial phase imaging on sequence 6, image 71. This is well seen on the coronal images, sequence 7, image 73. This is a relatively well-defined elongated structure on the arterial coronal arterial images, measuring up to 1.6 cm. This area does not significantly change in size on the delayed images and there is no clear evidence for contrast pooling outside of this isolated elongated structure. Finding is unusual but could represent apseudoaneurysm near the liver capsule. SMA: Patent without evidence of aneurysm, dissection, vasculitis or significant stenosis. Renals: Both renal arteries are patent without evidence of aneurysm, dissection, vasculitis, fibromuscular dysplasia or significant stenosis. IMA: Patent without evidence of aneurysm, dissection, vasculitis or significant stenosis. Inflow: Patent without evidence of aneurysm, dissection, vasculitis or significant stenosis. Proximal Outflow: Proximal femoral arteries are patent bilaterally. Veins: IVC and renal veins are patent. Main portal venous system appears to be patent but limited evaluation of intrahepatic portal veins. There is enlarged and umbilical vein along the anterior abdomen. Iliac veins are patent. Review of the MIP images confirms the above findings. NON-VASCULAR Lower chest: Bilateral breast implants. No pleural effusions. Patchy  densities at lung bases likely represent atelectasis. Hepatobiliary: Liver is heterogeneous. No large discrete liver lesions. Normal appearance of the gallbladder. No significant biliary dilatation. Pancreas: Unremarkable. No pancreatic ductal dilatation or surrounding inflammatory changes. Spleen: Normal in size without focal abnormality. Adrenals/Urinary Tract: Normal appearance of the adrenal glands. Normal appearance of the right kidney. Left renal cysts. Negative for hydronephrosis. Small amount of fluid in the urinary bladder. Stomach/Bowel: Normal appearance of the stomach. There is no evidence for bowel distention or focal bowel inflammation. Lymphatic: No lymph node enlargement in the abdomen or pelvis. Reproductive: Uterus and bilateral adnexa are unremarkable. Other: Moderate amount of ascites in the abdomen and pelvis. Hounsfield units of the perihepatic ascites is relatively low density measuring between 11-13. Fluid in the cul-de-sac measures approximately 24. Most dense ascites is in the right lower quadrant measuring roughly 26 Hounsfield units. The ascites has increased from the PET-CT on 07/05/2021. Negative for free air. Musculoskeletal: No acute bone abnormality. IMPRESSION: VASCULAR 1. Focal area of contrast extravasation just lateral to the liver capsule in the right hepatic lobe. This location corresponds with the recent biopsy site. This area of contrast extravasation may be contained because it does not significantly enlarge on the delayed images. This could represent an elongated pseudoaneurysm along the capsule measuring roughly 1.6 cm. Dedicated ultrasound of this area with duplex imaging may better evaluate this area. 2.  Aortic Atherosclerosis (ICD10-I70.0). 3. Persistent enlarged umbilical vein. Finding raises concern for underlying portal hypertension. NON-VASCULAR 1. Increased free fluid in the abdomen and pelvis compared to 07/05/2021. Hounsfield units are slightly dense but more  dense than the previous PET-CT. Suspect this fluid represents a combination of blood and underlying ascites. 2. Again noted is a heterogeneous liver without a discrete lesion. These results were called by telephone at the time of interpretation on 07/22/2021 at 4:28 pm to provider MADISON [facility] , who verbally acknowledged these results. Electronically Signed   By: Markus Daft M.D.   On: 07/22/2021 16:51    EKG: Independently reviewed.  Sinus tachycardia, supraventricular bigeminy.  Assessment/Plan Principal Problem:   Severe sepsis with acute organ dysfunction [facility] Active Problems:   Malignant neoplasm of upper-outer quadrant  of left breast in female, estrogen receptor positive ([place])   Anxiety   S/P bilateral mastectomy   Hypercalcemia   Severe sepsis unknown source: Patient presented with tachycardia, tachypnea,hypotension, lactic acidosis, leukocytosis.   Suspect source is abdominal, malignant ascites, SBP. CT abdomen showed increased free fluid in the abdomen and pelvis. Recent liver biopsy complicated by possible pseudoaneurysm. Continue empiric antibiotics (vancomycin, cefepime and Flagyl) Follow blood cultures, UA pending. follow-up urine culture. Consider paracentesis if patient becomes symptomatic. IR is consulted, recommended to hold on paracentesis unless symptomatic. Continue gentle IV hydration.  Hyperammoniemia: Ammonia level 76.  Patient had slight confusion but more awake and alert now Start lactulose every 8 hours.  Ascitis/ Abdominal Distension: Patient presented with significant abdominal distention, ascites,  tenderness. Continue empiric antibiotics. Follow-up IR for timing for paracentesis. Continue lactulose.  Pseudoaneurysm: Liver biopsy complicated by pseudoaneurysm. Patient was evaluated by IR recommended H&H every 6 hours. Hemoglobin is stable, may need IR embolization if she continues to bleed. Adequate pain control.  Hx. 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 Patient is on letrozole therapy.  Dr. Alvy Bimler is notified.  Hypercalcemia: Patient is following with oncology and getting Zometa for hypercalcemia Serum calcium 11.9.  Continue IV hydration. Continue to monitor serum calcium.  Leucocytosis: Leukocytosis could be from sepsis, possible source Ascitis. Continue antibiotics, continue to monitor white cell  Elevated liver enzymes: This could be due to liver mets, continue to monitor liver enzymes.  Elevated Trop: Patient denies any chest pain, EKG sinus tachycardia. This could be due to demand ischemia.  Continue to trend troponin.  Essential hypertension: Resume blood pressure medications once blood pressure improves.  GERD: Continue pantoprazole.   DVT prophylaxis: SCDs Code Status: Full code. Family Communication: No family at bed side. Disposition Plan:   Status is: Inpatient  Remains inpatient appropriate because:  Admitted for severe sepsis, unknown source.  Consults called: Oncology, Alvy Bimler, [place] Admission status: Inpatient / Stepdown.   Shawna Clamp MD [facility]   If 7PM-7AM, please contact night-coverage   07/22/2021, 5:47 PM

## 2021-07-22 NOTE — ED Notes (Signed)
Patient transported to CT 

## 2021-07-23 DIAGNOSIS — A419 Sepsis, unspecified organism: Secondary | ICD-10-CM | POA: Diagnosis not present

## 2021-07-23 DIAGNOSIS — R6520 Severe sepsis without septic shock: Secondary | ICD-10-CM | POA: Diagnosis not present

## 2021-07-23 LAB — CBC
HCT: 26.1 % — ABNORMAL LOW (ref 36.0–46.0)
HCT: 25.9 % — ABNORMAL LOW (ref 36.0–46.0)
Hemoglobin: 8.4 g/dL — ABNORMAL LOW (ref 12.0–15.0)
Hemoglobin: 8.1 g/dL — ABNORMAL LOW (ref 12.0–15.0)
MCH: 29.8 pg (ref 26.0–34.0)
MCH: 30.5 pg (ref 26.0–34.0)
MCHC: 32.2 g/dL (ref 30.0–36.0)
MCHC: 31.3 g/dL (ref 30.0–36.0)
MCV: 92.6 fL (ref 80.0–100.0)
MCV: 97.4 fL (ref 80.0–100.0)
Platelets: 173 10*3/uL (ref 150–400)
Platelets: 181 K/uL (ref 150–400)
RBC: 2.82 MIL/uL — ABNORMAL LOW (ref 3.87–5.11)
RBC: 2.66 MIL/uL — ABNORMAL LOW (ref 3.87–5.11)
RDW: 16.6 % — ABNORMAL HIGH (ref 11.5–15.5)
RDW: 17 % — ABNORMAL HIGH (ref 11.5–15.5)
WBC: 24.6 10*3/uL — ABNORMAL HIGH (ref 4.0–10.5)
WBC: 26.6 K/uL — ABNORMAL HIGH (ref 4.0–10.5)
nRBC: 0.2 % (ref 0.0–0.2)
nRBC: 0.4 % — ABNORMAL HIGH (ref 0.0–0.2)

## 2021-07-23 LAB — COMPREHENSIVE METABOLIC PANEL
ALT: 56 U/L — ABNORMAL HIGH (ref 0–44)
AST: 126 U/L — ABNORMAL HIGH (ref 15–41)
Albumin: 2.7 g/dL — ABNORMAL LOW (ref 3.5–5.0)
Alkaline Phosphatase: 91 U/L (ref 38–126)
Anion gap: 11 (ref 5–15)
BUN: 25 mg/dL — ABNORMAL HIGH (ref 8–23)
CO2: 19 mmol/L — ABNORMAL LOW (ref 22–32)
Calcium: 10.9 mg/dL — ABNORMAL HIGH (ref 8.9–10.3)
Chloride: 99 mmol/L (ref 98–111)
Creatinine, Ser: 0.91 mg/dL (ref 0.44–1.00)
GFR, Estimated: >60 mL/min (ref >60)
Glucose, Bld: 139 mg/dL — ABNORMAL HIGH (ref 70–99)
Potassium: 3.6 mmol/L (ref 3.5–5.1)
Sodium: 129 mmol/L — ABNORMAL LOW (ref 135–145)
Total Bilirubin: 3.4 mg/dL — ABNORMAL HIGH (ref 0.3–1.2)
Total Protein: 6.9 g/dL (ref 6.5–8.1)

## 2021-07-23 LAB — URINALYSIS, ROUTINE W REFLEX MICROSCOPIC
Bilirubin Urine: NEGATIVE
Glucose, UA: NEGATIVE mg/dL
Ketones, ur: NEGATIVE mg/dL
Leukocytes,Ua: NEGATIVE
Nitrite: NEGATIVE
Protein, ur: NEGATIVE mg/dL
Specific Gravity, Urine: 1.015 (ref 1.005–1.030)
pH: 6 (ref 5.0–8.0)

## 2021-07-23 LAB — MAGNESIUM: Magnesium: 2 mg/dL (ref 1.7–2.4)

## 2021-07-23 LAB — URINALYSIS, MICROSCOPIC (REFLEX)
Bacteria, UA: NONE SEEN
Squamous Epithelial / HPF: NONE SEEN (ref 0–5)

## 2021-07-23 LAB — PROTIME-INR
INR: 1.3 — ABNORMAL HIGH (ref 0.8–1.2)
Prothrombin Time: 16.4 seconds — ABNORMAL HIGH (ref 11.4–15.2)

## 2021-07-23 LAB — LACTIC ACID, PLASMA: Lactic Acid, Venous: 4.1 mmol/L (ref 0.5–1.9)

## 2021-07-23 LAB — AMMONIA: Ammonia: 45 umol/L — ABNORMAL HIGH (ref 9–35)

## 2021-07-23 LAB — CORTISOL-AM, BLOOD: Cortisol - AM: 35.4 ug/dL — ABNORMAL HIGH (ref 6.7–22.6)

## 2021-07-23 LAB — PROCALCITONIN: Procalcitonin: 0.23 ng/mL

## 2021-07-23 LAB — PHOSPHORUS: Phosphorus: 2.2 mg/dL — ABNORMAL LOW (ref 2.5–4.6)

## 2021-07-23 LAB — HIV ANTIBODY (ROUTINE TESTING W REFLEX): HIV Screen 4th Generation wRfx: NONREACTIVE

## 2021-07-23 MED ORDER — SODIUM CHLORIDE 0.9 % IV SOLN
2.0000 g | INTRAVENOUS | Status: DC
  Administered 2021-07-23: 2 g via INTRAVENOUS
  Filled 2021-07-23 (×2): qty 20

## 2021-07-23 MED ORDER — HYDROCHLOROTHIAZIDE 25 MG PO TABS
25.0000 mg | ORAL_TABLET | Freq: Every day | ORAL | Status: DC
  Administered 2021-07-23 – 2021-07-27 (×5): 25 mg via ORAL
  Filled 2021-07-23 (×5): qty 1

## 2021-07-23 MED ORDER — ALBUTEROL SULFATE (2.5 MG/3ML) 0.083% IN NEBU: 2.5000 mg | INHALATION_SOLUTION | Freq: Four times a day (QID) | RESPIRATORY_TRACT | Status: DC | PRN

## 2021-07-23 MED ORDER — SODIUM PHOSPHATES 45 MMOLE/15ML IV SOLN
30.0000 mmol | Freq: Once | INTRAVENOUS | Status: AC
  Administered 2021-07-23: 30 mmol via INTRAVENOUS
  Filled 2021-07-23: qty 10

## 2021-07-23 MED ORDER — FLUTICASONE FUROATE-VILANTEROL 200-25 MCG/ACT IN AEPB: 1.0000 | INHALATION_SPRAY | Freq: Every day | RESPIRATORY_TRACT | Status: DC | PRN

## 2021-07-23 MED ORDER — OMEPRAZOLE 20 MG PO CPDR
20.0000 mg | DELAYED_RELEASE_CAPSULE | Freq: Every day | ORAL | Status: DC
  Administered 2021-07-23 – 2021-07-27 (×5): 20 mg via ORAL
  Filled 2021-07-23 (×5): qty 1

## 2021-07-23 MED ORDER — LIP MEDEX EX OINT
TOPICAL_OINTMENT | CUTANEOUS | Status: DC | PRN
  Filled 2021-07-23: qty 7

## 2021-07-23 MED ORDER — ATENOLOL 50 MG PO TABS
50.0000 mg | ORAL_TABLET | Freq: Every day | ORAL | Status: DC
  Administered 2021-07-23 – 2021-07-27 (×5): 50 mg via ORAL
  Filled 2021-07-23 (×2): qty 1
  Filled 2021-07-23 (×3): qty 2

## 2021-07-23 MED ORDER — MONTELUKAST SODIUM 10 MG PO TABS
10.0000 mg | ORAL_TABLET | Freq: Every day | ORAL | Status: DC
  Administered 2021-07-23 – 2021-07-26 (×4): 10 mg via ORAL
  Filled 2021-07-23 (×4): qty 1

## 2021-07-23 MED ORDER — ALPRAZOLAM 0.5 MG PO TABS
0.5000 mg | ORAL_TABLET | Freq: Every day | ORAL | Status: DC | PRN
  Administered 2021-07-23 – 2021-07-26 (×3): 0.5 mg via ORAL
  Filled 2021-07-23 (×3): qty 1

## 2021-07-23 MED ORDER — ATENOLOL-CHLORTHALIDONE 50-25 MG PO TABS: 1.0000 | ORAL_TABLET | Freq: Every day | ORAL | Status: DC

## 2021-07-23 NOTE — Progress Notes (Signed)
Christine Francis   DOB:02/02/1961   ZO#:[id number]   JWJ#:[id number]  Subjective: Christine Francis is a 61 year old woman with history of breast cancer most recently on treatment with letrozole.  The patient underwent liver biopsy due to concern for metastatic involvement and subsequently developed leukocytosis abdominal distention and confusion. She was admitted to the hospital into a stepdown bed and diagnosed with severe sepsis and pseudoaneurysm.  She has been on broad-spectrum antibiotics.  Her biopsy results came back today, confirming that she has metastatic breast cancer to the liver.  Christine Francis is in the room with her sister.  She notes that she feels a little bit more alert and oriented today.  Objective:  Vitals:   07/23/21 1739 07/23/21 1800  BP: (!) 146/78 (!) 143/73  Pulse: (!) 114 (!) 109  Resp:  19  Temp:    SpO2:  97%    Body mass index is 30.51 kg/m.  Intake/Output Summary (Last 24 hours) at 07/23/2021 1943 Last data filed at 07/23/2021 1756 Gross per 24 hour  Intake 1764.92 ml  Output 300 ml  Net 1464.92 ml     Sclerae unicteric  Oropharynx shows no thrush or other lesions  No cervical or supraclavicular adenopathy  Lungs no rales or wheezes--auscultated anterolaterally  Heart regular rate and rhythm  Abdomen distended  Neuro nonfocal  Breast exam:   CBG (last 3)  No results for input(s): GLUCAP in the last 72 hours.   Labs:  Lab Results  Component Value Date   WBC 26.6 (H) 07/23/2021   HGB 8.1 (L) 07/23/2021   HCT 25.9 (L) 07/23/2021   MCV 97.4 07/23/2021   PLT 181 07/23/2021   NEUTROABS 14.6 (H) 07/22/2021    @LASTCHEMISTRY @  Urine Studies No results for input(s): UHGB, CRYS in the last 72 hours.  Invalid input(s): UACOL, UAPR, USPG, UPH, UTP, UGL, UKET, UBIL, UNIT, UROB, ULEU, UEPI, UWBC, Duwayne Heck [place], [place]  Basic Metabolic Panel: Recent Labs  Lab 07/18/21 0917 07/22/21 1117 07/23/21 0256  NA 134* 130* 129*  K 3.2* 3.4* 3.6  CL 102 98  99  CO2 24 22 19*  GLUCOSE 117* 120* 139*  BUN 15 19 25*  CREATININE 0.79 0.79 0.91  CALCIUM 12.9* 11.9* 10.9*  MG  --   --  2.0  PHOS  --   --  2.2*   GFR Estimated Creatinine Clearance: 70.7 mL/min (by C-G formula based on SCr of 0.91 mg/dL). Liver Function Tests: Recent Labs  Lab 07/18/21 0917 07/22/21 1117 07/23/21 0256  AST 100* 120* 126*  ALT 49* 54* 56*  ALKPHOS 142* 110 91  BILITOT 2.6* 3.3* 3.4*  PROT 8.1 7.5 6.9  ALBUMIN 3.4* 3.2* 2.7*   No results for input(s): LIPASE, AMYLASE in the last 168 hours. Recent Labs  Lab 07/22/21 1447 07/23/21 1218  AMMONIA 76* 45*   Coagulation profile Recent Labs  Lab 07/19/21 1051 07/23/21 0256  INR 1.2 1.3*    CBC: Recent Labs  Lab 07/18/21 0917 07/19/21 1051 07/22/21 1117 07/22/21 1519 07/23/21 0256 07/23/21 0904  WBC 17.9* 20.3* 20.0* 21.5* 24.6* 26.6*  NEUTROABS 11.6*  --  13.5* 14.6*  --   --   HGB 13.6 13.7 10.5* 10.2* 8.4* 8.1*  HCT 41.3 40.5 30.8* 30.3* 26.1* 25.9*  MCV 88.4 88.4 87.3 89.9 92.6 97.4  PLT 196 187 171 173 173 181   Cardiac Enzymes: No results for input(s): CKTOTAL, CKMB, CKMBINDEX, TROPONINI in the last 168 hours. BNP: Invalid input(s): POCBNP CBG: No  results for input(s): GLUCAP in the last 168 hours. D-Dimer No results for input(s): DDIMER in the last 72 hours. Hgb A1c No results for input(s): HGBA1C in the last 72 hours. Lipid Profile No results for input(s): CHOL, HDL, LDLCALC, TRIG, CHOLHDL, LDLDIRECT in the last 72 hours. Thyroid function studies No results for input(s): TSH, T4TOTAL, T3FREE, THYROIDAB in the last 72 hours.  Invalid input(s): FREET3 Anemia work up No results for input(s): VITAMINB12, FOLATE, FERRITIN, TIBC, IRON, RETICCTPCT in the last 72 hours. Microbiology Recent Results (from the past 240 hour(s))  Resp Panel by RT-PCR (Flu A&B, Covid) Nasopharyngeal Swab     Status: None   Collection Time: 07/22/21  5:04 PM   Specimen: Nasopharyngeal Swab;  Nasopharyngeal(NP) swabs in vial transport medium  Result Value Ref Range Status   SARS Coronavirus 2 by RT PCR NEGATIVE NEGATIVE Final    Comment: (NOTE) SARS-CoV-2 target nucleic acids are NOT DETECTED.  The SARS-CoV-2 RNA is generally detectable in upper respiratory specimens during the acute phase of infection. The lowest concentration of SARS-CoV-2 viral copies this assay can detect is 138 copies/mL. A negative result does not preclude SARS-Cov-2 infection and should not be used as the sole basis for treatment or other patient management decisions. A negative result may occur with  improper specimen collection/handling, submission of specimen other than nasopharyngeal swab, presence of viral mutation(s) within the areas targeted by this assay, and inadequate number of viral copies(<138 copies/mL). A negative result must be combined with clinical observations, patient history, and epidemiological information. The expected result is Negative.  Fact Sheet for Patients:  EntrepreneurPulse.com.au  Fact Sheet for Healthcare Providers:  IncredibleEmployment.be  This test is no t yet approved or cleared by the [place] FDA and  has been authorized for detection and/or diagnosis of SARS-CoV-2 by FDA under an Emergency Use Authorization (EUA). This EUA will remain  in effect (meaning this test can be used) for the duration of the COVID-19 declaration under Section 564(b)(1) of the Act, 21 U.S.C.section 360bbb-3(b)(1), unless the authorization is terminated  or revoked sooner.       Influenza A by PCR NEGATIVE NEGATIVE Final   Influenza B by PCR NEGATIVE NEGATIVE Final    Comment: (NOTE) The Xpert Xpress SARS-CoV-2/FLU/RSV plus assay is intended as an aid in the diagnosis of influenza from Nasopharyngeal swab specimens and should not be used as a sole basis for treatment. Nasal washings and aspirates are unacceptable for Xpert Xpress  SARS-CoV-2/FLU/RSV testing.  Fact Sheet for Patients: EntrepreneurPulse.com.au  Fact Sheet for Healthcare Providers: IncredibleEmployment.be  This test is not yet approved or cleared by the [place] FDA and has been authorized for detection and/or diagnosis of SARS-CoV-2 by FDA under an Emergency Use Authorization (EUA). This EUA will remain in effect (meaning this test can be used) for the duration of the COVID-19 declaration under Section 564(b)(1) of the Act, 21 U.S.C. section 360bbb-3(b)(1), unless the authorization is terminated or revoked.  Performed at [facility], [place] [address] [place], [place] [address]       Studies:  DG Chest 2 View  Result Date: 07/22/2021 CLINICAL DATA:  Shortness of breath after liver biopsy. EXAM: CHEST - 2 VIEW COMPARISON:  None. FINDINGS: The heart size and mediastinal contours are within normal limits. Right lung is clear. Minimal left lingular subsegmental atelectasis or scarring is noted. The visualized skeletal structures are unremarkable. IMPRESSION: Minimal left lingular subsegmental atelectasis or scarring. Electronically Signed   By: Sabino Dick  Jr M.D.   On: 07/22/2021 15:12   CT Head Wo Contrast  Result Date: 07/22/2021 CLINICAL DATA:  Head trauma moderate to severe. Brain Mets suspected. EXAM: CT HEAD WITHOUT CONTRAST TECHNIQUE: Contiguous axial images were obtained from the base of the skull through the vertex without intravenous contrast. COMPARISON:  None. FINDINGS: Brain: No evidence of acute infarction, hemorrhage, hydrocephalus, extra-axial collection or mass lesion/mass effect. Vascular: No hyperdense vessel or unexpected calcification. Skull: Normal. Negative for fracture or focal lesion. Sinuses/Orbits: No acute finding. Other: None. IMPRESSION: No acute intracranial abnormality. No appreciable mass on this noncontrast enhanced examination. Electronically Signed   By:  Keane Police D.O.   On: 07/22/2021 15:12   IR Angiogram Visceral Selective  Result Date: 07/23/2021 CLINICAL DATA:  Recent percutaneous liver biopsy, now with increasing abdominal ascites and pericapsular pseudoaneurysm. EXAM: SELECTIVE VISCERAL ARTERIOGRAPHY; IR ULTRASOUND GUIDANCE VASC ACCESS RIGHT; IR EMBO ART VEN HEMORR LYMPH EXTRAV INC GUIDE ROADMAPPING; ADDITIONAL ARTERIOGRAPHY ANESTHESIA/SEDATION: Intravenous Fentanyl 149mcg and Versed 1.5mg  were administered as conscious sedation during continuous monitoring of the patient's level of consciousness and physiological / cardiorespiratory status by the radiology RN, with a total moderate sedation time of 86 minutes. MEDICATIONS: Lidocaine 1% subcutaneous CONTRAST:  89mL OMNIPAQUE IOHEXOL 300 MG/ML  SOLN PROCEDURE: The procedure, risks (including but not limited to bleeding, infection, organ damage ), benefits, and alternatives were explained to the patient. Questions regarding the procedure were encouraged and answered. The patient understands and consents to the procedure. Right femoral region prepped and draped in usual sterile fashion. Maximal barrier sterile technique was utilized including caps, mask, sterile gowns, sterile gloves, sterile drape, hand hygiene and skin antiseptic. The right common femoral artery was localized under ultrasound. Under real-time ultrasound guidance, the vessel was accessed with a 21-gauge micropuncture needle, exchanged over a 018 guidewire for a transitional dilator, through which a 035 guidewire was advanced. Over this, a 5 [place] vascular sheath was placed, through which a 5 [place] C2 catheter was advanced and used to selectively catheterize the celiac axis for selective arteriography in multiple projections. The C2 was then utilized to selectively catheterize the common hepatic artery artery for selective arteriography in multiple projections. The C2 was then utilized to selectively catheterize the right hepatic artery  for selective arteriography. Coaxial Renegade microcatheter was advanced with a 016 guidewire and used for selective catheterization of a peripheral right hepatic arterial branch supplying the pseudoaneurysm. Once the catheter was positioned as peripherally is possible, a single 2 mm X 2 cm interlock coil was deployed. After 4 minutes, confirmatory arteriogram was obtained showing no further opacification of the pseudoaneurysm. The microcatheter, catheter and sheath were removed and hemostasis achieved with the aid of the Exoseal device after confirmatory femoral arteriography. The patient tolerated the procedure well. COMPLICATIONS: None immediate FINDINGS: Continued opacification of extracapsular pseudoaneurysm from the right hepatic lobe supplied by small right hepatic arterial branch. Technically successful coil embolization of a peripheral feeding branch, with no further opacification of the pseudoaneurysm. IMPRESSION: 1. Technically successful coil embolization of the feeding branch to right hepatic arterial pericapsular pseudoaneurysm. Electronically Signed   By: Lucrezia Europe M.D.   On: 07/23/2021 08:25   IR Angiogram Selective Each Additional Vessel  Result Date: 07/23/2021 CLINICAL DATA:  Recent percutaneous liver biopsy, now with increasing abdominal ascites and pericapsular pseudoaneurysm. EXAM: SELECTIVE VISCERAL ARTERIOGRAPHY; IR ULTRASOUND GUIDANCE VASC ACCESS RIGHT; IR EMBO ART VEN HEMORR LYMPH EXTRAV INC GUIDE ROADMAPPING; ADDITIONAL ARTERIOGRAPHY ANESTHESIA/SEDATION: Intravenous Fentanyl 19mcg and Versed 1.5mg  were administered  as conscious sedation during continuous monitoring of the patient's level of consciousness and physiological / cardiorespiratory status by the radiology RN, with a total moderate sedation time of 86 minutes. MEDICATIONS: Lidocaine 1% subcutaneous CONTRAST:  64mL OMNIPAQUE IOHEXOL 300 MG/ML  SOLN PROCEDURE: The procedure, risks (including but not limited to bleeding,  infection, organ damage ), benefits, and alternatives were explained to the patient. Questions regarding the procedure were encouraged and answered. The patient understands and consents to the procedure. Right femoral region prepped and draped in usual sterile fashion. Maximal barrier sterile technique was utilized including caps, mask, sterile gowns, sterile gloves, sterile drape, hand hygiene and skin antiseptic. The right common femoral artery was localized under ultrasound. Under real-time ultrasound guidance, the vessel was accessed with a 21-gauge micropuncture needle, exchanged over a 018 guidewire for a transitional dilator, through which a 035 guidewire was advanced. Over this, a 5 [place] vascular sheath was placed, through which a 5 [place] C2 catheter was advanced and used to selectively catheterize the celiac axis for selective arteriography in multiple projections. The C2 was then utilized to selectively catheterize the common hepatic artery artery for selective arteriography in multiple projections. The C2 was then utilized to selectively catheterize the right hepatic artery for selective arteriography. Coaxial Renegade microcatheter was advanced with a 016 guidewire and used for selective catheterization of a peripheral right hepatic arterial branch supplying the pseudoaneurysm. Once the catheter was positioned as peripherally is possible, a single 2 mm X 2 cm interlock coil was deployed. After 4 minutes, confirmatory arteriogram was obtained showing no further opacification of the pseudoaneurysm. The microcatheter, catheter and sheath were removed and hemostasis achieved with the aid of the Exoseal device after confirmatory femoral arteriography. The patient tolerated the procedure well. COMPLICATIONS: None immediate FINDINGS: Continued opacification of extracapsular pseudoaneurysm from the right hepatic lobe supplied by small right hepatic arterial branch. Technically successful coil embolization of  a peripheral feeding branch, with no further opacification of the pseudoaneurysm. IMPRESSION: 1. Technically successful coil embolization of the feeding branch to right hepatic arterial pericapsular pseudoaneurysm. Electronically Signed   By: Lucrezia Europe M.D.   On: 07/23/2021 08:25   IR Angiogram Selective Each Additional Vessel  Result Date: 07/23/2021 CLINICAL DATA:  Recent percutaneous liver biopsy, now with increasing abdominal ascites and pericapsular pseudoaneurysm. EXAM: SELECTIVE VISCERAL ARTERIOGRAPHY; IR ULTRASOUND GUIDANCE VASC ACCESS RIGHT; IR EMBO ART VEN HEMORR LYMPH EXTRAV INC GUIDE ROADMAPPING; ADDITIONAL ARTERIOGRAPHY ANESTHESIA/SEDATION: Intravenous Fentanyl 117mcg and Versed 1.5mg  were administered as conscious sedation during continuous monitoring of the patient's level of consciousness and physiological / cardiorespiratory status by the radiology RN, with a total moderate sedation time of 86 minutes. MEDICATIONS: Lidocaine 1% subcutaneous CONTRAST:  37mL OMNIPAQUE IOHEXOL 300 MG/ML  SOLN PROCEDURE: The procedure, risks (including but not limited to bleeding, infection, organ damage ), benefits, and alternatives were explained to the patient. Questions regarding the procedure were encouraged and answered. The patient understands and consents to the procedure. Right femoral region prepped and draped in usual sterile fashion. Maximal barrier sterile technique was utilized including caps, mask, sterile gowns, sterile gloves, sterile drape, hand hygiene and skin antiseptic. The right common femoral artery was localized under ultrasound. Under real-time ultrasound guidance, the vessel was accessed with a 21-gauge micropuncture needle, exchanged over a 018 guidewire for a transitional dilator, through which a 035 guidewire was advanced. Over this, a 5 [place] vascular sheath was placed, through which a 5 [place] C2 catheter was advanced and used to selectively catheterize the  celiac axis for selective  arteriography in multiple projections. The C2 was then utilized to selectively catheterize the common hepatic artery artery for selective arteriography in multiple projections. The C2 was then utilized to selectively catheterize the right hepatic artery for selective arteriography. Coaxial Renegade microcatheter was advanced with a 016 guidewire and used for selective catheterization of a peripheral right hepatic arterial branch supplying the pseudoaneurysm. Once the catheter was positioned as peripherally is possible, a single 2 mm X 2 cm interlock coil was deployed. After 4 minutes, confirmatory arteriogram was obtained showing no further opacification of the pseudoaneurysm. The microcatheter, catheter and sheath were removed and hemostasis achieved with the aid of the Exoseal device after confirmatory femoral arteriography. The patient tolerated the procedure well. COMPLICATIONS: None immediate FINDINGS: Continued opacification of extracapsular pseudoaneurysm from the right hepatic lobe supplied by small right hepatic arterial branch. Technically successful coil embolization of a peripheral feeding branch, with no further opacification of the pseudoaneurysm. IMPRESSION: 1. Technically successful coil embolization of the feeding branch to right hepatic arterial pericapsular pseudoaneurysm. Electronically Signed   By: Lucrezia Europe M.D.   On: 07/23/2021 08:25   IR Angiogram Selective Each Additional Vessel  Result Date: 07/23/2021 CLINICAL DATA:  Recent percutaneous liver biopsy, now with increasing abdominal ascites and pericapsular pseudoaneurysm. EXAM: SELECTIVE VISCERAL ARTERIOGRAPHY; IR ULTRASOUND GUIDANCE VASC ACCESS RIGHT; IR EMBO ART VEN HEMORR LYMPH EXTRAV INC GUIDE ROADMAPPING; ADDITIONAL ARTERIOGRAPHY ANESTHESIA/SEDATION: Intravenous Fentanyl 131mcg and Versed 1.5mg  were administered as conscious sedation during continuous monitoring of the patient's level of consciousness and physiological /  cardiorespiratory status by the radiology RN, with a total moderate sedation time of 86 minutes. MEDICATIONS: Lidocaine 1% subcutaneous CONTRAST:  69mL OMNIPAQUE IOHEXOL 300 MG/ML  SOLN PROCEDURE: The procedure, risks (including but not limited to bleeding, infection, organ damage ), benefits, and alternatives were explained to the patient. Questions regarding the procedure were encouraged and answered. The patient understands and consents to the procedure. Right femoral region prepped and draped in usual sterile fashion. Maximal barrier sterile technique was utilized including caps, mask, sterile gowns, sterile gloves, sterile drape, hand hygiene and skin antiseptic. The right common femoral artery was localized under ultrasound. Under real-time ultrasound guidance, the vessel was accessed with a 21-gauge micropuncture needle, exchanged over a 018 guidewire for a transitional dilator, through which a 035 guidewire was advanced. Over this, a 5 [place] vascular sheath was placed, through which a 5 [place] C2 catheter was advanced and used to selectively catheterize the celiac axis for selective arteriography in multiple projections. The C2 was then utilized to selectively catheterize the common hepatic artery artery for selective arteriography in multiple projections. The C2 was then utilized to selectively catheterize the right hepatic artery for selective arteriography. Coaxial Renegade microcatheter was advanced with a 016 guidewire and used for selective catheterization of a peripheral right hepatic arterial branch supplying the pseudoaneurysm. Once the catheter was positioned as peripherally is possible, a single 2 mm X 2 cm interlock coil was deployed. After 4 minutes, confirmatory arteriogram was obtained showing no further opacification of the pseudoaneurysm. The microcatheter, catheter and sheath were removed and hemostasis achieved with the aid of the Exoseal device after confirmatory femoral arteriography.  The patient tolerated the procedure well. COMPLICATIONS: None immediate FINDINGS: Continued opacification of extracapsular pseudoaneurysm from the right hepatic lobe supplied by small right hepatic arterial branch. Technically successful coil embolization of a peripheral feeding branch, with no further opacification of the pseudoaneurysm. IMPRESSION: 1. Technically successful coil embolization of the  feeding branch to right hepatic arterial pericapsular pseudoaneurysm. Electronically Signed   By: Lucrezia Europe M.D.   On: 07/23/2021 08:25   IR US Guide Vasc Access Right  Result Date: 07/23/2021 CLINICAL DATA:  Recent percutaneous liver biopsy, now with increasing abdominal ascites and pericapsular pseudoaneurysm. EXAM: SELECTIVE VISCERAL ARTERIOGRAPHY; IR ULTRASOUND GUIDANCE VASC ACCESS RIGHT; IR EMBO ART VEN HEMORR LYMPH EXTRAV INC GUIDE ROADMAPPING; ADDITIONAL ARTERIOGRAPHY ANESTHESIA/SEDATION: Intravenous Fentanyl 135mcg and Versed 1.5mg  were administered as conscious sedation during continuous monitoring of the patient's level of consciousness and physiological / cardiorespiratory status by the radiology RN, with a total moderate sedation time of 86 minutes. MEDICATIONS: Lidocaine 1% subcutaneous CONTRAST:  104mL OMNIPAQUE IOHEXOL 300 MG/ML  SOLN PROCEDURE: The procedure, risks (including but not limited to bleeding, infection, organ damage ), benefits, and alternatives were explained to the patient. Questions regarding the procedure were encouraged and answered. The patient understands and consents to the procedure. Right femoral region prepped and draped in usual sterile fashion. Maximal barrier sterile technique was utilized including caps, mask, sterile gowns, sterile gloves, sterile drape, hand hygiene and skin antiseptic. The right common femoral artery was localized under ultrasound. Under real-time ultrasound guidance, the vessel was accessed with a 21-gauge micropuncture needle, exchanged over a 018  guidewire for a transitional dilator, through which a 035 guidewire was advanced. Over this, a 5 [place] vascular sheath was placed, through which a 5 [place] C2 catheter was advanced and used to selectively catheterize the celiac axis for selective arteriography in multiple projections. The C2 was then utilized to selectively catheterize the common hepatic artery artery for selective arteriography in multiple projections. The C2 was then utilized to selectively catheterize the right hepatic artery for selective arteriography. Coaxial Renegade microcatheter was advanced with a 016 guidewire and used for selective catheterization of a peripheral right hepatic arterial branch supplying the pseudoaneurysm. Once the catheter was positioned as peripherally is possible, a single 2 mm X 2 cm interlock coil was deployed. After 4 minutes, confirmatory arteriogram was obtained showing no further opacification of the pseudoaneurysm. The microcatheter, catheter and sheath were removed and hemostasis achieved with the aid of the Exoseal device after confirmatory femoral arteriography. The patient tolerated the procedure well. COMPLICATIONS: None immediate FINDINGS: Continued opacification of extracapsular pseudoaneurysm from the right hepatic lobe supplied by small right hepatic arterial branch. Technically successful coil embolization of a peripheral feeding branch, with no further opacification of the pseudoaneurysm. IMPRESSION: 1. Technically successful coil embolization of the feeding branch to right hepatic arterial pericapsular pseudoaneurysm. Electronically Signed   By: Lucrezia Europe M.D.   On: 07/23/2021 08:25   IR EMBO ART  VEN HEMORR LYMPH EXTRAV  INC GUIDE ROADMAPPING  Result Date: 07/23/2021 CLINICAL DATA:  Recent percutaneous liver biopsy, now with increasing abdominal ascites and pericapsular pseudoaneurysm. EXAM: SELECTIVE VISCERAL ARTERIOGRAPHY; IR ULTRASOUND GUIDANCE VASC ACCESS RIGHT; IR EMBO ART VEN HEMORR  LYMPH EXTRAV INC GUIDE ROADMAPPING; ADDITIONAL ARTERIOGRAPHY ANESTHESIA/SEDATION: Intravenous Fentanyl 146mcg and Versed 1.5mg  were administered as conscious sedation during continuous monitoring of the patient's level of consciousness and physiological / cardiorespiratory status by the radiology RN, with a total moderate sedation time of 86 minutes. MEDICATIONS: Lidocaine 1% subcutaneous CONTRAST:  44mL OMNIPAQUE IOHEXOL 300 MG/ML  SOLN PROCEDURE: The procedure, risks (including but not limited to bleeding, infection, organ damage ), benefits, and alternatives were explained to the patient. Questions regarding the procedure were encouraged and answered. The patient understands and consents to the procedure. Right femoral region prepped and draped  in usual sterile fashion. Maximal barrier sterile technique was utilized including caps, mask, sterile gowns, sterile gloves, sterile drape, hand hygiene and skin antiseptic. The right common femoral artery was localized under ultrasound. Under real-time ultrasound guidance, the vessel was accessed with a 21-gauge micropuncture needle, exchanged over a 018 guidewire for a transitional dilator, through which a 035 guidewire was advanced. Over this, a 5 [place] vascular sheath was placed, through which a 5 [place] C2 catheter was advanced and used to selectively catheterize the celiac axis for selective arteriography in multiple projections. The C2 was then utilized to selectively catheterize the common hepatic artery artery for selective arteriography in multiple projections. The C2 was then utilized to selectively catheterize the right hepatic artery for selective arteriography. Coaxial Renegade microcatheter was advanced with a 016 guidewire and used for selective catheterization of a peripheral right hepatic arterial branch supplying the pseudoaneurysm. Once the catheter was positioned as peripherally is possible, a single 2 mm X 2 cm interlock coil was deployed. After 4  minutes, confirmatory arteriogram was obtained showing no further opacification of the pseudoaneurysm. The microcatheter, catheter and sheath were removed and hemostasis achieved with the aid of the Exoseal device after confirmatory femoral arteriography. The patient tolerated the procedure well. COMPLICATIONS: None immediate FINDINGS: Continued opacification of extracapsular pseudoaneurysm from the right hepatic lobe supplied by small right hepatic arterial branch. Technically successful coil embolization of a peripheral feeding branch, with no further opacification of the pseudoaneurysm. IMPRESSION: 1. Technically successful coil embolization of the feeding branch to right hepatic arterial pericapsular pseudoaneurysm. Electronically Signed   By: Lucrezia Europe M.D.   On: 07/23/2021 08:25   CT Angio Abd/Pel W and/or Wo Contrast  Result Date: 07/22/2021 CLINICAL DATA:  Recent liver biopsy. Free fluid on bedside ultrasound. Evaluate for liver bleed. EXAM: CT ANGIOGRAPHY ABDOMEN AND PELVIS WITH CONTRAST AND WITHOUT CONTRAST TECHNIQUE: Multidetector CT imaging of the abdomen and pelvis was performed using the standard protocol during bolus administration of intravenous contrast. Multiplanar reconstructed images and MIPs were obtained and reviewed to evaluate the vascular anatomy. CONTRAST:  161mL OMNIPAQUE IOHEXOL 350 MG/ML SOLN COMPARISON:  06/12/2021 FINDINGS: VASCULAR Aorta: Atherosclerotic calcifications in the abdominal aorta without aneurysm, dissection or significant stenosis. Celiac: Patent without evidence of aneurysm, dissection, vasculitis or significant stenosis. Variant anatomy with an accessory left hepatic artery coming off the left gastric artery. There is focal contrast extravasation just lateral to the right hepatic lobe on the arterial phase imaging on sequence 6, image 71. This is well seen on the coronal images, sequence 7, image 73. This is a relatively well-defined elongated structure on the  arterial coronal arterial images, measuring up to 1.6 cm. This area does not significantly change in size on the delayed images and there is no clear evidence for contrast pooling outside of this isolated elongated structure. Finding is unusual but could represent apseudoaneurysm near the liver capsule. SMA: Patent without evidence of aneurysm, dissection, vasculitis or significant stenosis. Renals: Both renal arteries are patent without evidence of aneurysm, dissection, vasculitis, fibromuscular dysplasia or significant stenosis. IMA: Patent without evidence of aneurysm, dissection, vasculitis or significant stenosis. Inflow: Patent without evidence of aneurysm, dissection, vasculitis or significant stenosis. Proximal Outflow: Proximal femoral arteries are patent bilaterally. Veins: IVC and renal veins are patent. Main portal venous system appears to be patent but limited evaluation of intrahepatic portal veins. There is enlarged and umbilical vein along the anterior abdomen. Iliac veins are patent. Review of the MIP images confirms the above findings.  NON-VASCULAR Lower chest: Bilateral breast implants. No pleural effusions. Patchy densities at lung bases likely represent atelectasis. Hepatobiliary: Liver is heterogeneous. No large discrete liver lesions. Normal appearance of the gallbladder. No significant biliary dilatation. Pancreas: Unremarkable. No pancreatic ductal dilatation or surrounding inflammatory changes. Spleen: Normal in size without focal abnormality. Adrenals/Urinary Tract: Normal appearance of the adrenal glands. Normal appearance of the right kidney. Left renal cysts. Negative for hydronephrosis. Small amount of fluid in the urinary bladder. Stomach/Bowel: Normal appearance of the stomach. There is no evidence for bowel distention or focal bowel inflammation. Lymphatic: No lymph node enlargement in the abdomen or pelvis. Reproductive: Uterus and bilateral adnexa are unremarkable. Other: Moderate  amount of ascites in the abdomen and pelvis. Hounsfield units of the perihepatic ascites is relatively low density measuring between 11-13. Fluid in the cul-de-sac measures approximately 24. Most dense ascites is in the right lower quadrant measuring roughly 26 Hounsfield units. The ascites has increased from the PET-CT on 07/05/2021. Negative for free air. Musculoskeletal: No acute bone abnormality. IMPRESSION: VASCULAR 1. Focal area of contrast extravasation just lateral to the liver capsule in the right hepatic lobe. This location corresponds with the recent biopsy site. This area of contrast extravasation may be contained because it does not significantly enlarge on the delayed images. This could represent an elongated pseudoaneurysm along the capsule measuring roughly 1.6 cm. Dedicated ultrasound of this area with duplex imaging may better evaluate this area. 2.  Aortic Atherosclerosis (ICD10-I70.0). 3. Persistent enlarged umbilical vein. Finding raises concern for underlying portal hypertension. NON-VASCULAR 1. Increased free fluid in the abdomen and pelvis compared to 07/05/2021. Hounsfield units are slightly dense but more dense than the previous PET-CT. Suspect this fluid represents a combination of blood and underlying ascites. 2. Again noted is a heterogeneous liver without a discrete lesion. These results were called by telephone at the time of interpretation on 07/22/2021 at 4:28 pm to provider [facility] , who verbally acknowledged these results. Electronically Signed   By: Markus Daft M.D.   On: 07/22/2021 16:51    Assessment/Plan: 61 y.o. woman with history of stage IIa invasive lobular carcinoma, estrogen and progesterone positive status post bilateral mastectomies and adjuvant antiestrogen therapy with aromatase inhibitors beginning in August 2020.   --------------------------------------------------------------------------------------------------- #1 newly confirmed metastatic breast cancer.   I spent about 30 minutes at the patient's bedside today reviewing the unfortunate results that indicate that her cancer has spread to her liver.  We are currently awaiting the results of her prognostic panel which will help guide [place] in deciding her treatment therapy.  At this point we do not recommend that she resume her antiestrogen therapy.    Christine Francis, her sister, and I reviewed that stage IV cancer is not curable however her goal of care is to control the breast cancer.  We discussed that there are many new promising treatment options and to remain hopeful and focused on regaining her strength and recovering from this hospitalization so we can get her new treatment started.  Christine Francis and her sister voiced understanding of our discussion.  We will follow-up with them tomorrow.  I let them know that they can call the cancer center for any questions or concerns that they may have about this moving forward.  Other medical problems as per primary attending team.  Thank you for the great care of this patient.  I reviewed the above plan with Dr. Lindi Adie who is in agreement with it.   Scot Dock, NP 07/23/2021  7:43 PM Medical Oncology and Hematology [facility] [address] [place], [place] [address] Tel. [phone]    Fax. [phone]

## 2021-07-23 NOTE — Progress Notes (Signed)
PROGRESS NOTE    Vernetta Rutha Melgoza  GNO:[id number] DOB: [birth year] DOA: 07/22/2021 PCP: Molli Posey, MD   Brief Narrative:  This 61 years old female with PMH significant for left breast cancer s/p bilateral mastectomy on letrozole therapy, anxiety disorder, hypercalcemia, essential hypertension was sent from cancer center with multiple complaints.  Patient was found to have liver lesions and she underwent liver biopsy 4 days ago by IR.  Patient has developed pain around the biopsy area,  described pain as burning 10 /10 on pain scale associated with fatigue, generalized weakness and tiredness.  Patient has been following up with oncology and getting Zometa for hypercalcemia.  Patient had appointment with oncologist and she was found to have leukocytosis , abdominal distention and confusion. Patient is admitted for severe sepsis, started on empiric antibiotics.  She is also found to have pseudoaneurysm on CT scan, IR is consulted.  She underwent coil embolization of bleeding artery.  Continue to monitor H&H, may require IR intervention.  Assessment & Plan:   Principal Problem:   Severe sepsis with acute organ dysfunction (HCC) Active Problems:   Malignant neoplasm of upper-outer quadrant of left breast in female, estrogen receptor positive ([place])   Anxiety   S/P bilateral mastectomy   Hypercalcemia   Severe sepsis unknown source: Patient presented with tachycardia, tachypnea,hypotension, lactic acidosis, leukocytosis.   Suspect source is abdominal, ascites, SBP, pseudoaneurysm. CT abdomen showed increased free fluid in the abdomen and pelvis. Recent liver biopsy complicated by possible pseudoaneurysm. Continue empiric antibiotics (vancomycin, cefepime and Flagyl) Follow blood cultures, UA unremarkable.  Chest x-ray no infiltrate. IR is consulted, recommended to hold on paracentesis unless symptomatic. Continue gentle IV hydration.   Hyperammoniemia: Ammonia level 76.  Patient had  slight confusion but more awake and alert now. Continue lactulose every 8 hours.  Recheck ammonia level.   Ascitis/ Abdominal Distension: Patient presented with significant abdominal distention, ascites,  tenderness. Continue empiric antibiotics. Follow-up IR for timing for paracentesis. Continue lactulose.   Pseudoaneurysm: Liver biopsy complicated by pseudoaneurysm. Patient was evaluated by IR recommended H&H every 6 hours. He underwent coil embolization of the bleeding artery. Adequate pain control.  Follow-up IR.   Hx. 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 Patient is on letrozole therapy.  Dr. Alvy Bimler is notified, awaiting formal consult.   Hypercalcemia: Patient is following with oncology and getting Zometa for hypercalcemia.   Serum calcium 11.9.>10.9  Continue IV hydration. Continue to monitor serum calcium.   Leucocytosis: Leukocytosis could be from sepsis, possible source Ascitis. Continue antibiotics, continue to monitor white cell.   Elevated liver enzymes: This could be due to liver mets, continue to monitor liver enzymes.   Elevated Trop: Patient denies any chest pain, EKG sinus tachycardia. This could be due to demand ischemia.  Continue to trend troponin.   Essential hypertension: Resume blood pressure medications once blood pressure improves.   GERD: Continue pantoprazole.   DVT prophylaxis: SCDs Code Status: Full code Family Communication: Sister at bedside Disposition Plan:   Status is: Inpatient  Remains inpatient appropriate because:   Admitted for severe sepsis, pseudoaneurysm underwent coil embolization of bleeding artery.  Monitor H&H.  Consultants:  Interventional radiology Oncology  Procedures: Coil embolization of bleeding artery Antimicrobials:   Anti-infectives (From admission, onward)    Start     Dose/Rate Route Frequency Ordered Stop   07/23/21  1800  vancomycin (VANCOREADY) IVPB 1250 mg/250 mL  1,250 mg 166.7 mL/hr over 90 Minutes Intravenous Every 24 hours 07/22/21 1830     07/23/21 0000  ceFEPIme (MAXIPIME) 2 g in sodium chloride 0.9 % 100 mL IVPB        2 g 200 mL/hr over 30 Minutes Intravenous Every 8 hours 07/22/21 1739     07/22/21 2200  metroNIDAZOLE (FLAGYL) IVPB 500 mg        500 mg 100 mL/hr over 60 Minutes Intravenous Every 12 hours 07/22/21 1649 07/29/21 1759   07/22/21 1715  vancomycin (VANCOREADY) IVPB 1750 mg/350 mL        1,750 mg 175 mL/hr over 120 Minutes Intravenous  Once 07/22/21 1631 07/22/21 1959   07/22/21 1645  ceFEPIme (MAXIPIME) 2 g in sodium chloride 0.9 % 100 mL IVPB        2 g 200 mL/hr over 30 Minutes Intravenous  Once 07/22/21 1631 07/22/21 1726   07/22/21 1645  metroNIDAZOLE (FLAGYL) IVPB 500 mg        500 mg 100 mL/hr over 60 Minutes Intravenous  Once 07/22/21 1636 07/22/21 1854       Subjective: Patient was seen and examined at bedside.  Overnight events noted.  Patient reports feeling much improved. Patient underwent coil embolization of bleeding artery by IR.  Tolerated well, abdomen is still distended.  Objective: Vitals:   07/23/21 0756 07/23/21 0800 07/23/21 0900 07/23/21 1126  BP:  96/73 136/76   Pulse:  (!) 114 (!) 109   Resp:  (!) 24 19   Temp: 98.2 F (36.8 C)   98.2 F (36.8 C)  TempSrc: Oral   Axillary  SpO2:  96% 96%   Weight:      Height:        Intake/Output Summary (Last 24 hours) at 07/23/2021 1154 Last data filed at 07/23/2021 0756 Gross per 24 hour  Intake 1541.59 ml  Output 300 ml  Net 1241.59 ml   Filed Weights   07/22/21 1431  Weight: 85.7 kg    Examination:  General exam: Appears comfortable, not in any acute distress. Respiratory system: Clear to auscultation bilaterally, respiratory effort normal, RR 15 Cardiovascular system: S1-S2 heard, regular rate and rhythm, no murmur. Gastrointestinal system: Abdomen is soft, distended, nontender,  BS+ Central nervous system: Alert and oriented x 3. No focal neurological deficits. Extremities: No edema, no cyanosis, no clubbing Skin: No rashes, lesions or ulcers Psychiatry: Judgement and insight appear normal. Mood & affect appropriate.     Data Reviewed: I have personally reviewed following labs and imaging studies  CBC: Recent Labs  Lab 07/18/21 0917 07/19/21 1051 07/22/21 1117 07/22/21 1519 07/23/21 0256 07/23/21 0904  WBC 17.9* 20.3* 20.0* 21.5* 24.6* 26.6*  NEUTROABS 11.6*  --  13.5* 14.6*  --   --   HGB 13.6 13.7 10.5* 10.2* 8.4* 8.1*  HCT 41.3 40.5 30.8* 30.3* 26.1* 25.9*  MCV 88.4 88.4 87.3 89.9 92.6 97.4  PLT 196 187 171 173 173 681   Basic Metabolic Panel: Recent Labs  Lab 07/18/21 0917 07/22/21 1117 07/23/21 0256  NA 134* 130* 129*  K 3.2* 3.4* 3.6  CL 102 98 99  CO2 24 22 19*  GLUCOSE 117* 120* 139*  BUN 15 19 25*  CREATININE 0.79 0.79 0.91  CALCIUM 12.9* 11.9* 10.9*  MG  --   --  2.0  PHOS  --   --  2.2*   GFR: Estimated Creatinine Clearance: 70.7 mL/min (by C-G formula based on SCr of 0.91 mg/dL). Liver Function  Tests: Recent Labs  Lab 07/18/21 0917 07/22/21 1117 07/23/21 0256  AST 100* 120* 126*  ALT 49* 54* 56*  ALKPHOS 142* 110 91  BILITOT 2.6* 3.3* 3.4*  PROT 8.1 7.5 6.9  ALBUMIN 3.4* 3.2* 2.7*   No results for input(s): LIPASE, AMYLASE in the last 168 hours. Recent Labs  Lab 07/22/21 1447  AMMONIA 76*   Coagulation Profile: Recent Labs  Lab 07/19/21 1051 07/23/21 0256  INR 1.2 1.3*   Cardiac Enzymes: No results for input(s): CKTOTAL, CKMB, CKMBINDEX, TROPONINI in the last 168 hours. BNP (last 3 results) No results for input(s): PROBNP in the last 8760 hours. HbA1C: No results for input(s): HGBA1C in the last 72 hours. CBG: No results for input(s): GLUCAP in the last 168 hours. Lipid Profile: No results for input(s): CHOL, HDL, LDLCALC, TRIG, CHOLHDL, LDLDIRECT in the last 72 hours. Thyroid Function Tests: No  results for input(s): TSH, T4TOTAL, FREET4, T3FREE, THYROIDAB in the last 72 hours. Anemia Panel: No results for input(s): VITAMINB12, FOLATE, FERRITIN, TIBC, IRON, RETICCTPCT in the last 72 hours. Sepsis Labs: Recent Labs  Lab 07/22/21 1647 07/22/21 1803 07/23/21 0256 07/23/21 0904  PROCALCITON  --   --  0.23  --   LATICACIDVEN 4.7* 5.6*  --  4.1*    Recent Results (from the past 240 hour(s))  Resp Panel by RT-PCR (Flu A&B, Covid) Nasopharyngeal Swab     Status: None   Collection Time: 07/22/21  5:04 PM   Specimen: Nasopharyngeal Swab; Nasopharyngeal(NP) swabs in vial transport medium  Result Value Ref Range Status   SARS Coronavirus 2 by RT PCR NEGATIVE NEGATIVE Final    Comment: (NOTE) SARS-CoV-2 target nucleic acids are NOT DETECTED.  The SARS-CoV-2 RNA is generally detectable in upper respiratory specimens during the acute phase of infection. The lowest concentration of SARS-CoV-2 viral copies this assay can detect is 138 copies/mL. A negative result does not preclude SARS-Cov-2 infection and should not be used as the sole basis for treatment or other patient management decisions. A negative result may occur with  improper specimen collection/handling, submission of specimen other than nasopharyngeal swab, presence of viral mutation(s) within the areas targeted by this assay, and inadequate number of viral copies(<138 copies/mL). A negative result must be combined with clinical observations, patient history, and epidemiological information. The expected result is Negative.  Fact Sheet for Patients:  EntrepreneurPulse.com.au  Fact Sheet for Healthcare Providers:  IncredibleEmployment.be  This test is no t yet approved or cleared by the [place] FDA and  has been authorized for detection and/or diagnosis of SARS-CoV-2 by FDA under an Emergency Use Authorization (EUA). This EUA will remain  in effect (meaning this test can be  used) for the duration of the COVID-19 declaration under Section 564(b)(1) of the Act, 21 U.S.C.section 360bbb-3(b)(1), unless the authorization is terminated  or revoked sooner.       Influenza A by PCR NEGATIVE NEGATIVE Final   Influenza B by PCR NEGATIVE NEGATIVE Final    Comment: (NOTE) The Xpert Xpress SARS-CoV-2/FLU/RSV plus assay is intended as an aid in the diagnosis of influenza from Nasopharyngeal swab specimens and should not be used as a sole basis for treatment. Nasal washings and aspirates are unacceptable for Xpert Xpress SARS-CoV-2/FLU/RSV testing.  Fact Sheet for Patients: EntrepreneurPulse.com.au  Fact Sheet for Healthcare Providers: IncredibleEmployment.be  This test is not yet approved or cleared by the [place] FDA and has been authorized for detection and/or diagnosis of SARS-CoV-2 by FDA under an Emergency  Use Authorization (EUA). This EUA will remain in effect (meaning this test can be used) for the duration of the COVID-19 declaration under Section 564(b)(1) of the Act, 21 U.S.C. section 360bbb-3(b)(1), unless the authorization is terminated or revoked.  Performed at [facility], [place] [address], [place], [place] [address]     Radiology Studies: DG Chest 2 View  Result Date: 07/22/2021 CLINICAL DATA:  Shortness of breath after liver biopsy. EXAM: CHEST - 2 VIEW COMPARISON:  None. FINDINGS: The heart size and mediastinal contours are within normal limits. Right lung is clear. Minimal left lingular subsegmental atelectasis or scarring is noted. The visualized skeletal structures are unremarkable. IMPRESSION: Minimal left lingular subsegmental atelectasis or scarring. Electronically Signed   By: Marijo Conception M.D.   On: 07/22/2021 15:12   CT Head Wo Contrast  Result Date: 07/22/2021 CLINICAL DATA:  Head trauma moderate to severe. Brain Mets suspected. EXAM: CT HEAD WITHOUT CONTRAST TECHNIQUE:  Contiguous axial images were obtained from the base of the skull through the vertex without intravenous contrast. COMPARISON:  None. FINDINGS: Brain: No evidence of acute infarction, hemorrhage, hydrocephalus, extra-axial collection or mass lesion/mass effect. Vascular: No hyperdense vessel or unexpected calcification. Skull: Normal. Negative for fracture or focal lesion. Sinuses/Orbits: No acute finding. Other: None. IMPRESSION: No acute intracranial abnormality. No appreciable mass on this noncontrast enhanced examination. Electronically Signed   By: Keane Police D.O.   On: 07/22/2021 15:12   IR Angiogram Visceral Selective  Result Date: 07/23/2021 CLINICAL DATA:  Recent percutaneous liver biopsy, now with increasing abdominal ascites and pericapsular pseudoaneurysm. EXAM: SELECTIVE VISCERAL ARTERIOGRAPHY; IR ULTRASOUND GUIDANCE VASC ACCESS RIGHT; IR EMBO ART VEN HEMORR LYMPH EXTRAV INC GUIDE ROADMAPPING; ADDITIONAL ARTERIOGRAPHY ANESTHESIA/SEDATION: Intravenous Fentanyl 122mcg and Versed 1.5mg  were administered as conscious sedation during continuous monitoring of the patient's level of consciousness and physiological / cardiorespiratory status by the radiology RN, with a total moderate sedation time of 86 minutes. MEDICATIONS: Lidocaine 1% subcutaneous CONTRAST:  75mL OMNIPAQUE IOHEXOL 300 MG/ML  SOLN PROCEDURE: The procedure, risks (including but not limited to bleeding, infection, organ damage ), benefits, and alternatives were explained to the patient. Questions regarding the procedure were encouraged and answered. The patient understands and consents to the procedure. Right femoral region prepped and draped in usual sterile fashion. Maximal barrier sterile technique was utilized including caps, mask, sterile gowns, sterile gloves, sterile drape, hand hygiene and skin antiseptic. The right common femoral artery was localized under ultrasound. Under real-time ultrasound guidance, the vessel was accessed  with a 21-gauge micropuncture needle, exchanged over a 018 guidewire for a transitional dilator, through which a 035 guidewire was advanced. Over this, a 5 [place] vascular sheath was placed, through which a 5 [place] C2 catheter was advanced and used to selectively catheterize the celiac axis for selective arteriography in multiple projections. The C2 was then utilized to selectively catheterize the common hepatic artery artery for selective arteriography in multiple projections. The C2 was then utilized to selectively catheterize the right hepatic artery for selective arteriography. Coaxial Renegade microcatheter was advanced with a 016 guidewire and used for selective catheterization of a peripheral right hepatic arterial branch supplying the pseudoaneurysm. Once the catheter was positioned as peripherally is possible, a single 2 mm X 2 cm interlock coil was deployed. After 4 minutes, confirmatory arteriogram was obtained showing no further opacification of the pseudoaneurysm. The microcatheter, catheter and sheath were removed and hemostasis achieved with the aid of the Exoseal device after confirmatory femoral arteriography. The  patient tolerated the procedure well. COMPLICATIONS: None immediate FINDINGS: Continued opacification of extracapsular pseudoaneurysm from the right hepatic lobe supplied by small right hepatic arterial branch. Technically successful coil embolization of a peripheral feeding branch, with no further opacification of the pseudoaneurysm. IMPRESSION: 1. Technically successful coil embolization of the feeding branch to right hepatic arterial pericapsular pseudoaneurysm. Electronically Signed   By: Lucrezia Europe M.D.   On: 07/23/2021 08:25   IR Angiogram Selective Each Additional Vessel  Result Date: 07/23/2021 CLINICAL DATA:  Recent percutaneous liver biopsy, now with increasing abdominal ascites and pericapsular pseudoaneurysm. EXAM: SELECTIVE VISCERAL ARTERIOGRAPHY; IR ULTRASOUND GUIDANCE  VASC ACCESS RIGHT; IR EMBO ART VEN HEMORR LYMPH EXTRAV INC GUIDE ROADMAPPING; ADDITIONAL ARTERIOGRAPHY ANESTHESIA/SEDATION: Intravenous Fentanyl 187mcg and Versed 1.5mg  were administered as conscious sedation during continuous monitoring of the patient's level of consciousness and physiological / cardiorespiratory status by the radiology RN, with a total moderate sedation time of 86 minutes. MEDICATIONS: Lidocaine 1% subcutaneous CONTRAST:  15mL OMNIPAQUE IOHEXOL 300 MG/ML  SOLN PROCEDURE: The procedure, risks (including but not limited to bleeding, infection, organ damage ), benefits, and alternatives were explained to the patient. Questions regarding the procedure were encouraged and answered. The patient understands and consents to the procedure. Right femoral region prepped and draped in usual sterile fashion. Maximal barrier sterile technique was utilized including caps, mask, sterile gowns, sterile gloves, sterile drape, hand hygiene and skin antiseptic. The right common femoral artery was localized under ultrasound. Under real-time ultrasound guidance, the vessel was accessed with a 21-gauge micropuncture needle, exchanged over a 018 guidewire for a transitional dilator, through which a 035 guidewire was advanced. Over this, a 5 [place] vascular sheath was placed, through which a 5 [place] C2 catheter was advanced and used to selectively catheterize the celiac axis for selective arteriography in multiple projections. The C2 was then utilized to selectively catheterize the common hepatic artery artery for selective arteriography in multiple projections. The C2 was then utilized to selectively catheterize the right hepatic artery for selective arteriography. Coaxial Renegade microcatheter was advanced with a 016 guidewire and used for selective catheterization of a peripheral right hepatic arterial branch supplying the pseudoaneurysm. Once the catheter was positioned as peripherally is possible, a single 2 mm X 2  cm interlock coil was deployed. After 4 minutes, confirmatory arteriogram was obtained showing no further opacification of the pseudoaneurysm. The microcatheter, catheter and sheath were removed and hemostasis achieved with the aid of the Exoseal device after confirmatory femoral arteriography. The patient tolerated the procedure well. COMPLICATIONS: None immediate FINDINGS: Continued opacification of extracapsular pseudoaneurysm from the right hepatic lobe supplied by small right hepatic arterial branch. Technically successful coil embolization of a peripheral feeding branch, with no further opacification of the pseudoaneurysm. IMPRESSION: 1. Technically successful coil embolization of the feeding branch to right hepatic arterial pericapsular pseudoaneurysm. Electronically Signed   By: Lucrezia Europe M.D.   On: 07/23/2021 08:25   IR Angiogram Selective Each Additional Vessel  Result Date: 07/23/2021 CLINICAL DATA:  Recent percutaneous liver biopsy, now with increasing abdominal ascites and pericapsular pseudoaneurysm. EXAM: SELECTIVE VISCERAL ARTERIOGRAPHY; IR ULTRASOUND GUIDANCE VASC ACCESS RIGHT; IR EMBO ART VEN HEMORR LYMPH EXTRAV INC GUIDE ROADMAPPING; ADDITIONAL ARTERIOGRAPHY ANESTHESIA/SEDATION: Intravenous Fentanyl 170mcg and Versed 1.5mg  were administered as conscious sedation during continuous monitoring of the patient's level of consciousness and physiological / cardiorespiratory status by the radiology RN, with a total moderate sedation time of 86 minutes. MEDICATIONS: Lidocaine 1% subcutaneous CONTRAST:  38mL OMNIPAQUE IOHEXOL 300 MG/ML  SOLN  PROCEDURE: The procedure, risks (including but not limited to bleeding, infection, organ damage ), benefits, and alternatives were explained to the patient. Questions regarding the procedure were encouraged and answered. The patient understands and consents to the procedure. Right femoral region prepped and draped in usual sterile fashion. Maximal barrier sterile  technique was utilized including caps, mask, sterile gowns, sterile gloves, sterile drape, hand hygiene and skin antiseptic. The right common femoral artery was localized under ultrasound. Under real-time ultrasound guidance, the vessel was accessed with a 21-gauge micropuncture needle, exchanged over a 018 guidewire for a transitional dilator, through which a 035 guidewire was advanced. Over this, a 5 [place] vascular sheath was placed, through which a 5 [place] C2 catheter was advanced and used to selectively catheterize the celiac axis for selective arteriography in multiple projections. The C2 was then utilized to selectively catheterize the common hepatic artery artery for selective arteriography in multiple projections. The C2 was then utilized to selectively catheterize the right hepatic artery for selective arteriography. Coaxial Renegade microcatheter was advanced with a 016 guidewire and used for selective catheterization of a peripheral right hepatic arterial branch supplying the pseudoaneurysm. Once the catheter was positioned as peripherally is possible, a single 2 mm X 2 cm interlock coil was deployed. After 4 minutes, confirmatory arteriogram was obtained showing no further opacification of the pseudoaneurysm. The microcatheter, catheter and sheath were removed and hemostasis achieved with the aid of the Exoseal device after confirmatory femoral arteriography. The patient tolerated the procedure well. COMPLICATIONS: None immediate FINDINGS: Continued opacification of extracapsular pseudoaneurysm from the right hepatic lobe supplied by small right hepatic arterial branch. Technically successful coil embolization of a peripheral feeding branch, with no further opacification of the pseudoaneurysm. IMPRESSION: 1. Technically successful coil embolization of the feeding branch to right hepatic arterial pericapsular pseudoaneurysm. Electronically Signed   By: Lucrezia Europe M.D.   On: 07/23/2021 08:25   IR  Angiogram Selective Each Additional Vessel  Result Date: 07/23/2021 CLINICAL DATA:  Recent percutaneous liver biopsy, now with increasing abdominal ascites and pericapsular pseudoaneurysm. EXAM: SELECTIVE VISCERAL ARTERIOGRAPHY; IR ULTRASOUND GUIDANCE VASC ACCESS RIGHT; IR EMBO ART VEN HEMORR LYMPH EXTRAV INC GUIDE ROADMAPPING; ADDITIONAL ARTERIOGRAPHY ANESTHESIA/SEDATION: Intravenous Fentanyl 132mcg and Versed 1.5mg  were administered as conscious sedation during continuous monitoring of the patient's level of consciousness and physiological / cardiorespiratory status by the radiology RN, with a total moderate sedation time of 86 minutes. MEDICATIONS: Lidocaine 1% subcutaneous CONTRAST:  41mL OMNIPAQUE IOHEXOL 300 MG/ML  SOLN PROCEDURE: The procedure, risks (including but not limited to bleeding, infection, organ damage ), benefits, and alternatives were explained to the patient. Questions regarding the procedure were encouraged and answered. The patient understands and consents to the procedure. Right femoral region prepped and draped in usual sterile fashion. Maximal barrier sterile technique was utilized including caps, mask, sterile gowns, sterile gloves, sterile drape, hand hygiene and skin antiseptic. The right common femoral artery was localized under ultrasound. Under real-time ultrasound guidance, the vessel was accessed with a 21-gauge micropuncture needle, exchanged over a 018 guidewire for a transitional dilator, through which a 035 guidewire was advanced. Over this, a 5 [place] vascular sheath was placed, through which a 5 [place] C2 catheter was advanced and used to selectively catheterize the celiac axis for selective arteriography in multiple projections. The C2 was then utilized to selectively catheterize the common hepatic artery artery for selective arteriography in multiple projections. The C2 was then utilized to selectively catheterize the right hepatic artery for selective arteriography.  Coaxial Renegade microcatheter was advanced with a 016 guidewire and used for selective catheterization of a peripheral right hepatic arterial branch supplying the pseudoaneurysm. Once the catheter was positioned as peripherally is possible, a single 2 mm X 2 cm interlock coil was deployed. After 4 minutes, confirmatory arteriogram was obtained showing no further opacification of the pseudoaneurysm. The microcatheter, catheter and sheath were removed and hemostasis achieved with the aid of the Exoseal device after confirmatory femoral arteriography. The patient tolerated the procedure well. COMPLICATIONS: None immediate FINDINGS: Continued opacification of extracapsular pseudoaneurysm from the right hepatic lobe supplied by small right hepatic arterial branch. Technically successful coil embolization of a peripheral feeding branch, with no further opacification of the pseudoaneurysm. IMPRESSION: 1. Technically successful coil embolization of the feeding branch to right hepatic arterial pericapsular pseudoaneurysm. Electronically Signed   By: Lucrezia Europe M.D.   On: 07/23/2021 08:25   IR US Guide Vasc Access Right  Result Date: 07/23/2021 CLINICAL DATA:  Recent percutaneous liver biopsy, now with increasing abdominal ascites and pericapsular pseudoaneurysm. EXAM: SELECTIVE VISCERAL ARTERIOGRAPHY; IR ULTRASOUND GUIDANCE VASC ACCESS RIGHT; IR EMBO ART VEN HEMORR LYMPH EXTRAV INC GUIDE ROADMAPPING; ADDITIONAL ARTERIOGRAPHY ANESTHESIA/SEDATION: Intravenous Fentanyl 158mcg and Versed 1.5mg  were administered as conscious sedation during continuous monitoring of the patient's level of consciousness and physiological / cardiorespiratory status by the radiology RN, with a total moderate sedation time of 86 minutes. MEDICATIONS: Lidocaine 1% subcutaneous CONTRAST:  63mL OMNIPAQUE IOHEXOL 300 MG/ML  SOLN PROCEDURE: The procedure, risks (including but not limited to bleeding, infection, organ damage ), benefits, and  alternatives were explained to the patient. Questions regarding the procedure were encouraged and answered. The patient understands and consents to the procedure. Right femoral region prepped and draped in usual sterile fashion. Maximal barrier sterile technique was utilized including caps, mask, sterile gowns, sterile gloves, sterile drape, hand hygiene and skin antiseptic. The right common femoral artery was localized under ultrasound. Under real-time ultrasound guidance, the vessel was accessed with a 21-gauge micropuncture needle, exchanged over a 018 guidewire for a transitional dilator, through which a 035 guidewire was advanced. Over this, a 5 [place] vascular sheath was placed, through which a 5 [place] C2 catheter was advanced and used to selectively catheterize the celiac axis for selective arteriography in multiple projections. The C2 was then utilized to selectively catheterize the common hepatic artery artery for selective arteriography in multiple projections. The C2 was then utilized to selectively catheterize the right hepatic artery for selective arteriography. Coaxial Renegade microcatheter was advanced with a 016 guidewire and used for selective catheterization of a peripheral right hepatic arterial branch supplying the pseudoaneurysm. Once the catheter was positioned as peripherally is possible, a single 2 mm X 2 cm interlock coil was deployed. After 4 minutes, confirmatory arteriogram was obtained showing no further opacification of the pseudoaneurysm. The microcatheter, catheter and sheath were removed and hemostasis achieved with the aid of the Exoseal device after confirmatory femoral arteriography. The patient tolerated the procedure well. COMPLICATIONS: None immediate FINDINGS: Continued opacification of extracapsular pseudoaneurysm from the right hepatic lobe supplied by small right hepatic arterial branch. Technically successful coil embolization of a peripheral feeding branch, with no  further opacification of the pseudoaneurysm. IMPRESSION: 1. Technically successful coil embolization of the feeding branch to right hepatic arterial pericapsular pseudoaneurysm. Electronically Signed   By: Lucrezia Europe M.D.   On: 07/23/2021 08:25   IR EMBO ART  VEN HEMORR LYMPH EXTRAV  INC GUIDE ROADMAPPING  Result Date: 07/23/2021 CLINICAL DATA:  Recent percutaneous liver biopsy, now with increasing abdominal ascites and pericapsular pseudoaneurysm. EXAM: SELECTIVE VISCERAL ARTERIOGRAPHY; IR ULTRASOUND GUIDANCE VASC ACCESS RIGHT; IR EMBO ART VEN HEMORR LYMPH EXTRAV INC GUIDE ROADMAPPING; ADDITIONAL ARTERIOGRAPHY ANESTHESIA/SEDATION: Intravenous Fentanyl 196mcg and Versed 1.5mg  were administered as conscious sedation during continuous monitoring of the patient's level of consciousness and physiological / cardiorespiratory status by the radiology RN, with a total moderate sedation time of 86 minutes. MEDICATIONS: Lidocaine 1% subcutaneous CONTRAST:  30mL OMNIPAQUE IOHEXOL 300 MG/ML  SOLN PROCEDURE: The procedure, risks (including but not limited to bleeding, infection, organ damage ), benefits, and alternatives were explained to the patient. Questions regarding the procedure were encouraged and answered. The patient understands and consents to the procedure. Right femoral region prepped and draped in usual sterile fashion. Maximal barrier sterile technique was utilized including caps, mask, sterile gowns, sterile gloves, sterile drape, hand hygiene and skin antiseptic. The right common femoral artery was localized under ultrasound. Under real-time ultrasound guidance, the vessel was accessed with a 21-gauge micropuncture needle, exchanged over a 018 guidewire for a transitional dilator, through which a 035 guidewire was advanced. Over this, a 5 [place] vascular sheath was placed, through which a 5 [place] C2 catheter was advanced and used to selectively catheterize the celiac axis for selective arteriography in  multiple projections. The C2 was then utilized to selectively catheterize the common hepatic artery artery for selective arteriography in multiple projections. The C2 was then utilized to selectively catheterize the right hepatic artery for selective arteriography. Coaxial Renegade microcatheter was advanced with a 016 guidewire and used for selective catheterization of a peripheral right hepatic arterial branch supplying the pseudoaneurysm. Once the catheter was positioned as peripherally is possible, a single 2 mm X 2 cm interlock coil was deployed. After 4 minutes, confirmatory arteriogram was obtained showing no further opacification of the pseudoaneurysm. The microcatheter, catheter and sheath were removed and hemostasis achieved with the aid of the Exoseal device after confirmatory femoral arteriography. The patient tolerated the procedure well. COMPLICATIONS: None immediate FINDINGS: Continued opacification of extracapsular pseudoaneurysm from the right hepatic lobe supplied by small right hepatic arterial branch. Technically successful coil embolization of a peripheral feeding branch, with no further opacification of the pseudoaneurysm. IMPRESSION: 1. Technically successful coil embolization of the feeding branch to right hepatic arterial pericapsular pseudoaneurysm. Electronically Signed   By: Lucrezia Europe M.D.   On: 07/23/2021 08:25   CT Angio Abd/Pel W and/or Wo Contrast  Result Date: 07/22/2021 CLINICAL DATA:  Recent liver biopsy. Free fluid on bedside ultrasound. Evaluate for liver bleed. EXAM: CT ANGIOGRAPHY ABDOMEN AND PELVIS WITH CONTRAST AND WITHOUT CONTRAST TECHNIQUE: Multidetector CT imaging of the abdomen and pelvis was performed using the standard protocol during bolus administration of intravenous contrast. Multiplanar reconstructed images and MIPs were obtained and reviewed to evaluate the vascular anatomy. CONTRAST:  129mL OMNIPAQUE IOHEXOL 350 MG/ML SOLN COMPARISON:  06/12/2021 FINDINGS:  VASCULAR Aorta: Atherosclerotic calcifications in the abdominal aorta without aneurysm, dissection or significant stenosis. Celiac: Patent without evidence of aneurysm, dissection, vasculitis or significant stenosis. Variant anatomy with an accessory left hepatic artery coming off the left gastric artery. There is focal contrast extravasation just lateral to the right hepatic lobe on the arterial phase imaging on sequence 6, image 71. This is well seen on the coronal images, sequence 7, image 73. This is a relatively well-defined elongated structure on the arterial coronal arterial images, measuring up to 1.6 cm. This area does not significantly change in size on the delayed  images and there is no clear evidence for contrast pooling outside of this isolated elongated structure. Finding is unusual but could represent apseudoaneurysm near the liver capsule. SMA: Patent without evidence of aneurysm, dissection, vasculitis or significant stenosis. Renals: Both renal arteries are patent without evidence of aneurysm, dissection, vasculitis, fibromuscular dysplasia or significant stenosis. IMA: Patent without evidence of aneurysm, dissection, vasculitis or significant stenosis. Inflow: Patent without evidence of aneurysm, dissection, vasculitis or significant stenosis. Proximal Outflow: Proximal femoral arteries are patent bilaterally. Veins: IVC and renal veins are patent. Main portal venous system appears to be patent but limited evaluation of intrahepatic portal veins. There is enlarged and umbilical vein along the anterior abdomen. Iliac veins are patent. Review of the MIP images confirms the above findings. NON-VASCULAR Lower chest: Bilateral breast implants. No pleural effusions. Patchy densities at lung bases likely represent atelectasis. Hepatobiliary: Liver is heterogeneous. No large discrete liver lesions. Normal appearance of the gallbladder. No significant biliary dilatation. Pancreas: Unremarkable. No  pancreatic ductal dilatation or surrounding inflammatory changes. Spleen: Normal in size without focal abnormality. Adrenals/Urinary Tract: Normal appearance of the adrenal glands. Normal appearance of the right kidney. Left renal cysts. Negative for hydronephrosis. Small amount of fluid in the urinary bladder. Stomach/Bowel: Normal appearance of the stomach. There is no evidence for bowel distention or focal bowel inflammation. Lymphatic: No lymph node enlargement in the abdomen or pelvis. Reproductive: Uterus and bilateral adnexa are unremarkable. Other: Moderate amount of ascites in the abdomen and pelvis. Hounsfield units of the perihepatic ascites is relatively low density measuring between 11-13. Fluid in the cul-de-sac measures approximately 24. Most dense ascites is in the right lower quadrant measuring roughly 26 Hounsfield units. The ascites has increased from the PET-CT on 07/05/2021. Negative for free air. Musculoskeletal: No acute bone abnormality. IMPRESSION: VASCULAR 1. Focal area of contrast extravasation just lateral to the liver capsule in the right hepatic lobe. This location corresponds with the recent biopsy site. This area of contrast extravasation may be contained because it does not significantly enlarge on the delayed images. This could represent an elongated pseudoaneurysm along the capsule measuring roughly 1.6 cm. Dedicated ultrasound of this area with duplex imaging may better evaluate this area. 2.  Aortic Atherosclerosis (ICD10-I70.0). 3. Persistent enlarged umbilical vein. Finding raises concern for underlying portal hypertension. NON-VASCULAR 1. Increased free fluid in the abdomen and pelvis compared to 07/05/2021. Hounsfield units are slightly dense but more dense than the previous PET-CT. Suspect this fluid represents a combination of blood and underlying ascites. 2. Again noted is a heterogeneous liver without a discrete lesion. These results were called by telephone at the time  of interpretation on 07/22/2021 at 4:28 pm to provider [facility] , who verbally acknowledged these results. Electronically Signed   By: Markus Daft M.D.   On: 07/22/2021 16:51    Scheduled Meds:  Chlorhexidine Gluconate Cloth  6 each Topical Daily   docusate sodium  100 mg Oral BID   lactulose  20 g Oral TID   mouth rinse  15 mL Mouth Rinse BID   Continuous Infusions:  sodium chloride Stopped (07/22/21 1758)   ceFEPime (MAXIPIME) IV Stopped (07/23/21 0816)   metronidazole Stopped (07/23/21 9147)   sodium phosphate  Dextrose 5% IVPB 30 mmol (07/23/21 0920)   vancomycin       LOS: 1 day    Time spent: 50 mins    Yechezkel Fertig, MD [facility]   If 7PM-7AM, please contact night-coverage

## 2021-07-23 NOTE — Progress Notes (Signed)
Referring Physician(s): Horton,K  Supervising Physician: Markus Daft  Patient Status:  [facility] - In-pt  Chief Complaint: Abdominal pain, ascites, bleed post liver biopsy   Subjective: Pt still feeling tired, cont to have abd distension/discomfort; denies fever/chills,N/V, resp issues; did have loose dark BM- on lactulose   Allergies: Ampicillin, Dilaudid [hydromorphone hcl], Erythromycin, Penicillins, Sulfa drugs cross reactors, and Declomycin [demeclocycline]  Medications: Prior to Admission medications   Medication Sig Start Date End Date Taking? Authorizing Provider  albuterol (PROVENTIL HFA;VENTOLIN HFA) 108 (90 BASE) MCG/ACT inhaler Inhale 1-2 puffs into the lungs every 6 (six) hours as needed for wheezing or shortness of breath.    Yes [provider]  ALPRAZolam Duanne Moron) 0.5 MG tablet Take 0.5 mg by mouth daily as needed for anxiety.   Yes [provider]  atenolol-chlorthalidone (TENORETIC) 50-25 MG per tablet Take 1 tablet by mouth daily.     Yes [provider]  EPINEPHrine 0.3 mg/0.3 mL IJ SOAJ injection Inject 0.3 mg into the muscle once as needed for anaphylaxis.   Yes [provider]  fluticasone furoate-vilanterol (BREO ELLIPTA) 200-25 MCG/INH AEPB Inhale 1 puff into the lungs daily as needed (shortness of breath).   Yes [provider]  letrozole (FEMARA) 2.5 MG tablet Take 1 tablet (2.5 mg total) by mouth daily. 11/27/20  Yes Nicholas Lose, MD  montelukast (SINGULAIR) 10 MG tablet Take 10 mg by mouth daily.    Yes [provider]  omeprazole (PRILOSEC OTC) 20 MG tablet Take 20 mg by mouth daily.   Yes [provider]  Phenylephrine-DM-GG-APAP (MUCINEX SINUS-MAX) 5-10-200-325 MG TABS Take 2 tablets by mouth daily as needed (congestion).   Yes [provider]  potassium chloride SA (KLOR-CON) 20 MEQ tablet Take 20 mEq by mouth daily.    Yes [provider]     Vital Signs: BP 136/76     Pulse (!) 109    Temp 98.2 F (36.8 C) (Oral)    Resp 19    Ht 5\' 6"  (1.676 m)    Wt 189 lb (85.7 kg)    SpO2 96%    BMI 30.51 kg/m   Physical Exam awake/answers questions ok; appears tired; sister in room; abdomen remains distended, puncture site right common femoral artery soft, clean, dry, nontender, no hematoma; no lower extremity edema, intact distal pulses  Imaging: DG Chest 2 View  Result Date: 07/22/2021 CLINICAL DATA:  Shortness of breath after liver biopsy. EXAM: CHEST - 2 VIEW COMPARISON:  None. FINDINGS: The heart size and mediastinal contours are within normal limits. Right lung is clear. Minimal left lingular subsegmental atelectasis or scarring is noted. The visualized skeletal structures are unremarkable. IMPRESSION: Minimal left lingular subsegmental atelectasis or scarring. Electronically Signed   By: Marijo Conception M.D.   On: 07/22/2021 15:12   CT Head Wo Contrast  Result Date: 07/22/2021 CLINICAL DATA:  Head trauma moderate to severe. Brain Mets suspected. EXAM: CT HEAD WITHOUT CONTRAST TECHNIQUE: Contiguous axial images were obtained from the base of the skull through the vertex without intravenous contrast. COMPARISON:  None. FINDINGS: Brain: No evidence of acute infarction, hemorrhage, hydrocephalus, extra-axial collection or mass lesion/mass effect. Vascular: No hyperdense vessel or unexpected calcification. Skull: Normal. Negative for fracture or focal lesion. Sinuses/Orbits: No acute finding. Other: None. IMPRESSION: No acute intracranial abnormality. No appreciable mass on this noncontrast enhanced examination. Electronically Signed   By: Keane Police D.O.   On: 07/22/2021 15:12   IR Angiogram Visceral Selective  Result Date: 07/23/2021 CLINICAL DATA:  Recent percutaneous liver biopsy, now with increasing abdominal ascites and pericapsular pseudoaneurysm. EXAM: SELECTIVE VISCERAL ARTERIOGRAPHY; IR ULTRASOUND GUIDANCE VASC ACCESS RIGHT; IR EMBO ART VEN HEMORR LYMPH EXTRAV  INC GUIDE ROADMAPPING; ADDITIONAL ARTERIOGRAPHY ANESTHESIA/SEDATION: Intravenous Fentanyl 124mcg and Versed 1.5mg  were administered as conscious sedation during continuous monitoring of the patient's level of consciousness and physiological / cardiorespiratory status by the radiology RN, with a total moderate sedation time of 86 minutes. MEDICATIONS: Lidocaine 1% subcutaneous CONTRAST:  38mL OMNIPAQUE IOHEXOL 300 MG/ML  SOLN PROCEDURE: The procedure, risks (including but not limited to bleeding, infection, organ damage ), benefits, and alternatives were explained to the patient. Questions regarding the procedure were encouraged and answered. The patient understands and consents to the procedure. Right femoral region prepped and draped in usual sterile fashion. Maximal barrier sterile technique was utilized including caps, mask, sterile gowns, sterile gloves, sterile drape, hand hygiene and skin antiseptic. The right common femoral artery was localized under ultrasound. Under real-time ultrasound guidance, the vessel was accessed with a 21-gauge micropuncture needle, exchanged over a 018 guidewire for a transitional dilator, through which a 035 guidewire was advanced. Over this, a 5 [place] vascular sheath was placed, through which a 5 [place] C2 catheter was advanced and used to selectively catheterize the celiac axis for selective arteriography in multiple projections. The C2 was then utilized to selectively catheterize the common hepatic artery artery for selective arteriography in multiple projections. The C2 was then utilized to selectively catheterize the right hepatic artery for selective arteriography. Coaxial Renegade microcatheter was advanced with a 016 guidewire and used for selective catheterization of a peripheral right hepatic arterial branch supplying the pseudoaneurysm. Once the catheter was positioned as peripherally is possible, a single 2 mm X 2 cm interlock coil was deployed. After 4 minutes,  confirmatory arteriogram was obtained showing no further opacification of the pseudoaneurysm. The microcatheter, catheter and sheath were removed and hemostasis achieved with the aid of the Exoseal device after confirmatory femoral arteriography. The patient tolerated the procedure well. COMPLICATIONS: None immediate FINDINGS: Continued opacification of extracapsular pseudoaneurysm from the right hepatic lobe supplied by small right hepatic arterial branch. Technically successful coil embolization of a peripheral feeding branch, with no further opacification of the pseudoaneurysm. IMPRESSION: 1. Technically successful coil embolization of the feeding branch to right hepatic arterial pericapsular pseudoaneurysm. Electronically Signed   By: Lucrezia Europe M.D.   On: 07/23/2021 08:25   IR Angiogram Selective Each Additional Vessel  Result Date: 07/23/2021 CLINICAL DATA:  Recent percutaneous liver biopsy, now with increasing abdominal ascites and pericapsular pseudoaneurysm. EXAM: SELECTIVE VISCERAL ARTERIOGRAPHY; IR ULTRASOUND GUIDANCE VASC ACCESS RIGHT; IR EMBO ART VEN HEMORR LYMPH EXTRAV INC GUIDE ROADMAPPING; ADDITIONAL ARTERIOGRAPHY ANESTHESIA/SEDATION: Intravenous Fentanyl 167mcg and Versed 1.5mg  were administered as conscious sedation during continuous monitoring of the patient's level of consciousness and physiological / cardiorespiratory status by the radiology RN, with a total moderate sedation time of 86 minutes. MEDICATIONS: Lidocaine 1% subcutaneous CONTRAST:  79mL OMNIPAQUE IOHEXOL 300 MG/ML  SOLN PROCEDURE: The procedure, risks (including but not limited to bleeding, infection, organ damage ), benefits, and alternatives were explained to the patient. Questions regarding the procedure were encouraged and answered. The patient understands and consents to the procedure. Right femoral region prepped and draped in usual sterile fashion. Maximal barrier sterile technique was utilized including caps, mask,  sterile gowns, sterile gloves, sterile drape, hand hygiene and skin antiseptic. The right common femoral artery was localized under ultrasound. Under real-time ultrasound  guidance, the vessel was accessed with a 21-gauge micropuncture needle, exchanged over a 018 guidewire for a transitional dilator, through which a 035 guidewire was advanced. Over this, a 5 [place] vascular sheath was placed, through which a 5 [place] C2 catheter was advanced and used to selectively catheterize the celiac axis for selective arteriography in multiple projections. The C2 was then utilized to selectively catheterize the common hepatic artery artery for selective arteriography in multiple projections. The C2 was then utilized to selectively catheterize the right hepatic artery for selective arteriography. Coaxial Renegade microcatheter was advanced with a 016 guidewire and used for selective catheterization of a peripheral right hepatic arterial branch supplying the pseudoaneurysm. Once the catheter was positioned as peripherally is possible, a single 2 mm X 2 cm interlock coil was deployed. After 4 minutes, confirmatory arteriogram was obtained showing no further opacification of the pseudoaneurysm. The microcatheter, catheter and sheath were removed and hemostasis achieved with the aid of the Exoseal device after confirmatory femoral arteriography. The patient tolerated the procedure well. COMPLICATIONS: None immediate FINDINGS: Continued opacification of extracapsular pseudoaneurysm from the right hepatic lobe supplied by small right hepatic arterial branch. Technically successful coil embolization of a peripheral feeding branch, with no further opacification of the pseudoaneurysm. IMPRESSION: 1. Technically successful coil embolization of the feeding branch to right hepatic arterial pericapsular pseudoaneurysm. Electronically Signed   By: Lucrezia Europe M.D.   On: 07/23/2021 08:25   IR Angiogram Selective Each Additional  Vessel  Result Date: 07/23/2021 CLINICAL DATA:  Recent percutaneous liver biopsy, now with increasing abdominal ascites and pericapsular pseudoaneurysm. EXAM: SELECTIVE VISCERAL ARTERIOGRAPHY; IR ULTRASOUND GUIDANCE VASC ACCESS RIGHT; IR EMBO ART VEN HEMORR LYMPH EXTRAV INC GUIDE ROADMAPPING; ADDITIONAL ARTERIOGRAPHY ANESTHESIA/SEDATION: Intravenous Fentanyl 155mcg and Versed 1.5mg  were administered as conscious sedation during continuous monitoring of the patient's level of consciousness and physiological / cardiorespiratory status by the radiology RN, with a total moderate sedation time of 86 minutes. MEDICATIONS: Lidocaine 1% subcutaneous CONTRAST:  53mL OMNIPAQUE IOHEXOL 300 MG/ML  SOLN PROCEDURE: The procedure, risks (including but not limited to bleeding, infection, organ damage ), benefits, and alternatives were explained to the patient. Questions regarding the procedure were encouraged and answered. The patient understands and consents to the procedure. Right femoral region prepped and draped in usual sterile fashion. Maximal barrier sterile technique was utilized including caps, mask, sterile gowns, sterile gloves, sterile drape, hand hygiene and skin antiseptic. The right common femoral artery was localized under ultrasound. Under real-time ultrasound guidance, the vessel was accessed with a 21-gauge micropuncture needle, exchanged over a 018 guidewire for a transitional dilator, through which a 035 guidewire was advanced. Over this, a 5 [place] vascular sheath was placed, through which a 5 [place] C2 catheter was advanced and used to selectively catheterize the celiac axis for selective arteriography in multiple projections. The C2 was then utilized to selectively catheterize the common hepatic artery artery for selective arteriography in multiple projections. The C2 was then utilized to selectively catheterize the right hepatic artery for selective arteriography. Coaxial Renegade microcatheter was  advanced with a 016 guidewire and used for selective catheterization of a peripheral right hepatic arterial branch supplying the pseudoaneurysm. Once the catheter was positioned as peripherally is possible, a single 2 mm X 2 cm interlock coil was deployed. After 4 minutes, confirmatory arteriogram was obtained showing no further opacification of the pseudoaneurysm. The microcatheter, catheter and sheath were removed and hemostasis achieved with the aid of the Exoseal device after confirmatory femoral arteriography. The patient  tolerated the procedure well. COMPLICATIONS: None immediate FINDINGS: Continued opacification of extracapsular pseudoaneurysm from the right hepatic lobe supplied by small right hepatic arterial branch. Technically successful coil embolization of a peripheral feeding branch, with no further opacification of the pseudoaneurysm. IMPRESSION: 1. Technically successful coil embolization of the feeding branch to right hepatic arterial pericapsular pseudoaneurysm. Electronically Signed   By: Lucrezia Europe M.D.   On: 07/23/2021 08:25   IR Angiogram Selective Each Additional Vessel  Result Date: 07/23/2021 CLINICAL DATA:  Recent percutaneous liver biopsy, now with increasing abdominal ascites and pericapsular pseudoaneurysm. EXAM: SELECTIVE VISCERAL ARTERIOGRAPHY; IR ULTRASOUND GUIDANCE VASC ACCESS RIGHT; IR EMBO ART VEN HEMORR LYMPH EXTRAV INC GUIDE ROADMAPPING; ADDITIONAL ARTERIOGRAPHY ANESTHESIA/SEDATION: Intravenous Fentanyl 117mcg and Versed 1.5mg  were administered as conscious sedation during continuous monitoring of the patient's level of consciousness and physiological / cardiorespiratory status by the radiology RN, with a total moderate sedation time of 86 minutes. MEDICATIONS: Lidocaine 1% subcutaneous CONTRAST:  23mL OMNIPAQUE IOHEXOL 300 MG/ML  SOLN PROCEDURE: The procedure, risks (including but not limited to bleeding, infection, organ damage ), benefits, and alternatives were explained  to the patient. Questions regarding the procedure were encouraged and answered. The patient understands and consents to the procedure. Right femoral region prepped and draped in usual sterile fashion. Maximal barrier sterile technique was utilized including caps, mask, sterile gowns, sterile gloves, sterile drape, hand hygiene and skin antiseptic. The right common femoral artery was localized under ultrasound. Under real-time ultrasound guidance, the vessel was accessed with a 21-gauge micropuncture needle, exchanged over a 018 guidewire for a transitional dilator, through which a 035 guidewire was advanced. Over this, a 5 [place] vascular sheath was placed, through which a 5 [place] C2 catheter was advanced and used to selectively catheterize the celiac axis for selective arteriography in multiple projections. The C2 was then utilized to selectively catheterize the common hepatic artery artery for selective arteriography in multiple projections. The C2 was then utilized to selectively catheterize the right hepatic artery for selective arteriography. Coaxial Renegade microcatheter was advanced with a 016 guidewire and used for selective catheterization of a peripheral right hepatic arterial branch supplying the pseudoaneurysm. Once the catheter was positioned as peripherally is possible, a single 2 mm X 2 cm interlock coil was deployed. After 4 minutes, confirmatory arteriogram was obtained showing no further opacification of the pseudoaneurysm. The microcatheter, catheter and sheath were removed and hemostasis achieved with the aid of the Exoseal device after confirmatory femoral arteriography. The patient tolerated the procedure well. COMPLICATIONS: None immediate FINDINGS: Continued opacification of extracapsular pseudoaneurysm from the right hepatic lobe supplied by small right hepatic arterial branch. Technically successful coil embolization of a peripheral feeding branch, with no further opacification of the  pseudoaneurysm. IMPRESSION: 1. Technically successful coil embolization of the feeding branch to right hepatic arterial pericapsular pseudoaneurysm. Electronically Signed   By: Lucrezia Europe M.D.   On: 07/23/2021 08:25   US BIOPSY (LIVER)  Result Date: 07/19/2021 INDICATION: History of breast cancer.  Concern for tumor replacement of liver. EXAM: ULTRASOUND GUIDED LIVER BIOPSY, NON-TARGETED COMPARISON:  CT chest abdomen pelvis, 06/12/2021. PET-CT, 07/06/2019 MEDICATIONS: None ANESTHESIA/SEDATION: Fentanyl 50 mcg IV; Versed 1 mg IV Total Moderate Sedation time: 20 minutes; The patient was continuously monitored during the procedure by the interventional radiology nurse under my direct supervision. COMPLICATIONS: None immediate. PROCEDURE: Informed written consent was obtained from the patient after a discussion of the risks, benefits and alternatives to treatment. The patient understands and consents the procedure. A timeout  was performed prior to the initiation of the procedure. Ultrasound scanning was performed of the right upper abdominal quadrant and the procedure was planned. The right upper abdomen was prepped and draped in the usual sterile fashion. The overlying soft tissues were anesthetized with 1% lidocaine with epinephrine. A 17 gauge, 6.8 cm co-axial needle was advanced into a peripheral aspect of the right lobe of the liver and 3 core biopsies were obtained with an 18 gauge core device under direct ultrasound guidance. The co-axial needle track was embolized with the administration of a Gel-Foam slurry. Superficial hemostasis was obtained with manual compression. Post procedural scanning was negative for definitive area of hemorrhage. A dressing was placed. The patient tolerated the procedure well without immediate post procedural complication. FINDINGS: Heterogeneous hepatic parenchymal echotexture, with nodular contour. IMPRESSION: Successful ultrasound-guided liver biopsy, as above. Michaelle Birks, MD  [facility] Electronically Signed   By: Michaelle Birks M.D.   On: 07/19/2021 16:14   IR US Guide Vasc Access Right  Result Date: 07/23/2021 CLINICAL DATA:  Recent percutaneous liver biopsy, now with increasing abdominal ascites and pericapsular pseudoaneurysm. EXAM: SELECTIVE VISCERAL ARTERIOGRAPHY; IR ULTRASOUND GUIDANCE VASC ACCESS RIGHT; IR EMBO ART VEN HEMORR LYMPH EXTRAV INC GUIDE ROADMAPPING; ADDITIONAL ARTERIOGRAPHY ANESTHESIA/SEDATION: Intravenous Fentanyl 161mcg and Versed 1.5mg  were administered as conscious sedation during continuous monitoring of the patient's level of consciousness and physiological / cardiorespiratory status by the radiology RN, with a total moderate sedation time of 86 minutes. MEDICATIONS: Lidocaine 1% subcutaneous CONTRAST:  38mL OMNIPAQUE IOHEXOL 300 MG/ML  SOLN PROCEDURE: The procedure, risks (including but not limited to bleeding, infection, organ damage ), benefits, and alternatives were explained to the patient. Questions regarding the procedure were encouraged and answered. The patient understands and consents to the procedure. Right femoral region prepped and draped in usual sterile fashion. Maximal barrier sterile technique was utilized including caps, mask, sterile gowns, sterile gloves, sterile drape, hand hygiene and skin antiseptic. The right common femoral artery was localized under ultrasound. Under real-time ultrasound guidance, the vessel was accessed with a 21-gauge micropuncture needle, exchanged over a 018 guidewire for a transitional dilator, through which a 035 guidewire was advanced. Over this, a 5 [place] vascular sheath was placed, through which a 5 [place] C2 catheter was advanced and used to selectively catheterize the celiac axis for selective arteriography in multiple projections. The C2 was then utilized to selectively catheterize the common hepatic artery artery for selective arteriography in  multiple projections. The C2 was then utilized to selectively catheterize the right hepatic artery for selective arteriography. Coaxial Renegade microcatheter was advanced with a 016 guidewire and used for selective catheterization of a peripheral right hepatic arterial branch supplying the pseudoaneurysm. Once the catheter was positioned as peripherally is possible, a single 2 mm X 2 cm interlock coil was deployed. After 4 minutes, confirmatory arteriogram was obtained showing no further opacification of the pseudoaneurysm. The microcatheter, catheter and sheath were removed and hemostasis achieved with the aid of the Exoseal device after confirmatory femoral arteriography. The patient tolerated the procedure well. COMPLICATIONS: None immediate FINDINGS: Continued opacification of extracapsular pseudoaneurysm from the right hepatic lobe supplied by small right hepatic arterial branch. Technically successful coil embolization of a peripheral feeding branch, with no further opacification of the pseudoaneurysm. IMPRESSION: 1. Technically successful coil embolization of the feeding branch to right hepatic arterial pericapsular pseudoaneurysm. Electronically Signed   By: Lucrezia Europe M.D.   On: 07/23/2021 08:25   IR EMBO ART  VEN HEMORR LYMPH EXTRAV  INC GUIDE ROADMAPPING  Result Date: 07/23/2021 CLINICAL DATA:  Recent percutaneous liver biopsy, now with increasing abdominal ascites and pericapsular pseudoaneurysm. EXAM: SELECTIVE VISCERAL ARTERIOGRAPHY; IR ULTRASOUND GUIDANCE VASC ACCESS RIGHT; IR EMBO ART VEN HEMORR LYMPH EXTRAV INC GUIDE ROADMAPPING; ADDITIONAL ARTERIOGRAPHY ANESTHESIA/SEDATION: Intravenous Fentanyl 187mcg and Versed 1.5mg  were administered as conscious sedation during continuous monitoring of the patient's level of consciousness and physiological / cardiorespiratory status by the radiology RN, with a total moderate sedation time of 86 minutes. MEDICATIONS: Lidocaine 1% subcutaneous CONTRAST:  12mL  OMNIPAQUE IOHEXOL 300 MG/ML  SOLN PROCEDURE: The procedure, risks (including but not limited to bleeding, infection, organ damage ), benefits, and alternatives were explained to the patient. Questions regarding the procedure were encouraged and answered. The patient understands and consents to the procedure. Right femoral region prepped and draped in usual sterile fashion. Maximal barrier sterile technique was utilized including caps, mask, sterile gowns, sterile gloves, sterile drape, hand hygiene and skin antiseptic. The right common femoral artery was localized under ultrasound. Under real-time ultrasound guidance, the vessel was accessed with a 21-gauge micropuncture needle, exchanged over a 018 guidewire for a transitional dilator, through which a 035 guidewire was advanced. Over this, a 5 [place] vascular sheath was placed, through which a 5 [place] C2 catheter was advanced and used to selectively catheterize the celiac axis for selective arteriography in multiple projections. The C2 was then utilized to selectively catheterize the common hepatic artery artery for selective arteriography in multiple projections. The C2 was then utilized to selectively catheterize the right hepatic artery for selective arteriography. Coaxial Renegade microcatheter was advanced with a 016 guidewire and used for selective catheterization of a peripheral right hepatic arterial branch supplying the pseudoaneurysm. Once the catheter was positioned as peripherally is possible, a single 2 mm X 2 cm interlock coil was deployed. After 4 minutes, confirmatory arteriogram was obtained showing no further opacification of the pseudoaneurysm. The microcatheter, catheter and sheath were removed and hemostasis achieved with the aid of the Exoseal device after confirmatory femoral arteriography. The patient tolerated the procedure well. COMPLICATIONS: None immediate FINDINGS: Continued opacification of extracapsular pseudoaneurysm from the right  hepatic lobe supplied by small right hepatic arterial branch. Technically successful coil embolization of a peripheral feeding branch, with no further opacification of the pseudoaneurysm. IMPRESSION: 1. Technically successful coil embolization of the feeding branch to right hepatic arterial pericapsular pseudoaneurysm. Electronically Signed   By: Lucrezia Europe M.D.   On: 07/23/2021 08:25   CT Angio Abd/Pel W and/or Wo Contrast  Result Date: 07/22/2021 CLINICAL DATA:  Recent liver biopsy. Free fluid on bedside ultrasound. Evaluate for liver bleed. EXAM: CT ANGIOGRAPHY ABDOMEN AND PELVIS WITH CONTRAST AND WITHOUT CONTRAST TECHNIQUE: Multidetector CT imaging of the abdomen and pelvis was performed using the standard protocol during bolus administration of intravenous contrast. Multiplanar reconstructed images and MIPs were obtained and reviewed to evaluate the vascular anatomy. CONTRAST:  161mL OMNIPAQUE IOHEXOL 350 MG/ML SOLN COMPARISON:  06/12/2021 FINDINGS: VASCULAR Aorta: Atherosclerotic calcifications in the abdominal aorta without aneurysm, dissection or significant stenosis. Celiac: Patent without evidence of aneurysm, dissection, vasculitis or significant stenosis. Variant anatomy with an accessory left hepatic artery coming off the left gastric artery. There is focal contrast extravasation just lateral to the right hepatic lobe on the arterial phase imaging on sequence 6, image 71. This is well seen on the coronal images, sequence 7, image 73. This is a relatively well-defined elongated structure on the arterial coronal arterial images, measuring  up to 1.6 cm. This area does not significantly change in size on the delayed images and there is no clear evidence for contrast pooling outside of this isolated elongated structure. Finding is unusual but could represent apseudoaneurysm near the liver capsule. SMA: Patent without evidence of aneurysm, dissection, vasculitis or significant stenosis. Renals: Both renal  arteries are patent without evidence of aneurysm, dissection, vasculitis, fibromuscular dysplasia or significant stenosis. IMA: Patent without evidence of aneurysm, dissection, vasculitis or significant stenosis. Inflow: Patent without evidence of aneurysm, dissection, vasculitis or significant stenosis. Proximal Outflow: Proximal femoral arteries are patent bilaterally. Veins: IVC and renal veins are patent. Main portal venous system appears to be patent but limited evaluation of intrahepatic portal veins. There is enlarged and umbilical vein along the anterior abdomen. Iliac veins are patent. Review of the MIP images confirms the above findings. NON-VASCULAR Lower chest: Bilateral breast implants. No pleural effusions. Patchy densities at lung bases likely represent atelectasis. Hepatobiliary: Liver is heterogeneous. No large discrete liver lesions. Normal appearance of the gallbladder. No significant biliary dilatation. Pancreas: Unremarkable. No pancreatic ductal dilatation or surrounding inflammatory changes. Spleen: Normal in size without focal abnormality. Adrenals/Urinary Tract: Normal appearance of the adrenal glands. Normal appearance of the right kidney. Left renal cysts. Negative for hydronephrosis. Small amount of fluid in the urinary bladder. Stomach/Bowel: Normal appearance of the stomach. There is no evidence for bowel distention or focal bowel inflammation. Lymphatic: No lymph node enlargement in the abdomen or pelvis. Reproductive: Uterus and bilateral adnexa are unremarkable. Other: Moderate amount of ascites in the abdomen and pelvis. Hounsfield units of the perihepatic ascites is relatively low density measuring between 11-13. Fluid in the cul-de-sac measures approximately 24. Most dense ascites is in the right lower quadrant measuring roughly 26 Hounsfield units. The ascites has increased from the PET-CT on 07/05/2021. Negative for free air. Musculoskeletal: No acute bone abnormality.  IMPRESSION: VASCULAR 1. Focal area of contrast extravasation just lateral to the liver capsule in the right hepatic lobe. This location corresponds with the recent biopsy site. This area of contrast extravasation may be contained because it does not significantly enlarge on the delayed images. This could represent an elongated pseudoaneurysm along the capsule measuring roughly 1.6 cm. Dedicated ultrasound of this area with duplex imaging may better evaluate this area. 2.  Aortic Atherosclerosis (ICD10-I70.0). 3. Persistent enlarged umbilical vein. Finding raises concern for underlying portal hypertension. NON-VASCULAR 1. Increased free fluid in the abdomen and pelvis compared to 07/05/2021. Hounsfield units are slightly dense but more dense than the previous PET-CT. Suspect this fluid represents a combination of blood and underlying ascites. 2. Again noted is a heterogeneous liver without a discrete lesion. These results were called by telephone at the time of interpretation on 07/22/2021 at 4:28 pm to provider MADISON [facility] , who verbally acknowledged these results. Electronically Signed   By: Markus Daft M.D.   On: 07/22/2021 16:51    Labs:  CBC: Recent Labs    07/22/21 1117 07/22/21 1519 07/23/21 0256 07/23/21 0904  WBC 20.0* 21.5* 24.6* 26.6*  HGB 10.5* 10.2* 8.4* 8.1*  HCT 30.8* 30.3* 26.1* 25.9*  PLT 171 173 173 181    COAGS: Recent Labs    07/19/21 1051 07/23/21 0256  INR 1.2 1.3*    BMP: Recent Labs    07/12/21 0937 07/18/21 0917 07/22/21 1117 07/23/21 0256  NA 134* 134* 130* 129*  K 3.2* 3.2* 3.4* 3.6  CL 103 102 98 99  CO2 23 24 22  19*  GLUCOSE 98 117* 120* 139*  BUN 16 15 19  25*  CALCIUM 11.9* 12.9* 11.9* 10.9*  CREATININE 0.75 0.79 0.79 0.91  GFRNONAA >60 >60 >60 >60    LIVER FUNCTION TESTS: Recent Labs    07/12/21 0937 07/18/21 0917 07/22/21 1117 07/23/21 0256  BILITOT 1.6* 2.6* 3.3* 3.4*  AST 70* 100* 120* 126*  ALT 35 49* 54* 56*  ALKPHOS 151* 142*  110 91  PROT 8.1 8.1 7.5 6.9  ALBUMIN 3.4* 3.4* 3.2* 2.7*    Assessment and Plan: 61 y.o. female with PMH sig for anxiety, GERD, HLD, HTN, IBS, right breast cancer with recent recurrence in the left breast (lobular carcinoma). She has had bilat mastectomies with reconstruction; She underwent image guided random right liver biopsy on 07/19/2021 at [facility]- path pending. Following her observation period she was discharged home in stable condition.  On 1/7 patient began to notice some increasing lethargy, as well as right upper quadrant/back discomfort, diminished appetite, dyspnea and constipation.  Since patient's symptoms persisted through the weekend she presented on 1/9 to [facility] ED. CT head was negative.  Chest x-ray with minimal left lingular subsegmental atelectasis or scarring.  CT angio abdomen pelvis today revealed: VASCULAR   1. Focal area of contrast extravasation just lateral to the liver capsule in the right hepatic lobe. This location corresponds with the recent biopsy site. This area of contrast extravasation may be contained because it does not significantly enlarge on the delayed images. This could represent an elongated pseudoaneurysm along the capsule measuring roughly 1.6 cm. Dedicated ultrasound of this area with duplex imaging may better evaluate this area. 2.  Aortic Atherosclerosis (ICD10-I70.0). 3. Persistent enlarged umbilical vein. Finding raises concern for underlying portal hypertension.   NON-VASCULAR   1. Increased free fluid in the abdomen and pelvis compared to 07/05/2021. Hounsfield units are slightly dense but more dense than the previous PET-CT. Suspect this fluid represents a combination of blood and underlying ascites. 2. 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     Patient ID: Romana Juniper, female   DOB: 10-20-1960, 61 y.o.   MRN: [id number]

## 2021-07-24 ENCOUNTER — Encounter: Payer: Self-pay | Admitting: Hematology and Oncology

## 2021-07-24 ENCOUNTER — Other Ambulatory Visit (HOSPITAL_COMMUNITY): Payer: Self-pay

## 2021-07-24 ENCOUNTER — Inpatient Hospital Stay (HOSPITAL_COMMUNITY): Payer: 59

## 2021-07-24 ENCOUNTER — Other Ambulatory Visit: Payer: Self-pay | Admitting: Hematology and Oncology

## 2021-07-24 DIAGNOSIS — R6520 Severe sepsis without septic shock: Secondary | ICD-10-CM

## 2021-07-24 DIAGNOSIS — A419 Sepsis, unspecified organism: Secondary | ICD-10-CM

## 2021-07-24 LAB — MAGNESIUM: Magnesium: 2.1 mg/dL (ref 1.7–2.4)

## 2021-07-24 LAB — BASIC METABOLIC PANEL
Anion gap: 7 (ref 5–15)
BUN: 27 mg/dL — ABNORMAL HIGH (ref 8–23)
CO2: 22 mmol/L (ref 22–32)
Calcium: 10.5 mg/dL — ABNORMAL HIGH (ref 8.9–10.3)
Chloride: 102 mmol/L (ref 98–111)
Creatinine, Ser: 0.55 mg/dL (ref 0.44–1.00)
GFR, Estimated: >60 mL/min (ref >60)
Glucose, Bld: 95 mg/dL (ref 70–99)
Potassium: 3.2 mmol/L — ABNORMAL LOW (ref 3.5–5.1)
Sodium: 131 mmol/L — ABNORMAL LOW (ref 135–145)

## 2021-07-24 LAB — BODY FLUID CELL COUNT WITH DIFFERENTIAL
Eos, Fluid: 2 %
Lymphs, Fluid: 13 %
Monocyte-Macrophage-Serous Fluid: 41 % — ABNORMAL LOW (ref 50–90)
Neutrophil Count, Fluid: 44 % — ABNORMAL HIGH (ref 0–25)
Total Nucleated Cell Count, Fluid: 3687 cu mm — ABNORMAL HIGH (ref 0–1000)

## 2021-07-24 LAB — CBC
HCT: 23.5 % — ABNORMAL LOW (ref 36.0–46.0)
Hemoglobin: 7.5 g/dL — ABNORMAL LOW (ref 12.0–15.0)
MCH: 29.6 pg (ref 26.0–34.0)
MCHC: 31.9 g/dL (ref 30.0–36.0)
MCV: 92.9 fL (ref 80.0–100.0)
Platelets: 198 10*3/uL (ref 150–400)
RBC: 2.53 MIL/uL — ABNORMAL LOW (ref 3.87–5.11)
RDW: 16.9 % — ABNORMAL HIGH (ref 11.5–15.5)
WBC: 25.7 10*3/uL — ABNORMAL HIGH (ref 4.0–10.5)
nRBC: 0.9 % — ABNORMAL HIGH (ref 0.0–0.2)

## 2021-07-24 LAB — PROTEIN, PLEURAL OR PERITONEAL FLUID: Total protein, fluid: 3.1 g/dL

## 2021-07-24 LAB — URINE CULTURE: Culture: NO GROWTH

## 2021-07-24 LAB — PHOSPHORUS: Phosphorus: 1.8 mg/dL — ABNORMAL LOW (ref 2.5–4.6)

## 2021-07-24 LAB — LACTATE DEHYDROGENASE, PLEURAL OR PERITONEAL FLUID: LD, Fluid: 107 U/L — ABNORMAL HIGH (ref 3–23)

## 2021-07-24 LAB — ALBUMIN, PLEURAL OR PERITONEAL FLUID: Albumin, Fluid: <1.5 g/dL

## 2021-07-24 LAB — ALBUMIN: Albumin: 2.9 g/dL — ABNORMAL LOW (ref 3.5–5.0)

## 2021-07-24 LAB — LACTATE DEHYDROGENASE: LDH: 243 U/L — ABNORMAL HIGH (ref 98–192)

## 2021-07-24 MED ORDER — POTASSIUM CHLORIDE CRYS ER 20 MEQ PO TBCR
40.0000 meq | EXTENDED_RELEASE_TABLET | Freq: Once | ORAL | Status: AC
  Administered 2021-07-24: 40 meq via ORAL
  Filled 2021-07-24: qty 2

## 2021-07-24 MED ORDER — LIDOCAINE HCL 1 % IJ SOLN
INTRAMUSCULAR | Status: AC
  Administered 2021-07-24: 10 mL
  Filled 2021-07-24: qty 20

## 2021-07-24 MED ORDER — ABEMACICLIB 50 MG PO TABS
50.0000 mg | ORAL_TABLET | Freq: Two times a day (BID) | ORAL | 1 refills | Status: DC
  Filled 2021-07-24: qty 70, 35d supply, fill #0

## 2021-07-24 MED ORDER — MELATONIN 3 MG PO TABS: 3.0000 mg | ORAL_TABLET | Freq: Once | ORAL | Status: DC

## 2021-07-24 NOTE — Progress Notes (Signed)
Abemaciclib prescription sent to [facility]

## 2021-07-24 NOTE — Progress Notes (Signed)
°  Transition of Care [facility]) Screening Note   Patient Details  Name: Christine Francis Date of Birth: January 08, 1961   Transition of Care [facility]) CM/SW Contact:    Lennart Pall, LCSW Phone Number: 07/24/2021, 8:36 AM    Transition of Care Department [facility]) has reviewed patient and no TOC needs have been identified at this time. We will continue to monitor patient advancement through interdisciplinary progression rounds. If new patient transition needs arise, please place a TOC consult.

## 2021-07-24 NOTE — Progress Notes (Signed)
Chaplain engaged in an initial visit with Christine Francis and her sister, Christine Francis.  Chaplain offered listening as [name] shared about her healthcare journey and most recent test results.  She shared about having breast cancer during the beginning of COVID and the toll that took on her but the hope she held in getting back to some sense of normalcy.  [name] now has been having trouble waiting on what comes next in the new discoveries with her health.  She is ready to put a name and treatment plan to her health and move forward.    Chaplain provided space for [name] to share her grief around her health and affirmed her tears.  [name] was also able to talk about her support system and previous things she has overcome.  Chaplain offered prayer, presence, and support.    07/24/21 1000  Clinical Encounter Type  Visited With Patient and family together  Visit Type Initial;Spiritual support  Spiritual Encounters  Spiritual Needs Prayer  Stress Factors  Patient Stress Factors Health changes;Exhausted;Loss of control;Major life changes

## 2021-07-24 NOTE — Progress Notes (Signed)
PROGRESS NOTE    Christine Francis  KCL:[id number] DOB: 17-Sep-1960 DOA: 07/22/2021 PCP: Molli Posey, MD   Brief Narrative:  61 years old female with PMH significant for left breast cancer s/p bilateral mastectomy on letrozole therapy, anxiety disorder, hypercalcemia, essential hypertension was sent from cancer center with multiple complaints most notably abdominal pain. Patient was found to have liver lesions and she underwent liver biopsy 4 days ago by IR.  Patient has developed pain around the biopsy area; after following up with oncologist and she was found to have leukocytosis , abdominal distention and confusion.  Admitted for abdominal pain mental status changes and SIRS without clear infectious source.  Pseudoaneurysm on CT, IR consulted and performed coil embolization during hospital stay.   Assessment & Plan:    Newly confirmed metastatic breast cancer Per Oncology -with mets to the liver Rule out carcinomatosis given ascites awaiting paracentesis  SIRS; without source, POA Sepsis ruled out Patient presented with tachycardia, tachypnea,hypotension, lactic acidosis, leukocytosis which may be reactive in the setting of metastatic disease, and ascites. -Discontinue antibiotics, follow cultures/symptoms -Paracentesis ordered given worsening distention   Acute metabolic encephalopathy, multifactorial  Hyperammonemia: -Improving with supportive care and lactulose, more awake alert oriented per family at bedside today -Continue lactulose, goal 3 bowel movement per day   Pseudoaneurysm: Patient was evaluated by IR recommended H&H every 6 hours. He underwent coil embolization of the bleeding artery. CBC Latest Ref Rng & Units 07/24/2021 07/23/2021 07/23/2021  WBC 4.0 - 10.5 K/uL 25.7(H) 26.6(H) 24.6(H)  Hemoglobin 12.0 - 15.0 g/dL 7.5(L) 8.1(L) 8.4(L)  Hematocrit 36.0 - 46.0 % 23.5(L) 25.9(L) 26.1(L)  Platelets 150 - 400 K/uL 198 181 173   Hypercalcemia: Patient is following  with oncology and getting Zometa for hypercalcemia.   Serum calcium 11.9.>10.9  Continue IV hydration. Continue to monitor serum calcium.   Essential hypertension: Resume blood pressure medications once blood pressure improves.   GERD: Continue pantoprazole.  DVT prophylaxis: SCDs only clear Code Status: Full Family Communication: At bedside  Status is: Inpatient  Dispo: The patient is from: Home              Anticipated d/c is to: Home              Anticipated d/c date is: 72+ hours              Patient currently not medically stable for discharge  Consultants:  Interventional radiology, oncology not  Procedures:  Coil embolization Paracentesis  Antimicrobials:  Discontinued  Subjective: No acute issues or events overnight, requesting advancement in diet which is certainly reasonable.  Admits to ongoing abdominal distention and worsening abdominal pain but otherwise denies nausea vomiting diarrhea constipation headache fevers chills or chest pain  Objective: Vitals:   07/24/21 0005 07/24/21 0200 07/24/21 0300 07/24/21 0400  BP: (!) 104/58 110/67  110/64  Pulse: 88 81  87  Resp: 19 17  18   Temp:   98 F (36.7 C)   TempSrc:   Oral   SpO2: 96% 97%  95%  Weight:      Height:        Intake/Output Summary (Last 24 hours) at 07/24/2021 0710 Last data filed at 07/23/2021 2122 Gross per 24 hour  Intake 359.56 ml  Output 300 ml  Net 59.56 ml   Filed Weights   07/22/21 1431  Weight: 85.7 kg    Examination:  General:  Pleasantly resting in bed, No acute distress. HEENT:  Normocephalic atraumatic.  Sclerae  nonicteric, noninjected.  Extraocular movements intact bilaterally. Neck:  Without mass or deformity.  Trachea is midline. Lungs:  Clear to auscultate bilaterally without rhonchi, wheeze, or rales. Heart:  Regular, rate and rhythm.  Without murmurs, rubs, or gallops. Abdomen: Taut, distended, nontympanic, with tenderness to palpation Extremities: Without  cyanosis, clubbing, edema, or obvious deformity. Vascular:  Dorsalis pedis and posterior tibial pulses palpable bilaterally. Skin:  Warm and dry, no erythema, no ulcerations.   Data Reviewed: I have personally reviewed following labs and imaging studies  CBC: Recent Labs  Lab 07/18/21 0917 07/19/21 1051 07/22/21 1117 07/22/21 1519 07/23/21 0256 07/23/21 0904 07/24/21 0239  WBC 17.9*   < > 20.0* 21.5* 24.6* 26.6* 25.7*  NEUTROABS 11.6*  --  13.5* 14.6*  --   --   --   HGB 13.6   < > 10.5* 10.2* 8.4* 8.1* 7.5*  HCT 41.3   < > 30.8* 30.3* 26.1* 25.9* 23.5*  MCV 88.4   < > 87.3 89.9 92.6 97.4 92.9  PLT 196   < > 171 173 173 181 198   < > = values in this interval not displayed.   Basic Metabolic Panel: Recent Labs  Lab 07/18/21 0917 07/22/21 1117 07/23/21 0256 07/24/21 0239  NA 134* 130* 129* 131*  K 3.2* 3.4* 3.6 3.2*  CL 102 98 99 102  CO2 24 22 19* 22  GLUCOSE 117* 120* 139* 95  BUN 15 19 25* 27*  CREATININE 0.79 0.79 0.91 0.55  CALCIUM 12.9* 11.9* 10.9* 10.5*  MG  --   --  2.0 2.1  PHOS  --   --  2.2* 1.8*   GFR: Estimated Creatinine Clearance: 80.5 mL/min (by C-G formula based on SCr of 0.55 mg/dL). Liver Function Tests: Recent Labs  Lab 07/18/21 0917 07/22/21 1117 07/23/21 0256  AST 100* 120* 126*  ALT 49* 54* 56*  ALKPHOS 142* 110 91  BILITOT 2.6* 3.3* 3.4*  PROT 8.1 7.5 6.9  ALBUMIN 3.4* 3.2* 2.7*   No results for input(s): LIPASE, AMYLASE in the last 168 hours. Recent Labs  Lab 07/22/21 1447 07/23/21 1218  AMMONIA 76* 45*   Coagulation Profile: Recent Labs  Lab 07/19/21 1051 07/23/21 0256  INR 1.2 1.3*   Cardiac Enzymes: No results for input(s): CKTOTAL, CKMB, CKMBINDEX, TROPONINI in the last 168 hours. BNP (last 3 results) No results for input(s): PROBNP in the last 8760 hours. HbA1C: No results for input(s): HGBA1C in the last 72 hours. CBG: No results for input(s): GLUCAP in the last 168 hours. Lipid Profile: No results for  input(s): CHOL, HDL, LDLCALC, TRIG, CHOLHDL, LDLDIRECT in the last 72 hours. Thyroid Function Tests: No results for input(s): TSH, T4TOTAL, FREET4, T3FREE, THYROIDAB in the last 72 hours. Anemia Panel: No results for input(s): VITAMINB12, FOLATE, FERRITIN, TIBC, IRON, RETICCTPCT in the last 72 hours. Sepsis Labs: Recent Labs  Lab 07/22/21 1647 07/22/21 1803 07/23/21 0256 07/23/21 0904  PROCALCITON  --   --  0.23  --   LATICACIDVEN 4.7* 5.6*  --  4.1*    Recent Results (from the past 240 hour(s))  Resp Panel by RT-PCR (Flu A&B, Covid) Nasopharyngeal Swab     Status: None   Collection Time: 07/22/21  5:04 PM   Specimen: Nasopharyngeal Swab; Nasopharyngeal(NP) swabs in vial transport medium  Result Value Ref Range Status   SARS Coronavirus 2 by RT PCR NEGATIVE NEGATIVE Final    Comment: (NOTE) SARS-CoV-2 target nucleic acids are NOT DETECTED.  The SARS-CoV-2 RNA  is generally detectable in upper respiratory specimens during the acute phase of infection. The lowest concentration of SARS-CoV-2 viral copies this assay can detect is 138 copies/mL. A negative result does not preclude SARS-Cov-2 infection and should not be used as the sole basis for treatment or other patient management decisions. A negative result may occur with  improper specimen collection/handling, submission of specimen other than nasopharyngeal swab, presence of viral mutation(s) within the areas targeted by this assay, and inadequate number of viral copies(<138 copies/mL). A negative result must be combined with clinical observations, patient history, and epidemiological information. The expected result is Negative.  Fact Sheet for Patients:  EntrepreneurPulse.com.au  Fact Sheet for Healthcare Providers:  IncredibleEmployment.be  This test is no t yet approved or cleared by the [place] FDA and  has been authorized for detection and/or diagnosis of SARS-CoV-2 by FDA  under an Emergency Use Authorization (EUA). This EUA will remain  in effect (meaning this test can be used) for the duration of the COVID-19 declaration under Section 564(b)(1) of the Act, 21 U.S.C.section 360bbb-3(b)(1), unless the authorization is terminated  or revoked sooner.       Influenza A by PCR NEGATIVE NEGATIVE Final   Influenza B by PCR NEGATIVE NEGATIVE Final    Comment: (NOTE) The Xpert Xpress SARS-CoV-2/FLU/RSV plus assay is intended as an aid in the diagnosis of influenza from Nasopharyngeal swab specimens and should not be used as a sole basis for treatment. Nasal washings and aspirates are unacceptable for Xpert Xpress SARS-CoV-2/FLU/RSV testing.  Fact Sheet for Patients: EntrepreneurPulse.com.au  Fact Sheet for Healthcare Providers: IncredibleEmployment.be  This test is not yet approved or cleared by the [place] FDA and has been authorized for detection and/or diagnosis of SARS-CoV-2 by FDA under an Emergency Use Authorization (EUA). This EUA will remain in effect (meaning this test can be used) for the duration of the COVID-19 declaration under Section 564(b)(1) of the Act, 21 U.S.C. section 360bbb-3(b)(1), unless the authorization is terminated or revoked.  Performed at [facility], [place] [address], [place], [place] [address]          Radiology Studies: DG Chest 2 View  Result Date: 07/22/2021 CLINICAL DATA:  Shortness of breath after liver biopsy. EXAM: CHEST - 2 VIEW COMPARISON:  None. FINDINGS: The heart size and mediastinal contours are within normal limits. Right lung is clear. Minimal left lingular subsegmental atelectasis or scarring is noted. The visualized skeletal structures are unremarkable. IMPRESSION: Minimal left lingular subsegmental atelectasis or scarring. Electronically Signed   By: Marijo Conception M.D.   On: 07/22/2021 15:12   CT Head Wo Contrast  Result Date:  07/22/2021 CLINICAL DATA:  Head trauma moderate to severe. Brain Mets suspected. EXAM: CT HEAD WITHOUT CONTRAST TECHNIQUE: Contiguous axial images were obtained from the base of the skull through the vertex without intravenous contrast. COMPARISON:  None. FINDINGS: Brain: No evidence of acute infarction, hemorrhage, hydrocephalus, extra-axial collection or mass lesion/mass effect. Vascular: No hyperdense vessel or unexpected calcification. Skull: Normal. Negative for fracture or focal lesion. Sinuses/Orbits: No acute finding. Other: None. IMPRESSION: No acute intracranial abnormality. No appreciable mass on this noncontrast enhanced examination. Electronically Signed   By: Keane Police D.O.   On: 07/22/2021 15:12   IR Angiogram Visceral Selective  Result Date: 07/23/2021 CLINICAL DATA:  Recent percutaneous liver biopsy, now with increasing abdominal ascites and pericapsular pseudoaneurysm. EXAM: SELECTIVE VISCERAL ARTERIOGRAPHY; IR ULTRASOUND GUIDANCE VASC ACCESS RIGHT; IR EMBO ART VEN HEMORR LYMPH EXTRAV INC  GUIDE ROADMAPPING; ADDITIONAL ARTERIOGRAPHY ANESTHESIA/SEDATION: Intravenous Fentanyl 149mcg and Versed 1.5mg  were administered as conscious sedation during continuous monitoring of the patient's level of consciousness and physiological / cardiorespiratory status by the radiology RN, with a total moderate sedation time of 86 minutes. MEDICATIONS: Lidocaine 1% subcutaneous CONTRAST:  24mL OMNIPAQUE IOHEXOL 300 MG/ML  SOLN PROCEDURE: The procedure, risks (including but not limited to bleeding, infection, organ damage ), benefits, and alternatives were explained to the patient. Questions regarding the procedure were encouraged and answered. The patient understands and consents to the procedure. Right femoral region prepped and draped in usual sterile fashion. Maximal barrier sterile technique was utilized including caps, mask, sterile gowns, sterile gloves, sterile drape, hand hygiene and skin antiseptic. The  right common femoral artery was localized under ultrasound. Under real-time ultrasound guidance, the vessel was accessed with a 21-gauge micropuncture needle, exchanged over a 018 guidewire for a transitional dilator, through which a 035 guidewire was advanced. Over this, a 5 [place] vascular sheath was placed, through which a 5 [place] C2 catheter was advanced and used to selectively catheterize the celiac axis for selective arteriography in multiple projections. The C2 was then utilized to selectively catheterize the common hepatic artery artery for selective arteriography in multiple projections. The C2 was then utilized to selectively catheterize the right hepatic artery for selective arteriography. Coaxial Renegade microcatheter was advanced with a 016 guidewire and used for selective catheterization of a peripheral right hepatic arterial branch supplying the pseudoaneurysm. Once the catheter was positioned as peripherally is possible, a single 2 mm X 2 cm interlock coil was deployed. After 4 minutes, confirmatory arteriogram was obtained showing no further opacification of the pseudoaneurysm. The microcatheter, catheter and sheath were removed and hemostasis achieved with the aid of the Exoseal device after confirmatory femoral arteriography. The patient tolerated the procedure well. COMPLICATIONS: None immediate FINDINGS: Continued opacification of extracapsular pseudoaneurysm from the right hepatic lobe supplied by small right hepatic arterial branch. Technically successful coil embolization of a peripheral feeding branch, with no further opacification of the pseudoaneurysm. IMPRESSION: 1. Technically successful coil embolization of the feeding branch to right hepatic arterial pericapsular pseudoaneurysm. Electronically Signed   By: Lucrezia Europe M.D.   On: 07/23/2021 08:25   IR Angiogram Selective Each Additional Vessel  Result Date: 07/23/2021 CLINICAL DATA:  Recent percutaneous liver biopsy, now with  increasing abdominal ascites and pericapsular pseudoaneurysm. EXAM: SELECTIVE VISCERAL ARTERIOGRAPHY; IR ULTRASOUND GUIDANCE VASC ACCESS RIGHT; IR EMBO ART VEN HEMORR LYMPH EXTRAV INC GUIDE ROADMAPPING; ADDITIONAL ARTERIOGRAPHY ANESTHESIA/SEDATION: Intravenous Fentanyl 19mcg and Versed 1.5mg  were administered as conscious sedation during continuous monitoring of the patient's level of consciousness and physiological / cardiorespiratory status by the radiology RN, with a total moderate sedation time of 86 minutes. MEDICATIONS: Lidocaine 1% subcutaneous CONTRAST:  36mL OMNIPAQUE IOHEXOL 300 MG/ML  SOLN PROCEDURE: The procedure, risks (including but not limited to bleeding, infection, organ damage ), benefits, and alternatives were explained to the patient. Questions regarding the procedure were encouraged and answered. The patient understands and consents to the procedure. Right femoral region prepped and draped in usual sterile fashion. Maximal barrier sterile technique was utilized including caps, mask, sterile gowns, sterile gloves, sterile drape, hand hygiene and skin antiseptic. The right common femoral artery was localized under ultrasound. Under real-time ultrasound guidance, the vessel was accessed with a 21-gauge micropuncture needle, exchanged over a 018 guidewire for a transitional dilator, through which a 035 guidewire was advanced. Over this, a 5 [place] vascular sheath was placed, through which  a 5 [place] C2 catheter was advanced and used to selectively catheterize the celiac axis for selective arteriography in multiple projections. The C2 was then utilized to selectively catheterize the common hepatic artery artery for selective arteriography in multiple projections. The C2 was then utilized to selectively catheterize the right hepatic artery for selective arteriography. Coaxial Renegade microcatheter was advanced with a 016 guidewire and used for selective catheterization of a peripheral right hepatic  arterial branch supplying the pseudoaneurysm. Once the catheter was positioned as peripherally is possible, a single 2 mm X 2 cm interlock coil was deployed. After 4 minutes, confirmatory arteriogram was obtained showing no further opacification of the pseudoaneurysm. The microcatheter, catheter and sheath were removed and hemostasis achieved with the aid of the Exoseal device after confirmatory femoral arteriography. The patient tolerated the procedure well. COMPLICATIONS: None immediate FINDINGS: Continued opacification of extracapsular pseudoaneurysm from the right hepatic lobe supplied by small right hepatic arterial branch. Technically successful coil embolization of a peripheral feeding branch, with no further opacification of the pseudoaneurysm. IMPRESSION: 1. Technically successful coil embolization of the feeding branch to right hepatic arterial pericapsular pseudoaneurysm. Electronically Signed   By: Lucrezia Europe M.D.   On: 07/23/2021 08:25   IR Angiogram Selective Each Additional Vessel  Result Date: 07/23/2021 CLINICAL DATA:  Recent percutaneous liver biopsy, now with increasing abdominal ascites and pericapsular pseudoaneurysm. EXAM: SELECTIVE VISCERAL ARTERIOGRAPHY; IR ULTRASOUND GUIDANCE VASC ACCESS RIGHT; IR EMBO ART VEN HEMORR LYMPH EXTRAV INC GUIDE ROADMAPPING; ADDITIONAL ARTERIOGRAPHY ANESTHESIA/SEDATION: Intravenous Fentanyl 1100mcg and Versed 1.5mg  were administered as conscious sedation during continuous monitoring of the patient's level of consciousness and physiological / cardiorespiratory status by the radiology RN, with a total moderate sedation time of 86 minutes. MEDICATIONS: Lidocaine 1% subcutaneous CONTRAST:  68mL OMNIPAQUE IOHEXOL 300 MG/ML  SOLN PROCEDURE: The procedure, risks (including but not limited to bleeding, infection, organ damage ), benefits, and alternatives were explained to the patient. Questions regarding the procedure were encouraged and answered. The patient  understands and consents to the procedure. Right femoral region prepped and draped in usual sterile fashion. Maximal barrier sterile technique was utilized including caps, mask, sterile gowns, sterile gloves, sterile drape, hand hygiene and skin antiseptic. The right common femoral artery was localized under ultrasound. Under real-time ultrasound guidance, the vessel was accessed with a 21-gauge micropuncture needle, exchanged over a 018 guidewire for a transitional dilator, through which a 035 guidewire was advanced. Over this, a 5 [place] vascular sheath was placed, through which a 5 [place] C2 catheter was advanced and used to selectively catheterize the celiac axis for selective arteriography in multiple projections. The C2 was then utilized to selectively catheterize the common hepatic artery artery for selective arteriography in multiple projections. The C2 was then utilized to selectively catheterize the right hepatic artery for selective arteriography. Coaxial Renegade microcatheter was advanced with a 016 guidewire and used for selective catheterization of a peripheral right hepatic arterial branch supplying the pseudoaneurysm. Once the catheter was positioned as peripherally is possible, a single 2 mm X 2 cm interlock coil was deployed. After 4 minutes, confirmatory arteriogram was obtained showing no further opacification of the pseudoaneurysm. The microcatheter, catheter and sheath were removed and hemostasis achieved with the aid of the Exoseal device after confirmatory femoral arteriography. The patient tolerated the procedure well. COMPLICATIONS: None immediate FINDINGS: Continued opacification of extracapsular pseudoaneurysm from the right hepatic lobe supplied by small right hepatic arterial branch. Technically successful coil embolization of a peripheral feeding branch, with no further  opacification of the pseudoaneurysm. IMPRESSION: 1. Technically successful coil embolization of the feeding branch  to right hepatic arterial pericapsular pseudoaneurysm. Electronically Signed   By: Lucrezia Europe M.D.   On: 07/23/2021 08:25   IR Angiogram Selective Each Additional Vessel  Result Date: 07/23/2021 CLINICAL DATA:  Recent percutaneous liver biopsy, now with increasing abdominal ascites and pericapsular pseudoaneurysm. EXAM: SELECTIVE VISCERAL ARTERIOGRAPHY; IR ULTRASOUND GUIDANCE VASC ACCESS RIGHT; IR EMBO ART VEN HEMORR LYMPH EXTRAV INC GUIDE ROADMAPPING; ADDITIONAL ARTERIOGRAPHY ANESTHESIA/SEDATION: Intravenous Fentanyl 136mcg and Versed 1.5mg  were administered as conscious sedation during continuous monitoring of the patient's level of consciousness and physiological / cardiorespiratory status by the radiology RN, with a total moderate sedation time of 86 minutes. MEDICATIONS: Lidocaine 1% subcutaneous CONTRAST:  67mL OMNIPAQUE IOHEXOL 300 MG/ML  SOLN PROCEDURE: The procedure, risks (including but not limited to bleeding, infection, organ damage ), benefits, and alternatives were explained to the patient. Questions regarding the procedure were encouraged and answered. The patient understands and consents to the procedure. Right femoral region prepped and draped in usual sterile fashion. Maximal barrier sterile technique was utilized including caps, mask, sterile gowns, sterile gloves, sterile drape, hand hygiene and skin antiseptic. The right common femoral artery was localized under ultrasound. Under real-time ultrasound guidance, the vessel was accessed with a 21-gauge micropuncture needle, exchanged over a 018 guidewire for a transitional dilator, through which a 035 guidewire was advanced. Over this, a 5 [place] vascular sheath was placed, through which a 5 [place] C2 catheter was advanced and used to selectively catheterize the celiac axis for selective arteriography in multiple projections. The C2 was then utilized to selectively catheterize the common hepatic artery artery for selective arteriography in  multiple projections. The C2 was then utilized to selectively catheterize the right hepatic artery for selective arteriography. Coaxial Renegade microcatheter was advanced with a 016 guidewire and used for selective catheterization of a peripheral right hepatic arterial branch supplying the pseudoaneurysm. Once the catheter was positioned as peripherally is possible, a single 2 mm X 2 cm interlock coil was deployed. After 4 minutes, confirmatory arteriogram was obtained showing no further opacification of the pseudoaneurysm. The microcatheter, catheter and sheath were removed and hemostasis achieved with the aid of the Exoseal device after confirmatory femoral arteriography. The patient tolerated the procedure well. COMPLICATIONS: None immediate FINDINGS: Continued opacification of extracapsular pseudoaneurysm from the right hepatic lobe supplied by small right hepatic arterial branch. Technically successful coil embolization of a peripheral feeding branch, with no further opacification of the pseudoaneurysm. IMPRESSION: 1. Technically successful coil embolization of the feeding branch to right hepatic arterial pericapsular pseudoaneurysm. Electronically Signed   By: Lucrezia Europe M.D.   On: 07/23/2021 08:25   IR US Guide Vasc Access Right  Result Date: 07/23/2021 CLINICAL DATA:  Recent percutaneous liver biopsy, now with increasing abdominal ascites and pericapsular pseudoaneurysm. EXAM: SELECTIVE VISCERAL ARTERIOGRAPHY; IR ULTRASOUND GUIDANCE VASC ACCESS RIGHT; IR EMBO ART VEN HEMORR LYMPH EXTRAV INC GUIDE ROADMAPPING; ADDITIONAL ARTERIOGRAPHY ANESTHESIA/SEDATION: Intravenous Fentanyl 130mcg and Versed 1.5mg  were administered as conscious sedation during continuous monitoring of the patient's level of consciousness and physiological / cardiorespiratory status by the radiology RN, with a total moderate sedation time of 86 minutes. MEDICATIONS: Lidocaine 1% subcutaneous CONTRAST:  77mL OMNIPAQUE IOHEXOL 300 MG/ML   SOLN PROCEDURE: The procedure, risks (including but not limited to bleeding, infection, organ damage ), benefits, and alternatives were explained to the patient. Questions regarding the procedure were encouraged and answered. The patient understands and consents to the  procedure. Right femoral region prepped and draped in usual sterile fashion. Maximal barrier sterile technique was utilized including caps, mask, sterile gowns, sterile gloves, sterile drape, hand hygiene and skin antiseptic. The right common femoral artery was localized under ultrasound. Under real-time ultrasound guidance, the vessel was accessed with a 21-gauge micropuncture needle, exchanged over a 018 guidewire for a transitional dilator, through which a 035 guidewire was advanced. Over this, a 5 [place] vascular sheath was placed, through which a 5 [place] C2 catheter was advanced and used to selectively catheterize the celiac axis for selective arteriography in multiple projections. The C2 was then utilized to selectively catheterize the common hepatic artery artery for selective arteriography in multiple projections. The C2 was then utilized to selectively catheterize the right hepatic artery for selective arteriography. Coaxial Renegade microcatheter was advanced with a 016 guidewire and used for selective catheterization of a peripheral right hepatic arterial branch supplying the pseudoaneurysm. Once the catheter was positioned as peripherally is possible, a single 2 mm X 2 cm interlock coil was deployed. After 4 minutes, confirmatory arteriogram was obtained showing no further opacification of the pseudoaneurysm. The microcatheter, catheter and sheath were removed and hemostasis achieved with the aid of the Exoseal device after confirmatory femoral arteriography. The patient tolerated the procedure well. COMPLICATIONS: None immediate FINDINGS: Continued opacification of extracapsular pseudoaneurysm from the right hepatic lobe supplied by  small right hepatic arterial branch. Technically successful coil embolization of a peripheral feeding branch, with no further opacification of the pseudoaneurysm. IMPRESSION: 1. Technically successful coil embolization of the feeding branch to right hepatic arterial pericapsular pseudoaneurysm. Electronically Signed   By: Lucrezia Europe M.D.   On: 07/23/2021 08:25   IR EMBO ART  VEN HEMORR LYMPH EXTRAV  INC GUIDE ROADMAPPING  Result Date: 07/23/2021 CLINICAL DATA:  Recent percutaneous liver biopsy, now with increasing abdominal ascites and pericapsular pseudoaneurysm. EXAM: SELECTIVE VISCERAL ARTERIOGRAPHY; IR ULTRASOUND GUIDANCE VASC ACCESS RIGHT; IR EMBO ART VEN HEMORR LYMPH EXTRAV INC GUIDE ROADMAPPING; ADDITIONAL ARTERIOGRAPHY ANESTHESIA/SEDATION: Intravenous Fentanyl 162mcg and Versed 1.5mg  were administered as conscious sedation during continuous monitoring of the patient's level of consciousness and physiological / cardiorespiratory status by the radiology RN, with a total moderate sedation time of 86 minutes. MEDICATIONS: Lidocaine 1% subcutaneous CONTRAST:  8mL OMNIPAQUE IOHEXOL 300 MG/ML  SOLN PROCEDURE: The procedure, risks (including but not limited to bleeding, infection, organ damage ), benefits, and alternatives were explained to the patient. Questions regarding the procedure were encouraged and answered. The patient understands and consents to the procedure. Right femoral region prepped and draped in usual sterile fashion. Maximal barrier sterile technique was utilized including caps, mask, sterile gowns, sterile gloves, sterile drape, hand hygiene and skin antiseptic. The right common femoral artery was localized under ultrasound. Under real-time ultrasound guidance, the vessel was accessed with a 21-gauge micropuncture needle, exchanged over a 018 guidewire for a transitional dilator, through which a 035 guidewire was advanced. Over this, a 5 [place] vascular sheath was placed, through which a 5  [place] C2 catheter was advanced and used to selectively catheterize the celiac axis for selective arteriography in multiple projections. The C2 was then utilized to selectively catheterize the common hepatic artery artery for selective arteriography in multiple projections. The C2 was then utilized to selectively catheterize the right hepatic artery for selective arteriography. Coaxial Renegade microcatheter was advanced with a 016 guidewire and used for selective catheterization of a peripheral right hepatic arterial branch supplying the pseudoaneurysm. Once the catheter was positioned as peripherally is  possible, a single 2 mm X 2 cm interlock coil was deployed. After 4 minutes, confirmatory arteriogram was obtained showing no further opacification of the pseudoaneurysm. The microcatheter, catheter and sheath were removed and hemostasis achieved with the aid of the Exoseal device after confirmatory femoral arteriography. The patient tolerated the procedure well. COMPLICATIONS: None immediate FINDINGS: Continued opacification of extracapsular pseudoaneurysm from the right hepatic lobe supplied by small right hepatic arterial branch. Technically successful coil embolization of a peripheral feeding branch, with no further opacification of the pseudoaneurysm. IMPRESSION: 1. Technically successful coil embolization of the feeding branch to right hepatic arterial pericapsular pseudoaneurysm. Electronically Signed   By: Lucrezia Europe M.D.   On: 07/23/2021 08:25   CT Angio Abd/Pel W and/or Wo Contrast  Result Date: 07/22/2021 CLINICAL DATA:  Recent liver biopsy. Free fluid on bedside ultrasound. Evaluate for liver bleed. EXAM: CT ANGIOGRAPHY ABDOMEN AND PELVIS WITH CONTRAST AND WITHOUT CONTRAST TECHNIQUE: Multidetector CT imaging of the abdomen and pelvis was performed using the standard protocol during bolus administration of intravenous contrast. Multiplanar reconstructed images and MIPs were obtained and reviewed to  evaluate the vascular anatomy. CONTRAST:  157mL OMNIPAQUE IOHEXOL 350 MG/ML SOLN COMPARISON:  06/12/2021 FINDINGS: VASCULAR Aorta: Atherosclerotic calcifications in the abdominal aorta without aneurysm, dissection or significant stenosis. Celiac: Patent without evidence of aneurysm, dissection, vasculitis or significant stenosis. Variant anatomy with an accessory left hepatic artery coming off the left gastric artery. There is focal contrast extravasation just lateral to the right hepatic lobe on the arterial phase imaging on sequence 6, image 71. This is well seen on the coronal images, sequence 7, image 73. This is a relatively well-defined elongated structure on the arterial coronal arterial images, measuring up to 1.6 cm. This area does not significantly change in size on the delayed images and there is no clear evidence for contrast pooling outside of this isolated elongated structure. Finding is unusual but could represent apseudoaneurysm near the liver capsule. SMA: Patent without evidence of aneurysm, dissection, vasculitis or significant stenosis. Renals: Both renal arteries are patent without evidence of aneurysm, dissection, vasculitis, fibromuscular dysplasia or significant stenosis. IMA: Patent without evidence of aneurysm, dissection, vasculitis or significant stenosis. Inflow: Patent without evidence of aneurysm, dissection, vasculitis or significant stenosis. Proximal Outflow: Proximal femoral arteries are patent bilaterally. Veins: IVC and renal veins are patent. Main portal venous system appears to be patent but limited evaluation of intrahepatic portal veins. There is enlarged and umbilical vein along the anterior abdomen. Iliac veins are patent. Review of the MIP images confirms the above findings. NON-VASCULAR Lower chest: Bilateral breast implants. No pleural effusions. Patchy densities at lung bases likely represent atelectasis. Hepatobiliary: Liver is heterogeneous. No large discrete liver  lesions. Normal appearance of the gallbladder. No significant biliary dilatation. Pancreas: Unremarkable. No pancreatic ductal dilatation or surrounding inflammatory changes. Spleen: Normal in size without focal abnormality. Adrenals/Urinary Tract: Normal appearance of the adrenal glands. Normal appearance of the right kidney. Left renal cysts. Negative for hydronephrosis. Small amount of fluid in the urinary bladder. Stomach/Bowel: Normal appearance of the stomach. There is no evidence for bowel distention or focal bowel inflammation. Lymphatic: No lymph node enlargement in the abdomen or pelvis. Reproductive: Uterus and bilateral adnexa are unremarkable. Other: Moderate amount of ascites in the abdomen and pelvis. Hounsfield units of the perihepatic ascites is relatively low density measuring between 11-13. Fluid in the cul-de-sac measures approximately 24. Most dense ascites is in the right lower quadrant measuring roughly 26 Hounsfield units. The ascites  has increased from the PET-CT on 07/05/2021. Negative for free air. Musculoskeletal: No acute bone abnormality. IMPRESSION: VASCULAR 1. Focal area of contrast extravasation just lateral to the liver capsule in the right hepatic lobe. This location corresponds with the recent biopsy site. This area of contrast extravasation may be contained because it does not significantly enlarge on the delayed images. This could represent an elongated pseudoaneurysm along the capsule measuring roughly 1.6 cm. Dedicated ultrasound of this area with duplex imaging may better evaluate this area. 2.  Aortic Atherosclerosis (ICD10-I70.0). 3. Persistent enlarged umbilical vein. Finding raises concern for underlying portal hypertension. NON-VASCULAR 1. Increased free fluid in the abdomen and pelvis compared to 07/05/2021. Hounsfield units are slightly dense but more dense than the previous PET-CT. Suspect this fluid represents a combination of blood and underlying ascites. 2. Again  noted is a heterogeneous liver without a discrete lesion. These results were called by telephone at the time of interpretation on 07/22/2021 at 4:28 pm to provider [facility] , who verbally acknowledged these results. Electronically Signed   By: Markus Daft M.D.   On: 07/22/2021 16:51    Scheduled Meds:  atenolol  50 mg Oral Daily   And   hydrochlorothiazide  25 mg Oral Daily   Chlorhexidine Gluconate Cloth  6 each Topical Daily   docusate sodium  100 mg Oral BID   lactulose  20 g Oral TID   mouth rinse  15 mL Mouth Rinse BID   montelukast  10 mg Oral QHS   omeprazole  20 mg Oral Daily   Continuous Infusions:  sodium chloride Stopped (07/22/21 1758)   cefTRIAXone (ROCEPHIN)  IV Stopped (07/23/21 1809)     LOS: 2 days   Time spent: 62min  Zully Frane C Negar Sieler, DO [facility]  If 7PM-7AM, please contact night-coverage [email or web address]  07/24/2021, 7:10 AM

## 2021-07-24 NOTE — Progress Notes (Signed)
HEMATOLOGY-ONCOLOGY PROGRESS NOTE  SUBJECTIVE: Patient underwent paracentesis and over 4 L was removed.  She is feeling markedly better.  She is significantly less confused but she is still slightly drowsy. Her sister was also in the room and we discussed the treatment plan.  Oncology History  Malignant neoplasm of upper-outer quadrant of left breast in female, estrogen receptor positive ([place])  07/19/2014 Initial Biopsy   Rt.Breast Biopsy: LCIS   11/29/2014 Surgery   Right lumpectomy: LCIS with fibrocystic changes with usual ductal hyperplasia,PASH, 0.8 cm margins for LCIS   02/06/2015 -  Anti-estrogen oral therapy   tamoxifen 20 mg daily   12/08/2018 Relapse/Recurrence   Left breast 2 cm mass at 7 o clock position: ILC grade 2, ER 90%, PR 30%, Her 2 neg, Ki 67: 15% T1CN0 stage 1A   02/07/2019 Surgery   Bilateral mastectomies with reconstruction ([place]) Right breast: atypical lobular hyperplasia and no evidence of carcinoma in 1 lymph node. Left breast: invasive lobular carcinoma with LCIS, grade 2, 2.1cm, 2 lymph nodes negative for carcinoma, and invasive carcinoma broadly present at the anterior margin.    02/07/2019 Cancer Staging   Staging form: Breast, AJCC 8th Edition - Pathologic stage from 02/07/2019: Stage IA (pT2, pN0, cM0, G2, ER+, PR+, HER2-, Oncotype DX score: 24) - Signed by Gardenia Phlegm, NP on 03/02/2019    02/07/2019 Oncotype testing   24/10%; no benefit from chemo   02/2019 -  Anti-estrogen oral therapy   Anastrozole daily switched to letrozole 09/26/2019     OBJECTIVE: REVIEW OF SYSTEMS:   Drowsiness.  Patient is not confused.  She is oriented  PHYSICAL EXAMINATION: ECOG PERFORMANCE STATUS: 3 - Symptomatic, >50% confined to bed  Vitals:   07/24/21 1428 07/24/21 1449  BP: (!) 144/66 108/61  Pulse:    Resp:    Temp:    SpO2:     Filed Weights   07/22/21 1431  Weight: 189 lb (85.7 kg)    LABORATORY DATA:  I have reviewed the data as  listed CMP Latest Ref Rng & Units 07/24/2021 07/23/2021 07/22/2021  Glucose 70 - 99 mg/dL 95 139(H) 120(H)  BUN 8 - 23 mg/dL 27(H) 25(H) 19  Creatinine 0.44 - 1.00 mg/dL 0.55 0.91 0.79  Sodium 135 - 145 mmol/L 131(L) 129(L) 130(L)  Potassium 3.5 - 5.1 mmol/L 3.2(L) 3.6 3.4(L)  Chloride 98 - 111 mmol/L 102 99 98  CO2 22 - 32 mmol/L 22 19(L) 22  Calcium 8.9 - 10.3 mg/dL 10.5(H) 10.9(H) 11.9(H)  Total Protein 6.5 - 8.1 g/dL - 6.9 7.5  Total Bilirubin 0.3 - 1.2 mg/dL - 3.4(H) 3.3(H)  Alkaline Phos 38 - 126 U/L - 91 110  AST 15 - 41 U/L - 126(H) 120(H)  ALT 0 - 44 U/L - 56(H) 54(H)    Lab Results  Component Value Date   WBC 25.7 (H) 07/24/2021   HGB 7.5 (L) 07/24/2021   HCT 23.5 (L) 07/24/2021   MCV 92.9 07/24/2021   PLT 198 07/24/2021   NEUTROABS 14.6 (H) 07/22/2021    ASSESSMENT AND PLAN: 1.  Metastatic breast cancer with liver metastases ER 95%, PR 0%, Ki-67 15%, HER2 equivocal by IHC 2+ FISH is pending I suspect that the HER2 will be negative based on previous negative results. Recommendation: Discontinue letrozole Initiate therapy with Faslodex and abemaciclib (as outpatient) I discussed with the patient that the response to treatment takes at least 2 to 3 months. In the interim patient might need supportive care measures  like paracentesis, measures to decrease ammonia like lactulose and Zometa for hypercalcemia. If the cancer response to the treatment then we should see an improvement in all of these parameters.  2. if the patient does not respond to Faslodex and Verzenio then we have to consider systemic chemotherapy options.  All of this would depend on her performance status at that time.  3.  I discussed with her that her performance status dictates treatment and we need her to be mobile and ambulatory to be able to justify systemic therapies.  She is very motivated to work hard and get stronger and eat better.  We will arrange an outpatient appointment for her and get her  started on the treatment.

## 2021-07-24 NOTE — Progress Notes (Signed)
Referring Physician(s): [place]  Supervising Physician: Dr. Earleen Newport  Patient Status:  Inpt  Chief Complaint: Abdominal pain, ascites, bleed post liver biopsy   Subjective: Pt still feeling tired, cont to have abd distension/discomfort; denies fever/chills,N/V, resp issues; did have loose dark BM- on lactulose   Allergies: Ampicillin, Dilaudid [hydromorphone hcl], Erythromycin, Penicillins, Sulfa drugs cross reactors, and Declomycin [demeclocycline]  Medications:  Current Facility-Administered Medications:    acetaminophen (TYLENOL) tablet 650 mg, 650 mg, Oral, Q6H PRN **OR** acetaminophen (TYLENOL) suppository 650 mg, 650 mg, Rectal, Q6H PRN, Shawna Clamp, MD   albuterol (PROVENTIL) (2.5 MG/3ML) 0.083% nebulizer solution 2.5 mg, 2.5 mg, Inhalation, Q6H PRN, Shawna Clamp, MD   ALPRAZolam Duanne Moron) tablet 0.5 mg, 0.5 mg, Oral, Daily PRN, Shawna Clamp, MD, 0.5 mg at 07/23/21 2128   atenolol (TENORMIN) tablet 50 mg, 50 mg, Oral, Daily, 50 mg at 07/24/21 1133 **AND** hydrochlorothiazide (HYDRODIURIL) tablet 25 mg, 25 mg, Oral, Daily, Shawna Clamp, MD, 25 mg at 07/24/21 1133   Chlorhexidine Gluconate Cloth 2 % PADS 6 each, 6 each, Topical, Daily, Shawna Clamp, MD, 6 each at 07/24/21 0326   docusate sodium (COLACE) capsule 100 mg, 100 mg, Oral, BID, Shawna Clamp, MD   fluticasone furoate-vilanterol (BREO ELLIPTA) 200-25 MCG/ACT 1 puff, 1 puff, Inhalation, Daily PRN, Shawna Clamp, MD   lactulose (CHRONULAC) 10 GM/15ML solution 20 g, 20 g, Oral, TID, Shawna Clamp, MD, 20 g at 07/24/21 1137   lip balm (CARMEX) ointment, , Topical, PRN, Shawna Clamp, MD, Given at 07/23/21 1149   MEDLINE mouth rinse, 15 mL, Mouth Rinse, BID, Shawna Clamp, MD, 15 mL at 07/24/21 1136   montelukast (SINGULAIR) tablet 10 mg, 10 mg, Oral, QHS, Shawna Clamp, MD, 10 mg at 07/23/21 2128   omeprazole (PRILOSEC) capsule 20 mg, 20 mg, Oral, Daily, Shawna Clamp, MD, 20 mg at 07/24/21 1133    ondansetron (ZOFRAN) tablet 4 mg, 4 mg, Oral, Q6H PRN **OR** ondansetron (ZOFRAN) injection 4 mg, 4 mg, Intravenous, Q6H PRN, Shawna Clamp, MD, 4 mg at 07/22/21 2349    Vital Signs: BP 108/61    Pulse 98    Temp 98.2 F (36.8 C) (Oral)    Resp 17    Ht 5\' 6"  (1.676 m)    Wt 85.7 kg    SpO2 96%    BMI 30.51 kg/m   Physical Exam Constitutional:      General: She is not in acute distress.    Appearance: She is not ill-appearing.  Cardiovascular:     Rate and Rhythm: Normal rate and regular rhythm.     Heart sounds: Normal heart sounds.  Pulmonary:     Effort: Pulmonary effort is normal. No respiratory distress.     Breath sounds: Normal breath sounds.  Abdominal:     General: There is distension.     Palpations: Abdomen is soft.     Tenderness: There is no abdominal tenderness. There is no guarding.  Neurological:     General: No focal deficit present.     Mental Status: She is alert and oriented to person, place, and time.     Imaging: DG Chest 2 View  Result Date: 07/22/2021 CLINICAL DATA:  Shortness of breath after liver biopsy. EXAM: CHEST - 2 VIEW COMPARISON:  None. FINDINGS: The heart size and mediastinal contours are within normal limits. Right lung is clear. Minimal left lingular subsegmental atelectasis or scarring is noted. The visualized skeletal structures are unremarkable. IMPRESSION: Minimal left lingular subsegmental atelectasis or scarring. Electronically  Signed   By: Marijo Conception M.D.   On: 07/22/2021 15:12   CT Head Wo Contrast  Result Date: 07/22/2021 CLINICAL DATA:  Head trauma moderate to severe. Brain Mets suspected. EXAM: CT HEAD WITHOUT CONTRAST TECHNIQUE: Contiguous axial images were obtained from the base of the skull through the vertex without intravenous contrast. COMPARISON:  None. FINDINGS: Brain: No evidence of acute infarction, hemorrhage, hydrocephalus, extra-axial collection or mass lesion/mass effect. Vascular: No hyperdense vessel or unexpected  calcification. Skull: Normal. Negative for fracture or focal lesion. Sinuses/Orbits: No acute finding. Other: None. IMPRESSION: No acute intracranial abnormality. No appreciable mass on this noncontrast enhanced examination. Electronically Signed   By: Keane Police D.O.   On: 07/22/2021 15:12   IR Angiogram Visceral Selective  Result Date: 07/23/2021 CLINICAL DATA:  Recent percutaneous liver biopsy, now with increasing abdominal ascites and pericapsular pseudoaneurysm. EXAM: SELECTIVE VISCERAL ARTERIOGRAPHY; IR ULTRASOUND GUIDANCE VASC ACCESS RIGHT; IR EMBO ART VEN HEMORR LYMPH EXTRAV INC GUIDE ROADMAPPING; ADDITIONAL ARTERIOGRAPHY ANESTHESIA/SEDATION: Intravenous Fentanyl 145mcg and Versed 1.5mg  were administered as conscious sedation during continuous monitoring of the patient's level of consciousness and physiological / cardiorespiratory status by the radiology RN, with a total moderate sedation time of 86 minutes. MEDICATIONS: Lidocaine 1% subcutaneous CONTRAST:  71mL OMNIPAQUE IOHEXOL 300 MG/ML  SOLN PROCEDURE: The procedure, risks (including but not limited to bleeding, infection, organ damage ), benefits, and alternatives were explained to the patient. Questions regarding the procedure were encouraged and answered. The patient understands and consents to the procedure. Right femoral region prepped and draped in usual sterile fashion. Maximal barrier sterile technique was utilized including caps, mask, sterile gowns, sterile gloves, sterile drape, hand hygiene and skin antiseptic. The right common femoral artery was localized under ultrasound. Under real-time ultrasound guidance, the vessel was accessed with a 21-gauge micropuncture needle, exchanged over a 018 guidewire for a transitional dilator, through which a 035 guidewire was advanced. Over this, a 5 [place] vascular sheath was placed, through which a 5 [place] C2 catheter was advanced and used to selectively catheterize the celiac axis for selective  arteriography in multiple projections. The C2 was then utilized to selectively catheterize the common hepatic artery artery for selective arteriography in multiple projections. The C2 was then utilized to selectively catheterize the right hepatic artery for selective arteriography. Coaxial Renegade microcatheter was advanced with a 016 guidewire and used for selective catheterization of a peripheral right hepatic arterial branch supplying the pseudoaneurysm. Once the catheter was positioned as peripherally is possible, a single 2 mm X 2 cm interlock coil was deployed. After 4 minutes, confirmatory arteriogram was obtained showing no further opacification of the pseudoaneurysm. The microcatheter, catheter and sheath were removed and hemostasis achieved with the aid of the Exoseal device after confirmatory femoral arteriography. The patient tolerated the procedure well. COMPLICATIONS: None immediate FINDINGS: Continued opacification of extracapsular pseudoaneurysm from the right hepatic lobe supplied by small right hepatic arterial branch. Technically successful coil embolization of a peripheral feeding branch, with no further opacification of the pseudoaneurysm. IMPRESSION: 1. Technically successful coil embolization of the feeding branch to right hepatic arterial pericapsular pseudoaneurysm. Electronically Signed   By: Lucrezia Europe M.D.   On: 07/23/2021 08:25   IR Angiogram Selective Each Additional Vessel  Result Date: 07/23/2021 CLINICAL DATA:  Recent percutaneous liver biopsy, now with increasing abdominal ascites and pericapsular pseudoaneurysm. EXAM: SELECTIVE VISCERAL ARTERIOGRAPHY; IR ULTRASOUND GUIDANCE VASC ACCESS RIGHT; IR EMBO ART VEN HEMORR LYMPH EXTRAV INC GUIDE ROADMAPPING; ADDITIONAL ARTERIOGRAPHY ANESTHESIA/SEDATION: Intravenous  Fentanyl 137mcg and Versed 1.5mg  were administered as conscious sedation during continuous monitoring of the patient's level of consciousness and physiological /  cardiorespiratory status by the radiology RN, with a total moderate sedation time of 86 minutes. MEDICATIONS: Lidocaine 1% subcutaneous CONTRAST:  16mL OMNIPAQUE IOHEXOL 300 MG/ML  SOLN PROCEDURE: The procedure, risks (including but not limited to bleeding, infection, organ damage ), benefits, and alternatives were explained to the patient. Questions regarding the procedure were encouraged and answered. The patient understands and consents to the procedure. Right femoral region prepped and draped in usual sterile fashion. Maximal barrier sterile technique was utilized including caps, mask, sterile gowns, sterile gloves, sterile drape, hand hygiene and skin antiseptic. The right common femoral artery was localized under ultrasound. Under real-time ultrasound guidance, the vessel was accessed with a 21-gauge micropuncture needle, exchanged over a 018 guidewire for a transitional dilator, through which a 035 guidewire was advanced. Over this, a 5 [place] vascular sheath was placed, through which a 5 [place] C2 catheter was advanced and used to selectively catheterize the celiac axis for selective arteriography in multiple projections. The C2 was then utilized to selectively catheterize the common hepatic artery artery for selective arteriography in multiple projections. The C2 was then utilized to selectively catheterize the right hepatic artery for selective arteriography. Coaxial Renegade microcatheter was advanced with a 016 guidewire and used for selective catheterization of a peripheral right hepatic arterial branch supplying the pseudoaneurysm. Once the catheter was positioned as peripherally is possible, a single 2 mm X 2 cm interlock coil was deployed. After 4 minutes, confirmatory arteriogram was obtained showing no further opacification of the pseudoaneurysm. The microcatheter, catheter and sheath were removed and hemostasis achieved with the aid of the Exoseal device after confirmatory femoral arteriography.  The patient tolerated the procedure well. COMPLICATIONS: None immediate FINDINGS: Continued opacification of extracapsular pseudoaneurysm from the right hepatic lobe supplied by small right hepatic arterial branch. Technically successful coil embolization of a peripheral feeding branch, with no further opacification of the pseudoaneurysm. IMPRESSION: 1. Technically successful coil embolization of the feeding branch to right hepatic arterial pericapsular pseudoaneurysm. Electronically Signed   By: Lucrezia Europe M.D.   On: 07/23/2021 08:25   IR Angiogram Selective Each Additional Vessel  Result Date: 07/23/2021 CLINICAL DATA:  Recent percutaneous liver biopsy, now with increasing abdominal ascites and pericapsular pseudoaneurysm. EXAM: SELECTIVE VISCERAL ARTERIOGRAPHY; IR ULTRASOUND GUIDANCE VASC ACCESS RIGHT; IR EMBO ART VEN HEMORR LYMPH EXTRAV INC GUIDE ROADMAPPING; ADDITIONAL ARTERIOGRAPHY ANESTHESIA/SEDATION: Intravenous Fentanyl 189mcg and Versed 1.5mg  were administered as conscious sedation during continuous monitoring of the patient's level of consciousness and physiological / cardiorespiratory status by the radiology RN, with a total moderate sedation time of 86 minutes. MEDICATIONS: Lidocaine 1% subcutaneous CONTRAST:  70mL OMNIPAQUE IOHEXOL 300 MG/ML  SOLN PROCEDURE: The procedure, risks (including but not limited to bleeding, infection, organ damage ), benefits, and alternatives were explained to the patient. Questions regarding the procedure were encouraged and answered. The patient understands and consents to the procedure. Right femoral region prepped and draped in usual sterile fashion. Maximal barrier sterile technique was utilized including caps, mask, sterile gowns, sterile gloves, sterile drape, hand hygiene and skin antiseptic. The right common femoral artery was localized under ultrasound. Under real-time ultrasound guidance, the vessel was accessed with a 21-gauge micropuncture needle, exchanged  over a 018 guidewire for a transitional dilator, through which a 035 guidewire was advanced. Over this, a 5 [place] vascular sheath was placed, through which a 5 [place] C2 catheter  was advanced and used to selectively catheterize the celiac axis for selective arteriography in multiple projections. The C2 was then utilized to selectively catheterize the common hepatic artery artery for selective arteriography in multiple projections. The C2 was then utilized to selectively catheterize the right hepatic artery for selective arteriography. Coaxial Renegade microcatheter was advanced with a 016 guidewire and used for selective catheterization of a peripheral right hepatic arterial branch supplying the pseudoaneurysm. Once the catheter was positioned as peripherally is possible, a single 2 mm X 2 cm interlock coil was deployed. After 4 minutes, confirmatory arteriogram was obtained showing no further opacification of the pseudoaneurysm. The microcatheter, catheter and sheath were removed and hemostasis achieved with the aid of the Exoseal device after confirmatory femoral arteriography. The patient tolerated the procedure well. COMPLICATIONS: None immediate FINDINGS: Continued opacification of extracapsular pseudoaneurysm from the right hepatic lobe supplied by small right hepatic arterial branch. Technically successful coil embolization of a peripheral feeding branch, with no further opacification of the pseudoaneurysm. IMPRESSION: 1. Technically successful coil embolization of the feeding branch to right hepatic arterial pericapsular pseudoaneurysm. Electronically Signed   By: Lucrezia Europe M.D.   On: 07/23/2021 08:25   IR Angiogram Selective Each Additional Vessel  Result Date: 07/23/2021 CLINICAL DATA:  Recent percutaneous liver biopsy, now with increasing abdominal ascites and pericapsular pseudoaneurysm. EXAM: SELECTIVE VISCERAL ARTERIOGRAPHY; IR ULTRASOUND GUIDANCE VASC ACCESS RIGHT; IR EMBO ART VEN HEMORR LYMPH  EXTRAV INC GUIDE ROADMAPPING; ADDITIONAL ARTERIOGRAPHY ANESTHESIA/SEDATION: Intravenous Fentanyl 178mcg and Versed 1.5mg  were administered as conscious sedation during continuous monitoring of the patient's level of consciousness and physiological / cardiorespiratory status by the radiology RN, with a total moderate sedation time of 86 minutes. MEDICATIONS: Lidocaine 1% subcutaneous CONTRAST:  70mL OMNIPAQUE IOHEXOL 300 MG/ML  SOLN PROCEDURE: The procedure, risks (including but not limited to bleeding, infection, organ damage ), benefits, and alternatives were explained to the patient. Questions regarding the procedure were encouraged and answered. The patient understands and consents to the procedure. Right femoral region prepped and draped in usual sterile fashion. Maximal barrier sterile technique was utilized including caps, mask, sterile gowns, sterile gloves, sterile drape, hand hygiene and skin antiseptic. The right common femoral artery was localized under ultrasound. Under real-time ultrasound guidance, the vessel was accessed with a 21-gauge micropuncture needle, exchanged over a 018 guidewire for a transitional dilator, through which a 035 guidewire was advanced. Over this, a 5 [place] vascular sheath was placed, through which a 5 [place] C2 catheter was advanced and used to selectively catheterize the celiac axis for selective arteriography in multiple projections. The C2 was then utilized to selectively catheterize the common hepatic artery artery for selective arteriography in multiple projections. The C2 was then utilized to selectively catheterize the right hepatic artery for selective arteriography. Coaxial Renegade microcatheter was advanced with a 016 guidewire and used for selective catheterization of a peripheral right hepatic arterial branch supplying the pseudoaneurysm. Once the catheter was positioned as peripherally is possible, a single 2 mm X 2 cm interlock coil was deployed. After 4  minutes, confirmatory arteriogram was obtained showing no further opacification of the pseudoaneurysm. The microcatheter, catheter and sheath were removed and hemostasis achieved with the aid of the Exoseal device after confirmatory femoral arteriography. The patient tolerated the procedure well. COMPLICATIONS: None immediate FINDINGS: Continued opacification of extracapsular pseudoaneurysm from the right hepatic lobe supplied by small right hepatic arterial branch. Technically successful coil embolization of a peripheral feeding branch, with no further opacification of the pseudoaneurysm. IMPRESSION:  1. Technically successful coil embolization of the feeding branch to right hepatic arterial pericapsular pseudoaneurysm. Electronically Signed   By: Lucrezia Europe M.D.   On: 07/23/2021 08:25   IR US Guide Vasc Access Right  Result Date: 07/23/2021 CLINICAL DATA:  Recent percutaneous liver biopsy, now with increasing abdominal ascites and pericapsular pseudoaneurysm. EXAM: SELECTIVE VISCERAL ARTERIOGRAPHY; IR ULTRASOUND GUIDANCE VASC ACCESS RIGHT; IR EMBO ART VEN HEMORR LYMPH EXTRAV INC GUIDE ROADMAPPING; ADDITIONAL ARTERIOGRAPHY ANESTHESIA/SEDATION: Intravenous Fentanyl 184mcg and Versed 1.5mg  were administered as conscious sedation during continuous monitoring of the patient's level of consciousness and physiological / cardiorespiratory status by the radiology RN, with a total moderate sedation time of 86 minutes. MEDICATIONS: Lidocaine 1% subcutaneous CONTRAST:  58mL OMNIPAQUE IOHEXOL 300 MG/ML  SOLN PROCEDURE: The procedure, risks (including but not limited to bleeding, infection, organ damage ), benefits, and alternatives were explained to the patient. Questions regarding the procedure were encouraged and answered. The patient understands and consents to the procedure. Right femoral region prepped and draped in usual sterile fashion. Maximal barrier sterile technique was utilized including caps, mask, sterile  gowns, sterile gloves, sterile drape, hand hygiene and skin antiseptic. The right common femoral artery was localized under ultrasound. Under real-time ultrasound guidance, the vessel was accessed with a 21-gauge micropuncture needle, exchanged over a 018 guidewire for a transitional dilator, through which a 035 guidewire was advanced. Over this, a 5 [place] vascular sheath was placed, through which a 5 [place] C2 catheter was advanced and used to selectively catheterize the celiac axis for selective arteriography in multiple projections. The C2 was then utilized to selectively catheterize the common hepatic artery artery for selective arteriography in multiple projections. The C2 was then utilized to selectively catheterize the right hepatic artery for selective arteriography. Coaxial Renegade microcatheter was advanced with a 016 guidewire and used for selective catheterization of a peripheral right hepatic arterial branch supplying the pseudoaneurysm. Once the catheter was positioned as peripherally is possible, a single 2 mm X 2 cm interlock coil was deployed. After 4 minutes, confirmatory arteriogram was obtained showing no further opacification of the pseudoaneurysm. The microcatheter, catheter and sheath were removed and hemostasis achieved with the aid of the Exoseal device after confirmatory femoral arteriography. The patient tolerated the procedure well. COMPLICATIONS: None immediate FINDINGS: Continued opacification of extracapsular pseudoaneurysm from the right hepatic lobe supplied by small right hepatic arterial branch. Technically successful coil embolization of a peripheral feeding branch, with no further opacification of the pseudoaneurysm. IMPRESSION: 1. Technically successful coil embolization of the feeding branch to right hepatic arterial pericapsular pseudoaneurysm. Electronically Signed   By: Lucrezia Europe M.D.   On: 07/23/2021 08:25   IR EMBO ART  VEN HEMORR LYMPH EXTRAV  INC GUIDE  ROADMAPPING  Result Date: 07/23/2021 CLINICAL DATA:  Recent percutaneous liver biopsy, now with increasing abdominal ascites and pericapsular pseudoaneurysm. EXAM: SELECTIVE VISCERAL ARTERIOGRAPHY; IR ULTRASOUND GUIDANCE VASC ACCESS RIGHT; IR EMBO ART VEN HEMORR LYMPH EXTRAV INC GUIDE ROADMAPPING; ADDITIONAL ARTERIOGRAPHY ANESTHESIA/SEDATION: Intravenous Fentanyl 147mcg and Versed 1.5mg  were administered as conscious sedation during continuous monitoring of the patient's level of consciousness and physiological / cardiorespiratory status by the radiology RN, with a total moderate sedation time of 86 minutes. MEDICATIONS: Lidocaine 1% subcutaneous CONTRAST:  34mL OMNIPAQUE IOHEXOL 300 MG/ML  SOLN PROCEDURE: The procedure, risks (including but not limited to bleeding, infection, organ damage ), benefits, and alternatives were explained to the patient. Questions regarding the procedure were encouraged and answered. The patient understands and consents to the  procedure. Right femoral region prepped and draped in usual sterile fashion. Maximal barrier sterile technique was utilized including caps, mask, sterile gowns, sterile gloves, sterile drape, hand hygiene and skin antiseptic. The right common femoral artery was localized under ultrasound. Under real-time ultrasound guidance, the vessel was accessed with a 21-gauge micropuncture needle, exchanged over a 018 guidewire for a transitional dilator, through which a 035 guidewire was advanced. Over this, a 5 [place] vascular sheath was placed, through which a 5 [place] C2 catheter was advanced and used to selectively catheterize the celiac axis for selective arteriography in multiple projections. The C2 was then utilized to selectively catheterize the common hepatic artery artery for selective arteriography in multiple projections. The C2 was then utilized to selectively catheterize the right hepatic artery for selective arteriography. Coaxial Renegade microcatheter was  advanced with a 016 guidewire and used for selective catheterization of a peripheral right hepatic arterial branch supplying the pseudoaneurysm. Once the catheter was positioned as peripherally is possible, a single 2 mm X 2 cm interlock coil was deployed. After 4 minutes, confirmatory arteriogram was obtained showing no further opacification of the pseudoaneurysm. The microcatheter, catheter and sheath were removed and hemostasis achieved with the aid of the Exoseal device after confirmatory femoral arteriography. The patient tolerated the procedure well. COMPLICATIONS: None immediate FINDINGS: Continued opacification of extracapsular pseudoaneurysm from the right hepatic lobe supplied by small right hepatic arterial branch. Technically successful coil embolization of a peripheral feeding branch, with no further opacification of the pseudoaneurysm. IMPRESSION: 1. Technically successful coil embolization of the feeding branch to right hepatic arterial pericapsular pseudoaneurysm. Electronically Signed   By: Lucrezia Europe M.D.   On: 07/23/2021 08:25   CT Angio Abd/Pel W and/or Wo Contrast  Result Date: 07/22/2021 CLINICAL DATA:  Recent liver biopsy. Free fluid on bedside ultrasound. Evaluate for liver bleed. EXAM: CT ANGIOGRAPHY ABDOMEN AND PELVIS WITH CONTRAST AND WITHOUT CONTRAST TECHNIQUE: Multidetector CT imaging of the abdomen and pelvis was performed using the standard protocol during bolus administration of intravenous contrast. Multiplanar reconstructed images and MIPs were obtained and reviewed to evaluate the vascular anatomy. CONTRAST:  19mL OMNIPAQUE IOHEXOL 350 MG/ML SOLN COMPARISON:  06/12/2021 FINDINGS: VASCULAR Aorta: Atherosclerotic calcifications in the abdominal aorta without aneurysm, dissection or significant stenosis. Celiac: Patent without evidence of aneurysm, dissection, vasculitis or significant stenosis. Variant anatomy with an accessory left hepatic artery coming off the left gastric  artery. There is focal contrast extravasation just lateral to the right hepatic lobe on the arterial phase imaging on sequence 6, image 71. This is well seen on the coronal images, sequence 7, image 73. This is a relatively well-defined elongated structure on the arterial coronal arterial images, measuring up to 1.6 cm. This area does not significantly change in size on the delayed images and there is no clear evidence for contrast pooling outside of this isolated elongated structure. Finding is unusual but could represent apseudoaneurysm near the liver capsule. SMA: Patent without evidence of aneurysm, dissection, vasculitis or significant stenosis. Renals: Both renal arteries are patent without evidence of aneurysm, dissection, vasculitis, fibromuscular dysplasia or significant stenosis. IMA: Patent without evidence of aneurysm, dissection, vasculitis or significant stenosis. Inflow: Patent without evidence of aneurysm, dissection, vasculitis or significant stenosis. Proximal Outflow: Proximal femoral arteries are patent bilaterally. Veins: IVC and renal veins are patent. Main portal venous system appears to be patent but limited evaluation of intrahepatic portal veins. There is enlarged and umbilical vein along the anterior abdomen. Iliac veins are patent. Review of  the MIP images confirms the above findings. NON-VASCULAR Lower chest: Bilateral breast implants. No pleural effusions. Patchy densities at lung bases likely represent atelectasis. Hepatobiliary: Liver is heterogeneous. No large discrete liver lesions. Normal appearance of the gallbladder. No significant biliary dilatation. Pancreas: Unremarkable. No pancreatic ductal dilatation or surrounding inflammatory changes. Spleen: Normal in size without focal abnormality. Adrenals/Urinary Tract: Normal appearance of the adrenal glands. Normal appearance of the right kidney. Left renal cysts. Negative for hydronephrosis. Small amount of fluid in the urinary  bladder. Stomach/Bowel: Normal appearance of the stomach. There is no evidence for bowel distention or focal bowel inflammation. Lymphatic: No lymph node enlargement in the abdomen or pelvis. Reproductive: Uterus and bilateral adnexa are unremarkable. Other: Moderate amount of ascites in the abdomen and pelvis. Hounsfield units of the perihepatic ascites is relatively low density measuring between 11-13. Fluid in the cul-de-sac measures approximately 24. Most dense ascites is in the right lower quadrant measuring roughly 26 Hounsfield units. The ascites has increased from the PET-CT on 07/05/2021. Negative for free air. Musculoskeletal: No acute bone abnormality. IMPRESSION: VASCULAR 1. Focal area of contrast extravasation just lateral to the liver capsule in the right hepatic lobe. This location corresponds with the recent biopsy site. This area of contrast extravasation may be contained because it does not significantly enlarge on the delayed images. This could represent an elongated pseudoaneurysm along the capsule measuring roughly 1.6 cm. Dedicated ultrasound of this area with duplex imaging may better evaluate this area. 2.  Aortic Atherosclerosis (ICD10-I70.0). 3. Persistent enlarged umbilical vein. Finding raises concern for underlying portal hypertension. NON-VASCULAR 1. Increased free fluid in the abdomen and pelvis compared to 07/05/2021. Hounsfield units are slightly dense but more dense than the previous PET-CT. Suspect this fluid represents a combination of blood and underlying ascites. 2. Again noted is a heterogeneous liver without a discrete lesion. These results were called by telephone at the time of interpretation on 07/22/2021 at 4:28 pm to provider [facility] , who verbally acknowledged these results. Electronically Signed   By: Markus Daft M.D.   On: 07/22/2021 16:51    Labs:  CBC: Recent Labs    07/22/21 1519 07/23/21 0256 07/23/21 0904 07/24/21 0239  WBC 21.5* 24.6* 26.6* 25.7*   HGB 10.2* 8.4* 8.1* 7.5*  HCT 30.3* 26.1* 25.9* 23.5*  PLT 173 173 181 198     COAGS: Recent Labs    07/19/21 1051 07/23/21 0256  INR 1.2 1.3*     BMP: Recent Labs    07/18/21 0917 07/22/21 1117 07/23/21 0256 07/24/21 0239  NA 134* 130* 129* 131*  K 3.2* 3.4* 3.6 3.2*  CL 102 98 99 102  CO2 24 22 19* 22  GLUCOSE 117* 120* 139* 95  BUN 15 19 25* 27*  CALCIUM 12.9* 11.9* 10.9* 10.5*  CREATININE 0.79 0.79 0.91 0.55  GFRNONAA >60 >60 >60 >60     LIVER FUNCTION TESTS: Recent Labs    07/12/21 0937 07/18/21 0917 07/22/21 1117 07/23/21 0256 07/24/21 1231  BILITOT 1.6* 2.6* 3.3* 3.4*  --   AST 70* 100* 120* 126*  --   ALT 35 49* 54* 56*  --   ALKPHOS 151* 142* 110 91  --   PROT 8.1 8.1 7.5 6.9  --   ALBUMIN 3.4* 3.4* 3.2* 2.7* 2.9*     Assessment and Plan: 61 y.o. female with PMH sig for anxiety, GERD, HLD, HTN, IBS, right breast cancer with recent recurrence in the left breast (lobular carcinoma). She has  had bilat mastectomies with reconstruction; She underwent image guided random right liver biopsy on 07/19/2021 at [facility]- path pending.  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 Doing pretty well. Hgb slow trend down, now 7.5, but hemodynamically stable. Paracentesis performed today, 4.8 L bloody ascites removed with good symptomatic relief.    Electronically Signed: Ascencion Dike, PA-C 07/24/2021, 3:01 PM   I spent a total of 15 Minutes at the the patient's bedside AND on the patient's hospital floor or unit, greater than 50% of which was counseling/coordinating care for hepatic arteriogram with coil embolization of the feeding branch to right hepatic arterial pericapsular pseudoaneurysm.

## 2021-07-25 ENCOUNTER — Telehealth: Payer: Self-pay

## 2021-07-25 ENCOUNTER — Other Ambulatory Visit (HOSPITAL_COMMUNITY): Payer: Self-pay

## 2021-07-25 DIAGNOSIS — Z170 Estrogen receptor positive status [ER+]: Secondary | ICD-10-CM

## 2021-07-25 DIAGNOSIS — C50412 Malignant neoplasm of upper-outer quadrant of left female breast: Secondary | ICD-10-CM

## 2021-07-25 DIAGNOSIS — A419 Sepsis, unspecified organism: Secondary | ICD-10-CM | POA: Diagnosis not present

## 2021-07-25 DIAGNOSIS — R6520 Severe sepsis without septic shock: Secondary | ICD-10-CM | POA: Diagnosis not present

## 2021-07-25 LAB — BASIC METABOLIC PANEL
Anion gap: 8 (ref 5–15)
BUN: 24 mg/dL — ABNORMAL HIGH (ref 8–23)
CO2: 23 mmol/L (ref 22–32)
Calcium: 10.8 mg/dL — ABNORMAL HIGH (ref 8.9–10.3)
Chloride: 99 mmol/L (ref 98–111)
Creatinine, Ser: 0.65 mg/dL (ref 0.44–1.00)
GFR, Estimated: >60 mL/min (ref >60)
Glucose, Bld: 85 mg/dL (ref 70–99)
Potassium: 3.3 mmol/L — ABNORMAL LOW (ref 3.5–5.1)
Sodium: 130 mmol/L — ABNORMAL LOW (ref 135–145)

## 2021-07-25 LAB — CBC
HCT: 24.3 % — ABNORMAL LOW (ref 36.0–46.0)
Hemoglobin: 7.6 g/dL — ABNORMAL LOW (ref 12.0–15.0)
MCH: 29.9 pg (ref 26.0–34.0)
MCHC: 31.3 g/dL (ref 30.0–36.0)
MCV: 95.7 fL (ref 80.0–100.0)
Platelets: 187 10*3/uL (ref 150–400)
RBC: 2.54 MIL/uL — ABNORMAL LOW (ref 3.87–5.11)
RDW: 17.2 % — ABNORMAL HIGH (ref 11.5–15.5)
WBC: 23.7 10*3/uL — ABNORMAL HIGH (ref 4.0–10.5)
nRBC: 3.7 % — ABNORMAL HIGH (ref 0.0–0.2)

## 2021-07-25 MED ORDER — FUROSEMIDE 20 MG PO TABS
20.0000 mg | ORAL_TABLET | Freq: Two times a day (BID) | ORAL | Status: DC
  Administered 2021-07-25 – 2021-07-26 (×2): 20 mg via ORAL
  Filled 2021-07-25 (×2): qty 1

## 2021-07-25 MED ORDER — ABEMACICLIB 50 MG PO TABS: 50.0000 mg | ORAL_TABLET | Freq: Two times a day (BID) | ORAL | 1 refills | Status: DC

## 2021-07-25 MED ORDER — SPIRONOLACTONE 12.5 MG HALF TABLET
12.5000 mg | ORAL_TABLET | Freq: Every day | ORAL | Status: DC
  Administered 2021-07-25: 12.5 mg via ORAL
  Filled 2021-07-25 (×2): qty 1

## 2021-07-25 NOTE — Progress Notes (Signed)
PROGRESS NOTE    Christine Francis  OQH:[id number] DOB: 01-Oct-1960 DOA: 07/22/2021 PCP: Molli Posey, MD   Brief Narrative:  61 years old female with PMH significant for left breast cancer s/p bilateral mastectomy on letrozole therapy, anxiety disorder, hypercalcemia, essential hypertension was sent from cancer center with multiple complaints most notably abdominal pain. Patient was found to have liver lesions and she underwent liver biopsy 4 days ago by IR.  Patient has developed pain around the biopsy area; after following up with oncologist and she was found to have leukocytosis , abdominal distention and confusion.  Admitted for abdominal pain mental status changes and SIRS without clear infectious source.  Pseudoaneurysm on CT, IR consulted and performed coil embolization during hospital stay.   Assessment & Plan:   Newly confirmed metastatic breast cancer Per Oncology -with mets to the liver Rule out carcinomatosis given ascites awaiting paracentesis  Intractable abdominal pain, ascites, POA  SAAG elevated - consistent with portal HTN Initiate spironolactone and Lasix, limit IV fluids, salt intake Ascites fluid sent for pathology, cultures pending  SIRS; without source, POA Sepsis ruled out Patient presented with tachycardia, tachypnea,hypotension, lactic acidosis, leukocytosis which may be reactive in the setting of metastatic disease, and symptomatic ascites. -Discontinue antibiotics, follow cultures/symptoms -Remains afebrile, leukocytosis stable   Acute metabolic encephalopathy, multifactorial  Hyperammonemia: -Improving with supportive care and lactulose, more awake alert oriented per family at bedside today -Continue lactulose, goal 3 bowel movement per day   Pseudoaneurysm: Patient was evaluated by IR recommended H&H every 6 hours. He underwent coil embolization of the bleeding artery.  Hypercalcemia: Patient is following with oncology and getting Zometa for  hypercalcemia.   Serum calcium downtrending appropriately Continue to monitor serum calcium.   Essential hypertension: Resume blood pressure medications once blood pressure improves.   GERD: Continue pantoprazole.  DVT prophylaxis: SCDs only clear Code Status: Full Family Communication: At bedside  Status is: Inpatient  Dispo: The patient is from: Home              Anticipated d/c is to: Home              Anticipated d/c date is: 24-48+ hours              Patient currently not medically stable for discharge  Consultants:  Interventional radiology, oncology not  Procedures:  Coil embolization Paracentesis  Antimicrobials:  Discontinued  Subjective: No acute issues or events overnight, requesting advancement in diet which is certainly reasonable.  Admits to ongoing abdominal distention and worsening abdominal pain but otherwise denies nausea vomiting diarrhea constipation headache fevers chills or chest pain  Objective: Vitals:   07/25/21 0400 07/25/21 0600 07/25/21 0700 07/25/21 0715  BP: (!) 89/43 (!) 104/54 113/61   Pulse: 82 86 88 95  Resp: 20 17 18 14   Temp: 98 F (36.7 C)     TempSrc: Oral     SpO2: 96% 94% 94% 97%  Weight:      Height:       No intake or output data in the 24 hours ending 07/25/21 0724  Filed Weights   07/22/21 1431  Weight: 85.7 kg    Examination:  General:  Pleasantly resting in bed, No acute distress. HEENT:  Normocephalic atraumatic.  Sclerae nonicteric, noninjected.  Extraocular movements intact bilaterally. Neck:  Without mass or deformity.  Trachea is midline. Lungs:  Clear to auscultate bilaterally without rhonchi, wheeze, or rales. Heart:  Regular, rate and rhythm.  Without murmurs, rubs, or  gallops. Abdomen: Taut, distended, nontympanic, with tenderness to palpation Extremities: Without cyanosis, clubbing, edema, or obvious deformity. Vascular:  Dorsalis pedis and posterior tibial pulses palpable bilaterally. Skin:  Warm  and dry, no erythema, no ulcerations.   Data Reviewed: I have personally reviewed following labs and imaging studies  CBC: Recent Labs  Lab 07/18/21 0917 07/19/21 1051 07/22/21 1117 07/22/21 1519 07/23/21 0256 07/23/21 0904 07/24/21 0239 07/25/21 0244  WBC 17.9*   < > 20.0* 21.5* 24.6* 26.6* 25.7* 23.7*  NEUTROABS 11.6*  --  13.5* 14.6*  --   --   --   --   HGB 13.6   < > 10.5* 10.2* 8.4* 8.1* 7.5* 7.6*  HCT 41.3   < > 30.8* 30.3* 26.1* 25.9* 23.5* 24.3*  MCV 88.4   < > 87.3 89.9 92.6 97.4 92.9 95.7  PLT 196   < > 171 173 173 181 198 187   < > = values in this interval not displayed.    Basic Metabolic Panel: Recent Labs  Lab 07/18/21 0917 07/22/21 1117 07/23/21 0256 07/24/21 0239 07/25/21 0244  NA 134* 130* 129* 131* 130*  K 3.2* 3.4* 3.6 3.2* 3.3*  CL 102 98 99 102 99  CO2 24 22 19* 22 23  GLUCOSE 117* 120* 139* 95 85  BUN 15 19 25* 27* 24*  CREATININE 0.79 0.79 0.91 0.55 0.65  CALCIUM 12.9* 11.9* 10.9* 10.5* 10.8*  MG  --   --  2.0 2.1  --   PHOS  --   --  2.2* 1.8*  --     GFR: Estimated Creatinine Clearance: 80.5 mL/min (by C-G formula based on SCr of 0.65 mg/dL). Liver Function Tests: Recent Labs  Lab 07/18/21 0917 07/22/21 1117 07/23/21 0256 07/24/21 1231  AST 100* 120* 126*  --   ALT 49* 54* 56*  --   ALKPHOS 142* 110 91  --   BILITOT 2.6* 3.3* 3.4*  --   PROT 8.1 7.5 6.9  --   ALBUMIN 3.4* 3.2* 2.7* 2.9*    No results for input(s): LIPASE, AMYLASE in the last 168 hours. Recent Labs  Lab 07/22/21 1447 07/23/21 1218  AMMONIA 76* 45*    Coagulation Profile: Recent Labs  Lab 07/19/21 1051 07/23/21 0256  INR 1.2 1.3*    Cardiac Enzymes: No results for input(s): CKTOTAL, CKMB, CKMBINDEX, TROPONINI in the last 168 hours. BNP (last 3 results) No results for input(s): PROBNP in the last 8760 hours. HbA1C: No results for input(s): HGBA1C in the last 72 hours. CBG: No results for input(s): GLUCAP in the last 168 hours. Lipid  Profile: No results for input(s): CHOL, HDL, LDLCALC, TRIG, CHOLHDL, LDLDIRECT in the last 72 hours. Thyroid Function Tests: No results for input(s): TSH, T4TOTAL, FREET4, T3FREE, THYROIDAB in the last 72 hours. Anemia Panel: No results for input(s): VITAMINB12, FOLATE, FERRITIN, TIBC, IRON, RETICCTPCT in the last 72 hours. Sepsis Labs: Recent Labs  Lab 07/22/21 1647 07/22/21 1803 07/23/21 0256 07/23/21 0904  PROCALCITON  --   --  0.23  --   LATICACIDVEN 4.7* 5.6*  --  4.1*     Recent Results (from the past 240 hour(s))  Resp Panel by RT-PCR (Flu A&B, Covid) Nasopharyngeal Swab     Status: None   Collection Time: 07/22/21  5:04 PM   Specimen: Nasopharyngeal Swab; Nasopharyngeal(NP) swabs in vial transport medium  Result Value Ref Range Status   SARS Coronavirus 2 by RT PCR NEGATIVE NEGATIVE Final    Comment: (NOTE)  SARS-CoV-2 target nucleic acids are NOT DETECTED.  The SARS-CoV-2 RNA is generally detectable in upper respiratory specimens during the acute phase of infection. The lowest concentration of SARS-CoV-2 viral copies this assay can detect is 138 copies/mL. A negative result does not preclude SARS-Cov-2 infection and should not be used as the sole basis for treatment or other patient management decisions. A negative result may occur with  improper specimen collection/handling, submission of specimen other than nasopharyngeal swab, presence of viral mutation(s) within the areas targeted by this assay, and inadequate number of viral copies(<138 copies/mL). A negative result must be combined with clinical observations, patient history, and epidemiological information. The expected result is Negative.  Fact Sheet for Patients:  EntrepreneurPulse.com.au  Fact Sheet for Healthcare Providers:  IncredibleEmployment.be  This test is no t yet approved or cleared by the [place] FDA and  has been authorized for detection and/or  diagnosis of SARS-CoV-2 by FDA under an Emergency Use Authorization (EUA). This EUA will remain  in effect (meaning this test can be used) for the duration of the COVID-19 declaration under Section 564(b)(1) of the Act, 21 U.S.C.section 360bbb-3(b)(1), unless the authorization is terminated  or revoked sooner.       Influenza A by PCR NEGATIVE NEGATIVE Final   Influenza B by PCR NEGATIVE NEGATIVE Final    Comment: (NOTE) The Xpert Xpress SARS-CoV-2/FLU/RSV plus assay is intended as an aid in the diagnosis of influenza from Nasopharyngeal swab specimens and should not be used as a sole basis for treatment. Nasal washings and aspirates are unacceptable for Xpert Xpress SARS-CoV-2/FLU/RSV testing.  Fact Sheet for Patients: EntrepreneurPulse.com.au  Fact Sheet for Healthcare Providers: IncredibleEmployment.be  This test is not yet approved or cleared by the [place] FDA and has been authorized for detection and/or diagnosis of SARS-CoV-2 by FDA under an Emergency Use Authorization (EUA). This EUA will remain in effect (meaning this test can be used) for the duration of the COVID-19 declaration under Section 564(b)(1) of the Act, 21 U.S.C. section 360bbb-3(b)(1), unless the authorization is terminated or revoked.  Performed at [facility], [place] [address], [place], [place] [address]   Urine Culture     Status: None   Collection Time: 07/23/21 12:30 AM   Specimen: Urine, Clean Catch  Result Value Ref Range Status   Specimen Description   Final    URINE, CLEAN CATCH Performed at [facility], [place] [address] [place], [place] [address]    Special Requests   Final    NONE Performed at [facility], [place] [address], [place], [place] [address]    Culture   Final    NO GROWTH Performed at [facility], [place] [address] [place], [place] [address]    Report Status 07/24/2021 FINAL   Final  Body fluid culture w Gram Stain     Status: None (Preliminary result)   Collection Time: 07/24/21  2:26 PM   Specimen: PATH Cytology Peritoneal fluid  Result Value Ref Range Status   Specimen Description   Final    PERITONEAL Performed at [place] [address] [place], [place] [address]    Special Requests   Final    NONE Performed at [facility], [place] [address] [place], [place] [address]    Gram Stain   Final    FEW WBC PRESENT,BOTH PMN AND MONONUCLEAR NO ORGANISMS SEEN Performed at [facility], [place] [address] [place], [place]  [address]    Culture PENDING  Incomplete   Report Status PENDING  Incomplete          Radiology Studies: US Paracentesis  Result Date: 07/24/2021 INDICATION: Metastatic breast cancer to the liver. Ascites. Recent hemorrhage following percutaneous liver biopsy requiring embolization. Request for diagnostic and therapeutic paracentesis EXAM: ULTRASOUND GUIDED RIGHT LOWER QUADRANT PARACENTESIS MEDICATIONS: 1% plain lidocaine, 5 mL COMPLICATIONS: None immediate. PROCEDURE: Informed written consent was obtained from the patient after a discussion of the risks, benefits and alternatives to treatment. A timeout was performed prior to the initiation of the procedure. Initial ultrasound scanning demonstrates a large amount of ascites within the right lower abdominal quadrant. The right lower abdomen was prepped and draped in the usual sterile fashion. 1% lidocaine was used for local anesthesia. Following this, a 19 gauge, 7-cm, Yueh catheter was introduced. An ultrasound image was saved for documentation purposes. The paracentesis was performed. The catheter was removed and a dressing was applied. The patient tolerated the procedure well without immediate post procedural complication. FINDINGS: A total of approximately 4.8 L of dark bloody fluid was removed. Samples were sent to the laboratory as requested by  the clinical team. IMPRESSION: Successful ultrasound-guided paracentesis yielding 4.8 liters of bloody peritoneal fluid. Read by: Ascencion Dike PA-C Electronically Signed   By: Corrie Mckusick D.O.   On: 07/24/2021 16:09    Scheduled Meds:  atenolol  50 mg Oral Daily   And   hydrochlorothiazide  25 mg Oral Daily   Chlorhexidine Gluconate Cloth  6 each Topical Daily   docusate sodium  100 mg Oral BID   lactulose  20 g Oral TID   mouth rinse  15 mL Mouth Rinse BID   melatonin  3 mg Oral Once   montelukast  10 mg Oral QHS   omeprazole  20 mg Oral Daily   Continuous Infusions:     LOS: 3 days   Time spent: 2min  Waynetta Metheny C Demont Linford, DO [facility]  If 7PM-7AM, please contact night-coverage [email or web address]  07/25/2021, 7:24 AM

## 2021-07-25 NOTE — Progress Notes (Signed)
Successfully faxed [facility] to [phone]

## 2021-07-25 NOTE — Evaluation (Signed)
Physical Therapy Evaluation Patient Details Name: Christine Francis MRN: [id number] DOB: 09-Jun-1961 Today's Date: 07/25/2021  History of Present Illness  61 years old female with PMH significant for left breast cancer s/p bilateral mastectomy on letrozole therapy, anxiety disorder, hypercalcemia, essential hypertension was sent from cancer center with multiple complaints most notably abdominal pain. Patient was found to have liver lesions and she underwent liver biopsy 4 days ago by IR.  Patient has developed pain around the biopsy area; after following up with oncologist and she was found to have leukocytosis , abdominal distention and confusion.  Admitted for abdominal pain mental status changes and SIRS without clear infectious source.  Pseudoaneurysm on CT, IR consulted and performed coil embolization during hospital stay  Clinical Impression  Pt was some balance deficits, higher level coordination deficits, and vision changes. Pt also reports slurred speech and these all started about 1 month ago. Negative for nystagmus at end range, however eyes are not always aligned and with gaze to the left pt reports blurred vision and gaze to the right she has double vision and with movement of her head her vestibular and visual systems are not aligned and makes her feel unsteady.     She is still doing fairly well with her gait with supervision, hand held assist. I recommend a RW for steadying when needed, will need one ordered for home use.   Will continue to follow while her. Thank you      Recommendations for follow up therapy are one component of a multi-disciplinary discharge planning process, led by the attending physician.  Recommendations may be updated based on patient status, additional functional criteria and insurance authorization.  Follow Up Recommendations Outpatient PT (recommending OPOT for vision changes)    Assistance Recommended at Discharge Frequent or constant  Supervision/Assistance  Patient can return home with the following  A little help with walking and/or transfers;A little help with bathing/dressing/bathroom;Direct supervision/assist for medications management;Assistance with cooking/housework    Equipment Recommendations Rolling walker (2 wheels)  Recommendations for Other Services       Functional Status Assessment Patient has had a recent decline in their functional status and demonstrates the ability to make significant improvements in function in a reasonable and predictable amount of time.     Precautions / Restrictions Precautions Precautions: Fall      Mobility  Bed Mobility Overal bed mobility: Needs Assistance Bed Mobility: Supine to Sit;Sit to Supine     Supine to sit: Supervision Sit to supine: Supervision   General bed mobility comments: due to slight dizziness upon sitting    Transfers Overall transfer level: Needs assistance Equipment used: 1 person hand held assist Transfers: Sit to/from Stand Sit to Stand: Min guard                Ambulation/Gait Ambulation/Gait assistance: Min guard Gait Distance (Feet): 250 Feet Assistive device: 1 person hand held assist Gait Pattern/deviations: Step-through pattern Gait velocity: slow, guarded     General Gait Details: small steps slightly unsteady at times, needs hand held assist to steady and react to balance lost. When turning head or changing gaze, or talking while walking, balance becomes worsened. Vision changes also affecting stability with gaze to Left blurry and gae to the right double vision.  Stairs            Wheelchair Mobility    Modified Rankin (Stroke Patients Only)       Balance  Pertinent Vitals/Pain Pain Assessment: No/denies pain    Home Living Family/patient expects to be discharged to:: Private residence Living Arrangements: Alone Available Help at  Discharge: Family (sister is going to stay with pt after DC . Sister states pt will not be alone) Type of Home: House Home Access: Stairs to enter Entrance Stairs-Rails: Left Entrance Stairs-Number of Steps: 3   Home Layout: Two level;Full bath on main level;Able to live on main level with bedroom/bathroom Home Equipment: None      Prior Function Prior Level of Function : Independent/Modified Independent;History of Falls (last six months)             Mobility Comments: Pt states she was indepedent and driving until a few weeks ago. She had a recetn fall when she just fell forward and collapsed.       Hand Dominance        Extremity/Trunk Assessment        Lower Extremity Assessment Lower Extremity Assessment: Generalized weakness;RLE deficits/detail;LLE deficits/detail RLE Deficits / Details: Tested both R and LE and didn't not see one side weaker than the other, just both sides seems a bit deminished in strength for all movements. proprioceoption and coodination intact however at time sit was a bit delayed and slow. RLE Sensation: WNL RLE Coordination: decreased fine motor LLE Deficits / Details: Tested both R and LE and didn't not see one side weaker than the other, just both sides seems a bit deminished in strength for all movements. proprioceoption and coodination intact however at time sit was a bit delayed and slow. LLE Sensation: WNL LLE Coordination: decreased fine motor    Cervical / Trunk Assessment Cervical / Trunk Assessment: Normal  Communication   Communication: No difficulties;Other (comment) (some slurred speech , not back to normal and very dry mouth)  Cognition Arousal/Alertness: Awake/alert Behavior During Therapy: WFL for tasks assessed/performed Overall Cognitive Status: Within Functional Limits for tasks assessed                                          General Comments      Exercises     Assessment/Plan    PT Assessment  Patient needs continued PT services  PT Problem List Decreased strength;Decreased activity tolerance;Decreased mobility;Decreased balance       PT Treatment Interventions DME instruction;Gait training;Functional mobility training;Therapeutic activities;Therapeutic exercise;Balance training;Patient/family education    PT Goals (Current goals can be found in the Care Plan section)  Acute Rehab PT Goals Patient Stated Goal: I want to feel better PT Goal Formulation: With patient Time For Goal Achievement: 08/08/21 Potential to Achieve Goals: Good    Frequency 7X/week     Co-evaluation               AM-PAC PT "6 Clicks" Mobility  Outcome Measure Help needed turning from your back to your side while in a flat bed without using bedrails?: None Help needed moving from lying on your back to sitting on the side of a flat bed without using bedrails?: None Help needed moving to and from a bed to a chair (including a wheelchair)?: A Little Help needed standing up from a chair using your arms (e.g., wheelchair or bedside chair)?: A Little Help needed to walk in hospital room?: A Little Help needed climbing 3-5 steps with a railing? : A Little 6 Click Score: 20    End of  Session   Activity Tolerance: Patient tolerated treatment well Patient left: with call bell/phone within reach;in chair;with family/visitor present Nurse Communication: Mobility status PT Visit Diagnosis: Unsteadiness on feet (R26.81);Other abnormalities of gait and mobility (R26.89)    Time: 9311-2162 PT Time Calculation (min) (ACUTE ONLY): 45 min   Charges:   PT Evaluation $PT Eval Low Complexity: 1 Low          Aleks Nawrot, PT, MPT Acute Rehabilitation Services Office: [phone] Pager: [phone] 07/25/2021   Clide Dales 07/25/2021, 6:27 PM

## 2021-07-25 NOTE — Telephone Encounter (Signed)
Oral Oncology Patient Advocate Encounter  Prior Authorization for Christine Francis has been approved.    PA# [id number] Effective dates: 07/25/21 through 07/25/22  Patient must fill at [facility] will continue to follow.   [facility] [place] Phone [phone] Fax [phone] 07/25/2021 11:09 AM

## 2021-07-25 NOTE — Progress Notes (Signed)
PT Note  Patient Details Name: Christine Francis MRN: [id number] DOB: 1961-04-22  Pt was ambulating around the unit with nursing when I arrived, I did notice  pt was slightly unsteady, and continued to assess at bedside.   Pt have general weakness, some balance deficits, visual changes (double when she looks to the right and blurry to the left with eye not trackig together). Spoke with pt and pts sister about OPPT and OPOT for continued rehab.   Will need RW prior to DC.   Full write up to follow.    Clide Dales 07/25/2021, 3:52 PM Kassandra Meriweather, PT, MPT Acute Rehabilitation Services Office: [phone] Pager: [phone] 07/25/2021

## 2021-07-25 NOTE — Telephone Encounter (Signed)
Oral Oncology Pharmacist Encounter  Received new prescription for abemaciclib (Verzenio) for the treatment of ER positive, HER2 negative metastatic breast cancer in conjunction with faslodex, planned duration until disease progression or unacceptable toxicity.  Labs from 07/25/2021 (BMP, CMP- patient is inpatient) assessed, no interventions needed.  Current medication list in Epic reviewed, DDIs with verzenio identified: none  Evaluated chart and no patient barriers to medication adherence noted.   Patient agreement for treatment documented in MD note on 07/24/2021.  Prescription has been e-scribed to the [facility] for benefits analysis and approval.  Oral Oncology Clinic will continue to follow for insurance authorization, copayment issues, initial counseling and start date.  Drema Halon, PharmD Hematology/Oncology Clinical Pharmacist [facility] [phone] 07/25/2021 7:49 AM

## 2021-07-25 NOTE — Telephone Encounter (Signed)
Oral Oncology Patient Advocate Encounter   Received notification from [facility] that prior authorization for Verzenio is required.   PA submitted on CoverMyMeds Key [id number] Status is pending   Oral Oncology Clinic will continue to follow.  [facility] [place] Phone [phone] Fax [phone] 07/25/2021 7:46 AM

## 2021-07-26 DIAGNOSIS — A419 Sepsis, unspecified organism: Secondary | ICD-10-CM | POA: Diagnosis not present

## 2021-07-26 DIAGNOSIS — R6520 Severe sepsis without septic shock: Secondary | ICD-10-CM | POA: Diagnosis not present

## 2021-07-26 LAB — BASIC METABOLIC PANEL
Anion gap: 8 (ref 5–15)
BUN: 18 mg/dL (ref 8–23)
CO2: 21 mmol/L — ABNORMAL LOW (ref 22–32)
Calcium: 11.2 mg/dL — ABNORMAL HIGH (ref 8.9–10.3)
Chloride: 97 mmol/L — ABNORMAL LOW (ref 98–111)
Creatinine, Ser: 0.55 mg/dL (ref 0.44–1.00)
GFR, Estimated: >60 mL/min (ref >60)
Glucose, Bld: 100 mg/dL — ABNORMAL HIGH (ref 70–99)
Potassium: 2.8 mmol/L — ABNORMAL LOW (ref 3.5–5.1)
Sodium: 126 mmol/L — ABNORMAL LOW (ref 135–145)

## 2021-07-26 LAB — VITAMIN B12: Vitamin B-12: 1284 pg/mL — ABNORMAL HIGH (ref 180–914)

## 2021-07-26 MED ORDER — SPIRONOLACTONE 25 MG PO TABS
25.0000 mg | ORAL_TABLET | Freq: Every day | ORAL | Status: DC
  Administered 2021-07-27: 25 mg via ORAL
  Filled 2021-07-26: qty 1

## 2021-07-26 MED ORDER — FUROSEMIDE 40 MG PO TABS
40.0000 mg | ORAL_TABLET | Freq: Two times a day (BID) | ORAL | Status: DC
  Administered 2021-07-26 – 2021-07-27 (×2): 40 mg via ORAL
  Filled 2021-07-26 (×2): qty 1

## 2021-07-26 MED ORDER — POTASSIUM CHLORIDE CRYS ER 20 MEQ PO TBCR
40.0000 meq | EXTENDED_RELEASE_TABLET | Freq: Two times a day (BID) | ORAL | Status: DC
  Administered 2021-07-26 – 2021-07-27 (×3): 40 meq via ORAL
  Filled 2021-07-26 (×3): qty 2

## 2021-07-26 NOTE — Progress Notes (Signed)
Physical Therapy Treatment Patient Details Name: Christine Francis MRN: [id number] DOB: [birth year] Today's Date: 07/26/2021   History of Present Illness 61 years old female with PMH significant for left breast cancer s/p bilateral mastectomy on letrozole therapy, anxiety disorder, hypercalcemia, essential hypertension was sent from cancer center with multiple complaints most notably abdominal pain. Patient was found to have liver lesions and she underwent liver biopsy 4 days ago by IR.  Patient has developed pain around the biopsy area; after following up with oncologist and she was found to have leukocytosis , abdominal distention and confusion.  Admitted for abdominal pain mental status changes and SIRS without clear infectious source.  Pseudoaneurysm on CT, IR consulted and performed coil embolization during hospital stay    PT Comments    Pt ambulated 250' with RW, no loss of balance with head turns. Pt reports some blurry vision/mild dizziness when she turns her head while walking but had no loss of balance. RW recommended for home.    Recommendations for follow up therapy are one component of a multi-disciplinary discharge planning process, led by the attending physician.  Recommendations may be updated based on patient status, additional functional criteria and insurance authorization.  Follow Up Recommendations  Outpatient PT (recommending OPOT for vision changes)     Assistance Recommended at Discharge Frequent or constant Supervision/Assistance  Patient can return home with the following A little help with walking and/or transfers;A little help with bathing/dressing/bathroom;Direct supervision/assist for medications management;Assistance with cooking/housework   Equipment Recommendations  Rolling walker (2 wheels)    Recommendations for Other Services       Precautions / Restrictions Precautions Precautions: Fall Restrictions Weight Bearing Restrictions: No     Mobility   Bed Mobility Overal bed mobility: Modified Independent Bed Mobility: Supine to Sit;Sit to Supine     Supine to sit: Modified independent (Device/Increase time) Sit to supine: Modified independent (Device/Increase time)   General bed mobility comments: used bedrail    Transfers Overall transfer level: Needs assistance   Transfers: Sit to/from Stand Sit to Stand: Supervision           General transfer comment: VCs hand placement    Ambulation/Gait Ambulation/Gait assistance: Supervision Gait Distance (Feet): 260 Feet Assistive device: Rolling walker (2 wheels) Gait Pattern/deviations: Step-through pattern;Decreased stride length Gait velocity: mildly decr     General Gait Details: steady, no loss of balance, reports blurry vision/dizziness with head turns is a little bit improved today, no loss of balance with head turns while walking   Stairs             Wheelchair Mobility    Modified Rankin (Stroke Patients Only)       Balance Overall balance assessment: Modified Independent                                          Cognition Arousal/Alertness: Awake/alert Behavior During Therapy: WFL for tasks assessed/performed Overall Cognitive Status: Within Functional Limits for tasks assessed                                          Exercises      General Comments        Pertinent Vitals/Pain Pain Assessment: No/denies pain    Home Living  Prior Function            PT Goals (current goals can now be found in the care plan section) Acute Rehab PT Goals Patient Stated Goal: I want to feel better PT Goal Formulation: With patient Time For Goal Achievement: 08/08/21 Potential to Achieve Goals: Good Progress towards PT goals: Progressing toward goals    Frequency    Min 3X/week      PT Plan Current plan remains appropriate    Co-evaluation              AM-PAC  PT "6 Clicks" Mobility   Outcome Measure  Help needed turning from your back to your side while in a flat bed without using bedrails?: None Help needed moving from lying on your back to sitting on the side of a flat bed without using bedrails?: None Help needed moving to and from a bed to a chair (including a wheelchair)?: A Little Help needed standing up from a chair using your arms (e.g., wheelchair or bedside chair)?: A Little Help needed to walk in hospital room?: A Little Help needed climbing 3-5 steps with a railing? : A Little 6 Click Score: 20    End of Session   Activity Tolerance: Patient tolerated treatment well Patient left: with call bell/phone within reach;with family/visitor present;in bed Nurse Communication: Mobility status PT Visit Diagnosis: Unsteadiness on feet (R26.81);Other abnormalities of gait and mobility (R26.89)     Time: 1443-1500 PT Time Calculation (min) (ACUTE ONLY): 17 min  Charges:  $Gait Training: 8-22 mins                    Blondell Reveal Kistler PT 07/26/2021  Acute Rehabilitation Services Pager [phone] Office [phone]

## 2021-07-26 NOTE — Discharge Summary (Signed)
Physician Discharge Summary  Christine Francis ZDG:[id number] DOB: [birth year] DOA: 07/22/2021  PCP: Molli Posey, MD  Admit date: 07/22/2021 Discharge date: 07/27/2021  Admitted From: Home Disposition: Home  Recommendations for Outpatient Follow-up:  Follow up with PCP in 1-2 weeks Please obtain BMP/CBC in one week Please follow up with oncology as scheduled on Wednesday:  Home Health: Outpatient physical therapy Equipment/Devices: Rolling Walker  Discharge Condition: Stable CODE STATUS: Full Diet recommendation: As tolerated  Brief/Interim Summary: 61 years old female with PMH significant for left breast cancer s/p bilateral mastectomy on letrozole therapy, anxiety disorder, hypercalcemia, essential hypertension was sent from cancer center with multiple complaints most notably abdominal pain. Patient was found to have liver lesions and she underwent liver biopsy 4 days ago by IR.  Patient has developed pain around the biopsy area; after following up with oncologist and she was found to have leukocytosis , abdominal distention and confusion.  Admitted for abdominal pain mental status changes and SIRS without clear infectious source.  Pseudoaneurysm on CT, IR consulted and performed coil embolization during hospital stay.   Patient admitted as above with worsening abdominal pain found to have profound ascites in the setting of metastatic breast cancer to the liver with notable portal hypertension requiring paracentesis.  Patient improved with increased p.o. Lasix and spironolactone requiring increased potassium.  Initial concerns over sepsis given patient's tachycardia and elevated leukocytosis likely in the setting of pain and metastatic disease.  Antibiotics discontinued without any lingering fevers, and continued downtrending leukocytosis.  Patient had complicated stay with newly discovered pseudoaneurysm requiring coil embolization of bleeding artery, tolerated procedure well with no  further episodes or issues with bleeding.  Patient otherwise stable and agreeable for discharge home, will continue home medications with additional spironolactone and furosemide as above.  Follow-up Wednesday per schedule  Assessment & Plan:   Newly confirmed metastatic breast cancer Per Oncology -with mets to the liver Rule out carcinomatosis given ascites awaiting paracentesis   Intractable abdominal pain, ascites, POA  SAAG elevated - consistent with portal HTN Continue spironolactone and Lasix, limit fluids, and salt intake as discussed   SIRS; without source, POA Sepsis ruled out Patient presented with tachycardia, tachypnea,hypotension, lactic acidosis, leukocytosis which may be reactive in the setting of metastatic disease, and symptomatic ascites. -Discontinue antibiotics, follow cultures/symptoms -Remains afebrile, leukocytosis downtrending appropriately   Acute metabolic encephalopathy, multifactorial  Hyperammonemia: -Improving with supportive care and lactulose, more awake alert oriented per family at bedside today -Continue lactulose, goal 3 bowel movement per day   Pseudoaneurysm: Patient was evaluated by IR s/p coil embolization -tolerated well   Hypercalcemia: Patient is following with oncology and getting Zometa for hypercalcemia.   Serum calcium downtrending appropriately Continue to monitor serum calcium.   Essential hypertension: Resume blood pressure medications once blood pressure improves.   GERD: Continue pantoprazole.  Discharge Instructions   Allergies as of 07/27/2021       Reactions   Ampicillin Hives   Dilaudid [hydromorphone Hcl] Nausea And Vomiting   Erythromycin Hives, Other (See Comments)   Penicillins Hives   Sulfa Drugs Cross Reactors Hives   Declomycin [demeclocycline] Rash, Other (See Comments)   Childhood allergy        Medication List     STOP taking these medications    atenolol-chlorthalidone 50-25 MG tablet Commonly  known as: TENORETIC   letrozole 2.5 MG tablet Commonly known as: FEMARA       TAKE these medications    abemaciclib 50 MG tablet Commonly  known as: VERZENIO Take 1 tablet (50 mg total) by mouth 2 (two) times daily. Swallow tablets whole. Do not chew, crush, or split tablets before swallowing.   albuterol 108 (90 Base) MCG/ACT inhaler Commonly known as: VENTOLIN HFA Inhale 1-2 puffs into the lungs every 6 (six) hours as needed for wheezing or shortness of breath.   ALPRAZolam 0.5 MG tablet Commonly known as: XANAX Take 0.5 mg by mouth daily as needed for anxiety.   atenolol 50 MG tablet Commonly known as: TENORMIN Take 1 tablet (50 mg total) by mouth daily. Start taking on: July 28, 2021   Breo Ellipta 200-25 MCG/ACT Aepb Generic drug: fluticasone furoate-vilanterol Inhale 1 puff into the lungs daily as needed (shortness of breath).   EPINEPHrine 0.3 mg/0.3 mL Soaj injection Commonly known as: EPI-PEN Inject 0.3 mg into the muscle once as needed for anaphylaxis.   furosemide 40 MG tablet Commonly known as: LASIX Take 1 tablet (40 mg total) by mouth 2 (two) times daily.   hydrochlorothiazide 25 MG tablet Commonly known as: HYDRODIURIL Take 1 tablet (25 mg total) by mouth daily. Start taking on: July 28, 2021   lactulose 10 GM/15ML solution Commonly known as: CHRONULAC Take 30 mLs (20 g total) by mouth 3 (three) times daily.   montelukast 10 MG tablet Commonly known as: SINGULAIR Take 10 mg by mouth daily.   Mucinex Sinus-Max 5-10-200-325 MG Tabs Generic drug: Phenylephrine-DM-GG-APAP Take 2 tablets by mouth daily as needed (congestion).   omeprazole 20 MG tablet Commonly known as: PRILOSEC OTC Take 20 mg by mouth daily.   potassium chloride SA 20 MEQ tablet Commonly known as: KLOR-CON M Take 2 tablets (40 mEq total) by mouth 2 (two) times daily. What changed:  how much to take when to take this   spironolactone 25 MG tablet Commonly known as:  ALDACTONE Take 1 tablet (25 mg total) by mouth daily. Start taking on: July 28, 2021               Durable Medical Equipment  (From admission, onward)           Start     Ordered   07/27/21 1255  For home use only DME Walker rolling  Once       Question Answer Comment  Walker: With 5 Inch Wheels   Patient needs a walker to treat with the following condition Ambulatory dysfunction      07/27/21 1254            Allergies  Allergen Reactions   Ampicillin Hives   Dilaudid [Hydromorphone Hcl] Nausea And Vomiting   Erythromycin Hives and Other (See Comments)   Penicillins Hives   Sulfa Drugs Cross Reactors Hives   Declomycin [Demeclocycline] Rash and Other (See Comments)    Childhood allergy    Consultations: IR, Oncology,    Procedures/Studies: DG Chest 2 View  Result Date: 07/22/2021 CLINICAL DATA:  Shortness of breath after liver biopsy. EXAM: CHEST - 2 VIEW COMPARISON:  None. FINDINGS: The heart size and mediastinal contours are within normal limits. Right lung is clear. Minimal left lingular subsegmental atelectasis or scarring is noted. The visualized skeletal structures are unremarkable. IMPRESSION: Minimal left lingular subsegmental atelectasis or scarring. Electronically Signed   By: Marijo Conception M.D.   On: 07/22/2021 15:12   CT Head Wo Contrast  Result Date: 07/22/2021 CLINICAL DATA:  Head trauma moderate to severe. Brain Mets suspected. EXAM: CT HEAD WITHOUT CONTRAST TECHNIQUE: Contiguous axial images were obtained from  the base of the skull through the vertex without intravenous contrast. COMPARISON:  None. FINDINGS: Brain: No evidence of acute infarction, hemorrhage, hydrocephalus, extra-axial collection or mass lesion/mass effect. Vascular: No hyperdense vessel or unexpected calcification. Skull: Normal. Negative for fracture or focal lesion. Sinuses/Orbits: No acute finding. Other: None. IMPRESSION: No acute intracranial abnormality. No appreciable  mass on this noncontrast enhanced examination. Electronically Signed   By: Keane Police D.O.   On: 07/22/2021 15:12   IR Angiogram Visceral Selective  Result Date: 07/23/2021 CLINICAL DATA:  Recent percutaneous liver biopsy, now with increasing abdominal ascites and pericapsular pseudoaneurysm. EXAM: SELECTIVE VISCERAL ARTERIOGRAPHY; IR ULTRASOUND GUIDANCE VASC ACCESS RIGHT; IR EMBO ART VEN HEMORR LYMPH EXTRAV INC GUIDE ROADMAPPING; ADDITIONAL ARTERIOGRAPHY ANESTHESIA/SEDATION: Intravenous Fentanyl 158mcg and Versed 1.5mg  were administered as conscious sedation during continuous monitoring of the patient's level of consciousness and physiological / cardiorespiratory status by the radiology RN, with a total moderate sedation time of 86 minutes. MEDICATIONS: Lidocaine 1% subcutaneous CONTRAST:  57mL OMNIPAQUE IOHEXOL 300 MG/ML  SOLN PROCEDURE: The procedure, risks (including but not limited to bleeding, infection, organ damage ), benefits, and alternatives were explained to the patient. Questions regarding the procedure were encouraged and answered. The patient understands and consents to the procedure. Right femoral region prepped and draped in usual sterile fashion. Maximal barrier sterile technique was utilized including caps, mask, sterile gowns, sterile gloves, sterile drape, hand hygiene and skin antiseptic. The right common femoral artery was localized under ultrasound. Under real-time ultrasound guidance, the vessel was accessed with a 21-gauge micropuncture needle, exchanged over a 018 guidewire for a transitional dilator, through which a 035 guidewire was advanced. Over this, a 5 [place] vascular sheath was placed, through which a 5 [place] C2 catheter was advanced and used to selectively catheterize the celiac axis for selective arteriography in multiple projections. The C2 was then utilized to selectively catheterize the common hepatic artery artery for selective arteriography in multiple projections.  The C2 was then utilized to selectively catheterize the right hepatic artery for selective arteriography. Coaxial Renegade microcatheter was advanced with a 016 guidewire and used for selective catheterization of a peripheral right hepatic arterial branch supplying the pseudoaneurysm. Once the catheter was positioned as peripherally is possible, a single 2 mm X 2 cm interlock coil was deployed. After 4 minutes, confirmatory arteriogram was obtained showing no further opacification of the pseudoaneurysm. The microcatheter, catheter and sheath were removed and hemostasis achieved with the aid of the Exoseal device after confirmatory femoral arteriography. The patient tolerated the procedure well. COMPLICATIONS: None immediate FINDINGS: Continued opacification of extracapsular pseudoaneurysm from the right hepatic lobe supplied by small right hepatic arterial branch. Technically successful coil embolization of a peripheral feeding branch, with no further opacification of the pseudoaneurysm. IMPRESSION: 1. Technically successful coil embolization of the feeding branch to right hepatic arterial pericapsular pseudoaneurysm. Electronically Signed   By: Lucrezia Europe M.D.   On: 07/23/2021 08:25   IR Angiogram Selective Each Additional Vessel  Result Date: 07/23/2021 CLINICAL DATA:  Recent percutaneous liver biopsy, now with increasing abdominal ascites and pericapsular pseudoaneurysm. EXAM: SELECTIVE VISCERAL ARTERIOGRAPHY; IR ULTRASOUND GUIDANCE VASC ACCESS RIGHT; IR EMBO ART VEN HEMORR LYMPH EXTRAV INC GUIDE ROADMAPPING; ADDITIONAL ARTERIOGRAPHY ANESTHESIA/SEDATION: Intravenous Fentanyl 178mcg and Versed 1.5mg  were administered as conscious sedation during continuous monitoring of the patient's level of consciousness and physiological / cardiorespiratory status by the radiology RN, with a total moderate sedation time of 86 minutes. MEDICATIONS: Lidocaine 1% subcutaneous CONTRAST:  67mL OMNIPAQUE IOHEXOL 300  MG/ML  SOLN  PROCEDURE: The procedure, risks (including but not limited to bleeding, infection, organ damage ), benefits, and alternatives were explained to the patient. Questions regarding the procedure were encouraged and answered. The patient understands and consents to the procedure. Right femoral region prepped and draped in usual sterile fashion. Maximal barrier sterile technique was utilized including caps, mask, sterile gowns, sterile gloves, sterile drape, hand hygiene and skin antiseptic. The right common femoral artery was localized under ultrasound. Under real-time ultrasound guidance, the vessel was accessed with a 21-gauge micropuncture needle, exchanged over a 018 guidewire for a transitional dilator, through which a 035 guidewire was advanced. Over this, a 5 [place] vascular sheath was placed, through which a 5 [place] C2 catheter was advanced and used to selectively catheterize the celiac axis for selective arteriography in multiple projections. The C2 was then utilized to selectively catheterize the common hepatic artery artery for selective arteriography in multiple projections. The C2 was then utilized to selectively catheterize the right hepatic artery for selective arteriography. Coaxial Renegade microcatheter was advanced with a 016 guidewire and used for selective catheterization of a peripheral right hepatic arterial branch supplying the pseudoaneurysm. Once the catheter was positioned as peripherally is possible, a single 2 mm X 2 cm interlock coil was deployed. After 4 minutes, confirmatory arteriogram was obtained showing no further opacification of the pseudoaneurysm. The microcatheter, catheter and sheath were removed and hemostasis achieved with the aid of the Exoseal device after confirmatory femoral arteriography. The patient tolerated the procedure well. COMPLICATIONS: None immediate FINDINGS: Continued opacification of extracapsular pseudoaneurysm from the right hepatic lobe supplied by small  right hepatic arterial branch. Technically successful coil embolization of a peripheral feeding branch, with no further opacification of the pseudoaneurysm. IMPRESSION: 1. Technically successful coil embolization of the feeding branch to right hepatic arterial pericapsular pseudoaneurysm. Electronically Signed   By: Lucrezia Europe M.D.   On: 07/23/2021 08:25   IR Angiogram Selective Each Additional Vessel  Result Date: 07/23/2021 CLINICAL DATA:  Recent percutaneous liver biopsy, now with increasing abdominal ascites and pericapsular pseudoaneurysm. EXAM: SELECTIVE VISCERAL ARTERIOGRAPHY; IR ULTRASOUND GUIDANCE VASC ACCESS RIGHT; IR EMBO ART VEN HEMORR LYMPH EXTRAV INC GUIDE ROADMAPPING; ADDITIONAL ARTERIOGRAPHY ANESTHESIA/SEDATION: Intravenous Fentanyl 184mcg and Versed 1.5mg  were administered as conscious sedation during continuous monitoring of the patient's level of consciousness and physiological / cardiorespiratory status by the radiology RN, with a total moderate sedation time of 86 minutes. MEDICATIONS: Lidocaine 1% subcutaneous CONTRAST:  7mL OMNIPAQUE IOHEXOL 300 MG/ML  SOLN PROCEDURE: The procedure, risks (including but not limited to bleeding, infection, organ damage ), benefits, and alternatives were explained to the patient. Questions regarding the procedure were encouraged and answered. The patient understands and consents to the procedure. Right femoral region prepped and draped in usual sterile fashion. Maximal barrier sterile technique was utilized including caps, mask, sterile gowns, sterile gloves, sterile drape, hand hygiene and skin antiseptic. The right common femoral artery was localized under ultrasound. Under real-time ultrasound guidance, the vessel was accessed with a 21-gauge micropuncture needle, exchanged over a 018 guidewire for a transitional dilator, through which a 035 guidewire was advanced. Over this, a 5 [place] vascular sheath was placed, through which a 5 [place] C2 catheter was  advanced and used to selectively catheterize the celiac axis for selective arteriography in multiple projections. The C2 was then utilized to selectively catheterize the common hepatic artery artery for selective arteriography in multiple projections. The C2 was then utilized to selectively catheterize the right hepatic artery  for selective arteriography. Coaxial Renegade microcatheter was advanced with a 016 guidewire and used for selective catheterization of a peripheral right hepatic arterial branch supplying the pseudoaneurysm. Once the catheter was positioned as peripherally is possible, a single 2 mm X 2 cm interlock coil was deployed. After 4 minutes, confirmatory arteriogram was obtained showing no further opacification of the pseudoaneurysm. The microcatheter, catheter and sheath were removed and hemostasis achieved with the aid of the Exoseal device after confirmatory femoral arteriography. The patient tolerated the procedure well. COMPLICATIONS: None immediate FINDINGS: Continued opacification of extracapsular pseudoaneurysm from the right hepatic lobe supplied by small right hepatic arterial branch. Technically successful coil embolization of a peripheral feeding branch, with no further opacification of the pseudoaneurysm. IMPRESSION: 1. Technically successful coil embolization of the feeding branch to right hepatic arterial pericapsular pseudoaneurysm. Electronically Signed   By: Lucrezia Europe M.D.   On: 07/23/2021 08:25   IR Angiogram Selective Each Additional Vessel  Result Date: 07/23/2021 CLINICAL DATA:  Recent percutaneous liver biopsy, now with increasing abdominal ascites and pericapsular pseudoaneurysm. EXAM: SELECTIVE VISCERAL ARTERIOGRAPHY; IR ULTRASOUND GUIDANCE VASC ACCESS RIGHT; IR EMBO ART VEN HEMORR LYMPH EXTRAV INC GUIDE ROADMAPPING; ADDITIONAL ARTERIOGRAPHY ANESTHESIA/SEDATION: Intravenous Fentanyl 165mcg and Versed 1.5mg  were administered as conscious sedation during continuous  monitoring of the patient's level of consciousness and physiological / cardiorespiratory status by the radiology RN, with a total moderate sedation time of 86 minutes. MEDICATIONS: Lidocaine 1% subcutaneous CONTRAST:  31mL OMNIPAQUE IOHEXOL 300 MG/ML  SOLN PROCEDURE: The procedure, risks (including but not limited to bleeding, infection, organ damage ), benefits, and alternatives were explained to the patient. Questions regarding the procedure were encouraged and answered. The patient understands and consents to the procedure. Right femoral region prepped and draped in usual sterile fashion. Maximal barrier sterile technique was utilized including caps, mask, sterile gowns, sterile gloves, sterile drape, hand hygiene and skin antiseptic. The right common femoral artery was localized under ultrasound. Under real-time ultrasound guidance, the vessel was accessed with a 21-gauge micropuncture needle, exchanged over a 018 guidewire for a transitional dilator, through which a 035 guidewire was advanced. Over this, a 5 [place] vascular sheath was placed, through which a 5 [place] C2 catheter was advanced and used to selectively catheterize the celiac axis for selective arteriography in multiple projections. The C2 was then utilized to selectively catheterize the common hepatic artery artery for selective arteriography in multiple projections. The C2 was then utilized to selectively catheterize the right hepatic artery for selective arteriography. Coaxial Renegade microcatheter was advanced with a 016 guidewire and used for selective catheterization of a peripheral right hepatic arterial branch supplying the pseudoaneurysm. Once the catheter was positioned as peripherally is possible, a single 2 mm X 2 cm interlock coil was deployed. After 4 minutes, confirmatory arteriogram was obtained showing no further opacification of the pseudoaneurysm. The microcatheter, catheter and sheath were removed and hemostasis achieved with  the aid of the Exoseal device after confirmatory femoral arteriography. The patient tolerated the procedure well. COMPLICATIONS: None immediate FINDINGS: Continued opacification of extracapsular pseudoaneurysm from the right hepatic lobe supplied by small right hepatic arterial branch. Technically successful coil embolization of a peripheral feeding branch, with no further opacification of the pseudoaneurysm. IMPRESSION: 1. Technically successful coil embolization of the feeding branch to right hepatic arterial pericapsular pseudoaneurysm. Electronically Signed   By: Lucrezia Europe M.D.   On: 07/23/2021 08:25   NM PET Image Initial (PI) Skull Base To Thigh  Result Date: 07/05/2021 CLINICAL DATA:  Initial treatment strategy for left-sided breast cancer. Suspicion of metastatic disease based on laboratory values. EXAM: NUCLEAR MEDICINE PET SKULL BASE TO THIGH TECHNIQUE: 9.5 mCi F-18 FDG was injected intravenously. Full-ring PET imaging was performed from the skull base to thigh after the radiotracer. CT data was obtained and used for attenuation correction and anatomic localization. Fasting blood glucose: 84 mg/dl COMPARISON:  Neck CT 06/28/2021. Bone scan of 06/26/2021. Chest abdomen and pelvic CTs of 06/12/2021 FINDINGS: Mediastinal blood pool activity: SUV max 2.3 Liver activity: SUV max NA NECK: No cervical nodal hypermetabolism. Symmetric [place] tonsil hypermetabolism is favored to be physiologic. Incidental CT findings: Multiple small bilateral cervical nodes, none pathologic by size criteria. CHEST: And inferior left axillary node is somewhat rounded at 1.0 cm and corresponds to hypermetabolism at a S.U.V. max of 3.7 on 66/4. Other more cephalad bilateral axillary nodes maintain their fatty hila and demonstrate low-level hypermetabolism, including at up to a S.U.V. max of 2.9 on 59/4, favored to be benign. No pulmonary parenchymal hypermetabolism. Incidental CT findings: Deferred to recent diagnostic CT.  Aortic atherosclerosis. Bilateral breast implants ABDOMEN/PELVIS: Diffuse moderate hepatic hypermetabolism which given hepatic morphology and clinical history is suspicious for "pseudo cirrhosis". Example diffuse heterogeneous hypermetabolism at a S.U.V. max of 7.5 within the right hepatic lobe on 110/4 and within the left hepatic lobe at a S.U.V. max of 9.0 on 130/4. No abdominopelvic nodal hypermetabolism. Perineal hypermetabolism at a S.U.V. max of 23.5 is without CT correlate favored to be related to urinary contamination. Incidental CT findings: Deferred to recent diagnostic CT. Similar trace perihepatic ascites. Portal venous hypertension with a recanalized paraumbilical vein. Small volume pelvic fluid, new. SKELETON: Mildly heterogeneously increased marrow activity throughout. Example in the right sacrum at a S.U.V. max of 4.6 and within the left humeral head at a S.U.V. max of 3.3. No CT correlates. Incidental CT findings: none IMPRESSION: 1. Relatively diffuse moderate hepatic hypermetabolism in the setting of apparent cirrhosis on CT. 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 Differential considerations include infiltrative hepatocellular carcinoma. Recommend tissue sampling. 2. Hypermetabolic left axillary node, more equivocal but suspicious for metastatic disease. 3. Mildly heterogeneously increased marrow activity, nonspecific. If the patient is eventually diagnosed with metastatic breast cancer, early osseous metastasis would be a concern. 4. Portal venous hypertension with slight increase in abdominopelvic fluid. 5. Perineal hypermetabolism which is favored to be related to urinary contamination. Consider physical exam correlation. Electronically Signed   By: Abigail Miyamoto M.D.   On: 07/05/2021 21:22   US BIOPSY (LIVER)  Result Date: 07/19/2021 INDICATION: History of breast cancer.  Concern for tumor replacement of liver. EXAM:  ULTRASOUND GUIDED LIVER BIOPSY, NON-TARGETED COMPARISON:  CT chest abdomen pelvis, 06/12/2021. PET-CT, 07/06/2019 MEDICATIONS: None ANESTHESIA/SEDATION: Fentanyl 50 mcg IV; Versed 1 mg IV Total Moderate Sedation time: 20 minutes; The patient was continuously monitored during the procedure by the interventional radiology nurse under my direct supervision. COMPLICATIONS: None immediate. PROCEDURE: Informed written consent was obtained from the patient after a discussion of the risks, benefits and alternatives to treatment. The patient understands and consents the procedure. A timeout was performed prior to the initiation of the procedure. Ultrasound scanning was performed of the right upper abdominal quadrant and the procedure was planned. The right upper abdomen was prepped and draped in the usual sterile fashion. The overlying soft tissues were anesthetized with 1% lidocaine with epinephrine. A 17 gauge, 6.8 cm co-axial needle was advanced into a peripheral aspect  of the right lobe of the liver and 3 core biopsies were obtained with an 18 gauge core device under direct ultrasound guidance. The co-axial needle track was embolized with the administration of a Gel-Foam slurry. Superficial hemostasis was obtained with manual compression. Post procedural scanning was negative for definitive area of hemorrhage. A dressing was placed. The patient tolerated the procedure well without immediate post procedural complication. FINDINGS: Heterogeneous hepatic parenchymal echotexture, with nodular contour. IMPRESSION: Successful ultrasound-guided liver biopsy, as above. Michaelle Birks, MD [facility] Radiology Electronically Signed   By: Michaelle Birks M.D.   On: 07/19/2021 16:14   US Paracentesis  Result Date: 07/24/2021 INDICATION: Metastatic breast cancer to the liver. Ascites. Recent hemorrhage following percutaneous liver biopsy requiring embolization. Request for diagnostic  and therapeutic paracentesis EXAM: ULTRASOUND GUIDED RIGHT LOWER QUADRANT PARACENTESIS MEDICATIONS: 1% plain lidocaine, 5 mL COMPLICATIONS: None immediate. PROCEDURE: Informed written consent was obtained from the patient after a discussion of the risks, benefits and alternatives to treatment. A timeout was performed prior to the initiation of the procedure. Initial ultrasound scanning demonstrates a large amount of ascites within the right lower abdominal quadrant. The right lower abdomen was prepped and draped in the usual sterile fashion. 1% lidocaine was used for local anesthesia. Following this, a 19 gauge, 7-cm, Yueh catheter was introduced. An ultrasound image was saved for documentation purposes. The paracentesis was performed. The catheter was removed and a dressing was applied. The patient tolerated the procedure well without immediate post procedural complication. FINDINGS: A total of approximately 4.8 L of dark bloody fluid was removed. Samples were sent to the laboratory as requested by the clinical team. IMPRESSION: Successful ultrasound-guided paracentesis yielding 4.8 liters of bloody peritoneal fluid. Read by: Ascencion Dike PA-C Electronically Signed   By: Corrie Mckusick D.O.   On: 07/24/2021 16:09   IR US Guide Vasc Access Right  Result Date: 07/23/2021 CLINICAL DATA:  Recent percutaneous liver biopsy, now with increasing abdominal ascites and pericapsular pseudoaneurysm. EXAM: SELECTIVE VISCERAL ARTERIOGRAPHY; IR ULTRASOUND GUIDANCE VASC ACCESS RIGHT; IR EMBO ART VEN HEMORR LYMPH EXTRAV INC GUIDE ROADMAPPING; ADDITIONAL ARTERIOGRAPHY ANESTHESIA/SEDATION: Intravenous Fentanyl 159mcg and Versed 1.5mg  were administered as conscious sedation during continuous monitoring of the patient's level of consciousness and physiological / cardiorespiratory status by the radiology RN, with a total moderate sedation time of 86 minutes. MEDICATIONS: Lidocaine 1% subcutaneous CONTRAST:  61mL OMNIPAQUE IOHEXOL  300 MG/ML  SOLN PROCEDURE: The procedure, risks (including but not limited to bleeding, infection, organ damage ), benefits, and alternatives were explained to the patient. Questions regarding the procedure were encouraged and answered. The patient understands and consents to the procedure. Right femoral region prepped and draped in usual sterile fashion. Maximal barrier sterile technique was utilized including caps, mask, sterile gowns, sterile gloves, sterile drape, hand hygiene and skin antiseptic. The right common femoral artery was localized under ultrasound. Under real-time ultrasound guidance, the vessel was accessed with a 21-gauge micropuncture needle, exchanged over a 018 guidewire for a transitional dilator, through which a 035 guidewire was advanced. Over this, a 5 [place] vascular sheath was placed, through which a 5 [place] C2 catheter was advanced and used to selectively catheterize the celiac axis for selective arteriography in multiple projections. The C2 was then utilized to selectively catheterize the common hepatic artery artery for selective arteriography in multiple projections. The C2 was then utilized to selectively catheterize the right hepatic artery for selective arteriography. Coaxial Renegade microcatheter was advanced with a 016 guidewire  and used for selective catheterization of a peripheral right hepatic arterial branch supplying the pseudoaneurysm. Once the catheter was positioned as peripherally is possible, a single 2 mm X 2 cm interlock coil was deployed. After 4 minutes, confirmatory arteriogram was obtained showing no further opacification of the pseudoaneurysm. The microcatheter, catheter and sheath were removed and hemostasis achieved with the aid of the Exoseal device after confirmatory femoral arteriography. The patient tolerated the procedure well. COMPLICATIONS: None immediate FINDINGS: Continued opacification of extracapsular pseudoaneurysm from the right hepatic lobe  supplied by small right hepatic arterial branch. Technically successful coil embolization of a peripheral feeding branch, with no further opacification of the pseudoaneurysm. IMPRESSION: 1. Technically successful coil embolization of the feeding branch to right hepatic arterial pericapsular pseudoaneurysm. Electronically Signed   By: Lucrezia Europe M.D.   On: 07/23/2021 08:25   CT PARATHYROID 4D NECK W/WO  Result Date: 06/28/2021 CLINICAL DATA:  Hyperparathyroidism.  History breast cancer. EXAM: CT NECK WITH AND WITHOUT CONTRAST TECHNIQUE: Multidetector CT imaging of the neck was performed without and with intravenous contrast. Three phase scanning in the neck, precontrast, arterial phase, and venous phase for detection of parathyroid adenoma. CONTRAST:  72mL ISOVUE-370 IOPAMIDOL (ISOVUE-370) INJECTION 76% COMPARISON:  None. FINDINGS: Pharynx and larynx: Normal. No mass or swelling. Salivary glands: No inflammation, mass, or stone. Thyroid: Thyroid normal in size. 6 mm hypoenhancing nodule left lobe of thyroid. Small calcifications adjacent to the thyroid bilaterally. Enhancing soft tissue nodule posterior and superior to the left upper pole of the thyroid. This measures approximately 3 x 4 x 9 mm. This is best seen on arterial phase and washes out on venous phase. Reference image series 6, image 41. Possible parathyroid adenoma. Lymph nodes: No enlarged lymph nodes in the neck. Scattered small lymph nodes bilaterally. Vascular: Normal vascular enhancement. Limited intracranial: Not imaged Skeleton: Disc degeneration and spurring C5-6 and C6-7. No acute skeletal abnormality. Upper chest: Lung apices clear bilaterally. Other: None IMPRESSION: 1. 3 x 4 x 9 mm hyperenhancing soft tissue nodule posterior and superior to the left lobe of the thyroid. Possible parathyroid adenoma. No other suspicious parathyroid adenoma identified 2. 6 mm hypoenhancing nodule left lobe of thyroid. No further imaging required. (Ref: J Am  Coll Radiol. 2015 Feb;12(2): 143-50). Electronically Signed   By: Franchot Gallo M.D.   On: 06/28/2021 16:30   IR EMBO ART  VEN HEMORR LYMPH EXTRAV  INC GUIDE ROADMAPPING  Result Date: 07/23/2021 CLINICAL DATA:  Recent percutaneous liver biopsy, now with increasing abdominal ascites and pericapsular pseudoaneurysm. EXAM: SELECTIVE VISCERAL ARTERIOGRAPHY; IR ULTRASOUND GUIDANCE VASC ACCESS RIGHT; IR EMBO ART VEN HEMORR LYMPH EXTRAV INC GUIDE ROADMAPPING; ADDITIONAL ARTERIOGRAPHY ANESTHESIA/SEDATION: Intravenous Fentanyl 159mcg and Versed 1.5mg  were administered as conscious sedation during continuous monitoring of the patient's level of consciousness and physiological / cardiorespiratory status by the radiology RN, with a total moderate sedation time of 86 minutes. MEDICATIONS: Lidocaine 1% subcutaneous CONTRAST:  79mL OMNIPAQUE IOHEXOL 300 MG/ML  SOLN PROCEDURE: The procedure, risks (including but not limited to bleeding, infection, organ damage ), benefits, and alternatives were explained to the patient. Questions regarding the procedure were encouraged and answered. The patient understands and consents to the procedure. Right femoral region prepped and draped in usual sterile fashion. Maximal barrier sterile technique was utilized including caps, mask, sterile gowns, sterile gloves, sterile drape, hand hygiene and skin antiseptic. The right common femoral artery was localized under ultrasound. Under real-time ultrasound guidance, the vessel was accessed with a 21-gauge micropuncture needle,  exchanged over a 018 guidewire for a transitional dilator, through which a 035 guidewire was advanced. Over this, a 5 [place] vascular sheath was placed, through which a 5 [place] C2 catheter was advanced and used to selectively catheterize the celiac axis for selective arteriography in multiple projections. The C2 was then utilized to selectively catheterize the common hepatic artery artery for selective arteriography in  multiple projections. The C2 was then utilized to selectively catheterize the right hepatic artery for selective arteriography. Coaxial Renegade microcatheter was advanced with a 016 guidewire and used for selective catheterization of a peripheral right hepatic arterial branch supplying the pseudoaneurysm. Once the catheter was positioned as peripherally is possible, a single 2 mm X 2 cm interlock coil was deployed. After 4 minutes, confirmatory arteriogram was obtained showing no further opacification of the pseudoaneurysm. The microcatheter, catheter and sheath were removed and hemostasis achieved with the aid of the Exoseal device after confirmatory femoral arteriography. The patient tolerated the procedure well. COMPLICATIONS: None immediate FINDINGS: Continued opacification of extracapsular pseudoaneurysm from the right hepatic lobe supplied by small right hepatic arterial branch. Technically successful coil embolization of a peripheral feeding branch, with no further opacification of the pseudoaneurysm. IMPRESSION: 1. Technically successful coil embolization of the feeding branch to right hepatic arterial pericapsular pseudoaneurysm. Electronically Signed   By: Lucrezia Europe M.D.   On: 07/23/2021 08:25   CT Angio Abd/Pel W and/or Wo Contrast  Result Date: 07/22/2021 CLINICAL DATA:  Recent liver biopsy. Free fluid on bedside ultrasound. Evaluate for liver bleed. EXAM: CT ANGIOGRAPHY ABDOMEN AND PELVIS WITH CONTRAST AND WITHOUT CONTRAST TECHNIQUE: Multidetector CT imaging of the abdomen and pelvis was performed using the standard protocol during bolus administration of intravenous contrast. Multiplanar reconstructed images and MIPs were obtained and reviewed to evaluate the vascular anatomy. CONTRAST:  176mL OMNIPAQUE IOHEXOL 350 MG/ML SOLN COMPARISON:  06/12/2021 FINDINGS: VASCULAR Aorta: Atherosclerotic calcifications in the abdominal aorta without aneurysm, dissection or significant stenosis. Celiac: Patent  without evidence of aneurysm, dissection, vasculitis or significant stenosis. Variant anatomy with an accessory left hepatic artery coming off the left gastric artery. There is focal contrast extravasation just lateral to the right hepatic lobe on the arterial phase imaging on sequence 6, image 71. This is well seen on the coronal images, sequence 7, image 73. This is a relatively well-defined elongated structure on the arterial coronal arterial images, measuring up to 1.6 cm. This area does not significantly change in size on the delayed images and there is no clear evidence for contrast pooling outside of this isolated elongated structure. Finding is unusual but could represent apseudoaneurysm near the liver capsule. SMA: Patent without evidence of aneurysm, dissection, vasculitis or significant stenosis. Renals: Both renal arteries are patent without evidence of aneurysm, dissection, vasculitis, fibromuscular dysplasia or significant stenosis. IMA: Patent without evidence of aneurysm, dissection, vasculitis or significant stenosis. Inflow: Patent without evidence of aneurysm, dissection, vasculitis or significant stenosis. Proximal Outflow: Proximal femoral arteries are patent bilaterally. Veins: IVC and renal veins are patent. Main portal venous system appears to be patent but limited evaluation of intrahepatic portal veins. There is enlarged and umbilical vein along the anterior abdomen. Iliac veins are patent. Review of the MIP images confirms the above findings. NON-VASCULAR Lower chest: Bilateral breast implants. No pleural effusions. Patchy densities at lung bases likely represent atelectasis. Hepatobiliary: Liver is heterogeneous. No large discrete liver lesions. Normal appearance of the gallbladder. No significant biliary dilatation. Pancreas: Unremarkable. No pancreatic ductal dilatation or surrounding inflammatory changes. Spleen:  Normal in size without focal abnormality. Adrenals/Urinary Tract: Normal  appearance of the adrenal glands. Normal appearance of the right kidney. Left renal cysts. Negative for hydronephrosis. Small amount of fluid in the urinary bladder. Stomach/Bowel: Normal appearance of the stomach. There is no evidence for bowel distention or focal bowel inflammation. Lymphatic: No lymph node enlargement in the abdomen or pelvis. Reproductive: Uterus and bilateral adnexa are unremarkable. Other: Moderate amount of ascites in the abdomen and pelvis. Hounsfield units of the perihepatic ascites is relatively low density measuring between 11-13. Fluid in the cul-de-sac measures approximately 24. Most dense ascites is in the right lower quadrant measuring roughly 26 Hounsfield units. The ascites has increased from the PET-CT on 07/05/2021. Negative for free air. Musculoskeletal: No acute bone abnormality. IMPRESSION: VASCULAR 1. Focal area of contrast extravasation just lateral to the liver capsule in the right hepatic lobe. This location corresponds with the recent biopsy site. This area of contrast extravasation may be contained because it does not significantly enlarge on the delayed images. This could represent an elongated pseudoaneurysm along the capsule measuring roughly 1.6 cm. Dedicated ultrasound of this area with duplex imaging may better evaluate this area. 2.  Aortic Atherosclerosis (ICD10-I70.0). 3. Persistent enlarged umbilical vein. Finding raises concern for underlying portal hypertension. NON-VASCULAR 1. Increased free fluid in the abdomen and pelvis compared to 07/05/2021. Hounsfield units are slightly dense but more dense than the previous PET-CT. Suspect this fluid represents a combination of blood and underlying ascites. 2. Again noted is a heterogeneous liver without a discrete lesion. These results were called by telephone at the time of interpretation on 07/22/2021 at 4:28 pm to provider [facility] , who verbally acknowledged these results. Electronically Signed   By: Markus Daft M.D.   On: 07/22/2021 16:51     Subjective: No acute issues/events overnight   Discharge Exam: Vitals:   07/27/21 0455 07/27/21 1317  BP: 111/65 109/63  Pulse: 89 83  Resp: 18 18  Temp: 97.6 F (36.4 C)   SpO2: 98% 97%   Vitals:   07/26/21 1251 07/26/21 2048 07/27/21 0455 07/27/21 1317  BP: (!) 105/59 (!) 112/59 111/65 109/63  Pulse: 86 88 89 83  Resp: 16 16 18 18   Temp: 98.6 F (37 C) 98.5 F (36.9 C) 97.6 F (36.4 C)   TempSrc:  Oral Oral   SpO2: 97% 98% 98% 97%  Weight:      Height:        General: Pt is alert, awake, not in acute distress Cardiovascular: RRR, S1/S2 +, no rubs, no gallops Respiratory: CTA bilaterally, no wheezing, no rhonchi Abdominal: Soft, moderately distended but non tender, bowel sounds + Extremities: no edema, no cyanosis    The results of significant diagnostics from this hospitalization (including imaging, microbiology, ancillary and laboratory) are listed below for reference.     Microbiology: Recent Results (from the past 240 hour(s))  Resp Panel by RT-PCR (Flu A&B, Covid) Nasopharyngeal Swab     Status: None   Collection Time: 07/22/21  5:04 PM   Specimen: Nasopharyngeal Swab; Nasopharyngeal(NP) swabs in vial transport medium  Result Value Ref Range Status   SARS Coronavirus 2 by RT PCR NEGATIVE NEGATIVE Final    Comment: (NOTE) SARS-CoV-2 target nucleic acids are NOT DETECTED.  The SARS-CoV-2 RNA is generally detectable in upper respiratory specimens during the acute phase of infection. The lowest concentration of SARS-CoV-2 viral copies this assay can detect is 138 copies/mL. A negative result does not preclude SARS-Cov-2  infection and should not be used as the sole basis for treatment or other patient management decisions. A negative result may occur with  improper specimen collection/handling, submission of specimen other than nasopharyngeal swab, presence of viral mutation(s) within the areas targeted by this assay,  and inadequate number of viral copies(<138 copies/mL). A negative result must be combined with clinical observations, patient history, and epidemiological information. The expected result is Negative.  Fact Sheet for Patients:  EntrepreneurPulse.com.au  Fact Sheet for Healthcare Providers:  IncredibleEmployment.be  This test is no t yet approved or cleared by the [place] FDA and  has been authorized for detection and/or diagnosis of SARS-CoV-2 by FDA under an Emergency Use Authorization (EUA). This EUA will remain  in effect (meaning this test can be used) for the duration of the COVID-19 declaration under Section 564(b)(1) of the Act, 21 U.S.C.section 360bbb-3(b)(1), unless the authorization is terminated  or revoked sooner.       Influenza A by PCR NEGATIVE NEGATIVE Final   Influenza B by PCR NEGATIVE NEGATIVE Final    Comment: (NOTE) The Xpert Xpress SARS-CoV-2/FLU/RSV plus assay is intended as an aid in the diagnosis of influenza from Nasopharyngeal swab specimens and should not be used as a sole basis for treatment. Nasal washings and aspirates are unacceptable for Xpert Xpress SARS-CoV-2/FLU/RSV testing.  Fact Sheet for Patients: EntrepreneurPulse.com.au  Fact Sheet for Healthcare Providers: IncredibleEmployment.be  This test is not yet approved or cleared by the [place] FDA and has been authorized for detection and/or diagnosis of SARS-CoV-2 by FDA under an Emergency Use Authorization (EUA). This EUA will remain in effect (meaning this test can be used) for the duration of the COVID-19 declaration under Section 564(b)(1) of the Act, 21 U.S.C. section 360bbb-3(b)(1), unless the authorization is terminated or revoked.  Performed at [facility], [place] [address] [place], [place] [address]   Urine Culture     Status: None   Collection Time: 07/23/21 12:30 AM    Specimen: Urine, Clean Catch  Result Value Ref Range Status   Specimen Description   Final    URINE, CLEAN CATCH Performed at [facility], [place] [address], [place], [place] [address]    Special Requests   Final    NONE Performed at [facility], [place] [address] [place], [place] [address]    Culture   Final    NO GROWTH Performed at [facility], [place] [address] [place], [place] [address]    Report Status 07/24/2021 FINAL  Final  Body fluid culture w Gram Stain     Status: None (Preliminary result)   Collection Time: 07/24/21  2:26 PM   Specimen: PATH Cytology Peritoneal fluid  Result Value Ref Range Status   Specimen Description   Final    PERITONEAL Performed at [place] [address], [place], [place] [address]    Special Requests   Final    NONE Performed at [facility], [place] [address], [place], [place] [address]    Gram Stain   Final    FEW WBC PRESENT,BOTH PMN AND MONONUCLEAR NO ORGANISMS SEEN    Culture   Final    NO GROWTH 3 DAYS Performed at [facility], [place] [address], [place],  [address]    Report Status PENDING  Incomplete     Labs: BNP (last 3 results) Recent Labs    07/22/21 1447  BNP 62.0   Basic  Metabolic Panel: Recent Labs  Lab 07/23/21 0256 07/24/21 0239 07/25/21 0244 07/26/21 0741 07/27/21 0503  NA 129* 131* 130* 126* 129*  K 3.6 3.2* 3.3* 2.8* 3.1*  CL 99 102 99 97* 101  CO2 19* 22 23 21* 21*  GLUCOSE 139* 95 85 100* 71  BUN 25* 27* 24* 18 14  CREATININE 0.91 0.55 0.65 0.55 0.59  CALCIUM 10.9* 10.5* 10.8* 11.2* 11.0*  MG 2.0 2.1  --   --   --   PHOS 2.2* 1.8*  --   --   --    Liver Function Tests: Recent Labs  Lab 07/22/21 1117 07/23/21 0256 07/24/21 1231  AST 120* 126*  --   ALT 54* 56*  --   ALKPHOS 110 91  --   BILITOT 3.3* 3.4*  --   PROT 7.5 6.9  --   ALBUMIN 3.2* 2.7* 2.9*   No results for input(s): LIPASE, AMYLASE  in the last 168 hours. Recent Labs  Lab 07/22/21 1447 07/23/21 1218  AMMONIA 76* 45*   CBC: Recent Labs  Lab 07/22/21 1117 07/22/21 1117 07/22/21 1519 07/23/21 0256 07/23/21 0904 07/24/21 0239 07/25/21 0244 07/27/21 0503  WBC 20.0*   < > 21.5* 24.6* 26.6* 25.7* 23.7* 17.9*  NEUTROABS 13.5*  --  14.6*  --   --   --   --   --   HGB 10.5*   < > 10.2* 8.4* 8.1* 7.5* 7.6* 8.3*  HCT 30.8*  --  30.3* 26.1* 25.9* 23.5* 24.3* 26.1*  MCV 87.3  --  89.9 92.6 97.4 92.9 95.7 92.9  PLT 171   < > 173 173 181 198 187 231   < > = values in this interval not displayed.   Cardiac Enzymes: No results for input(s): CKTOTAL, CKMB, CKMBINDEX, TROPONINI in the last 168 hours. BNP: Invalid input(s): POCBNP CBG: No results for input(s): GLUCAP in the last 168 hours. D-Dimer No results for input(s): DDIMER in the last 72 hours. Hgb A1c No results for input(s): HGBA1C in the last 72 hours. Lipid Profile No results for input(s): CHOL, HDL, LDLCALC, TRIG, CHOLHDL, LDLDIRECT in the last 72 hours. Thyroid function studies No results for input(s): TSH, T4TOTAL, T3FREE, THYROIDAB in the last 72 hours.  Invalid input(s): FREET3 Anemia work up Recent Labs    07/26/21 0741  VITAMINB12 1,284*   Urinalysis    Component Value Date/Time   COLORURINE YELLOW 07/23/2021 0030   APPEARANCEUR CLEAR 07/23/2021 0030   LABSPEC 1.015 07/23/2021 0030   PHURINE 6.0 07/23/2021 0030   GLUCOSEU NEGATIVE 07/23/2021 0030   HGBUR LARGE (A) 07/23/2021 0030   BILIRUBINUR NEGATIVE 07/23/2021 0030   KETONESUR NEGATIVE 07/23/2021 0030   PROTEINUR NEGATIVE 07/23/2021 0030   NITRITE NEGATIVE 07/23/2021 0030   LEUKOCYTESUR NEGATIVE 07/23/2021 0030   Sepsis Labs Invalid input(s): PROCALCITONIN,  WBC,  LACTICIDVEN Microbiology Recent Results (from the past 240 hour(s))  Resp Panel by RT-PCR (Flu A&B, Covid) Nasopharyngeal Swab     Status: None   Collection Time: 07/22/21  5:04 PM   Specimen: Nasopharyngeal Swab;  Nasopharyngeal(NP) swabs in vial transport medium  Result Value Ref Range Status   SARS Coronavirus 2 by RT PCR NEGATIVE NEGATIVE Final    Comment: (NOTE) SARS-CoV-2 target nucleic acids are NOT DETECTED.  The SARS-CoV-2 RNA is generally detectable in upper respiratory specimens during the acute phase of infection. The lowest concentration of SARS-CoV-2 viral copies this assay can detect is 138 copies/mL. A negative result does not preclude  SARS-Cov-2 infection and should not be used as the sole basis for treatment or other patient management decisions. A negative result may occur with  improper specimen collection/handling, submission of specimen other than nasopharyngeal swab, presence of viral mutation(s) within the areas targeted by this assay, and inadequate number of viral copies(<138 copies/mL). A negative result must be combined with clinical observations, patient history, and epidemiological information. The expected result is Negative.  Fact Sheet for Patients:  EntrepreneurPulse.com.au  Fact Sheet for Healthcare Providers:  IncredibleEmployment.be  This test is no t yet approved or cleared by the [place] FDA and  has been authorized for detection and/or diagnosis of SARS-CoV-2 by FDA under an Emergency Use Authorization (EUA). This EUA will remain  in effect (meaning this test can be used) for the duration of the COVID-19 declaration under Section 564(b)(1) of the Act, 21 U.S.C.section 360bbb-3(b)(1), unless the authorization is terminated  or revoked sooner.       Influenza A by PCR NEGATIVE NEGATIVE Final   Influenza B by PCR NEGATIVE NEGATIVE Final    Comment: (NOTE) The Xpert Xpress SARS-CoV-2/FLU/RSV plus assay is intended as an aid in the diagnosis of influenza from Nasopharyngeal swab specimens and should not be used as a sole basis for treatment. Nasal washings and aspirates are unacceptable for Xpert Xpress  SARS-CoV-2/FLU/RSV testing.  Fact Sheet for Patients: EntrepreneurPulse.com.au  Fact Sheet for Healthcare Providers: IncredibleEmployment.be  This test is not yet approved or cleared by the [place] FDA and has been authorized for detection and/or diagnosis of SARS-CoV-2 by FDA under an Emergency Use Authorization (EUA). This EUA will remain in effect (meaning this test can be used) for the duration of the COVID-19 declaration under Section 564(b)(1) of the Act, 21 U.S.C. section 360bbb-3(b)(1), unless the authorization is terminated or revoked.  Performed at [facility], [place] [address], [place], [place] [address]   Urine Culture     Status: None   Collection Time: 07/23/21 12:30 AM   Specimen: Urine, Clean Catch  Result Value Ref Range Status   Specimen Description   Final    URINE, CLEAN CATCH Performed at [facility], [place] [address], [place], [place] [address]    Special Requests   Final    NONE Performed at [facility], [place] [address] [place], [place] [address]    Culture   Final    NO GROWTH Performed at [facility], [place] [address], [place], [place] [address]    Report Status 07/24/2021 FINAL  Final  Body fluid culture w Gram Stain     Status: None (Preliminary result)   Collection Time: 07/24/21  2:26 PM   Specimen: PATH Cytology Peritoneal fluid  Result Value Ref Range Status   Specimen Description   Final    PERITONEAL Performed at [place] [address], [place], [place] [address]    Special Requests   Final    NONE Performed at [facility], [place] [address], [place], [place] [address]    Gram Stain   Final    FEW WBC PRESENT,BOTH PMN AND MONONUCLEAR NO ORGANISMS SEEN    Culture   Final    NO GROWTH 3 DAYS Performed at [facility], [place] [address], [place], [place] [address]    Report Status  PENDING  Incomplete     Time coordinating discharge: Over 30 minutes  SIGNED:   Little Ishikawa, DO [facility] 07/27/2021,  4:53 PM Pager   If 7PM-7AM, please contact night-coverage [email or web address]

## 2021-07-26 NOTE — Progress Notes (Signed)
PROGRESS NOTE    Christine Francis  ZCH:[id number] DOB: Jun 19, 1961 DOA: 07/22/2021 PCP: Molli Posey, MD   Brief Narrative:  61 years old female with PMH significant for left breast cancer s/p bilateral mastectomy on letrozole therapy, anxiety disorder, hypercalcemia, essential hypertension was sent from cancer center with multiple complaints most notably abdominal pain. Patient was found to have liver lesions and she underwent liver biopsy 4 days ago by IR.  Patient has developed pain around the biopsy area; after following up with oncologist and she was found to have leukocytosis , abdominal distention and confusion.  Admitted for abdominal pain mental status changes and SIRS without clear infectious source.  Pseudoaneurysm on CT, IR consulted and performed coil embolization during hospital stay.   Assessment & Plan:   Newly confirmed metastatic breast cancer Per Oncology -with mets to the liver Rule out carcinomatosis given ascites awaiting paracentesis  Intractable abdominal pain, ascites, POA  SAAG elevated - consistent with portal HTN Spironolactone and furosemide dosing increased, follow creatinine and potassium  Increase daily potassium supplementation given hypokalemia   SIRS; without source, POA Sepsis ruled out Patient presented with tachycardia, tachypnea,hypotension, lactic acidosis, leukocytosis which may be reactive in the setting of metastatic disease, and symptomatic ascites. -Discontinue antibiotics, follow cultures/symptoms -Remains afebrile, leukocytosis stable   Acute metabolic encephalopathy, multifactorial  Hyperammonemia: -Improving with supportive care and lactulose, more awake alert oriented per family at bedside today -Continue lactulose, goal 3 bowel movement per day   Pseudoaneurysm, POA: Patient was evaluated by IR - coil embolization of the bleeding artery successful.  Hypercalcemia, chronic: Patient is following with oncology and getting Zometa  for hypercalcemia.   Serum calcium downtrending appropriately Continue to monitor serum calcium.   Essential hypertension: Resume blood pressure medications once blood pressure improves.   GERD: Continue pantoprazole.  DVT prophylaxis: SCDs only given above Code Status: Full Family Communication: At bedside  Status is: Inpatient  Dispo: The patient is from: Home              Anticipated d/c is to: Home              Anticipated d/c date is: 24-48+ hours              Patient currently not medically stable for discharge  Consultants:  Interventional radiology, oncology  Procedures:  Coil embolization Paracentesis  Antimicrobials:  Discontinued  Subjective: No acute issues or events overnight, tolerating diet well, abdominal distention somewhat worsening today but denies nausea vomiting diarrhea constipation headache fevers chills or chest pain  Objective: Vitals:   07/25/21 1712 07/25/21 2137 07/26/21 0117 07/26/21 0710  BP: (!) 103/55 108/63 93/74 112/69  Pulse: 84 86 84 92  Resp: 18 18 16 18   Temp: 98.7 F (37.1 C) 99 F (37.2 C) 98.5 F (36.9 C) 98.1 F (36.7 C)  TempSrc: Oral Oral Oral Oral  SpO2: 98% 99% 94% 97%  Weight:      Height:       No intake or output data in the 24 hours ending 07/26/21 1036  Filed Weights   07/22/21 1431  Weight: 85.7 kg    Examination:  General:  Pleasantly resting in bed, No acute distress. HEENT:  Normocephalic atraumatic.  Sclerae nonicteric, noninjected.  Extraocular movements intact bilaterally. Neck:  Without mass or deformity.  Trachea is midline. Lungs:  Clear to auscultate bilaterally without rhonchi, wheeze, or rales. Heart:  Regular, rate and rhythm.  Without murmurs, rubs, or gallops. Abdomen: Soft, minimally distended,  nontympanic, with tenderness to palpation Extremities: Without cyanosis, clubbing, edema, or obvious deformity. Vascular:  Dorsalis pedis and posterior tibial pulses palpable  bilaterally. Skin:  Warm and dry, no erythema, no ulcerations.   Data Reviewed: I have personally reviewed following labs and imaging studies  CBC: Recent Labs  Lab 07/22/21 1117 07/22/21 1519 07/23/21 0256 07/23/21 0904 07/24/21 0239 07/25/21 0244  WBC 20.0* 21.5* 24.6* 26.6* 25.7* 23.7*  NEUTROABS 13.5* 14.6*  --   --   --   --   HGB 10.5* 10.2* 8.4* 8.1* 7.5* 7.6*  HCT 30.8* 30.3* 26.1* 25.9* 23.5* 24.3*  MCV 87.3 89.9 92.6 97.4 92.9 95.7  PLT 171 173 173 181 198 702    Basic Metabolic Panel: Recent Labs  Lab 07/22/21 1117 07/23/21 0256 07/24/21 0239 07/25/21 0244 07/26/21 0741  NA 130* 129* 131* 130* 126*  K 3.4* 3.6 3.2* 3.3* 2.8*  CL 98 99 102 99 97*  CO2 22 19* 22 23 21*  GLUCOSE 120* 139* 95 85 100*  BUN 19 25* 27* 24* 18  CREATININE 0.79 0.91 0.55 0.65 0.55  CALCIUM 11.9* 10.9* 10.5* 10.8* 11.2*  MG  --  2.0 2.1  --   --   PHOS  --  2.2* 1.8*  --   --     GFR: Estimated Creatinine Clearance: 80.5 mL/min (by C-G formula based on SCr of 0.55 mg/dL). Liver Function Tests: Recent Labs  Lab 07/22/21 1117 07/23/21 0256 07/24/21 1231  AST 120* 126*  --   ALT 54* 56*  --   ALKPHOS 110 91  --   BILITOT 3.3* 3.4*  --   PROT 7.5 6.9  --   ALBUMIN 3.2* 2.7* 2.9*    No results for input(s): LIPASE, AMYLASE in the last 168 hours. Recent Labs  Lab 07/22/21 1447 07/23/21 1218  AMMONIA 76* 45*    Coagulation Profile: Recent Labs  Lab 07/19/21 1051 07/23/21 0256  INR 1.2 1.3*    Cardiac Enzymes: No results for input(s): CKTOTAL, CKMB, CKMBINDEX, TROPONINI in the last 168 hours. BNP (last 3 results) No results for input(s): PROBNP in the last 8760 hours. HbA1C: No results for input(s): HGBA1C in the last 72 hours. CBG: No results for input(s): GLUCAP in the last 168 hours. Lipid Profile: No results for input(s): CHOL, HDL, LDLCALC, TRIG, CHOLHDL, LDLDIRECT in the last 72 hours. Thyroid Function Tests: No results for input(s): TSH, T4TOTAL,  FREET4, T3FREE, THYROIDAB in the last 72 hours. Anemia Panel: No results for input(s): VITAMINB12, FOLATE, FERRITIN, TIBC, IRON, RETICCTPCT in the last 72 hours. Sepsis Labs: Recent Labs  Lab 07/22/21 1647 07/22/21 1803 07/23/21 0256 07/23/21 0904  PROCALCITON  --   --  0.23  --   LATICACIDVEN 4.7* 5.6*  --  4.1*     Recent Results (from the past 240 hour(s))  Resp Panel by RT-PCR (Flu A&B, Covid) Nasopharyngeal Swab     Status: None   Collection Time: 07/22/21  5:04 PM   Specimen: Nasopharyngeal Swab; Nasopharyngeal(NP) swabs in vial transport medium  Result Value Ref Range Status   SARS Coronavirus 2 by RT PCR NEGATIVE NEGATIVE Final    Comment: (NOTE) SARS-CoV-2 target nucleic acids are NOT DETECTED.  The SARS-CoV-2 RNA is generally detectable in upper respiratory specimens during the acute phase of infection. The lowest concentration of SARS-CoV-2 viral copies this assay can detect is 138 copies/mL. A negative result does not preclude SARS-Cov-2 infection and should not be used as the sole basis for  treatment or other patient management decisions. A negative result may occur with  improper specimen collection/handling, submission of specimen other than nasopharyngeal swab, presence of viral mutation(s) within the areas targeted by this assay, and inadequate number of viral copies(<138 copies/mL). A negative result must be combined with clinical observations, patient history, and epidemiological information. The expected result is Negative.  Fact Sheet for Patients:  EntrepreneurPulse.com.au  Fact Sheet for Healthcare Providers:  IncredibleEmployment.be  This test is no t yet approved or cleared by the [place] FDA and  has been authorized for detection and/or diagnosis of SARS-CoV-2 by FDA under an Emergency Use Authorization (EUA). This EUA will remain  in effect (meaning this test can be used) for the duration of  the COVID-19 declaration under Section 564(b)(1) of the Act, 21 U.S.C.section 360bbb-3(b)(1), unless the authorization is terminated  or revoked sooner.       Influenza A by PCR NEGATIVE NEGATIVE Final   Influenza B by PCR NEGATIVE NEGATIVE Final    Comment: (NOTE) The Xpert Xpress SARS-CoV-2/FLU/RSV plus assay is intended as an aid in the diagnosis of influenza from Nasopharyngeal swab specimens and should not be used as a sole basis for treatment. Nasal washings and aspirates are unacceptable for Xpert Xpress SARS-CoV-2/FLU/RSV testing.  Fact Sheet for Patients: EntrepreneurPulse.com.au  Fact Sheet for Healthcare Providers: IncredibleEmployment.be  This test is not yet approved or cleared by the [place] FDA and has been authorized for detection and/or diagnosis of SARS-CoV-2 by FDA under an Emergency Use Authorization (EUA). This EUA will remain in effect (meaning this test can be used) for the duration of the COVID-19 declaration under Section 564(b)(1) of the Act, 21 U.S.C. section 360bbb-3(b)(1), unless the authorization is terminated or revoked.  Performed at [facility], [place] [address], [place], [place] [address]   Urine Culture     Status: None   Collection Time: 07/23/21 12:30 AM   Specimen: Urine, Clean Catch  Result Value Ref Range Status   Specimen Description   Final    URINE, CLEAN CATCH Performed at [facility], [place] [address] [place], [place] [address]    Special Requests   Final    NONE Performed at [facility], [place] [address], [place], [place] [address]    Culture   Final    NO GROWTH Performed at [facility], [place] [address] [place], [place] [address]    Report Status 07/24/2021 FINAL  Final  Body fluid culture w Gram Stain     Status: None (Preliminary result)   Collection Time: 07/24/21  2:26 PM   Specimen: PATH Cytology Peritoneal fluid   Result Value Ref Range Status   Specimen Description   Final    PERITONEAL Performed at [place] [address] [place],  [address]    Special Requests   Final    NONE Performed at [facility], [place] [address], [place], [place] [address]    Gram Stain   Final    FEW WBC PRESENT,BOTH PMN AND MONONUCLEAR NO ORGANISMS SEEN    Culture   Final    NO GROWTH 2 DAYS Performed at [facility], [place] [address] [place],  [address]    Report Status PENDING  Incomplete          Radiology Studies: US Paracentesis  Result Date: 07/24/2021 INDICATION: Metastatic breast cancer to the liver. Ascites. Recent hemorrhage following percutaneous liver biopsy requiring embolization.  Request for diagnostic and therapeutic paracentesis EXAM: ULTRASOUND GUIDED RIGHT LOWER QUADRANT PARACENTESIS MEDICATIONS: 1% plain lidocaine, 5 mL COMPLICATIONS: None immediate. PROCEDURE: Informed written consent was obtained from the patient after a discussion of the risks, benefits and alternatives to treatment. A timeout was performed prior to the initiation of the procedure. Initial ultrasound scanning demonstrates a large amount of ascites within the right lower abdominal quadrant. The right lower abdomen was prepped and draped in the usual sterile fashion. 1% lidocaine was used for local anesthesia. Following this, a 19 gauge, 7-cm, Yueh catheter was introduced. An ultrasound image was saved for documentation purposes. The paracentesis was performed. The catheter was removed and a dressing was applied. The patient tolerated the procedure well without immediate post procedural complication. FINDINGS: A total of approximately 4.8 L of dark bloody fluid was removed. Samples were sent to the laboratory as requested by the clinical team. IMPRESSION: Successful ultrasound-guided paracentesis yielding 4.8 liters of bloody peritoneal fluid. Read by: Ascencion Dike PA-C  Electronically Signed   By: Corrie Mckusick D.O.   On: 07/24/2021 16:09    Scheduled Meds:  atenolol  50 mg Oral Daily   And   hydrochlorothiazide  25 mg Oral Daily   Chlorhexidine Gluconate Cloth  6 each Topical Daily   docusate sodium  100 mg Oral BID   furosemide  20 mg Oral BID   lactulose  20 g Oral TID   mouth rinse  15 mL Mouth Rinse BID   melatonin  3 mg Oral Once   montelukast  10 mg Oral QHS   omeprazole  20 mg Oral Daily   spironolactone  12.5 mg Oral Daily   Continuous Infusions:     LOS: 4 days   Time spent: 35min  Scheryl Sanborn C Richetta Cubillos, DO [facility]  If 7PM-7AM, please contact night-coverage [email or web address]  07/26/2021, 10:36 AM

## 2021-07-26 NOTE — Progress Notes (Signed)
°   07/26/21 1254  Mobility  Activity Ambulated in hall  Level of Assistance Contact guard assist, steadying assist  Assistive Device Other (Comment) (handheld)  Distance Ambulated (ft) 250 ft  Mobility Ambulated with assistance in hallway  Mobility Response Tolerated well  Mobility performed by Mobility specialist  $Mobility charge 1 Mobility   Pt agreeable to mobilize this morning. Ambulated about 266ft in hall with handheld assist, tolerated well. She stated she was not feeling well this morning, secondary to not getting enough sleep last night. Pt noted she was feeling weak and tired during the session. Got pt back to bed with call bell at side. RN notified of session.  [place] Specialist Acute Rehab Services Office: [phone]

## 2021-07-27 DIAGNOSIS — A419 Sepsis, unspecified organism: Secondary | ICD-10-CM | POA: Diagnosis not present

## 2021-07-27 DIAGNOSIS — R6520 Severe sepsis without septic shock: Secondary | ICD-10-CM | POA: Diagnosis not present

## 2021-07-27 LAB — BASIC METABOLIC PANEL
Anion gap: 7 (ref 5–15)
BUN: 14 mg/dL (ref 8–23)
CO2: 21 mmol/L — ABNORMAL LOW (ref 22–32)
Calcium: 11 mg/dL — ABNORMAL HIGH (ref 8.9–10.3)
Chloride: 101 mmol/L (ref 98–111)
Creatinine, Ser: 0.59 mg/dL (ref 0.44–1.00)
GFR, Estimated: >60 mL/min (ref >60)
Glucose, Bld: 71 mg/dL (ref 70–99)
Potassium: 3.1 mmol/L — ABNORMAL LOW (ref 3.5–5.1)
Sodium: 129 mmol/L — ABNORMAL LOW (ref 135–145)

## 2021-07-27 LAB — CBC
HCT: 26.1 % — ABNORMAL LOW (ref 36.0–46.0)
Hemoglobin: 8.3 g/dL — ABNORMAL LOW (ref 12.0–15.0)
MCH: 29.5 pg (ref 26.0–34.0)
MCHC: 31.8 g/dL (ref 30.0–36.0)
MCV: 92.9 fL (ref 80.0–100.0)
Platelets: 231 10*3/uL (ref 150–400)
RBC: 2.81 MIL/uL — ABNORMAL LOW (ref 3.87–5.11)
RDW: 17.1 % — ABNORMAL HIGH (ref 11.5–15.5)
WBC: 17.9 10*3/uL — ABNORMAL HIGH (ref 4.0–10.5)
nRBC: 1 % — ABNORMAL HIGH (ref 0.0–0.2)

## 2021-07-27 MED ORDER — POTASSIUM CHLORIDE CRYS ER 20 MEQ PO TBCR: 20.0000 meq | EXTENDED_RELEASE_TABLET | Freq: Two times a day (BID) | ORAL | Status: DC

## 2021-07-27 MED ORDER — POTASSIUM CHLORIDE CRYS ER 20 MEQ PO TBCR
40.0000 meq | EXTENDED_RELEASE_TABLET | Freq: Two times a day (BID) | ORAL | Status: DC
  Administered 2021-07-27: 40 meq via ORAL
  Filled 2021-07-27: qty 2

## 2021-07-27 MED ORDER — HYDROCHLOROTHIAZIDE 25 MG PO TABS: 25.0000 mg | ORAL_TABLET | Freq: Every day | ORAL | 0 refills | Status: DC

## 2021-07-27 MED ORDER — SPIRONOLACTONE 25 MG PO TABS: 25.0000 mg | ORAL_TABLET | Freq: Every day | ORAL | 0 refills | Status: DC

## 2021-07-27 MED ORDER — FUROSEMIDE 40 MG PO TABS: 40.0000 mg | ORAL_TABLET | Freq: Two times a day (BID) | ORAL | 0 refills | Status: DC

## 2021-07-27 MED ORDER — LACTULOSE 10 GM/15ML PO SOLN: 20.0000 g | Freq: Three times a day (TID) | ORAL | 0 refills | Status: DC

## 2021-07-27 MED ORDER — ATENOLOL 50 MG PO TABS: 50.0000 mg | ORAL_TABLET | Freq: Every day | ORAL | 0 refills | Status: DC

## 2021-07-27 MED ORDER — POTASSIUM CHLORIDE CRYS ER 20 MEQ PO TBCR: 40.0000 meq | EXTENDED_RELEASE_TABLET | Freq: Two times a day (BID) | ORAL | 0 refills | Status: DC

## 2021-07-27 NOTE — Progress Notes (Signed)
Patient and family member was given discharge instructions, and all questions were answered. Patient was stable for discharge and was taken to the main exit by wheelchair. 

## 2021-07-27 NOTE — TOC Transition Note (Signed)
Transition of Care [facility]) - CM/SW Discharge Note   Patient Details  Name: Kynslei Art MRN: [id number] Date of Birth: 08/23/1960  [facility] CM/SW Contact:  Illene Regulus, LCSW Phone Number: 07/27/2021, 12:33 PM   Clinical Narrative:     TOC CSW spoke with pt and her sister Dana,inquired if pt received rolling walker. They stated pt did not receive walker yet. CSW sent referral to [facility] for walker to be delivered to pt's room prior to d/c .         Patient Goals and CMS Choice        Discharge Placement                       Discharge Plan and Services                                     Social Determinants of Health (SDOH) Interventions     Readmission Risk Interventions No flowsheet data found.

## 2021-07-27 NOTE — Progress Notes (Signed)
°   07/27/21 1400  Mobility  Activity Ambulated in hall  Level of Assistance Modified independent, requires aide device or extra time  Assistive Device Front wheel walker  Distance Ambulated (ft) 250 ft  Mobility Ambulated with assistance in hallway  Mobility Response Tolerated well  Mobility performed by Mobility specialist  $Mobility charge 1 Mobility   Pt agreeable to mobilize this afternoon. Requested to use the bathroom prior to session. Did this independently. Ambulated in hall about 213ft with RW, tolerated well. No complaints. Left pt in chair with sister present.   [place] Specialist Acute Rehab Services Office: [phone]

## 2021-07-28 LAB — BODY FLUID CULTURE W GRAM STAIN: Culture: NO GROWTH

## 2021-07-30 ENCOUNTER — Telehealth: Payer: Self-pay | Admitting: Genetic Counselor

## 2021-07-30 ENCOUNTER — Telehealth: Payer: Self-pay

## 2021-07-30 ENCOUNTER — Other Ambulatory Visit: Payer: Self-pay

## 2021-07-30 ENCOUNTER — Encounter: Payer: Self-pay | Admitting: Genetic Counselor

## 2021-07-30 DIAGNOSIS — Z170 Estrogen receptor positive status [ER+]: Secondary | ICD-10-CM

## 2021-07-30 DIAGNOSIS — C50412 Malignant neoplasm of upper-outer quadrant of left female breast: Secondary | ICD-10-CM

## 2021-07-30 DIAGNOSIS — Z1379 Encounter for other screening for genetic and chromosomal anomalies: Secondary | ICD-10-CM | POA: Insufficient documentation

## 2021-07-30 LAB — CYTOLOGY - NON PAP

## 2021-07-30 NOTE — Telephone Encounter (Signed)
Return call to [facility] with [employer] to verify reason patient was sent to ED - advised patient has had elevated calcium, slurred speech, confusion, and abdominal distension.  Kaitlyn confirmed info received and thanked [place]

## 2021-07-30 NOTE — Telephone Encounter (Signed)
Spoke with Christine Francis that we received a sample for genetic testing.  Discussed that she had negative hereditary cancer genetic testing in 2020.  Discussed that we can slightly update her testing to include RNA analysis; however, it is very rare to find a pathogenic variant here that would not have been detected by DNA analysis alone.  Undecided about whether she wants to add RNA analysis to testing.  Wants to consult med onc team.

## 2021-07-31 ENCOUNTER — Other Ambulatory Visit: Payer: Self-pay

## 2021-07-31 ENCOUNTER — Encounter: Payer: Self-pay | Admitting: *Deleted

## 2021-07-31 ENCOUNTER — Inpatient Hospital Stay: Payer: 59

## 2021-07-31 ENCOUNTER — Ambulatory Visit (HOSPITAL_COMMUNITY)
Admission: RE | Admit: 2021-07-31 | Discharge: 2021-07-31 | Disposition: A | Discharge status: Home / Self Care | Payer: 59 | Source: Ambulatory Visit | Attending: Adult Health | Admitting: Adult Health

## 2021-07-31 ENCOUNTER — Inpatient Hospital Stay: Payer: 59 | Admitting: Adult Health

## 2021-07-31 ENCOUNTER — Encounter: Payer: Self-pay | Admitting: Adult Health

## 2021-07-31 VITALS — BP 99/70 | HR 86 | Temp 98.8°F | Resp 18 | Ht 66.0 in | Wt 187.0 lb

## 2021-07-31 DIAGNOSIS — R180 Malignant ascites: Secondary | ICD-10-CM

## 2021-07-31 DIAGNOSIS — Z170 Estrogen receptor positive status [ER+]: Secondary | ICD-10-CM

## 2021-07-31 DIAGNOSIS — C50412 Malignant neoplasm of upper-outer quadrant of left female breast: Secondary | ICD-10-CM | POA: Diagnosis not present

## 2021-07-31 DIAGNOSIS — C787 Secondary malignant neoplasm of liver and intrahepatic bile duct: Secondary | ICD-10-CM | POA: Diagnosis not present

## 2021-07-31 DIAGNOSIS — H539 Unspecified visual disturbance: Secondary | ICD-10-CM

## 2021-07-31 DIAGNOSIS — R188 Other ascites: Secondary | ICD-10-CM | POA: Insufficient documentation

## 2021-07-31 LAB — CMP (CANCER CENTER ONLY)
ALT: 47 U/L — ABNORMAL HIGH (ref 0–44)
AST: 126 U/L — ABNORMAL HIGH (ref 15–41)
Albumin: 3 g/dL — ABNORMAL LOW (ref 3.5–5.0)
Alkaline Phosphatase: 141 U/L — ABNORMAL HIGH (ref 38–126)
Anion gap: 7 (ref 5–15)
BUN: 13 mg/dL (ref 8–23)
CO2: 26 mmol/L (ref 22–32)
Calcium: 13.7 mg/dL (ref 8.9–10.3)
Chloride: 95 mmol/L — ABNORMAL LOW (ref 98–111)
Creatinine: 0.73 mg/dL (ref 0.44–1.00)
GFR, Estimated: >60 mL/min (ref >60)
Glucose, Bld: 94 mg/dL (ref 70–99)
Potassium: 3.4 mmol/L — ABNORMAL LOW (ref 3.5–5.1)
Sodium: 128 mmol/L — ABNORMAL LOW (ref 135–145)
Total Bilirubin: 2.7 mg/dL — ABNORMAL HIGH (ref 0.3–1.2)
Total Protein: 7.2 g/dL (ref 6.5–8.1)

## 2021-07-31 LAB — CBC WITH DIFFERENTIAL (CANCER CENTER ONLY)
Abs Immature Granulocytes: 0.08 10*3/uL — ABNORMAL HIGH (ref 0.00–0.07)
Basophils Absolute: 0.2 10*3/uL — ABNORMAL HIGH (ref 0.0–0.1)
Basophils Relative: 1 %
Eosinophils Absolute: 0.3 10*3/uL (ref 0.0–0.5)
Eosinophils Relative: 2 %
HCT: 28.4 % — ABNORMAL LOW (ref 36.0–46.0)
Hemoglobin: 9.2 g/dL — ABNORMAL LOW (ref 12.0–15.0)
Immature Granulocytes: 0 %
Lymphocytes Relative: 27 %
Lymphs Abs: 4.9 10*3/uL — ABNORMAL HIGH (ref 0.7–4.0)
MCH: 28.8 pg (ref 26.0–34.0)
MCHC: 32.4 g/dL (ref 30.0–36.0)
MCV: 89 fL (ref 80.0–100.0)
Monocytes Absolute: 1.8 10*3/uL — ABNORMAL HIGH (ref 0.1–1.0)
Monocytes Relative: 10 %
Neutro Abs: 10.8 10*3/uL — ABNORMAL HIGH (ref 1.7–7.7)
Neutrophils Relative %: 60 %
Platelet Count: 292 10*3/uL (ref 150–400)
RBC: 3.19 MIL/uL — ABNORMAL LOW (ref 3.87–5.11)
RDW: 16.6 % — ABNORMAL HIGH (ref 11.5–15.5)
WBC Count: 18 10*3/uL — ABNORMAL HIGH (ref 4.0–10.5)
nRBC: 0.2 % (ref 0.0–0.2)

## 2021-07-31 LAB — SURGICAL PATHOLOGY

## 2021-07-31 MED ORDER — HYDROCHLOROTHIAZIDE 25 MG PO TABS: 25.0000 mg | ORAL_TABLET | Freq: Every day | ORAL | 1 refills | Status: DC

## 2021-07-31 MED ORDER — LORAZEPAM 2 MG/ML IJ SOLN
0.5000 mg | Freq: Once | INTRAMUSCULAR | Status: AC
  Administered 2021-07-31: 0.5 mg via INTRAVENOUS
  Filled 2021-07-31: qty 1

## 2021-07-31 MED ORDER — LACTULOSE 10 GM/15ML PO SOLN: 20.0000 g | Freq: Three times a day (TID) | ORAL | 0 refills | Status: DC

## 2021-07-31 MED ORDER — SPIRONOLACTONE 25 MG PO TABS: 25.0000 mg | ORAL_TABLET | Freq: Every day | ORAL | 1 refills | Status: DC

## 2021-07-31 MED ORDER — GADOBUTROL 1 MMOL/ML IV SOLN
9.0000 mL | Freq: Once | INTRAVENOUS | Status: AC | PRN
  Administered 2021-07-31: 9 mL via INTRAVENOUS

## 2021-07-31 MED ORDER — SODIUM CHLORIDE 0.9 % IV SOLN: Freq: Once | INTRAVENOUS | Status: AC

## 2021-07-31 MED ORDER — ZOLEDRONIC ACID 4 MG/100ML IV SOLN
4.0000 mg | Freq: Once | INTRAVENOUS | Status: AC
  Administered 2021-07-31: 4 mg via INTRAVENOUS
  Filled 2021-07-31: qty 100

## 2021-07-31 MED ORDER — FULVESTRANT 250 MG/5ML IM SOSY
500.0000 mg | PREFILLED_SYRINGE | Freq: Once | INTRAMUSCULAR | Status: AC
  Administered 2021-07-31: 500 mg via INTRAMUSCULAR
  Filled 2021-07-31: qty 10

## 2021-07-31 MED ORDER — FUROSEMIDE 40 MG PO TABS: 40.0000 mg | ORAL_TABLET | Freq: Two times a day (BID) | ORAL | 2 refills | Status: DC

## 2021-07-31 MED ORDER — POTASSIUM CHLORIDE CRYS ER 20 MEQ PO TBCR: 40.0000 meq | EXTENDED_RELEASE_TABLET | Freq: Two times a day (BID) | ORAL | 1 refills | Status: DC

## 2021-07-31 NOTE — Progress Notes (Signed)
[facility] Follow up:    Molli Posey, MD [address] [place] [address]   DIAGNOSIS:  Cancer Staging  Malignant neoplasm of upper-outer quadrant of left breast in female, estrogen receptor positive ([place]) Staging form: Breast, AJCC 8th Edition - Pathologic stage from 02/07/2019: Stage IA (pT2, pN0, cM0, G2, ER+, PR+, HER2-, Oncotype DX score: 24) - Signed by Gardenia Phlegm, NP on 03/02/2019 Stage prefix: Initial diagnosis Multigene prognostic tests performed: Oncotype DX Recurrence score range: Greater than or equal to 11 Histologic grading system: 3 grade system   SUMMARY OF ONCOLOGIC HISTORY: Oncology History  Malignant neoplasm of upper-outer quadrant of left breast in female, estrogen receptor positive ([place])  07/19/2014 Initial Biopsy   Rt.Breast Biopsy: LCIS   11/29/2014 Surgery   Right lumpectomy: LCIS with fibrocystic changes with usual ductal hyperplasia,PASH, 0.8 cm margins for LCIS   02/06/2015 -  Anti-estrogen oral therapy   tamoxifen 20 mg daily   12/08/2018 Relapse/Recurrence   Left breast 2 cm mass at 7 o clock position: ILC grade 2, ER 90%, PR 30%, Her 2 neg, Ki 67: 15% T1CN0 stage 1A   02/07/2019 Surgery   Bilateral mastectomies with reconstruction ([facility], [place]) Right breast: atypical lobular hyperplasia and no evidence of carcinoma in 1 lymph node. Left breast: invasive lobular carcinoma with LCIS, grade 2, 2.1cm, 2 lymph nodes negative for carcinoma, and invasive carcinoma broadly present at the anterior margin.    02/07/2019 Cancer Staging   Staging form: Breast, AJCC 8th Edition - Pathologic stage from 02/07/2019: Stage IA (pT2, pN0, cM0, G2, ER+, PR+, HER2-, Oncotype DX score: 24) - Signed by Gardenia Phlegm, NP on 03/02/2019    02/07/2019 Oncotype testing   24/10%; no benefit from chemo   02/2019 -  Anti-estrogen oral therapy   Anastrozole daily switched to letrozole 09/26/2019      CURRENT THERAPY: Fulvestrant, Zometa, Verzenio  INTERVAL HISTORY: Christine Francis 61 y.o. female returns for evaluation after her hospitalization from January 9 through January 14.  Recall that she was admitted to the ICU after undergoing liver biopsy and developing a postprocedural bleed that led to elevated ammonia levels, LFTs, and increased confusion.  This was managed by [facility] embolization during her hospitalization.  Due to her liver metastases she was noted to have portal hypertension and required paracentesis.  During her hospitalization they did take out 4.8 L of fluid off her abdomen.  She was instructed to undergo fluid restriction of 1.5 L daily.  She was instructed to continue spironolactone and Lasix in addition to her home medications.  She was started on potassium 40 mg twice daily for hypokalemia.  Biopsy results were consistent with metastatic breast cancer estrogen positive, progesterone negative, HER2 negative.  She met with Dr. Lindi Adie during her hospitalization and was plan to start fulvestrant with a load given IM every 2 weeks initially followed by every 4 weeks.  She will also start abemaciclib 50 mg p.o. twice daily.  They will receive this medication and start on this tomorrow.  She is feeling slightly confused.  Her calcium is 13.7 today.  She has noticed some mild vision changes.  The started while she was in the hospital but she feels like they are getting worse.  Her sister is with her today who has been her main caregiver through this and notes that something is off with her sister she just cannot pinpoint it.  We discussed advanced directives today.  This  is an issue that the family has not yet addressed with Christine Francis.  They would like to receive paperwork about this.  Christine Francis has worsening abdominal distention and decreased appetite.  She denies any new pain.     Patient Active Problem List   Diagnosis Date Noted   Liver metastases ([place]) 07/31/2021   Ascites  07/31/2021   Genetic testing 07/30/2021   Severe sepsis with acute organ dysfunction ([place]) 07/22/2021   Hypercalcemia 06/14/2021   S/P bilateral mastectomy 02/07/2019   Pain in right ankle and joints of right foot 12/23/2017   Chronic pain of right knee 12/23/2017   Anxiety 03/27/2015   Malignant neoplasm of upper-outer quadrant of left breast in female, estrogen receptor positive ([place]) 09/04/2014   Fibrocystic breast changes 07/04/2011   Atypical hyperplasia of breast excised R Breast may 2011 bc pills stopped 07/04/2011   Personal history of colonic adenomas 11/17/2005    is allergic to ampicillin, dilaudid [hydromorphone hcl], erythromycin, penicillins, sulfa drugs cross reactors, and declomycin [demeclocycline].  MEDICAL HISTORY: Past Medical History:  Diagnosis Date   Allergy    allergy shots weekly   Anxiety    no meds currently   Asthma    Breast cancer ([place])    left - LCIS - bilat mastec   Cancer ([place])    skin- basil cancer face, chest and left leg--   GERD (gastroesophageal reflux disease)    Hyperlipidemia    diet controlled, no meds   Hypertension    IBS (irritable bowel syndrome)    no problems currently   Personal history of colonic adenomas 11/17/2005   11/17/2005 - 3 adenomas - one  A TV adenoma was 12 mm (max)    SURGICAL HISTORY: Past Surgical History:  Procedure Laterality Date   BIOPSY BREAST  2000   right; benign   BREAST BIOPSY  2012   right breast; pre cancerous   BREAST LUMPECTOMY Right 04/1999, 11/2009   BREAST LUMPECTOMY WITH RADIOACTIVE SEED LOCALIZATION Right 11/29/2014   Procedure: BREAST LUMPECTOMY WITH RADIOACTIVE SEED LOCALIZATION;  Surgeon: Donnie Mesa, MD;  Location: [facility];  Service: General;  Laterality: Right;   BREAST RECONSTRUCTION WITH PLACEMENT OF TISSUE EXPANDER AND ALLODERM Bilateral 02/07/2019   Procedure: BILATERAL BREAST RECONSTRUCTION WITH PLACEMENT OF TISSUE EXPANDER AND ALLODERM;  Surgeon: [name], MD;  Location: [facility];  Service: Plastics;  Laterality: Bilateral;   BREAST RECONSTRUCTION WITH PLACEMENT OF TISSUE EXPANDER AND ALLODERM Left 05/24/2019   Procedure: BREAST RECONSTRUCTION WITH PLACEMENT OF TISSUE EXPANDER AND ALLODERM;  Surgeon: [name], MD;  Location: [facility];  Service: Plastics;  Laterality: Left;   BUNIONECTOMY  1986   bilateral   cervical cryosurgery  12/2013   COLONOSCOPY     x 2   DIAGNOSTIC LAPAROSCOPY     IR ANGIOGRAM SELECTIVE EACH ADDITIONAL VESSEL  07/22/2021   IR ANGIOGRAM SELECTIVE EACH ADDITIONAL VESSEL  07/22/2021   IR ANGIOGRAM SELECTIVE EACH ADDITIONAL VESSEL  07/22/2021   IR ANGIOGRAM VISCERAL SELECTIVE  07/22/2021   IR EMBO ART  VEN HEMORR LYMPH EXTRAV  INC GUIDE ROADMAPPING  07/22/2021   IR US GUIDE VASC ACCESS RIGHT  07/22/2021   laproscopy  1998   LIPOSUCTION Bilateral 01/31/2020   Procedure: LIPOSUCTION BILATERAL LATERAL CHEST WALL;  Surgeon: [name], MD;  Location: [facility];  Service: Plastics;  Laterality: Bilateral;   MASTECTOMY W/ SENTINEL NODE BIOPSY Bilateral 02/07/2019   Procedure: BILATERAL MASTECTOMIES WITH LEFT SENTINEL LYMPH NODE BIOPSY;  Surgeon: Donnie Mesa, MD;  Location: [facility];  Service: General;  Laterality: Bilateral;  PEC BLOCK   PARATHYROIDECTOMY  2005   POLYPECTOMY     RE-EXCISION OF BREAST LUMPECTOMY Left 02/24/2019   Procedure: RE-EXCISION LEFT MASTECTOMY- LEFT DERMAL FLAP;  Surgeon: Donnie Mesa, MD;  Location: [facility];  Service: General;  Laterality: Left;   REMOVAL OF BILATERAL TISSUE EXPANDERS WITH PLACEMENT OF BILATERAL BREAST IMPLANTS Bilateral 01/31/2020   Procedure: REMOVAL OF BILATERAL TISSUE EXPANDERS WITH PLACEMENT OF BILATERAL SILICONE BREAST IMPLANTS;  Surgeon: [name], MD;  Location: [facility];  Service: Plastics;  Laterality: Bilateral;   REMOVAL OF TISSUE EXPANDER AND PLACEMENT OF IMPLANT Left 02/24/2019    Procedure: REMOVAL OF LEFT CHEST TISSUE EXPANDER WITH ALLODERM;  Surgeon: [name], MD;  Location: [facility];  Service: Plastics;  Laterality: Left;   TISSUE EXPANDER FILLING Right 02/24/2019   Procedure: RIGHT CHEST TISSUE EXPANDER FILLING;  Surgeon: [name], MD;  Location: [facility];  Service: Plastics;  Laterality: Right;  (457m 0.9% Normal [place])   uterine bx     thickening of uterus was the reason for havin bx   WISDOM TOOTH EXTRACTION      SOCIAL HISTORY: Social History   Socioeconomic History   Marital status: Single    Spouse name: Not on file   Number of children: Not on file   Years of education: Not on file   Highest education level: Not on file  Occupational History   Occupation: PProbation officer [employer]   Employer: [employer]  Tobacco Use   Smoking status: Never   Smokeless tobacco: Never  Vaping Use   Vaping Use: Never used  Substance and Sexual Activity   Alcohol use: Yes    Alcohol/week: 1.0 standard drink    Types: 1 Glasses of wine per week    Comment: social   Drug use: No   Sexual activity: Yes    Birth control/protection: Post-menopausal  Other Topics Concern   Not on file  Social History Narrative   Not on file   Social Determinants of Health   Financial Resource Strain: Not on file  Food Insecurity: Not on file  Transportation Needs: Not on file  Physical Activity: Not on file  Stress: Not on file  Social Connections: Not on file  Intimate Partner Violence: Not on file    FAMILY HISTORY: Family History  Problem Relation Age of Onset   Hypertension Father    Cancer Father        melanoma/skin cancer   Colon polyps Father    Colon cancer Neg Hx    Rectal cancer Neg Hx    Stomach cancer Neg Hx    Esophageal cancer Neg Hx     Review of Systems  Constitutional:  Positive for appetite change and fatigue. Negative for chills, fever and unexpected weight change.  HENT:    Negative for hearing loss, lump/mass, mouth sores and trouble swallowing.   Eyes:  Negative for eye problems and icterus.       Positive for vision change.  Respiratory:  Negative for chest tightness, cough and shortness of breath.   Cardiovascular:  Negative for chest pain, leg swelling and palpitations.  Gastrointestinal:  Positive for abdominal distention. Negative for abdominal pain, constipation, diarrhea, nausea and vomiting.  Endocrine: Negative for hot flashes.  Genitourinary:  Negative for difficulty urinating.   Musculoskeletal:  Negative for arthralgias.  Skin:  Negative for itching and rash.  Neurological:  Negative for dizziness, extremity weakness, headaches and numbness.  Hematological:  Negative for adenopathy. Does not bruise/bleed easily.  Psychiatric/Behavioral:  Negative for depression. The patient is not nervous/anxious.      PHYSICAL EXAMINATION  ECOG PERFORMANCE STATUS: 2 - Symptomatic, <50% confined to bed  Vitals:   07/31/21 0856  BP: 99/70  Pulse: 86  Resp: 18  Temp: 98.8 F (37.1 C)  SpO2: 98%    Physical Exam Constitutional:      General: She is not in acute distress.    Appearance: Normal appearance. She is not toxic-appearing.  HENT:     Head: Normocephalic and atraumatic.     Mouth/Throat:     Mouth: Mucous membranes are moist.     Pharynx: No oropharyngeal exudate.  Eyes:     General: No scleral icterus.    Pupils: Pupils are equal, round, and reactive to light.  Cardiovascular:     Rate and Rhythm: Normal rate and regular rhythm.     Pulses: Normal pulses.     Heart sounds: Normal heart sounds.  Pulmonary:     Effort: Pulmonary effort is normal.     Breath sounds: Normal breath sounds.  Abdominal:     General: There is distension.     Palpations: Abdomen is soft.     Tenderness: There is no abdominal tenderness. There is no guarding or rebound.  Musculoskeletal:        General: No swelling.     Cervical back: Neck supple.   Lymphadenopathy:     Cervical: No cervical adenopathy.  Skin:    General: Skin is warm and dry.     Findings: No rash.  Neurological:     General: No focal deficit present.     Mental Status: She is alert and oriented to person, place, and time.  Psychiatric:     Comments: She is slightly confused in conversation.  Although she is alert and oriented.    LABORATORY DATA:  CBC    Component Value Date/Time   WBC 18.0 (H) 07/31/2021 0837   WBC 17.9 (H) 07/27/2021 0503   RBC 3.19 (L) 07/31/2021 0837   HGB 9.2 (L) 07/31/2021 0837   HGB 13.8 03/27/2015 0937   HCT 28.4 (L) 07/31/2021 0837   HCT 41.0 03/27/2015 0937   PLT 292 07/31/2021 0837   PLT 214 03/27/2015 0937   MCV 89.0 07/31/2021 0837   MCV 85.6 03/27/2015 0937   MCH 28.8 07/31/2021 0837   MCHC 32.4 07/31/2021 0837   RDW 16.6 (H) 07/31/2021 0837   RDW 13.4 03/27/2015 0937   LYMPHSABS 4.9 (H) 07/31/2021 0837   LYMPHSABS 2.0 03/27/2015 0937   MONOABS 1.8 (H) 07/31/2021 0837   MONOABS 0.4 03/27/2015 0937   EOSABS 0.3 07/31/2021 0837   EOSABS 0.2 03/27/2015 0937   BASOSABS 0.2 (H) 07/31/2021 0837   BASOSABS 0.1 03/27/2015 0937    CMP     Component Value Date/Time   NA 128 (L) 07/31/2021 0837   NA 141 03/27/2015 0937   K 3.4 (L) 07/31/2021 0837   K 3.4 (L) 03/27/2015 0937   CL 95 (L) 07/31/2021 0837   CO2 26 07/31/2021 0837   CO2 29 03/27/2015 0937   GLUCOSE 94 07/31/2021 0837   GLUCOSE 137 03/27/2015 0937   BUN 13 07/31/2021 0837   BUN 16.2 03/27/2015 0937   CREATININE 0.73 07/31/2021 0837   CREATININE 0.8 03/27/2015 0937   CALCIUM 13.7 (HH) 07/31/2021  6010   CALCIUM 12.0 (H) 06/28/2021 1346   CALCIUM 9.6 03/27/2015 0937   PROT 7.2 07/31/2021 0837   PROT 7.2 03/27/2015 0937   ALBUMIN 3.0 (L) 07/31/2021 0837   ALBUMIN 3.9 03/27/2015 0937   AST 126 (H) 07/31/2021 0837   AST 19 03/27/2015 0937   ALT 47 (H) 07/31/2021 0837   ALT 25 03/27/2015 0937   ALKPHOS 141 (H) 07/31/2021 0837   ALKPHOS 50  03/27/2015 0937   BILITOT 2.7 (H) 07/31/2021 0837   BILITOT 0.73 03/27/2015 0937   GFRNONAA >60 07/31/2021 0837   GFRAA >60 01/27/2020 1130     ASSESSMENT and THERAPY PLAN:   Malignant neoplasm of upper-outer quadrant of left breast in female, estrogen receptor positive ([place]) Right lumpectomy 11/29/2014: LCIS with fibrocystic changes with usual ductal hyperplasia,PASH, 0.8 cm margins for LCIS, took Tamoxifen from 02/07/15-12/24/18   12/08/18: Left breast 2 cm mass at 7 o clock position: ILC grade 2, ER 90%, PR 30%, Her 2 neg, Ki 67: 15% T1CN0 stage 1A   Anxiety: on Xanax   Treatment Plan:  1. Bilateral mastectomies (patient preference): 02/07/2019 Bilateral mastectomies with reconstruction ([place], [place]) Right breast: atypical lobular hyperplasia and no evidence of carcinoma in 1 lymph node. Left breast: invasive lobular carcinoma with LCIS, grade 2, 2.1cm, 2 lymph nodes negative for carcinoma, and invasive carcinoma broadly present at the anterior margin (margin cleared 02/24/2019).  Stage IIa 2. Oncotype Dx: Score 24, distant recurrence at 9 years 10% (did not require chemo) 3. Adj Anastrozole started 03/01/2019 changed to letrozole 09/26/2019 discontinued with disease progression  CT CAP 06/12/2021: (Performed by her PCP for elevated liver enzymes): Enlarged cirrhotic appearing liver with portal venous collaterals and esophageal varices.  Thickened endometrium worrisome for endometrial cancer.  Severe gastritis.  Borderline enlarged obturator lymph nodes  06/26/2021: Bone scan small foci of mild increased activity in frontal bones differential is hyperostosis versus metastases.  No other findings of metastatic disease. 06/28/2021: CT parathyroids is being done today 06/28/2021.  CT parathyroid: 06/28/2021: 9 mm soft tissue nodule possible parathyroid adenoma, 6 mm left lobe of the thyroid nodule PET CT scan 07/05/2021: Diffuse hepatic hypermetabolism concerning for liver metastases  versus hepatocellular carcinoma, hypermetabolic left axillary lymph node suspicious for metastatic disease, heterogeneous bone marrow activity nonspecific, portal vein hypertension  Liver biopsy: Consistent with metastatic breast cancer estrogen positive, progesterone negative, HER2 negative. Hospital admission after liver biopsy, discharged on July 27, 2021  Recommendations: #1 fulvestrant given every 2 weeks x 3 doses then every 4 weeks thereafter. 2.  Abemaciclib 50 mg p.o. twice daily to start tomorrow 3.  Zometa for her hypercalcemia  _______________________________________________________________________________________________  #1 metastatic breast cancer to the liver: She will begin treatment today with fulvestrant.  She understands the risks and benefits and how this works and agreed to treatment.  We will do this every 2 weeks x 3 and after that it we will go to every 4 weeks.  We will also start abemaciclib twice daily beginning tomorrow.  #2 hypercalcemia: She is slightly confused today this is likely due to her hypercalcemia.  She will receive IV Zometa today.  We cannot give her fluids due to her fluid restriction  #3 vision change: I have ordered stat MRI of the brain with and without contrast to evaluate.  She is very claustrophobic therefore I ordered lorazepam 0.5 mg IV for her to receive prior to going over for her MRI.  #4 malignant ascites: I have placed orders for her  to undergo ultrasound-guided paracentesis for her comfort.  5.  Advanced directives: Christine Francis sister and I had a long discussion about this and I will bring her some paperwork.  I also placed a palliative care referral today.  We discussed that I am concerned about how ill her sister is and and uncertain how well she will do moving forward so it is important to get these documents completed and establish with palliative care to understand the role in helping with quality of life measures and navigating  symptoms while having metastatic breast cancer.  Christine Francis will return on Friday for labs.  We will see her again early next week for labs and follow-up. The above assessment and plan was reviewed in detail with Dr. Lindi Adie who agrees with it.  All questions were answered. The patient knows to call the clinic with any problems, questions or concerns. We can certainly see the patient much sooner if necessary.  Total encounter time: 60 minutes in face-to-face visit time, chart review, lab review, care coordination, order entry, discussion with nursing staff and physicians, and documentation of the encounter.  Wilber Bihari, NP 07/31/21 10:55 AM Medical Oncology and Hematology [facility] [place],  [address] Tel. [phone]    Fax. [phone]  *Total Encounter Time as defined by the Centers for Medicare and Medicaid Services includes, in addition to the face-to-face time of a patient visit (documented in the note above) non-face-to-face time: obtaining and reviewing outside history, ordering and reviewing medications, tests or procedures, care coordination (communications with other health care professionals or caregivers) and documentation in the medical record.

## 2021-07-31 NOTE — Assessment & Plan Note (Signed)
Right lumpectomy 11/29/2014: LCIS with fibrocystic changes with usual ductal hyperplasia,PASH, 0.8 cm margins for LCIS, took Tamoxifen from 02/07/15-12/24/18  12/08/18:Left breast 2 cm mass at 7 o clock position: ILC grade 2, ER 90%, PR 30%, Her 2 neg, Ki 67: 15% T1CN0 stage 1A  Anxiety:onXanax  Treatment Plan:  1. Bilateral mastectomies (patient preference): 7/27/2020Bilateral mastectomies with reconstruction ([facility], [place]) Right breast: atypical lobular hyperplasia and no evidence of carcinoma in 1 lymph node. Left breast: invasive lobular carcinoma with LCIS, grade 2, 2.1cm, 2 lymph nodes negative for carcinoma, and invasive carcinoma broadly present at the anterior margin(margin cleared 02/24/2019). Stage IIa 2. Oncotype Dx: Score 24, distant recurrence at 9 years 10% (did not require chemo) 3. Adj Anastrozolestarted 8/18/2020changed to letrozole 09/26/2019 discontinued with disease progression  CT CAP 06/12/2021: (Performed by her PCP for elevated liver enzymes): Enlarged cirrhotic appearing liver with portal venous collaterals and esophageal varices. Thickened endometrium worrisome for endometrial cancer. Severe gastritis. Borderline enlarged obturator lymph nodes  06/26/2021: Bone scan small foci of mild increased activity in frontal bones differential is hyperostosis versus metastases.  No other findings of metastatic disease. 06/28/2021: CT parathyroids is being done today 06/28/2021.  CT parathyroid: 06/28/2021: 9 mm soft tissue nodule possible parathyroid adenoma, 6 mm left lobe of the thyroid nodule PET CT scan 07/05/2021: Diffuse hepatic hypermetabolism concerning for liver metastases versus hepatocellular carcinoma, hypermetabolic left axillary lymph node suspicious for metastatic disease, heterogeneous bone marrow activity nonspecific, portal vein hypertension  Liver biopsy: Consistent with metastatic breast cancer estrogen positive, progesterone negative, HER2  negative. Hospital admission after liver biopsy, discharged on July 27, 2021  Recommendations: #1 fulvestrant given every 2 weeks x 3 doses then every 4 weeks thereafter. 2.  Abemaciclib 50 mg p.o. twice daily to start tomorrow 3.  Zometa for her hypercalcemia  _______________________________________________________________________________________________  #1 metastatic breast cancer to the liver: She will begin treatment today with fulvestrant.  She understands the risks and benefits and how this works and agreed to treatment.  We will do this every 2 weeks x 3 and after that it we will go to every 4 weeks.  We will also start abemaciclib twice daily beginning tomorrow.  #2 hypercalcemia: She is slightly confused today this is likely due to her hypercalcemia.  She will receive IV Zometa today.  We cannot give her fluids due to her fluid restriction  #3 vision change: I have ordered stat MRI of the brain with and without contrast to evaluate.  She is very claustrophobic therefore I ordered lorazepam 0.5 mg IV for her to receive prior to going over for her MRI.  #4 malignant ascites: I have placed orders for her to undergo ultrasound-guided paracentesis for her comfort.  5.  Advanced directives: Christine Francis sister and I had a long discussion about this and I will bring her some paperwork.  I also placed a palliative care referral today.  We discussed that I am concerned about how ill her sister is and and uncertain how well she will do moving forward so it is important to get these documents completed and establish with palliative care to understand the role in helping with quality of life measures and navigating symptoms while having metastatic breast cancer.  Christine Francis will return on Friday for labs.  We will see her again early next week for labs and follow-up.

## 2021-07-31 NOTE — Progress Notes (Signed)
Per Mendel Ryder, NP, leave PIV in for patient's 3:30 MRI appointment. IV site was noted to be clean, dry and intact upon leaving infusion room.

## 2021-07-31 NOTE — Patient Instructions (Addendum)
[facility]  Discharge Instructions: Thank you for choosing [facility] to provide your oncology and hematology care.   If you have a lab appointment with the [facility], please go directly to the [facility] and check in at the registration area.   Wear comfortable clothing and clothing appropriate for easy access to any Portacath or PICC line.   We strive to give you quality time with your provider. You may need to reschedule your appointment if you arrive late (15 or more minutes).  Arriving late affects you and other patients whose appointments are after yours.  Also, if you miss three or more appointments without notifying the office, you may be dismissed from the clinic at the providers discretion.      For prescription refill requests, have your pharmacy contact our office and allow 72 hours for refills to be completed.    Today you received the following: Zometa and Faslodex    To help prevent nausea and vomiting after your treatment, we encourage you to take your nausea medication as directed.  BELOW ARE SYMPTOMS THAT SHOULD BE REPORTED IMMEDIATELY: *FEVER GREATER THAN 100.4 F (38 C) OR HIGHER *CHILLS OR SWEATING *NAUSEA AND VOMITING THAT IS NOT CONTROLLED WITH YOUR NAUSEA MEDICATION *UNUSUAL SHORTNESS OF BREATH *UNUSUAL BRUISING OR BLEEDING *URINARY PROBLEMS (pain or burning when urinating, or frequent urination) *BOWEL PROBLEMS (unusual diarrhea, constipation, pain near the anus) TENDERNESS IN MOUTH AND THROAT WITH OR WITHOUT PRESENCE OF ULCERS (sore throat, sores in mouth, or a toothache) UNUSUAL RASH, SWELLING OR PAIN  UNUSUAL VAGINAL DISCHARGE OR ITCHING   Items with * indicate a potential emergency and should be followed up as soon as possible or go to the Emergency Department if any problems should occur.  Please show the CHEMOTHERAPY ALERT CARD or IMMUNOTHERAPY ALERT CARD at check-in to the Emergency Department and  triage nurse.  Should you have questions after your visit or need to cancel or reschedule your appointment, please contact [facility]  Dept: [phone]  and follow the prompts.  Office hours are 8:00 a.m. to 4:30 p.m. Monday - Friday. Please note that voicemails left after 4:00 p.m. may not be returned until the following business day.  We are closed weekends and major holidays. You have access to a nurse at all times for urgent questions. Please call the main number to the clinic Dept: [phone] and follow the prompts.   For any non-urgent questions, you may also contact your provider using MyChart. We now offer e-Visits for anyone 29 and older to request care online for non-urgent symptoms. For details visit [email or web address].   Also download the MyChart app! Go to the app store, search "MyChart", open the app, select [facility], and log in with your MyChart username and password.  Due to Covid, a mask is required upon entering the hospital/clinic. If you do not have a mask, one will be given to you upon arrival. For doctor visits, patients may have 1 support person aged 79 or older with them. For treatment visits, patients cannot have anyone with them due to current Covid guidelines and our immunocompromised population.

## 2021-07-31 NOTE — Progress Notes (Signed)
CRITICAL VALUE STICKER  CRITICAL VALUE: CA 13.7  Geraldo Docker, RN  DATE & TIME NOTIFIED: 07/31/21 at 0932  MD NOTIFIED: Wilber Bihari, NP   RESPONSE:  NP notified, verbalized understanding.  No orders received at this time.

## 2021-08-01 ENCOUNTER — Other Ambulatory Visit: Payer: Self-pay

## 2021-08-01 ENCOUNTER — Other Ambulatory Visit: Payer: Self-pay | Admitting: Hematology and Oncology

## 2021-08-01 ENCOUNTER — Ambulatory Visit
Admission: RE | Admit: 2021-08-01 | Discharge: 2021-08-01 | Disposition: A | Discharge status: Home / Self Care | Payer: 59 | Source: Ambulatory Visit | Attending: Radiation Oncology | Admitting: Radiation Oncology

## 2021-08-01 ENCOUNTER — Ambulatory Visit (HOSPITAL_COMMUNITY)
Admission: RE | Admit: 2021-08-01 | Discharge: 2021-08-01 | Disposition: A | Discharge status: Home / Self Care | Payer: 59 | Source: Ambulatory Visit | Attending: Adult Health | Admitting: Adult Health

## 2021-08-01 DIAGNOSIS — R180 Malignant ascites: Secondary | ICD-10-CM | POA: Insufficient documentation

## 2021-08-01 DIAGNOSIS — C787 Secondary malignant neoplasm of liver and intrahepatic bile duct: Secondary | ICD-10-CM | POA: Diagnosis not present

## 2021-08-01 DIAGNOSIS — Z170 Estrogen receptor positive status [ER+]: Secondary | ICD-10-CM | POA: Diagnosis not present

## 2021-08-01 DIAGNOSIS — C7949 Secondary malignant neoplasm of other parts of nervous system: Secondary | ICD-10-CM

## 2021-08-01 DIAGNOSIS — C50412 Malignant neoplasm of upper-outer quadrant of left female breast: Secondary | ICD-10-CM

## 2021-08-01 DIAGNOSIS — H539 Unspecified visual disturbance: Secondary | ICD-10-CM | POA: Diagnosis not present

## 2021-08-01 MED ORDER — LIDOCAINE HCL 1 % IJ SOLN
INTRAMUSCULAR | Status: AC
  Administered 2021-08-01: 10 mL
  Filled 2021-08-01: qty 20

## 2021-08-01 NOTE — Telephone Encounter (Signed)
Oral Chemotherapy Pharmacist Encounter  I spoke with patient for overview of: Verzenio for the treatment of metastatic, hormone-receptor positive breast cancer, in combination with faslodex, planned duration until disease progression or unacceptable toxicity.   Counseled patient on administration, dosing, side effects, monitoring, drug-food interactions, safe handling, storage, and disposal.  Patient will take Verzenio 50mg  tablets, 1 tablet by mouth twice daily without regard to food. Dose will be titrated by MD as tolerated.   Patient knows to avoid grapefruit and grapefruit juice.  Verzenio start date: 08/01/2021 (first dose tonight)  Adverse effects include but are not limited to: diarrhea, fatigue, nausea, abdominal pain, decreased blood counts, and increased liver function tests, and joint pains. Severe, life-threatening, and/or fatal interstitial lung disease (ILD) and/or pneumonitis may occur with CDK 4/6 inhibitors.  Patient has anti-emetic on hand and knows to take it if nausea develops.   Patient will obtain anti diarrheal and alert the office of 4 or more loose stools above baseline.  Reviewed with patient importance of keeping a medication schedule and plan for any missed doses. No barriers to medication adherence identified.  Medication reconciliation performed and medication/allergy list updated.  Insurance authorization for [employer] has been obtained. Patient will receive medication from [facility].   Patient informed the pharmacy will reach out 5-7 days prior to needing next fill of Verzenio to coordinate continued medication acquisition to prevent break in therapy.  All questions answered.  Christine Francis voiced understanding and appreciation.   Medication education handout placed in mail for patient. Patient knows to call the office with questions or concerns. Oral Chemotherapy Clinic phone number provided to patient.   Drema Halon, PharmD Hematology/Oncology  Clinical Pharmacist [facility] [phone] 08/01/2021   9:26 AM

## 2021-08-01 NOTE — Progress Notes (Signed)
Patient was initially diagnosed with left breast cancer in January of 2016 with relapse/recurrence in the left breast in May of 2020.  She ultimately underwent bilateral mastectomies with reconstruction in July 2020.  She was hospitalized 4/7-0/96/2836 following complications from liver biopsy.  She has noticed some confusion and mild vision changes that started while she was in the hospital and she reports this has gotten worse.   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  No evidence of intracranial metastatic disease.   Past or anticipated interventions, if any, per medical oncology:  Mendel Ryder NP/ Dr. Lindi Adie 07/31/2021  -Liver biopsy: Consistent with metastatic breast cancer estrogen positive, progesterone negative, HER2 negative. Hospital admission after liver biopsy, discharged on July 27, 2021. -Recommendations: #1 fulvestrant given every 2 weeks x 3 doses then every 4 weeks thereafter. 2.  Abemaciclib 50 mg p.o. twice daily to start tomorrow 3.  Zometa for her hypercalcemia   Recent neurologic symptoms, if any:  Dizziness/ataxia: She is unstable on her feet. Difficulty with hand coordination: Increased difficulty due to decreased vision. Visual deficits/changes: Blurred and double vision in the left eye.  Confusion/Memory deficits: Continues to have confusion, more acute.  SAFETY ISSUES: Prior radiation? No Pacemaker/ICD? No Possible current pregnancy? Postmenopausal Is the patient on methotrexate? No  Additional Complaints / other details:

## 2021-08-01 NOTE — Procedures (Signed)
Ultrasound-guided therapeutic paracentesis performed yielding 4.2 liters of bloody  fluid. No immediate complications. EBL none.

## 2021-08-01 NOTE — Progress Notes (Signed)
Radiation Oncology         ([phone]) [phone] ________________________________  Initial Outpatient Consultation - Conducted via telephone due to current COVID-19 concerns for limiting patient exposure  I spoke with the patient to conduct this consult visit via telephone to spare the patient unnecessary potential exposure in the healthcare setting during the current COVID-19 pandemic. The patient was notified in advance and was offered a [place] meeting to allow for face to face communication but unfortunately reported that they did not have the appropriate resources/technology to support such a visit and instead preferred to proceed with a telephone consult.    Name: Christine Francis        MRN: [id number]  Date of Service: 08/01/2021 DOB: [birth year]  IR:SWNIOEV, Delfino Lovett, MD  Nicholas Lose, MD     REFERRING PHYSICIAN: Nicholas Lose, MD   DIAGNOSIS: The primary encounter diagnosis was Malignant neoplasm of upper-outer quadrant of left breast in female, estrogen receptor positive ([place]). A diagnosis of Secondary malignant neoplasm of orbit [facility]) was also pertinent to this visit.   HISTORY OF PRESENT ILLNESS: Christine Francis is a 61 y.o. female seen at the request of Dr. Lindi Adie for a new left orbital metastasis. The patient was diagnosed with LCIS in the right breast in 2016 and had lumpectomy and adjuvant tamoxifen. She developed recurrence with Stage IA, pT2N0M0,  ER/PR positive grade 2 invasive lobular carcinoma and was treated with bilateral mastectomies. No chemotherapy was recommended due to low risk oncotype dx score. She remained on anastrozole but changed to letrozole in March of 2021. She had blood work in the fall with her PCP and abnormal liver function tests were noted and prompted CT CAP in November 2022 that revealed an enlarged liver with cirrhotic appearing liver with portal venous collaterals and esophageal varices. She had a thickened endometrial lining and borderline enlarged  obturator lymph nodes bilaterally. She was found to be hypercalcemic and a possible parathyroid adenoma was seen by CT neck. A PET scan on 07/05/21 showed diffuse hypermetabolism of her liver, hypermetabolism in her left axillary node, and an increase in abdominopelvic fluid. A ultrasound biopsy of the liver on 07/19/21 showed carcinoma consistent with her known breast cancer. She developed a postprocedural liver bleed and was hospitalized between 07/22/21 and 07/27/21. She met with medical oncology team yesterday and went for an MRI of the brain after noticing changes in her visual field on the left as well as left orbital itching. This revealed a 1.6 cm lesion in the left orbit. No brain disease was noted. She is currently undergoing a paracentesis and her sister is contacted to discuss her case and options of palliative radiotherapy.     PREVIOUS RADIATION THERAPY: No   PAST MEDICAL HISTORY:  Past Medical History:  Diagnosis Date   Allergy    allergy shots weekly   Anxiety    no meds currently   Asthma    Breast cancer ([place])    left - LCIS - bilat mastec   Cancer ([place])    skin- basil cancer face, chest and left leg--   GERD (gastroesophageal reflux disease)    Hyperlipidemia    diet controlled, no meds   Hypertension    IBS (irritable bowel syndrome)    no problems currently   Personal history of colonic adenomas 11/17/2005   11/17/2005 - 3 adenomas - one  A TV adenoma was 12 mm (max)       PAST SURGICAL HISTORY: Past Surgical History:  Procedure Laterality  Date   BIOPSY BREAST  2000   right; benign   BREAST BIOPSY  2012   right breast; pre cancerous   BREAST LUMPECTOMY Right 04/1999, 11/2009   BREAST LUMPECTOMY WITH RADIOACTIVE SEED LOCALIZATION Right 11/29/2014   Procedure: BREAST LUMPECTOMY WITH RADIOACTIVE SEED LOCALIZATION;  Surgeon: Donnie Mesa, MD;  Location: [place];  Service: General;  Laterality: Right;   BREAST RECONSTRUCTION WITH PLACEMENT OF TISSUE  EXPANDER AND ALLODERM Bilateral 02/07/2019   Procedure: BILATERAL BREAST RECONSTRUCTION WITH PLACEMENT OF TISSUE EXPANDER AND ALLODERM;  Surgeon: [name], MD;  Location: [facility];  Service: Plastics;  Laterality: Bilateral;   BREAST RECONSTRUCTION WITH PLACEMENT OF TISSUE EXPANDER AND ALLODERM Left 05/24/2019   Procedure: BREAST RECONSTRUCTION WITH PLACEMENT OF TISSUE EXPANDER AND ALLODERM;  Surgeon: [name], MD;  Location: [facility];  Service: Plastics;  Laterality: Left;   BUNIONECTOMY  1986   bilateral   cervical cryosurgery  12/2013   COLONOSCOPY     x 2   DIAGNOSTIC LAPAROSCOPY     IR ANGIOGRAM SELECTIVE EACH ADDITIONAL VESSEL  07/22/2021   IR ANGIOGRAM SELECTIVE EACH ADDITIONAL VESSEL  07/22/2021   IR ANGIOGRAM SELECTIVE EACH ADDITIONAL VESSEL  07/22/2021   IR ANGIOGRAM VISCERAL SELECTIVE  07/22/2021   IR EMBO ART  VEN HEMORR LYMPH EXTRAV  INC GUIDE ROADMAPPING  07/22/2021   IR US GUIDE VASC ACCESS RIGHT  07/22/2021   laproscopy  1998   LIPOSUCTION Bilateral 01/31/2020   Procedure: LIPOSUCTION BILATERAL LATERAL CHEST WALL;  Surgeon: [name], MD;  Location: [place];  Service: Plastics;  Laterality: Bilateral;   MASTECTOMY W/ SENTINEL NODE BIOPSY Bilateral 02/07/2019   Procedure: BILATERAL MASTECTOMIES WITH LEFT SENTINEL LYMPH NODE BIOPSY;  Surgeon: Donnie Mesa, MD;  Location: [place];  Service: General;  Laterality: Bilateral;  PEC BLOCK   PARATHYROIDECTOMY  2005   POLYPECTOMY     RE-EXCISION OF BREAST LUMPECTOMY Left 02/24/2019   Procedure: RE-EXCISION LEFT MASTECTOMY- LEFT DERMAL FLAP;  Surgeon: Donnie Mesa, MD;  Location: [place];  Service: General;  Laterality: Left;   REMOVAL OF BILATERAL TISSUE EXPANDERS WITH PLACEMENT OF BILATERAL BREAST IMPLANTS Bilateral 01/31/2020   Procedure: REMOVAL OF BILATERAL TISSUE EXPANDERS WITH PLACEMENT OF BILATERAL SILICONE BREAST IMPLANTS;  Surgeon: [name], MD;   Location: [facility];  Service: Plastics;  Laterality: Bilateral;   REMOVAL OF TISSUE EXPANDER AND PLACEMENT OF IMPLANT Left 02/24/2019   Procedure: REMOVAL OF LEFT CHEST TISSUE EXPANDER WITH ALLODERM;  Surgeon: [name], MD;  Location: [facility];  Service: Plastics;  Laterality: Left;   TISSUE EXPANDER FILLING Right 02/24/2019   Procedure: RIGHT CHEST TISSUE EXPANDER FILLING;  Surgeon: [name], MD;  Location: [facility];  Service: Plastics;  Laterality: Right;  (463m 0.9% Normal [place])   uterine bx     thickening of uterus was the reason for havin bx   WISDOM TOOTH EXTRACTION       FAMILY HISTORY:  Family History  Problem Relation Age of Onset   Hypertension Father    Cancer Father        melanoma/skin cancer   Colon polyps Father    Colon cancer Neg Hx    Rectal cancer Neg Hx    Stomach cancer Neg Hx    Esophageal cancer Neg Hx      SOCIAL HISTORY:  reports that she has never smoked. She has never used smokeless tobacco. She reports current alcohol use  of about 1.0 standard drink per week. She reports that she does not use drugs. The patient is single and lives in [place]. 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 She has a nephew who lives in town and who is a Government social research officer resident at [employer]. Her sister lives in [place] and is a Veterinary surgeon at [employer].    ALLERGIES: Ampicillin, Dilaudid [hydromorphone hcl], Erythromycin, Penicillins, Sulfa drugs cross reactors, and Declomycin [demeclocycline]   MEDICATIONS:  Current Outpatient Medications  Medication Sig Dispense Refill   abemaciclib (VERZENIO) 50 MG tablet Take 1 tablet (50 mg total) by mouth 2 (two) times daily. Swallow tablets whole. Do not chew, crush, or split tablets before swallowing. 60 tablet 1   albuterol (PROVENTIL HFA;VENTOLIN HFA) 108 (90 BASE) MCG/ACT inhaler Inhale 1-2 puffs into the lungs every 6 (six) hours as needed for wheezing or  shortness of breath.      ALPRAZolam (XANAX) 0.5 MG tablet Take 0.5 mg by mouth daily as needed for anxiety.     atenolol (TENORMIN) 50 MG tablet Take 1 tablet (50 mg total) by mouth daily. 30 tablet 0   EPINEPHrine 0.3 mg/0.3 mL IJ SOAJ injection Inject 0.3 mg into the muscle once as needed for anaphylaxis.     fluticasone furoate-vilanterol (BREO ELLIPTA) 200-25 MCG/INH AEPB Inhale 1 puff into the lungs daily as needed (shortness of breath).     furosemide (LASIX) 40 MG tablet Take 1 tablet (40 mg total) by mouth 2 (two) times daily. 60 tablet 2   hydrochlorothiazide (HYDRODIURIL) 25 MG tablet Take 1 tablet (25 mg total) by mouth daily. 30 tablet 1   lactulose (CHRONULAC) 10 GM/15ML solution Take 30 mLs (20 g total) by mouth 3 (three) times daily. 946 mL 0   montelukast (SINGULAIR) 10 MG tablet Take 10 mg by mouth daily.      omeprazole (PRILOSEC OTC) 20 MG tablet Take 20 mg by mouth daily.     Phenylephrine-DM-GG-APAP (MUCINEX SINUS-MAX) 5-10-200-325 MG TABS Take 2 tablets by mouth daily as needed (congestion).     potassium chloride SA (KLOR-CON M) 20 MEQ tablet Take 2 tablets (40 mEq total) by mouth 2 (two) times daily. 120 tablet 1   spironolactone (ALDACTONE) 25 MG tablet Take 1 tablet (25 mg total) by mouth daily. 30 tablet 1   No current facility-administered medications for this encounter.     REVIEW OF SYSTEMS: On review of systems, the patient's sister states she has been somewhat confused given recent metabolic abnormalities in calcium and ammonia. She has been unsteady with walking, somewhat confused at times, and has had some complaints of blurred vision in her central left visual field, some flashing lights in her left visual field, and blurred peripheral vision laterally. They have not noticed any exophthalmos. She has complained of itching of her left eye, and rubs it quite often. A few weeks ago her family noticed more squinting of her left eye as well. No other complaints are  verbalized.   PHYSICAL EXAM:  Unable to assess due to encounter type.    ECOG = 1-2  0 - Asymptomatic (Fully active, able to carry on all predisease activities without restriction)  1 - Symptomatic but completely ambulatory (Restricted in physically strenuous activity but ambulatory and able to carry out work of a light or sedentary nature. For example, light housework, office work)  2 - Symptomatic, <50% in bed during the day (Ambulatory and capable of all self care but unable to carry out any work  activities. Up and about more than 50% of waking hours)  3 - Symptomatic, >50% in bed, but not bedbound (Capable of only limited self-care, confined to bed or chair 50% or more of waking hours)  4 - Bedbound (Completely disabled. Cannot carry on any self-care. Totally confined to bed or chair)  5 - Death   Eustace Pen MM, Creech RH, Tormey DC, et al. [phone]). "Toxicity and response criteria of the [facility]". [place] Oncol. 5 (6): 649-55    LABORATORY DATA:  Lab Results  Component Value Date   WBC 18.0 (H) 07/31/2021   HGB 9.2 (L) 07/31/2021   HCT 28.4 (L) 07/31/2021   MCV 89.0 07/31/2021   PLT 292 07/31/2021   Lab Results  Component Value Date   NA 128 (L) 07/31/2021   K 3.4 (L) 07/31/2021   CL 95 (L) 07/31/2021   CO2 26 07/31/2021   Lab Results  Component Value Date   ALT 47 (H) 07/31/2021   AST 126 (H) 07/31/2021   ALKPHOS 141 (H) 07/31/2021   BILITOT 2.7 (H) 07/31/2021      RADIOGRAPHY: DG Chest 2 View  Result Date: 07/22/2021 CLINICAL DATA:  Shortness of breath after liver biopsy. EXAM: CHEST - 2 VIEW COMPARISON:  None. FINDINGS: The heart size and mediastinal contours are within normal limits. Right lung is clear. Minimal left lingular subsegmental atelectasis or scarring is noted. The visualized skeletal structures are unremarkable. IMPRESSION: Minimal left lingular subsegmental atelectasis or scarring. Electronically Signed   By: Marijo Conception M.D.   On: 07/22/2021 15:12   CT Head Wo Contrast  Result Date: 07/22/2021 CLINICAL DATA:  Head trauma moderate to severe. Brain Mets suspected. EXAM: CT HEAD WITHOUT CONTRAST TECHNIQUE: Contiguous axial images were obtained from the base of the skull through the vertex without intravenous contrast. COMPARISON:  None. FINDINGS: Brain: No evidence of acute infarction, hemorrhage, hydrocephalus, extra-axial collection or mass lesion/mass effect. Vascular: No hyperdense vessel or unexpected calcification. Skull: Normal. Negative for fracture or focal lesion. Sinuses/Orbits: No acute finding. Other: None. IMPRESSION: No acute intracranial abnormality. No appreciable mass on this noncontrast enhanced examination. Electronically Signed   By: Keane Police D.O.   On: 07/22/2021 15:12   MR Brain W Wo Contrast  Result Date: 07/31/2021 CLINICAL DATA:  Breast cancer, staging EXAM: MRI HEAD WITHOUT AND WITH CONTRAST TECHNIQUE: Multiplanar, multiecho pulse sequences of the brain and surrounding structures were obtained without and with intravenous contrast. CONTRAST:  37m GADAVIST GADOBUTROL 1 MMOL/ML IV SOLN COMPARISON:  CT head 07/22/2021 FINDINGS: Brain: There is no evidence of acute intracranial hemorrhage, extra-axial fluid collection, or acute infarct. Parenchymal volume is normal. The ventricles are normal in size. Scattered nonenhancing foci of FLAIR signal abnormality in the subcortical and periventricular white matter likely reflects sequela of minimal chronic white matter microangiopathy. There is no suspicious parenchymal signal abnormality. There is no mass lesion or abnormal enhancement. There is no midline shift. Vascular: Normal flow voids. Skull and upper cervical spine: Normal marrow signal. Sinuses/Orbits: There is enhancing tissue in the left orbit measuring approximately 1.6 cm AP x 1.0 cm TV by 1.2 cm cc. Other: None. IMPRESSION: 1. Enhancing lesion in the left orbit measuring up to 1.6 cm  concerning for orbital metastatic disease. 2. No evidence of intracranial metastatic disease. 3. For follow-up studies, recommend adding dedicated orbital MRI. Electronically Signed   By: PValetta MoleM.D.   On: 07/31/2021 17:11   IR Angiogram Visceral Selective  Result Date: 07/23/2021 CLINICAL DATA:  Recent percutaneous liver biopsy, now with increasing abdominal ascites and pericapsular pseudoaneurysm. EXAM: SELECTIVE VISCERAL ARTERIOGRAPHY; IR ULTRASOUND GUIDANCE VASC ACCESS RIGHT; IR EMBO ART VEN HEMORR LYMPH EXTRAV INC GUIDE ROADMAPPING; ADDITIONAL ARTERIOGRAPHY ANESTHESIA/SEDATION: Intravenous Fentanyl 144mg and Versed 1.559mwere administered as conscious sedation during continuous monitoring of the patient's level of consciousness and physiological / cardiorespiratory status by the radiology RN, with a total moderate sedation time of 86 minutes. MEDICATIONS: Lidocaine 1% subcutaneous CONTRAST:  6026mMNIPAQUE IOHEXOL 300 MG/ML  SOLN PROCEDURE: The procedure, risks (including but not limited to bleeding, infection, organ damage ), benefits, and alternatives were explained to the patient. Questions regarding the procedure were encouraged and answered. The patient understands and consents to the procedure. Right femoral region prepped and draped in usual sterile fashion. Maximal barrier sterile technique was utilized including caps, mask, sterile gowns, sterile gloves, sterile drape, hand hygiene and skin antiseptic. The right common femoral artery was localized under ultrasound. Under real-time ultrasound guidance, the vessel was accessed with a 21-gauge micropuncture needle, exchanged over a 018 guidewire for a transitional dilator, through which a 035 guidewire was advanced. Over this, a 5 Fre[place]cular sheath was placed, through which a 5 [place] catheter was advanced and used to selectively catheterize the celiac axis for selective arteriography in multiple projections. The C2 was then utilized to  selectively catheterize the common hepatic artery artery for selective arteriography in multiple projections. The C2 was then utilized to selectively catheterize the right hepatic artery for selective arteriography. Coaxial Renegade microcatheter was advanced with a 016 guidewire and used for selective catheterization of a peripheral right hepatic arterial branch supplying the pseudoaneurysm. Once the catheter was positioned as peripherally is possible, a single 2 mm X 2 cm interlock coil was deployed. After 4 minutes, confirmatory arteriogram was obtained showing no further opacification of the pseudoaneurysm. The microcatheter, catheter and sheath were removed and hemostasis achieved with the aid of the Exoseal device after confirmatory femoral arteriography. The patient tolerated the procedure well. COMPLICATIONS: None immediate FINDINGS: Continued opacification of extracapsular pseudoaneurysm from the right hepatic lobe supplied by small right hepatic arterial branch. Technically successful coil embolization of a peripheral feeding branch, with no further opacification of the pseudoaneurysm. IMPRESSION: 1. Technically successful coil embolization of the feeding branch to right hepatic arterial pericapsular pseudoaneurysm. Electronically Signed   By: D  Lucrezia EuropeD.   On: 07/23/2021 08:25   IR Angiogram Selective Each Additional Vessel  Result Date: 07/23/2021 CLINICAL DATA:  Recent percutaneous liver biopsy, now with increasing abdominal ascites and pericapsular pseudoaneurysm. EXAM: SELECTIVE VISCERAL ARTERIOGRAPHY; IR ULTRASOUND GUIDANCE VASC ACCESS RIGHT; IR EMBO ART VEN HEMORR LYMPH EXTRAV INC GUIDE ROADMAPPING; ADDITIONAL ARTERIOGRAPHY ANESTHESIA/SEDATION: Intravenous Fentanyl 100m20mnd Versed 1.5mg 73me administered as conscious sedation during continuous monitoring of the patient's level of consciousness and physiological / cardiorespiratory status by the radiology RN, with a total moderate sedation  time of 86 minutes. MEDICATIONS: Lidocaine 1% subcutaneous CONTRAST:  60mL 28mPAQUE IOHEXOL 300 MG/ML  SOLN PROCEDURE: The procedure, risks (including but not limited to bleeding, infection, organ damage ), benefits, and alternatives were explained to the patient. Questions regarding the procedure were encouraged and answered. The patient understands and consents to the procedure. Right femoral region prepped and draped in usual sterile fashion. Maximal barrier sterile technique was utilized including caps, mask, sterile gowns, sterile gloves, sterile drape, hand hygiene and skin antiseptic. The right common femoral artery was localized under ultrasound. Under real-time ultrasound guidance,  the vessel was accessed with a 21-gauge micropuncture needle, exchanged over a 018 guidewire for a transitional dilator, through which a 035 guidewire was advanced. Over this, a 5 [place] vascular sheath was placed, through which a 5 [place] C2 catheter was advanced and used to selectively catheterize the celiac axis for selective arteriography in multiple projections. The C2 was then utilized to selectively catheterize the common hepatic artery artery for selective arteriography in multiple projections. The C2 was then utilized to selectively catheterize the right hepatic artery for selective arteriography. Coaxial Renegade microcatheter was advanced with a 016 guidewire and used for selective catheterization of a peripheral right hepatic arterial branch supplying the pseudoaneurysm. Once the catheter was positioned as peripherally is possible, a single 2 mm X 2 cm interlock coil was deployed. After 4 minutes, confirmatory arteriogram was obtained showing no further opacification of the pseudoaneurysm. The microcatheter, catheter and sheath were removed and hemostasis achieved with the aid of the Exoseal device after confirmatory femoral arteriography. The patient tolerated the procedure well. COMPLICATIONS: None immediate  FINDINGS: Continued opacification of extracapsular pseudoaneurysm from the right hepatic lobe supplied by small right hepatic arterial branch. Technically successful coil embolization of a peripheral feeding branch, with no further opacification of the pseudoaneurysm. IMPRESSION: 1. Technically successful coil embolization of the feeding branch to right hepatic arterial pericapsular pseudoaneurysm. Electronically Signed   By: Lucrezia Europe M.D.   On: 07/23/2021 08:25   IR Angiogram Selective Each Additional Vessel  Result Date: 07/23/2021 CLINICAL DATA:  Recent percutaneous liver biopsy, now with increasing abdominal ascites and pericapsular pseudoaneurysm. EXAM: SELECTIVE VISCERAL ARTERIOGRAPHY; IR ULTRASOUND GUIDANCE VASC ACCESS RIGHT; IR EMBO ART VEN HEMORR LYMPH EXTRAV INC GUIDE ROADMAPPING; ADDITIONAL ARTERIOGRAPHY ANESTHESIA/SEDATION: Intravenous Fentanyl 135mg and Versed 1.522mwere administered as conscious sedation during continuous monitoring of the patient's level of consciousness and physiological / cardiorespiratory status by the radiology RN, with a total moderate sedation time of 86 minutes. MEDICATIONS: Lidocaine 1% subcutaneous CONTRAST:  6059mMNIPAQUE IOHEXOL 300 MG/ML  SOLN PROCEDURE: The procedure, risks (including but not limited to bleeding, infection, organ damage ), benefits, and alternatives were explained to the patient. Questions regarding the procedure were encouraged and answered. The patient understands and consents to the procedure. Right femoral region prepped and draped in usual sterile fashion. Maximal barrier sterile technique was utilized including caps, mask, sterile gowns, sterile gloves, sterile drape, hand hygiene and skin antiseptic. The right common femoral artery was localized under ultrasound. Under real-time ultrasound guidance, the vessel was accessed with a 21-gauge micropuncture needle, exchanged over a 018 guidewire for a transitional dilator, through which a 035  guidewire was advanced. Over this, a 5 Fre[place]cular sheath was placed, through which a 5 [place] catheter was advanced and used to selectively catheterize the celiac axis for selective arteriography in multiple projections. The C2 was then utilized to selectively catheterize the common hepatic artery artery for selective arteriography in multiple projections. The C2 was then utilized to selectively catheterize the right hepatic artery for selective arteriography. Coaxial Renegade microcatheter was advanced with a 016 guidewire and used for selective catheterization of a peripheral right hepatic arterial branch supplying the pseudoaneurysm. Once the catheter was positioned as peripherally is possible, a single 2 mm X 2 cm interlock coil was deployed. After 4 minutes, confirmatory arteriogram was obtained showing no further opacification of the pseudoaneurysm. The microcatheter, catheter and sheath were removed and hemostasis achieved with the aid of the Exoseal device after confirmatory femoral arteriography. The patient tolerated  the procedure well. COMPLICATIONS: None immediate FINDINGS: Continued opacification of extracapsular pseudoaneurysm from the right hepatic lobe supplied by small right hepatic arterial branch. Technically successful coil embolization of a peripheral feeding branch, with no further opacification of the pseudoaneurysm. IMPRESSION: 1. Technically successful coil embolization of the feeding branch to right hepatic arterial pericapsular pseudoaneurysm. Electronically Signed   By: Lucrezia Europe M.D.   On: 07/23/2021 08:25   IR Angiogram Selective Each Additional Vessel  Result Date: 07/23/2021 CLINICAL DATA:  Recent percutaneous liver biopsy, now with increasing abdominal ascites and pericapsular pseudoaneurysm. EXAM: SELECTIVE VISCERAL ARTERIOGRAPHY; IR ULTRASOUND GUIDANCE VASC ACCESS RIGHT; IR EMBO ART VEN HEMORR LYMPH EXTRAV INC GUIDE ROADMAPPING; ADDITIONAL ARTERIOGRAPHY  ANESTHESIA/SEDATION: Intravenous Fentanyl 150mg and Versed 1.528mwere administered as conscious sedation during continuous monitoring of the patient's level of consciousness and physiological / cardiorespiratory status by the radiology RN, with a total moderate sedation time of 86 minutes. MEDICATIONS: Lidocaine 1% subcutaneous CONTRAST:  6050mMNIPAQUE IOHEXOL 300 MG/ML  SOLN PROCEDURE: The procedure, risks (including but not limited to bleeding, infection, organ damage ), benefits, and alternatives were explained to the patient. Questions regarding the procedure were encouraged and answered. The patient understands and consents to the procedure. Right femoral region prepped and draped in usual sterile fashion. Maximal barrier sterile technique was utilized including caps, mask, sterile gowns, sterile gloves, sterile drape, hand hygiene and skin antiseptic. The right common femoral artery was localized under ultrasound. Under real-time ultrasound guidance, the vessel was accessed with a 21-gauge micropuncture needle, exchanged over a 018 guidewire for a transitional dilator, through which a 035 guidewire was advanced. Over this, a 5 FrePakistanscular sheath was placed, through which a 5 [place] catheter was advanced and used to selectively catheterize the celiac axis for selective arteriography in multiple projections. The C2 was then utilized to selectively catheterize the common hepatic artery artery for selective arteriography in multiple projections. The C2 was then utilized to selectively catheterize the right hepatic artery for selective arteriography. Coaxial Renegade microcatheter was advanced with a 016 guidewire and used for selective catheterization of a peripheral right hepatic arterial branch supplying the pseudoaneurysm. Once the catheter was positioned as peripherally is possible, a single 2 mm X 2 cm interlock coil was deployed. After 4 minutes, confirmatory arteriogram was obtained showing no  further opacification of the pseudoaneurysm. The microcatheter, catheter and sheath were removed and hemostasis achieved with the aid of the Exoseal device after confirmatory femoral arteriography. The patient tolerated the procedure well. COMPLICATIONS: None immediate FINDINGS: Continued opacification of extracapsular pseudoaneurysm from the right hepatic lobe supplied by small right hepatic arterial branch. Technically successful coil embolization of a peripheral feeding branch, with no further opacification of the pseudoaneurysm. IMPRESSION: 1. Technically successful coil embolization of the feeding branch to right hepatic arterial pericapsular pseudoaneurysm. Electronically Signed   By: D  Lucrezia EuropeD.   On: 07/23/2021 08:25   NM PET Image Initial (PI) Skull Base To Thigh  Result Date: 07/05/2021 CLINICAL DATA:  Initial treatment strategy for left-sided breast cancer. Suspicion of metastatic disease based on laboratory values. EXAM: NUCLEAR MEDICINE PET SKULL BASE TO THIGH TECHNIQUE: 9.5 mCi F-18 FDG was injected intravenously. Full-ring PET imaging was performed from the skull base to thigh after the radiotracer. CT data was obtained and used for attenuation correction and anatomic localization. Fasting blood glucose: 84 mg/dl COMPARISON:  Neck CT 06/28/2021. Bone scan of 06/26/2021. Chest abdomen and pelvic CTs of 06/12/2021 FINDINGS: Mediastinal blood pool activity: SUV max  2.3 Liver activity: SUV max NA NECK: No cervical nodal hypermetabolism. Symmetric palatine tonsil hypermetabolism is favored to be physiologic. Incidental CT findings: Multiple small bilateral cervical nodes, none pathologic by size criteria. CHEST: And inferior left axillary node is somewhat rounded at 1.0 cm and corresponds to hypermetabolism at a S.U.V. max of 3.7 on 66/4. Other more cephalad bilateral axillary nodes maintain their fatty hila and demonstrate low-level hypermetabolism, including at up to a S.U.V. max of 2.9 on 59/4,  favored to be benign. No pulmonary parenchymal hypermetabolism. Incidental CT findings: Deferred to recent diagnostic CT. Aortic atherosclerosis. Bilateral breast implants ABDOMEN/PELVIS: Diffuse moderate hepatic hypermetabolism which given hepatic morphology and clinical history is suspicious for "pseudo cirrhosis". Example diffuse heterogeneous hypermetabolism at a S.U.V. max of 7.5 within the right hepatic lobe on 110/4 and within the left hepatic lobe at a S.U.V. max of 9.0 on 130/4. No abdominopelvic nodal hypermetabolism. Perineal hypermetabolism at a S.U.V. max of 23.5 is without CT correlate favored to be related to urinary contamination. Incidental CT findings: Deferred to recent diagnostic CT. Similar trace perihepatic ascites. Portal venous hypertension with a recanalized paraumbilical vein. Small volume pelvic fluid, new. SKELETON: Mildly heterogeneously increased marrow activity throughout. Example in the right sacrum at a S.U.V. max of 4.6 and within the left humeral head at a S.U.V. max of 3.3. No CT correlates. Incidental CT findings: none IMPRESSION: 1. Relatively diffuse moderate hepatic hypermetabolism in the setting of apparent cirrhosis on CT. 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 Differential considerations include infiltrative hepatocellular carcinoma. Recommend tissue sampling. 2. Hypermetabolic left axillary node, more equivocal but suspicious for metastatic disease. 3. Mildly heterogeneously increased marrow activity, nonspecific. If the patient is eventually diagnosed with metastatic breast cancer, early osseous metastasis would be a concern. 4. Portal venous hypertension with slight increase in abdominopelvic fluid. 5. Perineal hypermetabolism which is favored to be related to urinary contamination. Consider physical exam correlation. Electronically Signed   By: Abigail Miyamoto M.D.   On: 07/05/2021 21:22   US BIOPSY  (LIVER)  Result Date: 07/19/2021 INDICATION: History of breast cancer.  Concern for tumor replacement of liver. EXAM: ULTRASOUND GUIDED LIVER BIOPSY, NON-TARGETED COMPARISON:  CT chest abdomen pelvis, 06/12/2021. PET-CT, 07/06/2019 MEDICATIONS: None ANESTHESIA/SEDATION: Fentanyl 50 mcg IV; Versed 1 mg IV Total Moderate Sedation time: 20 minutes; The patient was continuously monitored during the procedure by the interventional radiology nurse under my direct supervision. COMPLICATIONS: None immediate. PROCEDURE: Informed written consent was obtained from the patient after a discussion of the risks, benefits and alternatives to treatment. The patient understands and consents the procedure. A timeout was performed prior to the initiation of the procedure. Ultrasound scanning was performed of the right upper abdominal quadrant and the procedure was planned. The right upper abdomen was prepped and draped in the usual sterile fashion. The overlying soft tissues were anesthetized with 1% lidocaine with epinephrine. A 17 gauge, 6.8 cm co-axial needle was advanced into a peripheral aspect of the right lobe of the liver and 3 core biopsies were obtained with an 18 gauge core device under direct ultrasound guidance. The co-axial needle track was embolized with the administration of a Gel-Foam slurry. Superficial hemostasis was obtained with manual compression. Post procedural scanning was negative for definitive area of hemorrhage. A dressing was placed. The patient tolerated the procedure well without immediate post procedural complication. FINDINGS: Heterogeneous hepatic parenchymal echotexture, with nodular contour. IMPRESSION: Successful ultrasound-guided liver biopsy, as above. Michaelle Birks,  MD [facility] Electronically Signed   By: Michaelle Birks M.D.   On: 07/19/2021 16:14   US Paracentesis  Result Date: 07/24/2021 INDICATION: Metastatic breast cancer to the  liver. Ascites. Recent hemorrhage following percutaneous liver biopsy requiring embolization. Request for diagnostic and therapeutic paracentesis EXAM: ULTRASOUND GUIDED RIGHT LOWER QUADRANT PARACENTESIS MEDICATIONS: 1% plain lidocaine, 5 mL COMPLICATIONS: None immediate. PROCEDURE: Informed written consent was obtained from the patient after a discussion of the risks, benefits and alternatives to treatment. A timeout was performed prior to the initiation of the procedure. Initial ultrasound scanning demonstrates a large amount of ascites within the right lower abdominal quadrant. The right lower abdomen was prepped and draped in the usual sterile fashion. 1% lidocaine was used for local anesthesia. Following this, a 19 gauge, 7-cm, Yueh catheter was introduced. An ultrasound image was saved for documentation purposes. The paracentesis was performed. The catheter was removed and a dressing was applied. The patient tolerated the procedure well without immediate post procedural complication. FINDINGS: A total of approximately 4.8 L of dark bloody fluid was removed. Samples were sent to the laboratory as requested by the clinical team. IMPRESSION: Successful ultrasound-guided paracentesis yielding 4.8 liters of bloody peritoneal fluid. Read by: Ascencion Dike PA-C Electronically Signed   By: Corrie Mckusick D.O.   On: 07/24/2021 16:09   IR US Guide Vasc Access Right  Result Date: 07/23/2021 CLINICAL DATA:  Recent percutaneous liver biopsy, now with increasing abdominal ascites and pericapsular pseudoaneurysm. EXAM: SELECTIVE VISCERAL ARTERIOGRAPHY; IR ULTRASOUND GUIDANCE VASC ACCESS RIGHT; IR EMBO ART VEN HEMORR LYMPH EXTRAV INC GUIDE ROADMAPPING; ADDITIONAL ARTERIOGRAPHY ANESTHESIA/SEDATION: Intravenous Fentanyl 142mg and Versed 1.575mwere administered as conscious sedation during continuous monitoring of the patient's level of consciousness and physiological / cardiorespiratory status by the radiology RN, with a  total moderate sedation time of 86 minutes. MEDICATIONS: Lidocaine 1% subcutaneous CONTRAST:  6079mMNIPAQUE IOHEXOL 300 MG/ML  SOLN PROCEDURE: The procedure, risks (including but not limited to bleeding, infection, organ damage ), benefits, and alternatives were explained to the patient. Questions regarding the procedure were encouraged and answered. The patient understands and consents to the procedure. Right femoral region prepped and draped in usual sterile fashion. Maximal barrier sterile technique was utilized including caps, mask, sterile gowns, sterile gloves, sterile drape, hand hygiene and skin antiseptic. The right common femoral artery was localized under ultrasound. Under real-time ultrasound guidance, the vessel was accessed with a 21-gauge micropuncture needle, exchanged over a 018 guidewire for a transitional dilator, through which a 035 guidewire was advanced. Over this, a 5 FrePakistanscular sheath was placed, through which a 5 [place] catheter was advanced and used to selectively catheterize the celiac axis for selective arteriography in multiple projections. The C2 was then utilized to selectively catheterize the common hepatic artery artery for selective arteriography in multiple projections. The C2 was then utilized to selectively catheterize the right hepatic artery for selective arteriography. Coaxial Renegade microcatheter was advanced with a 016 guidewire and used for selective catheterization of a peripheral right hepatic arterial branch supplying the pseudoaneurysm. Once the catheter was positioned as peripherally is possible, a single 2 mm X 2 cm interlock coil was deployed. After 4 minutes, confirmatory arteriogram was obtained showing no further opacification of the pseudoaneurysm. The microcatheter, catheter and sheath were removed and hemostasis achieved with the aid of the Exoseal device after confirmatory femoral arteriography. The patient tolerated the procedure well.  COMPLICATIONS: None immediate FINDINGS: Continued opacification of extracapsular  pseudoaneurysm from the right hepatic lobe supplied by small right hepatic arterial branch. Technically successful coil embolization of a peripheral feeding branch, with no further opacification of the pseudoaneurysm. IMPRESSION: 1. Technically successful coil embolization of the feeding branch to right hepatic arterial pericapsular pseudoaneurysm. Electronically Signed   By: Lucrezia Europe M.D.   On: 07/23/2021 08:25   IR EMBO ART  VEN HEMORR LYMPH EXTRAV  INC GUIDE ROADMAPPING  Result Date: 07/23/2021 CLINICAL DATA:  Recent percutaneous liver biopsy, now with increasing abdominal ascites and pericapsular pseudoaneurysm. EXAM: SELECTIVE VISCERAL ARTERIOGRAPHY; IR ULTRASOUND GUIDANCE VASC ACCESS RIGHT; IR EMBO ART VEN HEMORR LYMPH EXTRAV INC GUIDE ROADMAPPING; ADDITIONAL ARTERIOGRAPHY ANESTHESIA/SEDATION: Intravenous Fentanyl 13mg and Versed 1.53mwere administered as conscious sedation during continuous monitoring of the patient's level of consciousness and physiological / cardiorespiratory status by the radiology RN, with a total moderate sedation time of 86 minutes. MEDICATIONS: Lidocaine 1% subcutaneous CONTRAST:  6017mMNIPAQUE IOHEXOL 300 MG/ML  SOLN PROCEDURE: The procedure, risks (including but not limited to bleeding, infection, organ damage ), benefits, and alternatives were explained to the patient. Questions regarding the procedure were encouraged and answered. The patient understands and consents to the procedure. Right femoral region prepped and draped in usual sterile fashion. Maximal barrier sterile technique was utilized including caps, mask, sterile gowns, sterile gloves, sterile drape, hand hygiene and skin antiseptic. The right common femoral artery was localized under ultrasound. Under real-time ultrasound guidance, the vessel was accessed with a 21-gauge micropuncture needle, exchanged over a 018 guidewire for a  transitional dilator, through which a 035 guidewire was advanced. Over this, a 5 FrePakistanscular sheath was placed, through which a 5 [place] catheter was advanced and used to selectively catheterize the celiac axis for selective arteriography in multiple projections. The C2 was then utilized to selectively catheterize the common hepatic artery artery for selective arteriography in multiple projections. The C2 was then utilized to selectively catheterize the right hepatic artery for selective arteriography. Coaxial Renegade microcatheter was advanced with a 016 guidewire and used for selective catheterization of a peripheral right hepatic arterial branch supplying the pseudoaneurysm. Once the catheter was positioned as peripherally is possible, a single 2 mm X 2 cm interlock coil was deployed. After 4 minutes, confirmatory arteriogram was obtained showing no further opacification of the pseudoaneurysm. The microcatheter, catheter and sheath were removed and hemostasis achieved with the aid of the Exoseal device after confirmatory femoral arteriography. The patient tolerated the procedure well. COMPLICATIONS: None immediate FINDINGS: Continued opacification of extracapsular pseudoaneurysm from the right hepatic lobe supplied by small right hepatic arterial branch. Technically successful coil embolization of a peripheral feeding branch, with no further opacification of the pseudoaneurysm. IMPRESSION: 1. Technically successful coil embolization of the feeding branch to right hepatic arterial pericapsular pseudoaneurysm. Electronically Signed   By: D  Lucrezia EuropeD.   On: 07/23/2021 08:25   CT Angio Abd/Pel W and/or Wo Contrast  Result Date: 07/22/2021 CLINICAL DATA:  Recent liver biopsy. Free fluid on bedside ultrasound. Evaluate for liver bleed. EXAM: CT ANGIOGRAPHY ABDOMEN AND PELVIS WITH CONTRAST AND WITHOUT CONTRAST TECHNIQUE: Multidetector CT imaging of the abdomen and pelvis was performed using the standard  protocol during bolus administration of intravenous contrast. Multiplanar reconstructed images and MIPs were obtained and reviewed to evaluate the vascular anatomy. CONTRAST:  100m3mNIPAQUE IOHEXOL 350 MG/ML SOLN COMPARISON:  06/12/2021 FINDINGS: VASCULAR Aorta: Atherosclerotic calcifications in the abdominal aorta without aneurysm, dissection or significant stenosis. Celiac: Patent without evidence of aneurysm, dissection, vasculitis  or significant stenosis. Variant anatomy with an accessory left hepatic artery coming off the left gastric artery. There is focal contrast extravasation just lateral to the right hepatic lobe on the arterial phase imaging on sequence 6, image 71. This is well seen on the coronal images, sequence 7, image 73. This is a relatively well-defined elongated structure on the arterial coronal arterial images, measuring up to 1.6 cm. This area does not significantly change in size on the delayed images and there is no clear evidence for contrast pooling outside of this isolated elongated structure. Finding is unusual but could represent apseudoaneurysm near the liver capsule. SMA: Patent without evidence of aneurysm, dissection, vasculitis or significant stenosis. Renals: Both renal arteries are patent without evidence of aneurysm, dissection, vasculitis, fibromuscular dysplasia or significant stenosis. IMA: Patent without evidence of aneurysm, dissection, vasculitis or significant stenosis. Inflow: Patent without evidence of aneurysm, dissection, vasculitis or significant stenosis. Proximal Outflow: Proximal femoral arteries are patent bilaterally. Veins: IVC and renal veins are patent. Main portal venous system appears to be patent but limited evaluation of intrahepatic portal veins. There is enlarged and umbilical vein along the anterior abdomen. Iliac veins are patent. Review of the MIP images confirms the above findings. NON-VASCULAR Lower chest: Bilateral breast implants. No pleural  effusions. Patchy densities at lung bases likely represent atelectasis. Hepatobiliary: Liver is heterogeneous. No large discrete liver lesions. Normal appearance of the gallbladder. No significant biliary dilatation. Pancreas: Unremarkable. No pancreatic ductal dilatation or surrounding inflammatory changes. Spleen: Normal in size without focal abnormality. Adrenals/Urinary Tract: Normal appearance of the adrenal glands. Normal appearance of the right kidney. Left renal cysts. Negative for hydronephrosis. Small amount of fluid in the urinary bladder. Stomach/Bowel: Normal appearance of the stomach. There is no evidence for bowel distention or focal bowel inflammation. Lymphatic: No lymph node enlargement in the abdomen or pelvis. Reproductive: Uterus and bilateral adnexa are unremarkable. Other: Moderate amount of ascites in the abdomen and pelvis. Hounsfield units of the perihepatic ascites is relatively low density measuring between 11-13. Fluid in the cul-de-sac measures approximately 24. Most dense ascites is in the right lower quadrant measuring roughly 26 Hounsfield units. The ascites has increased from the PET-CT on 07/05/2021. Negative for free air. Musculoskeletal: No acute bone abnormality. IMPRESSION: VASCULAR 1. Focal area of contrast extravasation just lateral to the liver capsule in the right hepatic lobe. This location corresponds with the recent biopsy site. This area of contrast extravasation may be contained because it does not significantly enlarge on the delayed images. This could represent an elongated pseudoaneurysm along the capsule measuring roughly 1.6 cm. Dedicated ultrasound of this area with duplex imaging may better evaluate this area. 2.  Aortic Atherosclerosis (ICD10-I70.0). 3. Persistent enlarged umbilical vein. Finding raises concern for underlying portal hypertension. NON-VASCULAR 1. Increased free fluid in the abdomen and pelvis compared to 07/05/2021. Hounsfield units are slightly  dense but more dense than the previous PET-CT. Suspect this fluid represents a combination of blood and underlying ascites. 2. Again noted is a heterogeneous liver without a discrete lesion. These results were called by telephone at the time of interpretation on 07/22/2021 at 4:28 pm [facility] , who verbally acknowledged these results. Electronically Signed   By: Markus Daft M.D.   On: 07/22/2021 16:51       IMPRESSION/PLAN: 1. Recurrent metastatic ER positive lobular carcinoma of the breast with liver and left orbital metastasis. Dr. Lisbeth Renshaw discusses the patient's course and imaging as well as recent liver pathology.  Dr. Lisbeth Renshaw discusses the rationale for radiotherapy to the left orbital lesion. We discussed the risks, benefits, short, and long term effects of radiotherapy, as well as the palliative intent, and the patient's sister who acts as her surrogate decision maker is interested in proceeding. Dr. Lisbeth Renshaw discusses the delivery and logistics of radiotherapy and anticipates a course of 2 weeks of radiotherapy to the left orbit. She will come for simulation tomorrow at 10:30 am. She will sign written consent at that time. She will continue with plans for her Zometa, Verzenio and Faslodex with Dr. Lindi Adie.   Given current concerns for patient exposure during the COVID-19 pandemic, this encounter was conducted via telephone.  The patient has provided two factor identification and has given verbal consent for this type of encounter and has been advised to only accept a meeting of this type in a secure network environment. The time spent during this encounter was 60 minutes including preparation, discussion, and coordination of the patient's care. The attendants for this meeting include Blenda Nicely, RN, Dr. Lisbeth Renshaw, Hayden Pedro  and the patient's sister Christine Francis, surrogate decision maker. During the encounter,  Blenda Nicely, RN, Dr. Lisbeth Renshaw, and Hayden Pedro were located at  [facility] Radiation Oncology Department.  Christine Francis was located in radiology having a paracentesis at the time of our discussion with her sister Christine Francis who was in the radiology department as well.     The above documentation reflects my direct findings during this shared patient visit. Please see the separate note by Dr. Lisbeth Renshaw on this date for the remainder of the patient's plan of care.    Carola Rhine, [facility]   **Disclaimer: This note was dictated with voice recognition software. Similar sounding words can inadvertently be transcribed and this note may contain transcription errors which may not have been corrected upon publication of note.**

## 2021-08-02 ENCOUNTER — Inpatient Hospital Stay: Payer: 59

## 2021-08-02 ENCOUNTER — Other Ambulatory Visit: Payer: Self-pay | Admitting: Hematology and Oncology

## 2021-08-02 ENCOUNTER — Encounter: Payer: Self-pay | Admitting: Hematology and Oncology

## 2021-08-02 ENCOUNTER — Ambulatory Visit
Admission: RE | Admit: 2021-08-02 | Discharge: 2021-08-02 | Disposition: A | Discharge status: Home / Self Care | Payer: 59 | Source: Ambulatory Visit | Attending: Radiation Oncology | Admitting: Radiation Oncology

## 2021-08-02 ENCOUNTER — Other Ambulatory Visit: Payer: Self-pay

## 2021-08-02 ENCOUNTER — Telehealth: Payer: Self-pay | Admitting: Hematology and Oncology

## 2021-08-02 DIAGNOSIS — C787 Secondary malignant neoplasm of liver and intrahepatic bile duct: Secondary | ICD-10-CM | POA: Diagnosis not present

## 2021-08-02 DIAGNOSIS — C7949 Secondary malignant neoplasm of other parts of nervous system: Secondary | ICD-10-CM | POA: Diagnosis not present

## 2021-08-02 DIAGNOSIS — Z170 Estrogen receptor positive status [ER+]: Secondary | ICD-10-CM

## 2021-08-02 DIAGNOSIS — C50412 Malignant neoplasm of upper-outer quadrant of left female breast: Secondary | ICD-10-CM

## 2021-08-02 DIAGNOSIS — R180 Malignant ascites: Secondary | ICD-10-CM | POA: Diagnosis not present

## 2021-08-02 LAB — CMP (CANCER CENTER ONLY)
ALT: 43 U/L (ref 0–44)
AST: 127 U/L — ABNORMAL HIGH (ref 15–41)
Albumin: 2.9 g/dL — ABNORMAL LOW (ref 3.5–5.0)
Alkaline Phosphatase: 141 U/L — ABNORMAL HIGH (ref 38–126)
Anion gap: 8 (ref 5–15)
BUN: 13 mg/dL (ref 8–23)
CO2: 23 mmol/L (ref 22–32)
Calcium: 14 mg/dL (ref 8.9–10.3)
Chloride: 96 mmol/L — ABNORMAL LOW (ref 98–111)
Creatinine: 0.84 mg/dL (ref 0.44–1.00)
GFR, Estimated: >60 mL/min (ref >60)
Glucose, Bld: 128 mg/dL — ABNORMAL HIGH (ref 70–99)
Potassium: 3.7 mmol/L (ref 3.5–5.1)
Sodium: 127 mmol/L — ABNORMAL LOW (ref 135–145)
Total Bilirubin: 2.3 mg/dL — ABNORMAL HIGH (ref 0.3–1.2)
Total Protein: 7.2 g/dL (ref 6.5–8.1)

## 2021-08-02 LAB — CBC WITH DIFFERENTIAL (CANCER CENTER ONLY)
Abs Immature Granulocytes: 0.12 10*3/uL — ABNORMAL HIGH (ref 0.00–0.07)
Basophils Absolute: 0.2 10*3/uL — ABNORMAL HIGH (ref 0.0–0.1)
Basophils Relative: 1 %
Eosinophils Absolute: 0.1 10*3/uL (ref 0.0–0.5)
Eosinophils Relative: 1 %
HCT: 29.7 % — ABNORMAL LOW (ref 36.0–46.0)
Hemoglobin: 9.5 g/dL — ABNORMAL LOW (ref 12.0–15.0)
Immature Granulocytes: 1 %
Lymphocytes Relative: 14 %
Lymphs Abs: 2.8 10*3/uL (ref 0.7–4.0)
MCH: 28.7 pg (ref 26.0–34.0)
MCHC: 32 g/dL (ref 30.0–36.0)
MCV: 89.7 fL (ref 80.0–100.0)
Monocytes Absolute: 1.5 10*3/uL — ABNORMAL HIGH (ref 0.1–1.0)
Monocytes Relative: 8 %
Neutro Abs: 15.1 10*3/uL — ABNORMAL HIGH (ref 1.7–7.7)
Neutrophils Relative %: 75 %
Platelet Count: 314 10*3/uL (ref 150–400)
RBC: 3.31 MIL/uL — ABNORMAL LOW (ref 3.87–5.11)
RDW: 16.8 % — ABNORMAL HIGH (ref 11.5–15.5)
WBC Count: 19.8 10*3/uL — ABNORMAL HIGH (ref 4.0–10.5)
nRBC: 0.2 % (ref 0.0–0.2)

## 2021-08-02 MED ORDER — ZOLEDRONIC ACID 4 MG/100ML IV SOLN: 4.0000 mg | Freq: Once | INTRAVENOUS | Status: DC

## 2021-08-02 MED ORDER — SODIUM CHLORIDE 0.9 % IV SOLN: Freq: Once | INTRAVENOUS | Status: AC

## 2021-08-02 MED ORDER — DENOSUMAB 120 MG/1.7ML ~~LOC~~ SOLN
120.0000 mg | Freq: Once | SUBCUTANEOUS | Status: AC
  Administered 2021-08-02: 120 mg via SUBCUTANEOUS
  Filled 2021-08-02: qty 1.7

## 2021-08-02 NOTE — Progress Notes (Signed)
Per Dr. Lindi Adie, ok to treat with Bilirubin 2.3 and Calcium 14.0

## 2021-08-02 NOTE — Progress Notes (Signed)
CRITICAL VALUE STICKER  CRITICAL VALUE: Calcium 14  RECEIVER (on-site recipient of call): Vivian NOTIFIED: 1:00pm 1/20  MESSENGER (representative from lab): Verdis Frederickson  MD NOTIFIED: Yes  TIME OF NOTIFICATION: 1:00pm  RESPONSE:  MD notified, verbalized understanding. No orders received at this time.

## 2021-08-02 NOTE — Telephone Encounter (Signed)
Metastatic Breast cancer induced hypercalcemia: refractory to multile doses of Zometa. Todays calcium is 14. After discussion with pharmacy, we felt that Delton See might have a faster onset of action. Patient is symptomatic. Requesting authorization

## 2021-08-02 NOTE — Patient Instructions (Signed)
Denosumab injection What is this medication? DENOSUMAB (den oh sue mab) slows bone breakdown. Prolia is used to treat osteoporosis in women after menopause and in men, and in people who are taking corticosteroids for 6 months or more. Delton See is used to treat a high calcium level due to cancer and to prevent bone fractures and other bone problems caused by multiple myeloma or cancer bone metastases. Delton See is also used to treat giant cell tumor of the bone. This medicine may be used for other purposes; ask your health care provider or pharmacist if you have questions. COMMON BRAND NAME(S): Prolia, XGEVA What should I tell my care team before I take this medication? They need to know if you have any of these conditions: dental disease having surgery or tooth extraction infection kidney disease low levels of calcium or Vitamin D in the blood malnutrition on hemodialysis skin conditions or sensitivity thyroid or parathyroid disease an unusual reaction to denosumab, other medicines, foods, dyes, or preservatives pregnant or trying to get pregnant breast-feeding How should I use this medication? This medicine is for injection under the skin. It is given by a health care professional in a hospital or clinic setting. A special MedGuide will be given to you before each treatment. Be sure to read this information carefully each time. For Prolia, talk to your pediatrician regarding the use of this medicine in children. Special care may be needed. For Delton See, talk to your pediatrician regarding the use of this medicine in children. While this drug may be prescribed for children as young as 13 years for selected conditions, precautions do apply. Overdosage: If you think you have taken too much of this medicine contact a poison control center or emergency room at once. NOTE: This medicine is only for you. Do not share this medicine with others. What if I miss a dose? It is important not to miss your dose.  Call your doctor or health care professional if you are unable to keep an appointment. What may interact with this medication? Do not take this medicine with any of the following medications: other medicines containing denosumab This medicine may also interact with the following medications: medicines that lower your chance of fighting infection steroid medicines like prednisone or cortisone This list may not describe all possible interactions. Give your health care provider a list of all the medicines, herbs, non-prescription drugs, or dietary supplements you use. Also tell them if you smoke, drink alcohol, or use illegal drugs. Some items may interact with your medicine. What should I watch for while using this medication? Visit your doctor or health care professional for regular checks on your progress. Your doctor or health care professional may order blood tests and other tests to see how you are doing. Call your doctor or health care professional for advice if you get a fever, chills or sore throat, or other symptoms of a cold or flu. Do not treat yourself. This drug may decrease your body's ability to fight infection. Try to avoid being around people who are sick. You should make sure you get enough calcium and vitamin D while you are taking this medicine, unless your doctor tells you not to. Discuss the foods you eat and the vitamins you take with your health care professional. See your dentist regularly. Brush and floss your teeth as directed. Before you have any dental work done, tell your dentist you are receiving this medicine. Do not become pregnant while taking this medicine or for 5 months after  stopping it. Talk with your doctor or health care professional about your birth control options while taking this medicine. Women should inform their doctor if they wish to become pregnant or think they might be pregnant. There is a potential for serious side effects to an unborn child. Talk to  your health care professional or pharmacist for more information. What side effects may I notice from receiving this medication? Side effects that you should report to your doctor or health care professional as soon as possible: allergic reactions like skin rash, itching or hives, swelling of the face, lips, or tongue bone pain breathing problems dizziness jaw pain, especially after dental work redness, blistering, peeling of the skin signs and symptoms of infection like fever or chills; cough; sore throat; pain or trouble passing urine signs of low calcium like fast heartbeat, muscle cramps or muscle pain; pain, tingling, numbness in the hands or feet; seizures unusual bleeding or bruising unusually weak or tired Side effects that usually do not require medical attention (report to your doctor or health care professional if they continue or are bothersome): constipation diarrhea headache joint pain loss of appetite muscle pain runny nose tiredness upset stomach This list may not describe all possible side effects. Call your doctor for medical advice about side effects. You may report side effects to FDA at 1-800-FDA-1088. Where should I keep my medication? This medicine is only given in a clinic, doctor's office, or other health care setting and will not be stored at home. NOTE: This sheet is a summary. It may not cover all possible information. If you have questions about this medicine, talk to your doctor, pharmacist, or health care provider.  2022 Elsevier/Gold Standard (2017-11-06 00:00:00)

## 2021-08-02 NOTE — Progress Notes (Signed)
Per Lindi Adie MD, pt will receive Xgeva for treatment of calcium of 14.0. Pharmacy and prior authorization aware.

## 2021-08-05 ENCOUNTER — Encounter: Payer: Self-pay | Admitting: Adult Health

## 2021-08-05 ENCOUNTER — Inpatient Hospital Stay: Payer: 59 | Admitting: Adult Health

## 2021-08-05 ENCOUNTER — Other Ambulatory Visit: Payer: Self-pay

## 2021-08-05 ENCOUNTER — Ambulatory Visit: Payer: 59

## 2021-08-05 ENCOUNTER — Encounter: Payer: Self-pay | Admitting: *Deleted

## 2021-08-05 ENCOUNTER — Inpatient Hospital Stay: Payer: 59

## 2021-08-05 VITALS — BP 107/57 | HR 85 | Temp 97.9°F | Resp 18 | Ht 66.0 in | Wt 177.2 lb

## 2021-08-05 DIAGNOSIS — C50412 Malignant neoplasm of upper-outer quadrant of left female breast: Secondary | ICD-10-CM

## 2021-08-05 DIAGNOSIS — C787 Secondary malignant neoplasm of liver and intrahepatic bile duct: Secondary | ICD-10-CM | POA: Diagnosis not present

## 2021-08-05 DIAGNOSIS — Z170 Estrogen receptor positive status [ER+]: Secondary | ICD-10-CM

## 2021-08-05 LAB — CMP (CANCER CENTER ONLY)
ALT: 44 U/L (ref 0–44)
AST: 127 U/L — ABNORMAL HIGH (ref 15–41)
Albumin: 2.9 g/dL — ABNORMAL LOW (ref 3.5–5.0)
Alkaline Phosphatase: 147 U/L — ABNORMAL HIGH (ref 38–126)
Anion gap: 10 (ref 5–15)
BUN: 14 mg/dL (ref 8–23)
CO2: 22 mmol/L (ref 22–32)
Calcium: 13.4 mg/dL (ref 8.9–10.3)
Chloride: 94 mmol/L — ABNORMAL LOW (ref 98–111)
Creatinine: 1.12 mg/dL — ABNORMAL HIGH (ref 0.44–1.00)
GFR, Estimated: 56 mL/min — ABNORMAL LOW (ref >60)
Glucose, Bld: 111 mg/dL — ABNORMAL HIGH (ref 70–99)
Potassium: 3.6 mmol/L (ref 3.5–5.1)
Sodium: 126 mmol/L — ABNORMAL LOW (ref 135–145)
Total Bilirubin: 2.5 mg/dL — ABNORMAL HIGH (ref 0.3–1.2)
Total Protein: 7.3 g/dL (ref 6.5–8.1)

## 2021-08-05 LAB — CBC WITH DIFFERENTIAL (CANCER CENTER ONLY)
Abs Immature Granulocytes: 0.08 10*3/uL — ABNORMAL HIGH (ref 0.00–0.07)
Basophils Absolute: 0.2 10*3/uL — ABNORMAL HIGH (ref 0.0–0.1)
Basophils Relative: 1 %
Eosinophils Absolute: 0.1 10*3/uL (ref 0.0–0.5)
Eosinophils Relative: 0 %
HCT: 29.1 % — ABNORMAL LOW (ref 36.0–46.0)
Hemoglobin: 9.3 g/dL — ABNORMAL LOW (ref 12.0–15.0)
Immature Granulocytes: 1 %
Lymphocytes Relative: 18 %
Lymphs Abs: 3.1 10*3/uL (ref 0.7–4.0)
MCH: 28.4 pg (ref 26.0–34.0)
MCHC: 32 g/dL (ref 30.0–36.0)
MCV: 89 fL (ref 80.0–100.0)
Monocytes Absolute: 1.2 10*3/uL — ABNORMAL HIGH (ref 0.1–1.0)
Monocytes Relative: 7 %
Neutro Abs: 12.3 10*3/uL — ABNORMAL HIGH (ref 1.7–7.7)
Neutrophils Relative %: 73 %
Platelet Count: 321 10*3/uL (ref 150–400)
RBC: 3.27 MIL/uL — ABNORMAL LOW (ref 3.87–5.11)
RDW: 16.7 % — ABNORMAL HIGH (ref 11.5–15.5)
WBC Count: 17 10*3/uL — ABNORMAL HIGH (ref 4.0–10.5)
nRBC: 0 % (ref 0.0–0.2)

## 2021-08-05 MED ORDER — SODIUM CHLORIDE 0.9 % IV SOLN: INTRAVENOUS | Status: DC

## 2021-08-05 MED ORDER — SODIUM CHLORIDE 0.9 % IV SOLN: INTRAVENOUS | Status: AC

## 2021-08-05 NOTE — Patient Instructions (Signed)

## 2021-08-05 NOTE — Progress Notes (Signed)
Per Wilber Bihari, NP patient is only to receive IV fluids today.

## 2021-08-05 NOTE — Progress Notes (Signed)
CRITICAL VALUE STICKER  CRITICAL VALUE: Calcium 13.4  RECEIVER (on-site recipient of call): Christine Francis and Christine Francis NOTIFIED: 1/23 @ 12:30  MD NOTIFIED: Wilber Bihari

## 2021-08-05 NOTE — Progress Notes (Signed)
[facility] Follow up:    Christine Posey, MD [address] [place] [address]   DIAGNOSIS:  Cancer Staging  Liver metastases [facility]) Staging form: Liver, AJCC 8th Edition - Pathologic: Stage IVB (pM1) - Signed by Gardenia Phlegm, NP on 08/05/2021  Malignant neoplasm of upper-outer quadrant of left breast in female, estrogen receptor positive ([place]) Staging form: Breast, AJCC 8th Edition - Pathologic stage from 02/07/2019: Stage IA (pT2, pN0, cM0, G2, ER+, PR+, HER2-, Oncotype DX score: 24) - Signed by Gardenia Phlegm, NP on 03/02/2019 Stage prefix: Initial diagnosis Multigene prognostic tests performed: Oncotype DX Recurrence score range: Greater than or equal to 11 Histologic grading system: 3 grade system   SUMMARY OF ONCOLOGIC HISTORY: Oncology History  Malignant neoplasm of upper-outer quadrant of left breast in female, estrogen receptor positive ([place])  07/19/2014 Initial Biopsy   Rt.Breast Biopsy: LCIS   11/29/2014 Surgery   Right lumpectomy: LCIS with fibrocystic changes with usual ductal hyperplasia,PASH, 0.8 cm margins for LCIS   02/06/2015 -  Anti-estrogen oral therapy   tamoxifen 20 mg daily   12/08/2018 Relapse/Recurrence   Left breast 2 cm mass at 7 o clock position: ILC grade 2, ER 90%, PR 30%, Her 2 neg, Ki 67: 15% T1CN0 stage 1A   02/07/2019 Surgery   Bilateral mastectomies with reconstruction (Tsuei, [place]) Right breast: atypical lobular hyperplasia and no evidence of carcinoma in 1 lymph node. Left breast: invasive lobular carcinoma with LCIS, grade 2, 2.1cm, 2 lymph nodes negative for carcinoma, and invasive carcinoma broadly present at the anterior margin.    02/07/2019 Cancer Staging   Staging form: Breast, AJCC 8th Edition - Pathologic stage from 02/07/2019: Stage IA (pT2, pN0, cM0, G2, ER+, PR+, HER2-, Oncotype DX score: 24) - Signed by Gardenia Phlegm, NP on 03/02/2019    02/07/2019 Oncotype  testing   24/10%; no benefit from chemo   02/2019 -  Anti-estrogen oral therapy   Anastrozole daily switched to letrozole 09/26/2019   06/28/2021 Progression   CT parathyroid: 06/28/2021: 9 mm soft tissue nodule possible parathyroid adenoma, 6 mm left lobe of the thyroid nodule PET CT scan 07/05/2021: Diffuse hepatic hypermetabolism concerning for liver metastases versus hepatocellular carcinoma, hypermetabolic left axillary lymph node suspicious for metastatic disease, heterogeneous bone marrow activity nonspecific, portal vein hypertension   07/19/2021 Initial Biopsy   Liver needle core biopsy: metastatic carcinoma, compatible with breast primary.  ER 95%+, PR negative, Ki-67 15%, HER-2 equivocal by IHC, FISH negative.     Liver metastases ([place])  06/28/2021 Progression   CT parathyroid: 06/28/2021: 9 mm soft tissue nodule possible parathyroid adenoma, 6 mm left lobe of the thyroid nodule PET CT scan 07/05/2021: Diffuse hepatic hypermetabolism concerning for liver metastases versus hepatocellular carcinoma, hypermetabolic left axillary lymph node suspicious for metastatic disease, heterogeneous bone marrow activity nonspecific, portal vein hypertension   07/19/2021 Initial Biopsy   Liver needle core biopsy: metastatic carcinoma, compatible with breast primary.  ER 95%+, PR negative, Ki-67 15%, HER-2 equivocal by IHC, FISH negative.     07/31/2021 Initial Diagnosis   Liver metastases ([place])   08/05/2021 Cancer Staging   Staging form: Liver, AJCC 8th Edition - Pathologic: Stage IVB (pM1) - Signed by Gardenia Phlegm, NP on 08/05/2021      CURRENT THERAPY: Fulvestrant/Abemaciclib  INTERVAL HISTORY: Christine Francis 61 y.o. female returns for evaluation of her metastatic breast cancer.  She was last here on Friday and received Xgeva due to  hypercalcemia that was refractory to Zometa.  Her calcium had increased from 13.7 to 14.  On January 19 she underwent ultrasound-guided paracentesis  which yielded 4.2 L of peritoneal fluid.  On January 18 she underwent MRI of the brain for vision change that it began while she was in the hospital 1 week prior.  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  She was seen in radiation oncology by Dr. Lisbeth Renshaw and they have plan to deliver 2 weeks of radiotherapy to the left orbit.  Christine Francis notes that she feels a little bit dry today.  She says that she has been urinating a lot and has been having 2-3 bowel movements per day.  She is taking the Verzenio 50 mg twice daily with good tolerance.  She is mildly fatigued.  She denies any mucositis.  She received the fulvestrant last week and tolerated this well.  Patient Active Problem List   Diagnosis Date Noted   Liver metastases ([place]) 07/31/2021   Ascites 07/31/2021   Genetic testing 07/30/2021   Severe sepsis with acute organ dysfunction ([place]) 07/22/2021   Hypercalcemia 06/14/2021   S/P bilateral mastectomy 02/07/2019   Pain in right ankle and joints of right foot 12/23/2017   Chronic pain of right knee 12/23/2017   Anxiety 03/27/2015   Malignant neoplasm of upper-outer quadrant of left breast in female, estrogen receptor positive ([place]) 09/04/2014   Fibrocystic breast changes 07/04/2011   Atypical hyperplasia of breast excised R Breast may 2011 bc pills stopped 07/04/2011   Personal history of colonic adenomas 11/17/2005    is allergic to ampicillin, dilaudid [hydromorphone hcl], erythromycin, penicillins, sulfa drugs cross reactors, and declomycin [demeclocycline].  MEDICAL HISTORY: Past Medical History:  Diagnosis Date   Allergy    allergy shots weekly   Anxiety    no meds currently   Asthma    Breast cancer ([place])    left - LCIS - bilat mastec   Cancer ([place])    skin- basil cancer face, chest and left leg--   GERD (gastroesophageal reflux disease)    Hyperlipidemia    diet controlled, no meds   Hypertension    IBS (irritable bowel syndrome)    no problems  currently   Personal history of colonic adenomas 11/17/2005   11/17/2005 - 3 adenomas - one  A TV adenoma was 12 mm (max)    SURGICAL HISTORY: Past Surgical History:  Procedure Laterality Date   BIOPSY BREAST  2000   right; benign   BREAST BIOPSY  2012   right breast; pre cancerous   BREAST LUMPECTOMY Right 04/1999, 11/2009   BREAST LUMPECTOMY WITH RADIOACTIVE SEED LOCALIZATION Right 11/29/2014   Procedure: BREAST LUMPECTOMY WITH RADIOACTIVE SEED LOCALIZATION;  Surgeon: Donnie Mesa, MD;  Location: [facility];  Service: General;  Laterality: Right;   BREAST RECONSTRUCTION WITH PLACEMENT OF TISSUE EXPANDER AND ALLODERM Bilateral 02/07/2019   Procedure: BILATERAL BREAST RECONSTRUCTION WITH PLACEMENT OF TISSUE EXPANDER AND ALLODERM;  Surgeon: [name], MD;  Location: [facility];  Service: Plastics;  Laterality: Bilateral;   BREAST RECONSTRUCTION WITH PLACEMENT OF TISSUE EXPANDER AND ALLODERM Left 05/24/2019   Procedure: BREAST RECONSTRUCTION WITH PLACEMENT OF TISSUE EXPANDER AND ALLODERM;  Surgeon: [name], MD;  Location: [facility];  Service: Plastics;  Laterality: Left;   BUNIONECTOMY  1986   bilateral   cervical cryosurgery  12/2013   COLONOSCOPY     x 2   DIAGNOSTIC LAPAROSCOPY     IR  ANGIOGRAM SELECTIVE EACH ADDITIONAL VESSEL  07/22/2021   IR ANGIOGRAM SELECTIVE EACH ADDITIONAL VESSEL  07/22/2021   IR ANGIOGRAM SELECTIVE EACH ADDITIONAL VESSEL  07/22/2021   IR ANGIOGRAM VISCERAL SELECTIVE  07/22/2021   IR EMBO ART  VEN HEMORR LYMPH EXTRAV  INC GUIDE ROADMAPPING  07/22/2021   IR US GUIDE VASC ACCESS RIGHT  07/22/2021   laproscopy  1998   LIPOSUCTION Bilateral 01/31/2020   Procedure: LIPOSUCTION BILATERAL LATERAL CHEST WALL;  Surgeon: [name], MD;  Location: [facility];  Service: Plastics;  Laterality: Bilateral;   MASTECTOMY W/ SENTINEL NODE BIOPSY Bilateral 02/07/2019   Procedure: BILATERAL MASTECTOMIES WITH LEFT SENTINEL  LYMPH NODE BIOPSY;  Surgeon: Donnie Mesa, MD;  Location: [facility];  Service: General;  Laterality: Bilateral;  PEC BLOCK   PARATHYROIDECTOMY  2005   POLYPECTOMY     RE-EXCISION OF BREAST LUMPECTOMY Left 02/24/2019   Procedure: RE-EXCISION LEFT MASTECTOMY- LEFT DERMAL FLAP;  Surgeon: Donnie Mesa, MD;  Location: [facility];  Service: General;  Laterality: Left;   REMOVAL OF BILATERAL TISSUE EXPANDERS WITH PLACEMENT OF BILATERAL BREAST IMPLANTS Bilateral 01/31/2020   Procedure: REMOVAL OF BILATERAL TISSUE EXPANDERS WITH PLACEMENT OF BILATERAL SILICONE BREAST IMPLANTS;  Surgeon: [name], MD;  Location: [facility];  Service: Plastics;  Laterality: Bilateral;   REMOVAL OF TISSUE EXPANDER AND PLACEMENT OF IMPLANT Left 02/24/2019   Procedure: REMOVAL OF LEFT CHEST TISSUE EXPANDER WITH ALLODERM;  Surgeon: [name], MD;  Location: [facility];  Service: Plastics;  Laterality: Left;   TISSUE EXPANDER FILLING Right 02/24/2019   Procedure: RIGHT CHEST TISSUE EXPANDER FILLING;  Surgeon: [name], MD;  Location: [facility];  Service: Plastics;  Laterality: Right;  (441m 0.9% Normal Saline)   uterine bx     thickening of uterus was the reason for havin bx   WISDOM TOOTH EXTRACTION      SOCIAL HISTORY: Social History   Socioeconomic History   Marital status: Single    Spouse name: Not on file   Number of children: Not on file   Years of education: Not on file   Highest education level: Not on file  Occupational History   Occupation: PProbation officer [employer]   Employer: [employer]  Tobacco Use   Smoking status: Never   Smokeless tobacco: Never  Vaping Use   Vaping Use: Never used  Substance and Sexual Activity   Alcohol use: Yes    Alcohol/week: 1.0 standard drink    Types: 1 Glasses of wine per week    Comment: social   Drug use: No   Sexual activity: Yes    Birth control/protection:  Post-menopausal  Other Topics Concern   Not on file  Social History Narrative   Not on file   Social Determinants of Health   Financial Resource Strain: Not on file  Food Insecurity: Not on file  Transportation Needs: Not on file  Physical Activity: Not on file  Stress: Not on file  Social Connections: Not on file  Intimate Partner Violence: Not on file    FAMILY HISTORY: Family History  Problem Relation Age of Onset   Hypertension Father    Cancer Father        melanoma/skin cancer   Colon polyps Father    Colon cancer Neg Hx    Rectal cancer Neg Hx    Stomach cancer Neg Hx    Esophageal cancer Neg Hx  Review of Systems  Constitutional:  Positive for fatigue. Negative for appetite change, chills, fever and unexpected weight change.  HENT:   Negative for hearing loss, lump/mass and trouble swallowing.   Eyes:  Negative for eye problems and icterus.  Respiratory:  Negative for chest tightness, cough and shortness of breath.   Cardiovascular:  Negative for chest pain, leg swelling and palpitations.  Gastrointestinal:  Negative for abdominal distention, abdominal pain, constipation, diarrhea, nausea and vomiting.  Endocrine: Negative for hot flashes.  Genitourinary:  Negative for difficulty urinating.   Musculoskeletal:  Negative for arthralgias.  Skin:  Negative for itching and rash.  Neurological:  Negative for dizziness, extremity weakness, headaches and numbness.  Hematological:  Negative for adenopathy. Does not bruise/bleed easily.  Psychiatric/Behavioral:  Negative for depression. The patient is not nervous/anxious.      PHYSICAL EXAMINATION  ECOG PERFORMANCE STATUS: 1 - Symptomatic but completely ambulatory  Vitals:   08/05/21 1248  BP: (!) 107/57  Pulse: 85  Resp: 18  Temp: 97.9 F (36.6 C)  SpO2: 99%    Physical Exam Constitutional:      General: She is not in acute distress.    Appearance: Normal appearance. She is not toxic-appearing.  HENT:      Head: Normocephalic and atraumatic.  Eyes:     General: No scleral icterus. Cardiovascular:     Rate and Rhythm: Normal rate and regular rhythm.     Pulses: Normal pulses.     Heart sounds: Normal heart sounds.  Pulmonary:     Effort: Pulmonary effort is normal.     Breath sounds: Normal breath sounds.  Abdominal:     General: Abdomen is flat. Bowel sounds are normal. There is no distension.     Palpations: Abdomen is soft.     Tenderness: There is no abdominal tenderness.  Musculoskeletal:        General: No swelling.     Cervical back: Neck supple.  Lymphadenopathy:     Cervical: No cervical adenopathy.  Skin:    General: Skin is warm and dry.     Findings: No rash.  Neurological:     General: No focal deficit present.     Mental Status: She is alert.  Psychiatric:        Mood and Affect: Mood normal.        Behavior: Behavior normal.    LABORATORY DATA:  CBC    Component Value Date/Time   WBC 17.0 (H) 08/05/2021 1218   WBC 17.9 (H) 07/27/2021 0503   RBC 3.27 (L) 08/05/2021 1218   HGB 9.3 (L) 08/05/2021 1218   HGB 13.8 03/27/2015 0937   HCT 29.1 (L) 08/05/2021 1218   HCT 41.0 03/27/2015 0937   PLT 321 08/05/2021 1218   PLT 214 03/27/2015 0937   MCV 89.0 08/05/2021 1218   MCV 85.6 03/27/2015 0937   MCH 28.4 08/05/2021 1218   MCHC 32.0 08/05/2021 1218   RDW 16.7 (H) 08/05/2021 1218   RDW 13.4 03/27/2015 0937   LYMPHSABS 3.1 08/05/2021 1218   LYMPHSABS 2.0 03/27/2015 0937   MONOABS 1.2 (H) 08/05/2021 1218   MONOABS 0.4 03/27/2015 0937   EOSABS 0.1 08/05/2021 1218   EOSABS 0.2 03/27/2015 0937   BASOSABS 0.2 (H) 08/05/2021 1218   BASOSABS 0.1 03/27/2015 0937    CMP     Component Value Date/Time   NA 126 (L) 08/05/2021 1218   NA 141 03/27/2015 0937   K 3.6 08/05/2021 1218   K 3.4 (  L) 03/27/2015 0937   CL 94 (L) 08/05/2021 1218   CO2 22 08/05/2021 1218   CO2 29 03/27/2015 0937   GLUCOSE 111 (H) 08/05/2021 1218   GLUCOSE 137 03/27/2015 0937   BUN  14 08/05/2021 1218   BUN 16.2 03/27/2015 0937   CREATININE 1.12 (H) 08/05/2021 1218   CREATININE 0.8 03/27/2015 0937   CALCIUM 13.4 (HH) 08/05/2021 1218   CALCIUM 12.0 (H) 06/28/2021 1346   CALCIUM 9.6 03/27/2015 0937   PROT 7.3 08/05/2021 1218   PROT 7.2 03/27/2015 0937   ALBUMIN 2.9 (L) 08/05/2021 1218   ALBUMIN 3.9 03/27/2015 0937   AST 127 (H) 08/05/2021 1218   AST 19 03/27/2015 0937   ALT 44 08/05/2021 1218   ALT 25 03/27/2015 0937   ALKPHOS 147 (H) 08/05/2021 1218   ALKPHOS 50 03/27/2015 0937   BILITOT 2.5 (H) 08/05/2021 1218   BILITOT 0.73 03/27/2015 0937   GFRNONAA 56 (L) 08/05/2021 1218   GFRAA >60 01/27/2020 1130      ASSESSMENT and THERAPY PLAN:   Malignant neoplasm of upper-outer quadrant of left breast in female, estrogen receptor positive ([place]) Right lumpectomy 11/29/2014: LCIS with fibrocystic changes with usual ductal hyperplasia,PASH, 0.8 cm margins for LCIS, took Tamoxifen from 02/07/15-12/24/18   12/08/18: Left breast 2 cm mass at 7 o clock position: ILC grade 2, ER 90%, PR 30%, Her 2 neg, Ki 67: 15% T1CN0 stage 1A   Anxiety: on Xanax   Treatment Plan:  1. Bilateral mastectomies (patient preference): 02/07/2019 Bilateral mastectomies with reconstruction (Ts[place]i, [place]) Right breast: atypical lobular hyperplasia and no evidence of carcinoma in 1 lymph node. Left breast: invasive lobular carcinoma with LCIS, grade 2, 2.1cm, 2 lymph nodes negative for carcinoma, and invasive carcinoma broadly present at the anterior margin (margin cleared 02/24/2019).  Stage IIa 2. Oncotype Dx: Score 24, distant recurrence at 9 years 10% (did not require chemo) 3. Adj Anastrozole started 03/01/2019 changed to letrozole 09/26/2019 discontinued with disease progression  CT CAP 06/12/2021: (Performed by her PCP for elevated liver enzymes): Enlarged cirrhotic appearing liver with portal venous collaterals and esophageal varices.  Thickened endometrium worrisome for endometrial  cancer.  Severe gastritis.  Borderline enlarged obturator lymph nodes  06/26/2021: Bone scan small foci of mild increased activity in frontal bones differential is hyperostosis versus metastases.  No other findings of metastatic disease. 06/28/2021: CT parathyroids is being done today 06/28/2021.  CT parathyroid: 06/28/2021: 9 mm soft tissue nodule possible parathyroid adenoma, 6 mm left lobe of the thyroid nodule PET CT scan 07/05/2021: Diffuse hepatic hypermetabolism concerning for liver metastases versus hepatocellular carcinoma, hypermetabolic left axillary lymph node suspicious for metastatic disease, heterogeneous bone marrow activity nonspecific, portal vein hypertension  Liver biopsy: Consistent with metastatic breast cancer estrogen positive, progesterone negative, HER2 negative. Hospital admission after liver biopsy, discharged on July 27, 2021  Recommendations: #1 fulvestrant given every 2 weeks x 3 doses then every 4 weeks thereafter. 2.  Abemaciclib 50 mg p.o. twice daily to start tomorrow 3.  Zometa for her hypercalcemia  _______________________________________________________________________________________________  #1 metastatic breast cancer to the liver: She has begun treatment with fulvestrant and Verzenio twice daily.  She is tolerating this well.  We will continue to monitor.  #2 hypercalcemia: This is slightly improved with her receiving Xgeva on Friday.  Her creatinine is slightly increased today.  We will give her 500 mL of normal saline IV.  #3  Orbital metastases.  She will begin palliative radiation tomorrow.  #4 malignant ascites: Paracenteses  as needed.  Her most recent tap was completed last week and yielded 4.2 L of fluid..   Christine Francis will return on Friday for labs.  She has follow-up on February 1 for labs and her next injection.   All questions were answered. The patient knows to call the clinic with any problems, questions or concerns. We can certainly  see the patient much sooner if necessary.  Total encounter time: 30 minutes in face-to-face visit time, chart review, lab review, order entry, care coordination, and documentation of the encounter.  Wilber Bihari, NP 08/05/21 1:41 PM Medical Oncology and H[facility] [place], [place] [address] Tel. [phone]    Fax. [phone]  *Total Encounter Time as defined by the Centers for Medicare and Medicaid Services includes, in addition to the face-to-face time of a patient visit (documented in the note above) non-face-to-face time: obtaining and reviewing outside history, ordering and reviewing medications, tests or procedures, care coordination (communications with other health care professionals or caregivers) and documentation in the medical record.

## 2021-08-05 NOTE — Assessment & Plan Note (Addendum)
Right lumpectomy 11/29/2014: LCIS with fibrocystic changes with usual ductal hyperplasia,PASH, 0.8 cm margins for LCIS, took Tamoxifen from 02/07/15-12/24/18  12/08/18:Left breast 2 cm mass at 7 o clock position: ILC grade 2, ER 90%, PR 30%, Her 2 neg, Ki 67: 15% T1CN0 stage 1A  Anxiety:onXanax  Treatment Plan:  1. Bilateral mastectomies (patient preference): 7/27/2020Bilateral mastectomies with reconstruction ([place], [place]) Right breast: atypical lobular hyperplasia and no evidence of carcinoma in 1 lymph node. Left breast: invasive lobular carcinoma with LCIS, grade 2, 2.1cm, 2 lymph nodes negative for carcinoma, and invasive carcinoma broadly present at the anterior margin(margin cleared 02/24/2019). Stage IIa 2. Oncotype Dx: Score 24, distant recurrence at 9 years 10% (did not require chemo) 3. Adj Anastrozolestarted 8/18/2020changed to letrozole 09/26/2019 discontinued with disease progression  CT CAP 06/12/2021: (Performed by her PCP for elevated liver enzymes): Enlarged cirrhotic appearing liver with portal venous collaterals and esophageal varices. Thickened endometrium worrisome for endometrial cancer. Severe gastritis. Borderline enlarged obturator lymph nodes  06/26/2021: Bone scan small foci of mild increased activity in frontal bones differential is hyperostosis versus metastases.  No other findings of metastatic disease. 06/28/2021: CT parathyroids is being done today 06/28/2021.  CT parathyroid: 06/28/2021: 9 mm soft tissue nodule possible parathyroid adenoma, 6 mm left lobe of the thyroid nodule PET CT scan 07/05/2021: Diffuse hepatic hypermetabolism concerning for liver metastases versus hepatocellular carcinoma, hypermetabolic left axillary lymph node suspicious for metastatic disease, heterogeneous bone marrow activity nonspecific, portal vein hypertension  Liver biopsy: Consistent with metastatic breast cancer estrogen positive, progesterone negative, HER2  negative. Hospital admission after liver biopsy, discharged on July 27, 2021  Recommendations: #1 fulvestrant given every 2 weeks x 3 doses then every 4 weeks thereafter. 2.  Abemaciclib 50 mg p.o. twice daily to start tomorrow 3.  Zometa for her hypercalcemia  _______________________________________________________________________________________________  #1 metastatic breast cancer to the liver: She has begun treatment with fulvestrant and Verzenio twice daily.  She is tolerating this well.  We will continue to monitor.  #2 hypercalcemia: This is slightly improved with her receiving Xgeva on Friday.  Her creatinine is slightly increased today.  We will give her 500 mL of normal saline IV.  #3  Orbital metastases.  She will begin palliative radiation tomorrow.  #4 malignant ascites: Paracenteses as needed.  Her most recent tap was completed last week and yielded 4.2 L of fluid..   De[place]re will return on Friday for labs.  She has follow-up on February 1 for labs and her next injection.

## 2021-08-06 ENCOUNTER — Ambulatory Visit: Admission: RE | Admit: 2021-08-06 | Payer: 59 | Source: Ambulatory Visit | Admitting: Radiation Oncology

## 2021-08-07 ENCOUNTER — Ambulatory Visit: Payer: 59

## 2021-08-07 ENCOUNTER — Ambulatory Visit: Payer: 59 | Admitting: Radiation Oncology

## 2021-08-07 DIAGNOSIS — C50412 Malignant neoplasm of upper-outer quadrant of left female breast: Secondary | ICD-10-CM | POA: Diagnosis not present

## 2021-08-07 NOTE — Progress Notes (Signed)
Patient Care Team: Molli Posey, MD as PCP - General (Obstetrics and Gynecology) [name], MD as Consulting Physician (Plastic Surgery) Donnie Mesa, MD as Consulting Physician (General Surgery) Nicholas Lose, MD as Consulting Physician (Hematology and Oncology)  DIAGNOSIS:    ICD-10-CM   1. Malignant neoplasm of upper-outer quadrant of left breast in female, estrogen receptor positive ([place])  C50.412    Z17.0       SUMMARY OF ONCOLOGIC HISTORY: Oncology History  Malignant neoplasm of upper-outer quadrant of left breast in female, estrogen receptor positive ([place])  07/19/2014 Initial Biopsy   Rt.Breast Biopsy: LCIS   11/29/2014 Surgery   Right lumpectomy: LCIS with fibrocystic changes with usual ductal hyperplasia,PASH, 0.8 cm margins for LCIS   02/06/2015 -  Anti-estrogen oral therapy   tamoxifen 20 mg daily   12/08/2018 Relapse/Recurrence   Left breast 2 cm mass at 7 o clock position: ILC grade 2, ER 90%, PR 30%, Her 2 neg, Ki 67: 15% T1CN0 stage 1A   02/07/2019 Surgery   Bilateral mastectomies with reconstruction ([facility], [place]) Right breast: atypical lobular hyperplasia and no evidence of carcinoma in 1 lymph node. Left breast: invasive lobular carcinoma with LCIS, grade 2, 2.1cm, 2 lymph nodes negative for carcinoma, and invasive carcinoma broadly present at the anterior margin.    02/07/2019 Cancer Staging   Staging form: Breast, AJCC 8th Edition - Pathologic stage from 02/07/2019: Stage IA (pT2, pN0, cM0, G2, ER+, PR+, HER2-, Oncotype DX score: 24) - Signed by Gardenia Phlegm, NP on 03/02/2019    02/07/2019 Oncotype testing   24/10%; no benefit from chemo   02/2019 -  Anti-estrogen oral therapy   Anastrozole daily switched to letrozole 09/26/2019   06/28/2021 Progression   CT parathyroid: 06/28/2021: 9 mm soft tissue nodule possible parathyroid adenoma, 6 mm left lobe of the thyroid nodule PET CT scan 07/05/2021: Diffuse hepatic hypermetabolism  concerning for liver metastases versus hepatocellular carcinoma, hypermetabolic left axillary lymph node suspicious for metastatic disease, heterogeneous bone marrow activity nonspecific, portal vein hypertension   07/19/2021 Initial Biopsy   Liver needle core biopsy: metastatic carcinoma, compatible with breast primary.  ER 95%+, PR negative, Ki-67 15%, HER-2 equivocal by IHC, FISH negative.     Liver metastases ([place])  06/28/2021 Progression   CT parathyroid: 06/28/2021: 9 mm soft tissue nodule possible parathyroid adenoma, 6 mm left lobe of the thyroid nodule PET CT scan 07/05/2021: Diffuse hepatic hypermetabolism concerning for liver metastases versus hepatocellular carcinoma, hypermetabolic left axillary lymph node suspicious for metastatic disease, heterogeneous bone marrow activity nonspecific, portal vein hypertension   07/19/2021 Initial Biopsy   Liver needle core biopsy: metastatic carcinoma, compatible with breast primary.  ER 95%+, PR negative, Ki-67 15%, HER-2 equivocal by IHC, FISH negative.     07/31/2021 Initial Diagnosis   Liver metastases ([place])   08/05/2021 Cancer Staging   Staging form: Liver, AJCC 8th Edition - Pathologic: Stage IVB (pM1) - Signed by Gardenia Phlegm, NP on 08/05/2021      CHIEF COMPLIANT: Follow-up of metastatic breast cancer  INTERVAL HISTORY: Christine Francis is a 61 y.o. with above-mentioned history of metastatic breast cancer. She presents to the clinic today for follow-up.  Patient is in a wheelchair and is complaining of worsening abdominal distention once again.  She is able to converse normally today.  However she is fairly slow.  Her performance status continues to remain low.  She has not been eating or drinking much.  She has no appetite and has  no interest in eating any food.  However she is forcing herself to drink some boost intermittently.  ALLERGIES:  is allergic to ampicillin, dilaudid [hydromorphone hcl], erythromycin, penicillins,  sulfa drugs cross reactors, and declomycin [demeclocycline].  MEDICATIONS:  Current Outpatient Medications  Medication Sig Dispense Refill   dexamethasone (DECADRON) 1 MG tablet Take 1 tablet (1 mg total) by mouth 2 (two) times daily with a meal. 30 tablet 3   abemaciclib (VERZENIO) 50 MG tablet Take 1 tablet (50 mg total) by mouth 2 (two) times daily. Swallow tablets whole. Do not chew, crush, or split tablets before swallowing. 60 tablet 1   albuterol (PROVENTIL HFA;VENTOLIN HFA) 108 (90 BASE) MCG/ACT inhaler Inhale 1-2 puffs into the lungs every 6 (six) hours as needed for wheezing or shortness of breath.      ALPRAZolam (XANAX) 0.5 MG tablet Take 0.5 mg by mouth daily as needed for anxiety.     atenolol (TENORMIN) 50 MG tablet Take 1 tablet (50 mg total) by mouth daily. 30 tablet 0   EPINEPHrine 0.3 mg/0.3 mL IJ SOAJ injection Inject 0.3 mg into the muscle once as needed for anaphylaxis.     fluticasone furoate-vilanterol (BREO ELLIPTA) 200-25 MCG/INH AEPB Inhale 1 puff into the lungs daily as needed (shortness of breath).     furosemide (LASIX) 40 MG tablet Take 1 tablet (40 mg total) by mouth 2 (two) times daily. 60 tablet 2   hydrochlorothiazide (HYDRODIURIL) 25 MG tablet Take 1 tablet (25 mg total) by mouth daily. 30 tablet 1   lactulose (CHRONULAC) 10 GM/15ML solution Take 30 mLs (20 g total) by mouth 3 (three) times daily. 946 mL 0   montelukast (SINGULAIR) 10 MG tablet Take 10 mg by mouth daily.      omeprazole (PRILOSEC OTC) 20 MG tablet Take 20 mg by mouth daily.     Phenylephrine-DM-GG-APAP (MUCINEX SINUS-MAX) 5-10-200-325 MG TABS Take 2 tablets by mouth daily as needed (congestion).     potassium chloride SA (KLOR-CON M) 20 MEQ tablet Take 2 tablets (40 mEq total) by mouth 2 (two) times daily. 120 tablet 1   spironolactone (ALDACTONE) 25 MG tablet Take 1 tablet (25 mg total) by mouth daily. 30 tablet 1   No current facility-administered medications for this visit.    PHYSICAL  EXAMINATION: ECOG PERFORMANCE STATUS: 3 - Symptomatic, >50% confined to bed  Vitals:   08/09/21 1001  BP: (!) 101/55  Pulse: 80  Resp: 18  Temp: (!) 97.5 F (36.4 C)  SpO2: 100%   Filed Weights   08/09/21 1001  Weight: 176 lb 4.8 oz (80 kg)   Abdomen: Abdominal distention massive hepatomegaly  LABORATORY DATA:  I have reviewed the data as listed CMP Latest Ref Rng & Units 08/09/2021 08/05/2021 08/02/2021  Glucose 70 - 99 mg/dL 106(H) 111(H) 128(H)  BUN 8 - 23 mg/dL 15 14 13   Creatinine 0.44 - 1.00 mg/dL 1.25(H) 1.12(H) 0.84  Sodium 135 - 145 mmol/L 127(L) 126(L) 127(L)  Potassium 3.5 - 5.1 mmol/L 3.4(L) 3.6 3.7  Chloride 98 - 111 mmol/L 94(L) 94(L) 96(L)  CO2 22 - 32 mmol/L 24 22 23   Calcium 8.9 - 10.3 mg/dL 13.6(HH) 13.4(HH) 14.0(HH)  Total Protein 6.5 - 8.1 g/dL 7.6 7.3 7.2  Total Bilirubin 0.3 - 1.2 mg/dL 2.5(H) 2.5(H) 2.3(H)  Alkaline Phos 38 - 126 U/L 167(H) 147(H) 141(H)  AST 15 - 41 U/L 121(H) 127(H) 127(H)  ALT 0 - 44 U/L 48(H) 44 43    Lab Results  Component Value Date   WBC 16.9 (H) 08/09/2021   HGB 9.9 (L) 08/09/2021   HCT 31.3 (L) 08/09/2021   MCV 90.2 08/09/2021   PLT 299 08/09/2021   NEUTROABS 12.1 (H) 08/09/2021    ASSESSMENT & PLAN:  Malignant neoplasm of upper-outer quadrant of left breast in female, estrogen receptor positive ([place]) Right lumpectomy 11/29/2014: LCIS with fibrocystic changes with usual ductal hyperplasia,PASH, 0.8 cm margins for LCIS, took Tamoxifen from 02/07/15-12/24/18   12/08/18: Left breast 2 cm mass at 7 o clock position: ILC grade 2, ER 90%, PR 30%, Her 2 neg, Ki 67: 15% T1CN0 stage 1A   Anxiety: on Xanax   Treatment summary:  1. Bilateral mastectomies (patient preference): 02/07/2019 Bilateral mastectomies with reconstruction ([place], [place]) Right breast: atypical lobular hyperplasia and no evidence of carcinoma in 1 lymph node. Left breast: invasive lobular carcinoma with LCIS, grade 2, 2.1cm, 2 lymph nodes negative for  carcinoma, and invasive carcinoma broadly present at the anterior margin (margin cleared 02/24/2019).  Stage IIa 2. Oncotype Dx: Score 24, distant recurrence at 9 years 10% (did not require chemo) 3. Adj Anastrozole started 03/01/2019 changed to letrozole 09/26/2019 discontinued with disease progression   CT CAP 06/12/2021: (Performed by her PCP for elevated liver enzymes): Enlarged cirrhotic appearing liver with portal venous collaterals and esophageal varices.  Thickened endometrium worrisome for endometrial cancer.  Severe gastritis.  Borderline enlarged obturator lymph nodes   06/26/2021: Bone scan small foci of mild increased activity in frontal bones differential is hyperostosis versus metastases.  No other findings of metastatic disease. 06/28/2021: CT parathyroids is being done today 06/28/2021.   CT parathyroid: 06/28/2021: 9 mm soft tissue nodule possible parathyroid adenoma, 6 mm left lobe of the thyroid nodule PET CT scan 07/05/2021: Diffuse hepatic hypermetabolism concerning for liver metastases versus hepatocellular carcinoma, hypermetabolic left axillary lymph node suspicious for metastatic disease, heterogeneous bone marrow activity nonspecific, portal vein hypertension   Liver biopsy: Consistent with metastatic breast cancer estrogen positive, progesterone negative, HER2 negative. Hospital admission after liver biopsy, discharged on July 27, 2021   Current treatment: #1 fulvestrant given every 2 weeks x 3 doses then every 4 weeks thereafter.  Started 07/31/2021 2.  Abemaciclib 50 mg p.o. twice daily started 08/05/2021 3.  Zometa/Xgeva for her hypercalcemia  _______________________________________________________________________________________________ Toxicities: 3 loose stools per day because of lactulose No adverse effects from fulvestrant or abemaciclib.  Severe hypercalcemia: Delton See will be given today.  Calcium is at 13.6. Orbital mass: Currently undergoing palliative  radiation Recurrent ascites: We will request to radiology for paracentesis again.  CODE STATUS: DNRCC-Arrest. Patient does not want [place] to do any intubation or chest compressions if she stops breathing or if her heart stops.  Plan to increase the dosage of abemaciclib to 100 mg p.o. twice daily with the next cycle I will see her next week to recheck her labs and for Xgeva.  We are very concerned about her overall prognosis because of the massive splenomegaly and recurrent ascites.   No orders of the defined types were placed in this encounter.  The patient has a good understanding of the overall plan. she agrees with it. she will call with any problems that may develop before the next visit here.  Total time spent: 30 mins including face to face time and time spent for planning, charting and coordination of care  Rulon Eisenmenger, MD, MPH 08/09/2021  I, Thana Ates, am acting as scribe for Dr. Nicholas Lose.  I have reviewed the above  documentation for accuracy and completeness, and I agree with the above.

## 2021-08-08 ENCOUNTER — Other Ambulatory Visit: Payer: Self-pay | Admitting: *Deleted

## 2021-08-08 ENCOUNTER — Ambulatory Visit: Payer: 59

## 2021-08-08 ENCOUNTER — Ambulatory Visit
Admission: RE | Admit: 2021-08-08 | Discharge: 2021-08-08 | Disposition: A | Discharge status: Home / Self Care | Payer: 59 | Source: Ambulatory Visit | Attending: Radiation Oncology | Admitting: Radiation Oncology

## 2021-08-08 DIAGNOSIS — C50412 Malignant neoplasm of upper-outer quadrant of left female breast: Secondary | ICD-10-CM | POA: Diagnosis not present

## 2021-08-08 DIAGNOSIS — Z170 Estrogen receptor positive status [ER+]: Secondary | ICD-10-CM

## 2021-08-09 ENCOUNTER — Encounter: Payer: Self-pay | Admitting: *Deleted

## 2021-08-09 ENCOUNTER — Other Ambulatory Visit: Payer: Self-pay

## 2021-08-09 ENCOUNTER — Inpatient Hospital Stay: Payer: 59

## 2021-08-09 ENCOUNTER — Inpatient Hospital Stay: Payer: 59 | Admitting: Hematology and Oncology

## 2021-08-09 ENCOUNTER — Other Ambulatory Visit: Payer: Self-pay | Admitting: *Deleted

## 2021-08-09 ENCOUNTER — Ambulatory Visit
Admission: RE | Admit: 2021-08-09 | Discharge: 2021-08-09 | Disposition: A | Discharge status: Home / Self Care | Payer: 59 | Source: Ambulatory Visit | Attending: Radiation Oncology | Admitting: Radiation Oncology

## 2021-08-09 DIAGNOSIS — C50412 Malignant neoplasm of upper-outer quadrant of left female breast: Secondary | ICD-10-CM

## 2021-08-09 DIAGNOSIS — Z170 Estrogen receptor positive status [ER+]: Secondary | ICD-10-CM

## 2021-08-09 DIAGNOSIS — R180 Malignant ascites: Secondary | ICD-10-CM

## 2021-08-09 LAB — CMP (CANCER CENTER ONLY)
ALT: 48 U/L — ABNORMAL HIGH (ref 0–44)
AST: 121 U/L — ABNORMAL HIGH (ref 15–41)
Albumin: 3 g/dL — ABNORMAL LOW (ref 3.5–5.0)
Alkaline Phosphatase: 167 U/L — ABNORMAL HIGH (ref 38–126)
Anion gap: 9 (ref 5–15)
BUN: 15 mg/dL (ref 8–23)
CO2: 24 mmol/L (ref 22–32)
Calcium: 13.6 mg/dL (ref 8.9–10.3)
Chloride: 94 mmol/L — ABNORMAL LOW (ref 98–111)
Creatinine: 1.25 mg/dL — ABNORMAL HIGH (ref 0.44–1.00)
GFR, Estimated: 49 mL/min — ABNORMAL LOW (ref >60)
Glucose, Bld: 106 mg/dL — ABNORMAL HIGH (ref 70–99)
Potassium: 3.4 mmol/L — ABNORMAL LOW (ref 3.5–5.1)
Sodium: 127 mmol/L — ABNORMAL LOW (ref 135–145)
Total Bilirubin: 2.5 mg/dL — ABNORMAL HIGH (ref 0.3–1.2)
Total Protein: 7.6 g/dL (ref 6.5–8.1)

## 2021-08-09 LAB — CBC WITH DIFFERENTIAL (CANCER CENTER ONLY)
Abs Immature Granulocytes: 0.07 10*3/uL (ref 0.00–0.07)
Basophils Absolute: 0.2 10*3/uL — ABNORMAL HIGH (ref 0.0–0.1)
Basophils Relative: 1 %
Eosinophils Absolute: 0.1 10*3/uL (ref 0.0–0.5)
Eosinophils Relative: 1 %
HCT: 31.3 % — ABNORMAL LOW (ref 36.0–46.0)
Hemoglobin: 9.9 g/dL — ABNORMAL LOW (ref 12.0–15.0)
Immature Granulocytes: 0 %
Lymphocytes Relative: 22 %
Lymphs Abs: 3.8 10*3/uL (ref 0.7–4.0)
MCH: 28.5 pg (ref 26.0–34.0)
MCHC: 31.6 g/dL (ref 30.0–36.0)
MCV: 90.2 fL (ref 80.0–100.0)
Monocytes Absolute: 0.7 10*3/uL (ref 0.1–1.0)
Monocytes Relative: 4 %
Neutro Abs: 12.1 10*3/uL — ABNORMAL HIGH (ref 1.7–7.7)
Neutrophils Relative %: 72 %
Platelet Count: 299 10*3/uL (ref 150–400)
RBC: 3.47 MIL/uL — ABNORMAL LOW (ref 3.87–5.11)
RDW: 17 % — ABNORMAL HIGH (ref 11.5–15.5)
WBC Count: 16.9 10*3/uL — ABNORMAL HIGH (ref 4.0–10.5)
nRBC: 0 % (ref 0.0–0.2)

## 2021-08-09 MED ORDER — DENOSUMAB 120 MG/1.7ML ~~LOC~~ SOLN
120.0000 mg | Freq: Once | SUBCUTANEOUS | Status: AC
  Administered 2021-08-09: 120 mg via SUBCUTANEOUS
  Filled 2021-08-09: qty 1.7

## 2021-08-09 MED ORDER — DEXAMETHASONE 1 MG PO TABS
1.0000 mg | ORAL_TABLET | Freq: Two times a day (BID) | ORAL | 3 refills | Status: AC
Start: 2021-08-09 — End: ?

## 2021-08-09 NOTE — Progress Notes (Signed)
Received HCPOA and Living Will from pt.  RN placed in HIM folder to place copy in the chart.

## 2021-08-09 NOTE — Assessment & Plan Note (Signed)
Right lumpectomy 11/29/2014: LCIS with fibrocystic changes with usual ductal hyperplasia,PASH, 0.8 cm margins for LCIS, took Tamoxifen from 02/07/15-12/24/18  12/08/18:Left breast 2 cm mass at 7 o clock position: ILC grade 2, ER 90%, PR 30%, Her 2 neg, Ki 67: 15% T1CN0 stage 1A  Anxiety:onXanax  Treatment summary:  1. Bilateral mastectomies (patient preference): 7/27/2020Bilateral mastectomies with reconstruction ([facility], [place]) Right breast: atypical lobular hyperplasia and no evidence of carcinoma in 1 lymph node. Left breast: invasive lobular carcinoma with LCIS, grade 2, 2.1cm, 2 lymph nodes negative for carcinoma, and invasive carcinoma broadly present at the anterior margin(margin cleared 02/24/2019). Stage IIa 2. Oncotype Dx: Score 24, distant recurrence at 9 years 10% (did not require chemo) 3. Adj Anastrozolestarted 8/18/2020changed to letrozole 09/26/2019 discontinued with disease progression  CT CAP 06/12/2021: (Performed by her PCP for elevated liver enzymes): Enlarged cirrhotic appearing liver with portal venous collaterals and esophageal varices. Thickened endometrium worrisome for endometrial cancer. Severe gastritis. Borderline enlarged obturator lymph nodes  06/26/2021: Bone scan small foci of mild increased activity in frontal bones differential is hyperostosis versus metastases. No other findings of metastatic disease. 06/28/2021: CT parathyroidsis being done today 06/28/2021.  CT parathyroid: 06/28/2021: 9 mm soft tissue nodule possible parathyroid adenoma, 6 mm left lobe of the thyroid nodule PET CT scan 07/05/2021: Diffuse hepatic hypermetabolism concerning for liver metastases versus hepatocellular carcinoma, hypermetabolic left axillary lymph node suspicious for metastatic disease, heterogeneous bone marrow activity nonspecific, portal vein hypertension  Liver biopsy: Consistent with metastatic breast cancer estrogen positive, progesterone negative,  HER2 negative. Hospital admission after liver biopsy, discharged on July 27, 2021  Current treatment: #1 fulvestrant given every 2 weeks x 3 doses then every 4 weeks thereafter.  Started 07/31/2021 2.  Abemaciclib 50 mg p.o. twice daily started 08/05/2021 3.  Zometa/Xgeva for her hypercalcemia _______________________________________________________________________________________________ Toxicities:  Plan to increase the dosage of abemaciclib 200 mg p.o. twice daily with the next cycle

## 2021-08-09 NOTE — Progress Notes (Signed)
Per MD request, orders placed for pt to receive paracentesis next week.

## 2021-08-09 NOTE — Progress Notes (Signed)
CRITICAL VALUE STICKER  CRITICAL VALUE: CA 13.6  RECEIVER (on-site recipient of call): Marlee Trentman,RN  DATE & TIME NOTIFIED: 08/09/21 at 1015  MD NOTIFIED: Nicholas Lose, MD  TIME OF NOTIFICATION: 08/09/21 at 1022  RESPONSE: MD notified, verbalized understanding.

## 2021-08-09 NOTE — Telephone Encounter (Signed)
Patient gave consent to discard genetics sample since already had genetic testing previously.

## 2021-08-12 ENCOUNTER — Ambulatory Visit
Admission: RE | Admit: 2021-08-12 | Discharge: 2021-08-12 | Disposition: A | Discharge status: Home / Self Care | Payer: 59 | Source: Ambulatory Visit | Attending: Radiation Oncology | Admitting: Radiation Oncology

## 2021-08-12 ENCOUNTER — Other Ambulatory Visit: Payer: Self-pay | Admitting: Hematology and Oncology

## 2021-08-12 ENCOUNTER — Other Ambulatory Visit: Payer: Self-pay

## 2021-08-12 ENCOUNTER — Ambulatory Visit (HOSPITAL_COMMUNITY)
Admission: RE | Admit: 2021-08-12 | Discharge: 2021-08-12 | Disposition: A | Discharge status: Home / Self Care | Payer: 59 | Source: Ambulatory Visit | Attending: Hematology and Oncology | Admitting: Hematology and Oncology

## 2021-08-12 DIAGNOSIS — R180 Malignant ascites: Secondary | ICD-10-CM

## 2021-08-12 DIAGNOSIS — C801 Malignant (primary) neoplasm, unspecified: Secondary | ICD-10-CM | POA: Insufficient documentation

## 2021-08-12 DIAGNOSIS — C50412 Malignant neoplasm of upper-outer quadrant of left female breast: Secondary | ICD-10-CM | POA: Diagnosis not present

## 2021-08-12 MED ORDER — LIDOCAINE HCL 1 % IJ SOLN
INTRAMUSCULAR | Status: AC
  Filled 2021-08-12: qty 20

## 2021-08-13 ENCOUNTER — Ambulatory Visit
Admission: RE | Admit: 2021-08-13 | Discharge: 2021-08-13 | Disposition: A | Discharge status: Home / Self Care | Payer: 59 | Source: Ambulatory Visit | Attending: Radiation Oncology | Admitting: Radiation Oncology

## 2021-08-13 DIAGNOSIS — C50412 Malignant neoplasm of upper-outer quadrant of left female breast: Secondary | ICD-10-CM | POA: Diagnosis not present

## 2021-08-13 NOTE — Progress Notes (Signed)
Patient Care Team: Molli Posey, MD as PCP - General (Obstetrics and Gynecology) [name], MD as Consulting Physician (Plastic Surgery) Donnie Mesa, MD as Consulting Physician (General Surgery) Nicholas Lose, MD as Consulting Physician (Hematology and Oncology)  DIAGNOSIS:    ICD-10-CM   1. Malignant neoplasm of upper-outer quadrant of left breast in female, estrogen receptor positive ([place])  C50.412 abemaciclib (VERZENIO) 100 MG tablet   Z17.0       SUMMARY OF ONCOLOGIC HISTORY: Oncology History  Malignant neoplasm of upper-outer quadrant of left breast in female, estrogen receptor positive ([place])  07/19/2014 Initial Biopsy   Rt.Breast Biopsy: LCIS   11/29/2014 Surgery   Right lumpectomy: LCIS with fibrocystic changes with usual ductal hyperplasia,PASH, 0.8 cm margins for LCIS   02/06/2015 -  Anti-estrogen oral therapy   tamoxifen 20 mg daily   12/08/2018 Relapse/Recurrence   Left breast 2 cm mass at 7 o clock position: ILC grade 2, ER 90%, PR 30%, Her 2 neg, Ki 67: 15% T1CN0 stage 1A   02/07/2019 Surgery   Bilateral mastectomies with reconstruction ([place], [place]) Right breast: atypical lobular hyperplasia and no evidence of carcinoma in 1 lymph node. Left breast: invasive lobular carcinoma with LCIS, grade 2, 2.1cm, 2 lymph nodes negative for carcinoma, and invasive carcinoma broadly present at the anterior margin.    02/07/2019 Cancer Staging   Staging form: Breast, AJCC 8th Edition - Pathologic stage from 02/07/2019: Stage IA (pT2, pN0, cM0, G2, ER+, PR+, HER2-, Oncotype DX score: 24) - Signed by Gardenia Phlegm, NP on 03/02/2019    02/07/2019 Oncotype testing   24/10%; no benefit from chemo   02/2019 -  Anti-estrogen oral therapy   Anastrozole daily switched to letrozole 09/26/2019   06/28/2021 Progression   CT parathyroid: 06/28/2021: 9 mm soft tissue nodule possible parathyroid adenoma, 6 mm left lobe of the thyroid nodule PET CT scan  07/05/2021: Diffuse hepatic hypermetabolism concerning for liver metastases versus hepatocellular carcinoma, hypermetabolic left axillary lymph node suspicious for metastatic disease, heterogeneous bone marrow activity nonspecific, portal vein hypertension   07/19/2021 Initial Biopsy   Liver needle core biopsy: metastatic carcinoma, compatible with breast primary.  ER 95%+, PR negative, Ki-67 15%, HER-2 equivocal by IHC, FISH negative.     Liver metastases ([place])  06/28/2021 Progression   CT parathyroid: 06/28/2021: 9 mm soft tissue nodule possible parathyroid adenoma, 6 mm left lobe of the thyroid nodule PET CT scan 07/05/2021: Diffuse hepatic hypermetabolism concerning for liver metastases versus hepatocellular carcinoma, hypermetabolic left axillary lymph node suspicious for metastatic disease, heterogeneous bone marrow activity nonspecific, portal vein hypertension   07/19/2021 Initial Biopsy   Liver needle core biopsy: metastatic carcinoma, compatible with breast primary.  ER 95%+, PR negative, Ki-67 15%, HER-2 equivocal by IHC, FISH negative.     07/31/2021 Initial Diagnosis   Liver metastases ([facility])   08/05/2021 Cancer Staging   Staging form: Liver, AJCC 8th Edition - Pathologic: Stage IVB (pM1) - Signed by Gardenia Phlegm, NP on 08/05/2021      CHIEF COMPLIANT: Follow-up of metastatic breast cancer  INTERVAL HISTORY: Christine Francis is a 61 y.o. with above-mentioned history of metastatic breast cancer, currently on radiation therapy. She presents to the clinic today for follow-up.  Overall her appetite has improved with dexamethasone.  She is able to eat more.  She is also having regular bowel movements once or twice a day.  Her abdomen still continues to be distended but there was no fluid for paracentesis.  She  has been able to converse with her friends and family without any problems or concerns.  She is not confused.  She still requires a wheelchair for  ambulation.  ALLERGIES:  is allergic to ampicillin, dilaudid [hydromorphone hcl], erythromycin, penicillins, sulfa drugs cross reactors, and declomycin [demeclocycline].  MEDICATIONS:  Current Outpatient Medications  Medication Sig Dispense Refill   abemaciclib (VERZENIO) 100 MG tablet Take 1 tablet (100 mg total) by mouth 2 (two) times daily. Swallow tablets whole. Do not chew, crush, or split tablets before swallowing. 60 tablet 6   albuterol (PROVENTIL HFA;VENTOLIN HFA) 108 (90 BASE) MCG/ACT inhaler Inhale 1-2 puffs into the lungs every 6 (six) hours as needed for wheezing or shortness of breath.      ALPRAZolam (XANAX) 0.5 MG tablet Take 0.5 mg by mouth daily as needed for anxiety.     atenolol (TENORMIN) 50 MG tablet Take 1 tablet (50 mg total) by mouth daily. 30 tablet 0   dexamethasone (DECADRON) 1 MG tablet Take 1 tablet (1 mg total) by mouth 2 (two) times daily with a meal. 30 tablet 3   EPINEPHrine 0.3 mg/0.3 mL IJ SOAJ injection Inject 0.3 mg into the muscle once as needed for anaphylaxis.     fluticasone furoate-vilanterol (BREO ELLIPTA) 200-25 MCG/INH AEPB Inhale 1 puff into the lungs daily as needed (shortness of breath).     furosemide (LASIX) 40 MG tablet Take 1 tablet (40 mg total) by mouth 2 (two) times daily. 60 tablet 2   hydrochlorothiazide (HYDRODIURIL) 25 MG tablet Take 1 tablet (25 mg total) by mouth daily. 30 tablet 1   lactulose (CHRONULAC) 10 GM/15ML solution Take 30 mLs (20 g total) by mouth 3 (three) times daily. 946 mL 0   montelukast (SINGULAIR) 10 MG tablet Take 10 mg by mouth daily.      omeprazole (PRILOSEC OTC) 20 MG tablet Take 20 mg by mouth daily.     Phenylephrine-DM-GG-APAP (MUCINEX SINUS-MAX) 5-10-200-325 MG TABS Take 2 tablets by mouth daily as needed (congestion).     potassium chloride SA (KLOR-CON M) 20 MEQ tablet Take 2 tablets (40 mEq total) by mouth 2 (two) times daily. 120 tablet 1   spironolactone (ALDACTONE) 25 MG tablet Take 1 tablet (25 mg  total) by mouth daily. 30 tablet 1   No current facility-administered medications for this visit.    PHYSICAL EXAMINATION: ECOG PERFORMANCE STATUS: 3 - Symptomatic, >50% confined to bed  Vitals:   08/14/21 0948  BP: (!) 110/58  Pulse: 71  Resp: 16  Temp: 97.7 F (36.5 C)  SpO2: 99%   Filed Weights   08/14/21 0948  Weight: 173 lb (78.5 kg)      LABORATORY DATA:  I have reviewed the data as listed CMP Latest Ref Rng & Units 08/14/2021 08/09/2021 08/05/2021  Glucose 70 - 99 mg/dL 152(H) 106(H) 111(H)  BUN 8 - 23 mg/dL 20 15 14   Creatinine 0.44 - 1.00 mg/dL 1.24(H) 1.25(H) 1.12(H)  Sodium 135 - 145 mmol/L 126(L) 127(L) 126(L)  Potassium 3.5 - 5.1 mmol/L 3.8 3.4(L) 3.6  Chloride 98 - 111 mmol/L 93(L) 94(L) 94(L)  CO2 22 - 32 mmol/L 25 24 22   Calcium 8.9 - 10.3 mg/dL 14.5(HH) 13.6(HH) 13.4(HH)  Total Protein 6.5 - 8.1 g/dL 8.1 7.6 7.3  Total Bilirubin 0.3 - 1.2 mg/dL 2.7(H) 2.5(H) 2.5(H)  Alkaline Phos 38 - 126 U/L 236(H) 167(H) 147(H)  AST 15 - 41 U/L 123(H) 121(H) 127(H)  ALT 0 - 44 U/L 60(H) 48(H) 44  Lab Results  Component Value Date   WBC 14.1 (H) 08/14/2021   HGB 10.3 (L) 08/14/2021   HCT 31.3 (L) 08/14/2021   MCV 89.2 08/14/2021   PLT 325 08/14/2021   NEUTROABS 12.0 (H) 08/14/2021    ASSESSMENT & PLAN:  Malignant neoplasm of upper-outer quadrant of left breast in female, estrogen receptor positive ([place]) Right lumpectomy 11/29/2014: LCIS with fibrocystic changes with usual ductal hyperplasia,PASH, 0.8 cm margins for LCIS, took Tamoxifen from 02/07/15-12/24/18   12/08/18: Left breast 2 cm mass at 7 o clock position: ILC grade 2, ER 90%, PR 30%, Her 2 neg, Ki 67: 15% T1CN0 stage 1A   Anxiety: on Xanax   Treatment summary:  1. Bilateral mastectomies (patient preference): 02/07/2019 Bilateral mastectomies with reconstruction ([facility]) Right breast: atypical lobular hyperplasia and no evidence of carcinoma in 1 lymph node. Left breast: invasive lobular  carcinoma with LCIS, grade 2, 2.1cm, 2 lymph nodes negative for carcinoma, and invasive carcinoma broadly present at the anterior margin (margin cleared 02/24/2019).  Stage IIa 2. Oncotype Dx: Score 24, distant recurrence at 9 years 10% (did not require chemo) 3. Adj Anastrozole started 03/01/2019 changed to letrozole 09/26/2019 discontinued with disease progression   CT CAP 06/12/2021: (Performed by her PCP for elevated liver enzymes): Enlarged cirrhotic appearing liver with portal venous collaterals and esophageal varices.  Thickened endometrium worrisome for endometrial cancer.  Severe gastritis.  Borderline enlarged obturator lymph nodes   06/26/2021: Bone scan small foci of mild increased activity in frontal bones differential is hyperostosis versus metastases.  No other findings of metastatic disease. 06/28/2021: CT parathyroids is being done today 06/28/2021.   CT parathyroid: 06/28/2021: 9 mm soft tissue nodule possible parathyroid adenoma, 6 mm left lobe of the thyroid nodule PET CT scan 07/05/2021: Diffuse hepatic hypermetabolism concerning for liver metastases versus hepatocellular carcinoma, hypermetabolic left axillary lymph node suspicious for metastatic disease, heterogeneous bone marrow activity nonspecific, portal vein hypertension   Liver biopsy: Consistent with metastatic breast cancer estrogen positive, progesterone negative, HER2 negative. Hospital admission after liver biopsy, discharged on July 27, 2021   Current treatment: #1 fulvestrant given every 2 weeks x 3 doses then every 4 weeks thereafter.  Started 07/31/2021 2.  Abemaciclib 50 mg p.o. twice daily started 08/05/2021, dose increased to 100 mg twice a day 08/14/2021 3.  Zometa/Xgeva for her hypercalcemia  _______________________________________________________________________________________________ Toxicities: No diarrhea No adverse effects from fulvestrant or abemaciclib.   Severe hypercalcemia: Delton See will be  given today.  Calcium is at 14.5 Orbital mass: Currently undergoing palliative radiation     CODE STATUS: DNRCC-Arrest. Patient does not want [place] to do any intubation or chest compressions if she stops breathing or if her heart stops.   Plan to increase the dosage of abemaciclib to 100 mg p.o. twice daily with the next cycle I will see her in 2 weeks to recheck her labs and for Xgeva.      No orders of the defined types were placed in this encounter.  The patient has a good understanding of the overall plan. she agrees with it. she will call with any problems that may develop before the next visit here.  Total time spent: 30 mins including face to face time and time spent for planning, charting and coordination of care  Rulon Eisenmenger, MD, MPH 08/14/2021  I, Thana Ates, am acting as scribe for Dr. Nicholas Lose.  I have reviewed the above documentation for accuracy and completeness, and I agree with the above.

## 2021-08-13 NOTE — Assessment & Plan Note (Signed)
Right lumpectomy 11/29/2014: LCIS with fibrocystic changes with usual ductal hyperplasia,PASH, 0.8 cm margins for LCIS, took Tamoxifen from 02/07/15-12/24/18 °  °12/08/18: Left breast 2 cm mass at 7 o clock position: ILC grade 2, ER 90%, PR 30%, Her 2 neg, Ki 67: 15% T1CN0 stage 1A °  °Anxiety: on Xanax °  °Treatment summary:  °1. Bilateral mastectomies (patient preference): 02/07/2019 Bilateral mastectomies with reconstruction ([place]) °Right breast: atypical lobular hyperplasia and no evidence of carcinoma in 1 lymph node. °Left breast: invasive lobular carcinoma with LCIS, grade 2, 2.1cm, 2 lymph nodes negative for carcinoma, and invasive carcinoma broadly present at the anterior margin (margin cleared 02/24/2019).  °Stage IIa °2. Oncotype Dx: Score 24, distant recurrence at 9 years 10% (did not require chemo) °3. Adj Anastrozole started 03/01/2019 changed to letrozole 09/26/2019 discontinued with disease progression °  °CT CAP 06/12/2021: (Performed by her PCP for elevated liver enzymes): Enlarged cirrhotic appearing liver with portal venous collaterals and esophageal varices.  Thickened endometrium worrisome for endometrial cancer.  Severe gastritis.  Borderline enlarged obturator lymph nodes °  °06/26/2021: Bone scan small foci of mild increased activity in frontal bones differential is hyperostosis versus metastases.  No other findings of metastatic disease. °06/28/2021: CT parathyroids is being done today 06/28/2021. °  °CT parathyroid: 06/28/2021: 9 mm soft tissue nodule possible parathyroid adenoma, 6 mm left lobe of the thyroid nodule °PET CT scan 07/05/2021: Diffuse hepatic hypermetabolism concerning for liver metastases versus hepatocellular carcinoma, hypermetabolic left axillary lymph node suspicious for metastatic disease, heterogeneous bone marrow activity nonspecific, portal vein hypertension °  °Liver biopsy: Consistent with metastatic breast cancer estrogen positive, progesterone negative,  HER2 negative. °Hospital admission after liver biopsy, discharged on July 27, 2021 °  °Current treatment: °#1 fulvestrant given every 2 weeks x 3 doses then every 4 weeks thereafter.  Started 07/31/2021 °2.  Abemaciclib 50 mg p.o. twice daily started 08/05/2021 °3.  Zometa/Xgeva for her hypercalcemia  °_______________________________________________________________________________________________ °Toxicities: °1. 3 loose stools per day because of lactulose °2. No adverse effects from fulvestrant or abemaciclib. °  °Severe hypercalcemia: Xgeva will be given today.  Calcium is at 13.6. °Orbital mass: Currently undergoing palliative radiation °Recurrent ascites: We will request to radiology for paracentesis again. °  °CODE STATUS: DNRCC-Arrest. °Patient does not want us to do any intubation or chest compressions if she stops breathing or if her heart stops. °  °Plan to increase the dosage of abemaciclib to 100 mg p.o. twice daily with the next cycle °I will see her next week to recheck her labs and for Xgeva. °  °

## 2021-08-14 ENCOUNTER — Ambulatory Visit
Admission: RE | Admit: 2021-08-14 | Discharge: 2021-08-14 | Disposition: A | Discharge status: Home / Self Care | Payer: 59 | Source: Ambulatory Visit | Attending: Radiation Oncology | Admitting: Radiation Oncology

## 2021-08-14 ENCOUNTER — Other Ambulatory Visit (HOSPITAL_COMMUNITY): Payer: Self-pay

## 2021-08-14 ENCOUNTER — Inpatient Hospital Stay (HOSPITAL_BASED_OUTPATIENT_CLINIC_OR_DEPARTMENT_OTHER): Payer: 59 | Admitting: Nurse Practitioner

## 2021-08-14 ENCOUNTER — Other Ambulatory Visit: Payer: Self-pay

## 2021-08-14 ENCOUNTER — Ambulatory Visit: Payer: 59

## 2021-08-14 ENCOUNTER — Inpatient Hospital Stay: Payer: 59 | Attending: Hematology and Oncology

## 2021-08-14 ENCOUNTER — Inpatient Hospital Stay: Payer: 59

## 2021-08-14 ENCOUNTER — Encounter: Payer: Self-pay | Admitting: Nurse Practitioner

## 2021-08-14 ENCOUNTER — Encounter: Payer: Self-pay | Admitting: *Deleted

## 2021-08-14 ENCOUNTER — Inpatient Hospital Stay: Payer: 59 | Admitting: Hematology and Oncology

## 2021-08-14 DIAGNOSIS — K5900 Constipation, unspecified: Secondary | ICD-10-CM

## 2021-08-14 DIAGNOSIS — C50412 Malignant neoplasm of upper-outer quadrant of left female breast: Secondary | ICD-10-CM

## 2021-08-14 DIAGNOSIS — R530 Neoplastic (malignant) related fatigue: Secondary | ICD-10-CM

## 2021-08-14 DIAGNOSIS — R531 Weakness: Secondary | ICD-10-CM

## 2021-08-14 DIAGNOSIS — Z170 Estrogen receptor positive status [ER+]: Secondary | ICD-10-CM | POA: Insufficient documentation

## 2021-08-14 DIAGNOSIS — R634 Abnormal weight loss: Secondary | ICD-10-CM

## 2021-08-14 DIAGNOSIS — Z5111 Encounter for antineoplastic chemotherapy: Secondary | ICD-10-CM | POA: Insufficient documentation

## 2021-08-14 DIAGNOSIS — C7949 Secondary malignant neoplasm of other parts of nervous system: Secondary | ICD-10-CM | POA: Insufficient documentation

## 2021-08-14 DIAGNOSIS — C787 Secondary malignant neoplasm of liver and intrahepatic bile duct: Secondary | ICD-10-CM | POA: Insufficient documentation

## 2021-08-14 DIAGNOSIS — R180 Malignant ascites: Secondary | ICD-10-CM | POA: Insufficient documentation

## 2021-08-14 DIAGNOSIS — Z515 Encounter for palliative care: Secondary | ICD-10-CM | POA: Diagnosis not present

## 2021-08-14 LAB — CBC WITH DIFFERENTIAL (CANCER CENTER ONLY)
Abs Immature Granulocytes: 0.12 10*3/uL — ABNORMAL HIGH (ref 0.00–0.07)
Basophils Absolute: 0 10*3/uL (ref 0.0–0.1)
Basophils Relative: 0 %
Eosinophils Absolute: 0 10*3/uL (ref 0.0–0.5)
Eosinophils Relative: 0 %
HCT: 31.3 % — ABNORMAL LOW (ref 36.0–46.0)
Hemoglobin: 10.3 g/dL — ABNORMAL LOW (ref 12.0–15.0)
Immature Granulocytes: 1 %
Lymphocytes Relative: 9 %
Lymphs Abs: 1.2 10*3/uL (ref 0.7–4.0)
MCH: 29.3 pg (ref 26.0–34.0)
MCHC: 32.9 g/dL (ref 30.0–36.0)
MCV: 89.2 fL (ref 80.0–100.0)
Monocytes Absolute: 0.8 10*3/uL (ref 0.1–1.0)
Monocytes Relative: 5 %
Neutro Abs: 12 10*3/uL — ABNORMAL HIGH (ref 1.7–7.7)
Neutrophils Relative %: 85 %
Platelet Count: 325 10*3/uL (ref 150–400)
RBC: 3.51 MIL/uL — ABNORMAL LOW (ref 3.87–5.11)
RDW: 18.6 % — ABNORMAL HIGH (ref 11.5–15.5)
WBC Count: 14.1 10*3/uL — ABNORMAL HIGH (ref 4.0–10.5)
nRBC: 0 % (ref 0.0–0.2)

## 2021-08-14 LAB — CMP (CANCER CENTER ONLY)
ALT: 60 U/L — ABNORMAL HIGH (ref 0–44)
AST: 123 U/L — ABNORMAL HIGH (ref 15–41)
Albumin: 3.2 g/dL — ABNORMAL LOW (ref 3.5–5.0)
Alkaline Phosphatase: 236 U/L — ABNORMAL HIGH (ref 38–126)
Anion gap: 8 (ref 5–15)
BUN: 20 mg/dL (ref 8–23)
CO2: 25 mmol/L (ref 22–32)
Calcium: 14.5 mg/dL (ref 8.9–10.3)
Chloride: 93 mmol/L — ABNORMAL LOW (ref 98–111)
Creatinine: 1.24 mg/dL — ABNORMAL HIGH (ref 0.44–1.00)
GFR, Estimated: 49 mL/min — ABNORMAL LOW (ref >60)
Glucose, Bld: 152 mg/dL — ABNORMAL HIGH (ref 70–99)
Potassium: 3.8 mmol/L (ref 3.5–5.1)
Sodium: 126 mmol/L — ABNORMAL LOW (ref 135–145)
Total Bilirubin: 2.7 mg/dL — ABNORMAL HIGH (ref 0.3–1.2)
Total Protein: 8.1 g/dL (ref 6.5–8.1)

## 2021-08-14 MED ORDER — DENOSUMAB 120 MG/1.7ML ~~LOC~~ SOLN
120.0000 mg | Freq: Once | SUBCUTANEOUS | Status: AC
  Administered 2021-08-14: 120 mg via SUBCUTANEOUS
  Filled 2021-08-14: qty 1.7

## 2021-08-14 MED ORDER — ABEMACICLIB 100 MG PO TABS
100.0000 mg | ORAL_TABLET | Freq: Two times a day (BID) | ORAL | 6 refills | Status: DC
  Filled 2021-08-14: qty 70, 35d supply, fill #0

## 2021-08-14 MED ORDER — FULVESTRANT 250 MG/5ML IM SOSY
500.0000 mg | PREFILLED_SYRINGE | Freq: Once | INTRAMUSCULAR | Status: AC
  Administered 2021-08-14: 500 mg via INTRAMUSCULAR
  Filled 2021-08-14: qty 10

## 2021-08-14 NOTE — Progress Notes (Signed)
CRITICAL VALUE STICKER  CRITICAL VALUE: CA 14.5  RECEIVER (on-site recipient of call): Merleen Nicely, [place] NOTIFIED: 08/14/21 at [place] (representative from lab): Ulice Dash  MD NOTIFIED: Nicholas Lose, MD  TIME OF NOTIFICATION: 08/14/21 at 1030  RESPONSE: MD notified and verbalized understanding.  No orders received at this time.

## 2021-08-14 NOTE — Progress Notes (Signed)
[facility]  [phone]) [phone] [phone]) [phone]   Name: Christine Francis Date: 08/14/2021 MRN: [id number]  DOB: Mar 06, 1961  Patient Care Team: Molli Posey, MD as PCP - General (Obstetrics and Gynecology) [name], MD as Consulting Physician (Plastic Surgery) Donnie Mesa, MD as Consulting Physician (General Surgery) Nicholas Lose, MD as Consulting Physician (Hematology and Oncology)    REASON FOR CONSULTATION: Christine Francis is a 61 y.o. female with metastatic breast cancer.  Palliative ask to see for symptom management and goals of care.    SOCIAL HISTORY:     reports that she has never smoked. She has never used smokeless tobacco. She reports current alcohol use of about 1.0 standard drink per week. She reports that she does not use drugs.  ADVANCE DIRECTIVES:  Patient has advanced directive on file.  Her Sister Hinton Dyer is her Education officer, community.  Confirms wishes for no life-sustaining measures including artificial feedings.  MOLST form was introduced today.  CODE STATUS: DNR  PAST MEDICAL HISTORY: Past Medical History:  Diagnosis Date   Allergy    allergy shots weekly   Anxiety    no meds currently   Asthma    Breast cancer ([place])    left - LCIS - bilat mastec   Cancer ([place])    skin- basil cancer face, chest and left leg--   GERD (gastroesophageal reflux disease)    Hyperlipidemia    diet controlled, no meds   Hypertension    IBS (irritable bowel syndrome)    no problems currently   Personal history of colonic adenomas 11/17/2005   11/17/2005 - 3 adenomas - one  A TV adenoma was 12 mm (max)   HEMATOLOGY/ONCOLOGY HISTORY:  Oncology History  Malignant neoplasm of upper-outer quadrant of left breast in female, estrogen receptor positive ([place])  07/19/2014 Initial Biopsy   Rt.Breast Biopsy: LCIS   11/29/2014 Surgery   Right lumpectomy: LCIS with fibrocystic changes with usual ductal hyperplasia,PASH,  0.8 cm margins for LCIS   02/06/2015 -  Anti-estrogen oral therapy   tamoxifen 20 mg daily   12/08/2018 Relapse/Recurrence   Left breast 2 cm mass at 7 o clock position: ILC grade 2, ER 90%, PR 30%, Her 2 neg, Ki 67: 15% T1CN0 stage 1A   02/07/2019 Surgery   Bilateral mastectomies with reconstruction ([place]) Right breast: atypical lobular hyperplasia and no evidence of carcinoma in 1 lymph node. Left breast: invasive lobular carcinoma with LCIS, grade 2, 2.1cm, 2 lymph nodes negative for carcinoma, and invasive carcinoma broadly present at the anterior margin.    02/07/2019 Cancer Staging   Staging form: Breast, AJCC 8th Edition - Pathologic stage from 02/07/2019: Stage IA (pT2, pN0, cM0, G2, ER+, PR+, HER2-, Oncotype DX score: 24) - Signed by Gardenia Phlegm, NP on 03/02/2019    02/07/2019 Oncotype testing   24/10%; no benefit from chemo   02/2019 -  Anti-estrogen oral therapy   Anastrozole daily switched to letrozole 09/26/2019   06/28/2021 Progression   CT parathyroid: 06/28/2021: 9 mm soft tissue nodule possible parathyroid adenoma, 6 mm left lobe of the thyroid nodule PET CT scan 07/05/2021: Diffuse hepatic hypermetabolism concerning for liver metastases versus hepatocellular carcinoma, hypermetabolic left axillary lymph node suspicious for metastatic disease, heterogeneous bone marrow activity nonspecific, portal vein hypertension   07/19/2021 Initial Biopsy   Liver needle core biopsy: metastatic carcinoma, compatible with breast primary.  ER 95%+, PR negative, Ki-67 15%, HER-2 equivocal by IHC, FISH negative.  Liver metastases ([place])  06/28/2021 Progression   CT parathyroid: 06/28/2021: 9 mm soft tissue nodule possible parathyroid adenoma, 6 mm left lobe of the thyroid nodule PET CT scan 07/05/2021: Diffuse hepatic hypermetabolism concerning for liver metastases versus hepatocellular carcinoma, hypermetabolic left axillary lymph node suspicious for metastatic  disease, heterogeneous bone marrow activity nonspecific, portal vein hypertension   07/19/2021 Initial Biopsy   Liver needle core biopsy: metastatic carcinoma, compatible with breast primary.  ER 95%+, PR negative, Ki-67 15%, HER-2 equivocal by IHC, FISH negative.     07/31/2021 Initial Diagnosis   Liver metastases ([place])   08/05/2021 Cancer Staging   Staging form: Liver, AJCC 8th Edition - Pathologic: Stage IVB (pM1) - Signed by Gardenia Phlegm, NP on 08/05/2021      ALLERGIES:  is allergic to ampicillin, dilaudid [hydromorphone hcl], erythromycin, penicillins, sulfa drugs cross reactors, and declomycin [demeclocycline].  MEDICATIONS:  Current Outpatient Medications  Medication Sig Dispense Refill   abemaciclib (VERZENIO) 100 MG tablet Take 1 tablet (100 mg total) by mouth 2 (two) times daily. Swallow tablets whole. Do not chew, crush, or split tablets before swallowing. 60 tablet 6   albuterol (PROVENTIL HFA;VENTOLIN HFA) 108 (90 BASE) MCG/ACT inhaler Inhale 1-2 puffs into the lungs every 6 (six) hours as needed for wheezing or shortness of breath.      ALPRAZolam (XANAX) 0.5 MG tablet Take 0.5 mg by mouth daily as needed for anxiety.     atenolol (TENORMIN) 50 MG tablet Take 1 tablet (50 mg total) by mouth daily. 30 tablet 0   dexamethasone (DECADRON) 1 MG tablet Take 1 tablet (1 mg total) by mouth 2 (two) times daily with a meal. 30 tablet 3   EPINEPHrine 0.3 mg/0.3 mL IJ SOAJ injection Inject 0.3 mg into the muscle once as needed for anaphylaxis.     fluticasone furoate-vilanterol (BREO ELLIPTA) 200-25 MCG/INH AEPB Inhale 1 puff into the lungs daily as needed (shortness of breath).     furosemide (LASIX) 40 MG tablet Take 1 tablet (40 mg total) by mouth 2 (two) times daily. 60 tablet 2   hydrochlorothiazide (HYDRODIURIL) 25 MG tablet Take 1 tablet (25 mg total) by mouth daily. 30 tablet 1   lactulose (CHRONULAC) 10 GM/15ML solution Take 30 mLs (20 g total) by mouth 3 (three)  times daily. 946 mL 0   montelukast (SINGULAIR) 10 MG tablet Take 10 mg by mouth daily.      omeprazole (PRILOSEC OTC) 20 MG tablet Take 20 mg by mouth daily.     Phenylephrine-DM-GG-APAP (MUCINEX SINUS-MAX) 5-10-200-325 MG TABS Take 2 tablets by mouth daily as needed (congestion).     potassium chloride SA (KLOR-CON M) 20 MEQ tablet Take 2 tablets (40 mEq total) by mouth 2 (two) times daily. 120 tablet 1   spironolactone (ALDACTONE) 25 MG tablet Take 1 tablet (25 mg total) by mouth daily. 30 tablet 1   No current facility-administered medications for this visit.    VITAL SIGNS: There were no vitals taken for this visit. There were no vitals filed for this visit.  Estimated body mass index is 27.92 kg/m as calculated from the following:   Height as of 08/09/21: _0  (1.676 m).   Weight as of an earlier encounter on 08/14/21: 173 lb (78.5 kg).  LABS: CBC:    Component Value Date/Time   WBC 14.1 (H) 08/14/2021 0917   WBC 17.9 (H) 07/27/2021 0503   HGB 10.3 (L) 08/14/2021 0917   HGB 13.8 03/27/2015 7673  HCT 31.3 (L) 08/14/2021 0917   HCT 41.0 03/27/2015 0937   PLT 325 08/14/2021 0917   PLT 214 03/27/2015 0937   MCV 89.2 08/14/2021 0917   MCV 85.6 03/27/2015 0937   NEUTROABS 12.0 (H) 08/14/2021 0917   NEUTROABS 4.5 03/27/2015 0937   LYMPHSABS 1.2 08/14/2021 0917   LYMPHSABS 2.0 03/27/2015 0937   MONOABS 0.8 08/14/2021 0917   MONOABS 0.4 03/27/2015 0937   EOSABS 0.0 08/14/2021 0917   EOSABS 0.2 03/27/2015 0937   BASOSABS 0.0 08/14/2021 0917   BASOSABS 0.1 03/27/2015 0937   Comprehensive Metabolic Panel:    Component Value Date/Time   NA 126 (L) 08/14/2021 0917   NA 141 03/27/2015 0937   K 3.8 08/14/2021 0917   K 3.4 (L) 03/27/2015 0937   CL 93 (L) 08/14/2021 0917   CO2 25 08/14/2021 0917   CO2 29 03/27/2015 0937   BUN 20 08/14/2021 0917   BUN 16.2 03/27/2015 0937   CREATININE 1.24 (H) 08/14/2021 0917   CREATININE 0.8 03/27/2015 0937   GLUCOSE 152 (H) 08/14/2021  0917   GLUCOSE 137 03/27/2015 0937   CALCIUM 14.5 (HH) 08/14/2021 0917   CALCIUM 12.0 (H) 06/28/2021 1346   CALCIUM 9.6 03/27/2015 0937   AST 123 (H) 08/14/2021 0917   AST 19 03/27/2015 0937   ALT 60 (H) 08/14/2021 0917   ALT 25 03/27/2015 0937   ALKPHOS 236 (H) 08/14/2021 0917   ALKPHOS 50 03/27/2015 0937   BILITOT 2.7 (H) 08/14/2021 0917   BILITOT 0.73 03/27/2015 0937   PROT 8.1 08/14/2021 0917   PROT 7.2 03/27/2015 0937   ALBUMIN 3.2 (L) 08/14/2021 0917   ALBUMIN 3.9 03/27/2015 0937   PERFORMANCE STATUS (ECOG) : 3 - Symptomatic, >50% confined to bed  Review of Systems  Constitutional:  Positive for appetite change.  Gastrointestinal:  Positive for abdominal distention.  Neurological:  Positive for weakness.  Unless otherwise noted, a complete review of systems is negative.  Physical Exam General: NAD, ill-appearing, in wheelchair Cardiovascular: regular rate and rhythm Pulmonary: clear ant fields Abdomen: Firm, distended, nontender, + bowel sounds Extremities: no edema, no joint deformities Skin: no rashes Neurological: Weakness but otherwise nonfocal  IMPRESSION: This is my initial visit with Ms. Clements.  Her is here Hinton Dyer is present with her for support, she is also a Marine scientist.  Patient is sitting in a wheelchair.  Weak appearing.  Alert and oriented x4.  Able to engage in conversations.  I introduced myself, Advertising copywriter, and Palliative's role in collaboration with the oncology team. Concept of Palliative Care was introduced as specialized medical care for people and their families living with serious illness.  It focuses on providing relief from the symptoms and stress of a serious illness.  The goal is to improve quality of life for both the patient and the family. Values and goals of care important to patient and family were attempted to be elicited.   Ms. Kinnick lives in the home alone however her sister is here offered ongoing support due to patient's recent health decline.   She has never married or had any children.  Both of her parents are currently living in [place].  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   She shares her health has declined rapidly over the past 6 months leaving her relying on assistance with ADLs, decreased appetite, and confined to the bed or chair.   Neoplasm related pain Yumalay shares she is appreciative that she is  currently not experiencing severe pain as she was in the past. Suprisingly this has decreased significantly. She is not requiring any oral medications at this time.   Decreased appetite/anorexia Her appetite remains minimal but with some improvement since starting on the dexamethasone last week.  She is able to eat more and has somewhat of an increased desire. Her sister reports patient is drinking Boost/Ensure 1-2x daily for added protein. Patients weight on 1/18 was 187lb and today's weight is 173 lb. She understands importance of nutrition but also disease trajectory.   Constipation Ms. Napoles is having bowel movements.  Does endorse ongoing constipation.  We discussed use of prescribed lactulose and MiraLAX. Discussed water intake and importance of bowel regimen to prevent abdominal discomfort.   Fatigue  Amya continues to complain of ongoing fatigue. She shares she is not able to do much activity without feeling overwhelmingly tired. This has improved since taking the dexamethasone.   Anxiety  She shares she is claustrophobic requiring xanax prior to her radiation treatments. She also gets anxious as she thinks about the "what ifs" or what could be different. Emotional support provided. Encouraged Ms. Hitt to focus on taking things one day at a time and making memories with her family every chance that she has. No one can predict the course of cancer although medical providers remain hopeful for responses with treatment however with awareness not all situations turn-out the way that we  hope! She expressed understanding and appreciation.   6. Goals of Care We discussed Her current illness and what it means in the larger context of Her on-going co-morbidities. Natural disease trajectory and expectations were discussed. Maysoon and her sister verbalized understanding of her illness and prognosis. Ms. Brazee is tearful expressing her relationship with God and knowing that her life is in his hands. She is not ready to pass away expressing wanting more time to see her niece grow-up, but also acknowledges reality. Emotional support provided.   I approached discussions regarding advanced directives and end-of-life wishes. She has documents identifying her sister and mother as her medical decision makers. Although her sister would be her main and primary HCPOA given her mother's advanced age. [place] confirms wishes for DNR/DNI and no artificial feedings or life-prolonging measures. Quality of life is most important to her compared to quantity.   I introduced the MOST form to Ms. Nachreiner and her sister. Education provided. Although she is clear on her wishes and selections she would like to further review document and discuss amongst her family before completing. She understands we can complete document at anytime she wishes.   I discussed the importance of continued conversation with family and their medical providers regarding overall plan of care and treatment options, ensuring decisions are within the context of the patients values and GOCs.  PLAN: Continue dexamethasone as prescribed for fatigue and appetite stimulant. Xanax as needed for anxiety.  Lactulose and Miralax for bowel regimen She denies pain at this time. Ongoing goals of care discussions. MOST form discussed and education provided. Plans to complete at follow-up visit.  I will plan to see patient back in 2-3 weeks in collaboration to other oncology appointments.    Patient expressed understanding and was in agreement with  this plan. She also understands that She can call the clinic at any time with any questions, concerns, or complaints.   Time Total: 55 min.   Visit consisted of counseling and education dealing with the complex and emotionally intense issues of symptom management  and palliative care in the setting of serious and potentially life-threatening illness.Greater than 50%  of this time was spent counseling and coordinating care related to the above assessment and plan.  Signed by: Alda Lea, AGPCNP-BC Palliative Medicine Team

## 2021-08-15 ENCOUNTER — Ambulatory Visit
Admission: RE | Admit: 2021-08-15 | Discharge: 2021-08-15 | Disposition: A | Discharge status: Home / Self Care | Payer: 59 | Source: Ambulatory Visit | Attending: Radiation Oncology | Admitting: Radiation Oncology

## 2021-08-15 ENCOUNTER — Telehealth: Payer: Self-pay | Admitting: Nurse Practitioner

## 2021-08-15 DIAGNOSIS — Z5111 Encounter for antineoplastic chemotherapy: Secondary | ICD-10-CM | POA: Diagnosis not present

## 2021-08-15 NOTE — Telephone Encounter (Signed)
Schedule per 2/1 los, pt sister has been called and confirmed

## 2021-08-16 ENCOUNTER — Other Ambulatory Visit: Payer: Self-pay

## 2021-08-16 ENCOUNTER — Other Ambulatory Visit (HOSPITAL_COMMUNITY): Payer: Self-pay

## 2021-08-16 ENCOUNTER — Telehealth: Payer: Self-pay

## 2021-08-16 ENCOUNTER — Ambulatory Visit
Admission: RE | Admit: 2021-08-16 | Discharge: 2021-08-16 | Disposition: A | Discharge status: Home / Self Care | Payer: 59 | Source: Ambulatory Visit | Attending: Radiation Oncology | Admitting: Radiation Oncology

## 2021-08-16 DIAGNOSIS — Z170 Estrogen receptor positive status [ER+]: Secondary | ICD-10-CM

## 2021-08-16 DIAGNOSIS — E7220 Disorder of urea cycle metabolism, unspecified: Secondary | ICD-10-CM | POA: Diagnosis not present

## 2021-08-16 DIAGNOSIS — C50412 Malignant neoplasm of upper-outer quadrant of left female breast: Secondary | ICD-10-CM

## 2021-08-16 MED ORDER — ABEMACICLIB 100 MG PO TABS: 100.0000 mg | ORAL_TABLET | Freq: Two times a day (BID) | ORAL | 6 refills | Status: DC

## 2021-08-16 NOTE — Telephone Encounter (Signed)
Oral Oncology Pharmacist Encounter  Prescription refill for verzenio sent to [facility] in error. Prescription redirected to [facility].  Drema Halon, PharmD Hematology/Oncology Clinical Pharmacist [facility] [phone] 08/16/2021 12:25 PM

## 2021-08-19 ENCOUNTER — Other Ambulatory Visit: Payer: Self-pay | Admitting: *Deleted

## 2021-08-19 ENCOUNTER — Inpatient Hospital Stay (HOSPITAL_COMMUNITY)
Admission: EM | Admit: 2021-08-19 | Discharge: 2021-08-24 | DRG: 640 | Disposition: A | Discharge status: Hospice | Payer: 59 | Attending: Internal Medicine | Admitting: Internal Medicine

## 2021-08-19 ENCOUNTER — Ambulatory Visit
Admission: RE | Admit: 2021-08-19 | Discharge: 2021-08-19 | Disposition: A | Discharge status: Home / Self Care | Payer: 59 | Source: Ambulatory Visit | Attending: Radiation Oncology | Admitting: Radiation Oncology

## 2021-08-19 ENCOUNTER — Emergency Department (HOSPITAL_COMMUNITY): Payer: 59

## 2021-08-19 ENCOUNTER — Other Ambulatory Visit: Payer: Self-pay

## 2021-08-19 ENCOUNTER — Telehealth: Payer: Self-pay | Admitting: *Deleted

## 2021-08-19 ENCOUNTER — Ambulatory Visit: Payer: 59

## 2021-08-19 ENCOUNTER — Other Ambulatory Visit: Payer: Self-pay | Admitting: Adult Health

## 2021-08-19 ENCOUNTER — Encounter (HOSPITAL_COMMUNITY): Payer: Self-pay

## 2021-08-19 DIAGNOSIS — D6481 Anemia due to antineoplastic chemotherapy: Secondary | ICD-10-CM | POA: Diagnosis present

## 2021-08-19 DIAGNOSIS — Z20822 Contact with and (suspected) exposure to covid-19: Secondary | ICD-10-CM | POA: Diagnosis present

## 2021-08-19 DIAGNOSIS — Z881 Allergy status to other antibiotic agents status: Secondary | ICD-10-CM | POA: Diagnosis not present

## 2021-08-19 DIAGNOSIS — Z170 Estrogen receptor positive status [ER+]: Secondary | ICD-10-CM

## 2021-08-19 DIAGNOSIS — T451X5A Adverse effect of antineoplastic and immunosuppressive drugs, initial encounter: Secondary | ICD-10-CM | POA: Diagnosis present

## 2021-08-19 DIAGNOSIS — C787 Secondary malignant neoplasm of liver and intrahepatic bile duct: Secondary | ICD-10-CM | POA: Diagnosis present

## 2021-08-19 DIAGNOSIS — E872 Acidosis, unspecified: Secondary | ICD-10-CM | POA: Diagnosis present

## 2021-08-19 DIAGNOSIS — E861 Hypovolemia: Secondary | ICD-10-CM | POA: Diagnosis present

## 2021-08-19 DIAGNOSIS — C50412 Malignant neoplasm of upper-outer quadrant of left female breast: Secondary | ICD-10-CM

## 2021-08-19 DIAGNOSIS — C7800 Secondary malignant neoplasm of unspecified lung: Secondary | ICD-10-CM | POA: Diagnosis present

## 2021-08-19 DIAGNOSIS — R410 Disorientation, unspecified: Secondary | ICD-10-CM | POA: Diagnosis not present

## 2021-08-19 DIAGNOSIS — Z79899 Other long term (current) drug therapy: Secondary | ICD-10-CM

## 2021-08-19 DIAGNOSIS — E871 Hypo-osmolality and hyponatremia: Secondary | ICD-10-CM | POA: Diagnosis present

## 2021-08-19 DIAGNOSIS — E7220 Disorder of urea cycle metabolism, unspecified: Secondary | ICD-10-CM | POA: Diagnosis not present

## 2021-08-19 DIAGNOSIS — J45909 Unspecified asthma, uncomplicated: Secondary | ICD-10-CM | POA: Diagnosis present

## 2021-08-19 DIAGNOSIS — Z8249 Family history of ischemic heart disease and other diseases of the circulatory system: Secondary | ICD-10-CM

## 2021-08-19 DIAGNOSIS — Z6826 Body mass index (BMI) 26.0-26.9, adult: Secondary | ICD-10-CM

## 2021-08-19 DIAGNOSIS — Z515 Encounter for palliative care: Secondary | ICD-10-CM

## 2021-08-19 DIAGNOSIS — Z66 Do not resuscitate: Secondary | ICD-10-CM | POA: Diagnosis present

## 2021-08-19 DIAGNOSIS — Z79811 Long term (current) use of aromatase inhibitors: Secondary | ICD-10-CM

## 2021-08-19 DIAGNOSIS — Z885 Allergy status to narcotic agent status: Secondary | ICD-10-CM

## 2021-08-19 DIAGNOSIS — F419 Anxiety disorder, unspecified: Secondary | ICD-10-CM | POA: Diagnosis present

## 2021-08-19 DIAGNOSIS — D72829 Elevated white blood cell count, unspecified: Secondary | ICD-10-CM

## 2021-08-19 DIAGNOSIS — Z91013 Allergy to seafood: Secondary | ICD-10-CM

## 2021-08-19 DIAGNOSIS — Z880 Allergy status to penicillin: Secondary | ICD-10-CM

## 2021-08-19 DIAGNOSIS — N179 Acute kidney failure, unspecified: Secondary | ICD-10-CM | POA: Diagnosis present

## 2021-08-19 DIAGNOSIS — G9341 Metabolic encephalopathy: Secondary | ICD-10-CM | POA: Diagnosis present

## 2021-08-19 DIAGNOSIS — R748 Abnormal levels of other serum enzymes: Secondary | ICD-10-CM | POA: Diagnosis not present

## 2021-08-19 DIAGNOSIS — T380X5A Adverse effect of glucocorticoids and synthetic analogues, initial encounter: Secondary | ICD-10-CM | POA: Diagnosis present

## 2021-08-19 DIAGNOSIS — E785 Hyperlipidemia, unspecified: Secondary | ICD-10-CM | POA: Diagnosis present

## 2021-08-19 DIAGNOSIS — K649 Unspecified hemorrhoids: Secondary | ICD-10-CM | POA: Diagnosis present

## 2021-08-19 DIAGNOSIS — K921 Melena: Secondary | ICD-10-CM

## 2021-08-19 DIAGNOSIS — E440 Moderate protein-calorie malnutrition: Secondary | ICD-10-CM | POA: Diagnosis present

## 2021-08-19 DIAGNOSIS — Z808 Family history of malignant neoplasm of other organs or systems: Secondary | ICD-10-CM

## 2021-08-19 DIAGNOSIS — I10 Essential (primary) hypertension: Secondary | ICD-10-CM | POA: Diagnosis not present

## 2021-08-19 DIAGNOSIS — K219 Gastro-esophageal reflux disease without esophagitis: Secondary | ICD-10-CM | POA: Diagnosis present

## 2021-08-19 DIAGNOSIS — Z882 Allergy status to sulfonamides status: Secondary | ICD-10-CM

## 2021-08-19 DIAGNOSIS — Z7951 Long term (current) use of inhaled steroids: Secondary | ICD-10-CM

## 2021-08-19 HISTORY — DX: Hypercalcemia: E83.52

## 2021-08-19 LAB — BASIC METABOLIC PANEL
Anion gap: 10 (ref 5–15)
BUN: 33 mg/dL — ABNORMAL HIGH (ref 8–23)
CO2: 25 mmol/L (ref 22–32)
Calcium: >15 mg/dL (ref 8.9–10.3)
Chloride: 92 mmol/L — ABNORMAL LOW (ref 98–111)
Creatinine, Ser: 1.24 mg/dL — ABNORMAL HIGH (ref 0.44–1.00)
GFR, Estimated: 49 mL/min — ABNORMAL LOW (ref >60)
Glucose, Bld: 103 mg/dL — ABNORMAL HIGH (ref 70–99)
Potassium: 3.9 mmol/L (ref 3.5–5.1)
Sodium: 127 mmol/L — ABNORMAL LOW (ref 135–145)

## 2021-08-19 LAB — HEPATIC FUNCTION PANEL
ALT: 87 U/L — ABNORMAL HIGH (ref 0–44)
AST: 129 U/L — ABNORMAL HIGH (ref 15–41)
Albumin: 3 g/dL — ABNORMAL LOW (ref 3.5–5.0)
Alkaline Phosphatase: 214 U/L — ABNORMAL HIGH (ref 38–126)
Bilirubin, Direct: 1.1 mg/dL — ABNORMAL HIGH (ref 0.0–0.2)
Indirect Bilirubin: 1.9 mg/dL — ABNORMAL HIGH (ref 0.3–0.9)
Total Bilirubin: 3 mg/dL — ABNORMAL HIGH (ref 0.3–1.2)
Total Protein: 8 g/dL (ref 6.5–8.1)

## 2021-08-19 LAB — URINALYSIS, ROUTINE W REFLEX MICROSCOPIC
Bacteria, UA: NONE SEEN
Bilirubin Urine: NEGATIVE
Glucose, UA: NEGATIVE mg/dL
Hgb urine dipstick: NEGATIVE
Ketones, ur: NEGATIVE mg/dL
Nitrite: NEGATIVE
Protein, ur: NEGATIVE mg/dL
Specific Gravity, Urine: 1.01 (ref 1.005–1.030)
pH: 6 (ref 5.0–8.0)

## 2021-08-19 LAB — I-STAT CHEM 8, ED
BUN: 31 mg/dL — ABNORMAL HIGH (ref 8–23)
Calcium, Ion: 1.99 mmol/L (ref 1.15–1.40)
Chloride: 95 mmol/L — ABNORMAL LOW (ref 98–111)
Creatinine, Ser: 1.4 mg/dL — ABNORMAL HIGH (ref 0.44–1.00)
Glucose, Bld: 100 mg/dL — ABNORMAL HIGH (ref 70–99)
HCT: 34 % — ABNORMAL LOW (ref 36.0–46.0)
Hemoglobin: 11.6 g/dL — ABNORMAL LOW (ref 12.0–15.0)
Potassium: 3.9 mmol/L (ref 3.5–5.1)
Sodium: 129 mmol/L — ABNORMAL LOW (ref 135–145)
TCO2: 27 mmol/L (ref 22–32)

## 2021-08-19 LAB — CBC
HCT: 33.2 % — ABNORMAL LOW (ref 36.0–46.0)
Hemoglobin: 10.9 g/dL — ABNORMAL LOW (ref 12.0–15.0)
MCH: 29.5 pg (ref 26.0–34.0)
MCHC: 32.8 g/dL (ref 30.0–36.0)
MCV: 90 fL (ref 80.0–100.0)
Platelets: 266 10*3/uL (ref 150–400)
RBC: 3.69 MIL/uL — ABNORMAL LOW (ref 3.87–5.11)
RDW: 20.7 % — ABNORMAL HIGH (ref 11.5–15.5)
WBC: 13.5 10*3/uL — ABNORMAL HIGH (ref 4.0–10.5)
nRBC: 0.7 % — ABNORMAL HIGH (ref 0.0–0.2)

## 2021-08-19 LAB — OSMOLALITY, URINE: Osmolality, Ur: 354 mOsm/kg (ref 300–900)

## 2021-08-19 LAB — RESP PANEL BY RT-PCR (FLU A&B, COVID) ARPGX2
Influenza A by PCR: NEGATIVE
Influenza B by PCR: NEGATIVE
SARS Coronavirus 2 by RT PCR: NEGATIVE

## 2021-08-19 LAB — CBG MONITORING, ED: Glucose-Capillary: 90 mg/dL (ref 70–99)

## 2021-08-19 LAB — OSMOLALITY: Osmolality: 290 mOsm/kg (ref 275–295)

## 2021-08-19 LAB — AMMONIA: Ammonia: 75 umol/L — ABNORMAL HIGH (ref 9–35)

## 2021-08-19 LAB — ETHANOL: Alcohol, Ethyl (B): <10 mg/dL (ref <10)

## 2021-08-19 LAB — URIC ACID: Uric Acid, Serum: 9.2 mg/dL — ABNORMAL HIGH (ref 2.5–7.1)

## 2021-08-19 MED ORDER — LACTULOSE 10 GM/15ML PO SOLN
20.0000 g | Freq: Three times a day (TID) | ORAL | Status: DC
  Administered 2021-08-19 – 2021-08-23 (×10): 20 g via ORAL
  Filled 2021-08-19 (×11): qty 30

## 2021-08-19 MED ORDER — FLUTICASONE FUROATE-VILANTEROL 200-25 MCG/ACT IN AEPB
1.0000 | INHALATION_SPRAY | Freq: Every day | RESPIRATORY_TRACT | Status: DC | PRN
  Filled 2021-08-19: qty 28

## 2021-08-19 MED ORDER — ONDANSETRON HCL 4 MG/2ML IJ SOLN
4.0000 mg | Freq: Four times a day (QID) | INTRAMUSCULAR | Status: DC | PRN
  Administered 2021-08-20 – 2021-08-21 (×2): 4 mg via INTRAVENOUS
  Filled 2021-08-19 (×2): qty 2

## 2021-08-19 MED ORDER — ABEMACICLIB 100 MG PO TABS
100.0000 mg | ORAL_TABLET | Freq: Two times a day (BID) | ORAL | Status: DC
  Administered 2021-08-20 – 2021-08-23 (×6): 100 mg via ORAL

## 2021-08-19 MED ORDER — ALPRAZOLAM 0.5 MG PO TABS: 0.5000 mg | ORAL_TABLET | Freq: Every day | ORAL | Status: DC | PRN

## 2021-08-19 MED ORDER — SODIUM CHLORIDE 0.9 % IV SOLN: 4.0000 mg | Freq: Once | INTRAVENOUS | Status: DC

## 2021-08-19 MED ORDER — ACETAMINOPHEN 650 MG RE SUPP: 650.0000 mg | Freq: Four times a day (QID) | RECTAL | Status: DC | PRN

## 2021-08-19 MED ORDER — FUROSEMIDE 10 MG/ML IJ SOLN
40.0000 mg | Freq: Two times a day (BID) | INTRAMUSCULAR | Status: DC
  Administered 2021-08-19 – 2021-08-21 (×4): 40 mg via INTRAVENOUS
  Filled 2021-08-19 (×4): qty 4

## 2021-08-19 MED ORDER — ONDANSETRON HCL 4 MG PO TABS: 4.0000 mg | ORAL_TABLET | Freq: Four times a day (QID) | ORAL | Status: DC | PRN

## 2021-08-19 MED ORDER — DEXAMETHASONE 0.5 MG PO TABS
1.0000 mg | ORAL_TABLET | Freq: Two times a day (BID) | ORAL | Status: DC
  Administered 2021-08-20 – 2021-08-24 (×9): 1 mg via ORAL
  Filled 2021-08-19 (×10): qty 2

## 2021-08-19 MED ORDER — LACTATED RINGERS IV BOLUS
1000.0000 mL | Freq: Once | INTRAVENOUS | Status: AC
  Administered 2021-08-19: 1000 mL via INTRAVENOUS

## 2021-08-19 MED ORDER — SPIRONOLACTONE 25 MG PO TABS
25.0000 mg | ORAL_TABLET | Freq: Every evening | ORAL | Status: DC
  Administered 2021-08-19: 25 mg via ORAL
  Filled 2021-08-19 (×2): qty 1

## 2021-08-19 MED ORDER — ZOLEDRONIC ACID 4 MG/5ML IV CONC: 4.0000 mg | Freq: Once | INTRAVENOUS | Status: DC

## 2021-08-19 MED ORDER — CALCITONIN (SALMON) 200 UNIT/ML IJ SOLN
4.0000 [IU]/kg | Freq: Two times a day (BID) | INTRAMUSCULAR | Status: AC
  Administered 2021-08-19 – 2021-08-21 (×4): 308 [IU] via SUBCUTANEOUS
  Filled 2021-08-19 (×4): qty 1.54

## 2021-08-19 MED ORDER — ENOXAPARIN SODIUM 40 MG/0.4ML IJ SOSY
40.0000 mg | PREFILLED_SYRINGE | INTRAMUSCULAR | Status: DC
  Administered 2021-08-19 – 2021-08-22 (×4): 40 mg via SUBCUTANEOUS
  Filled 2021-08-19 (×4): qty 0.4

## 2021-08-19 MED ORDER — POTASSIUM CHLORIDE CRYS ER 20 MEQ PO TBCR
40.0000 meq | EXTENDED_RELEASE_TABLET | Freq: Two times a day (BID) | ORAL | Status: DC
  Administered 2021-08-19 – 2021-08-23 (×9): 40 meq via ORAL
  Filled 2021-08-19 (×9): qty 2

## 2021-08-19 MED ORDER — SODIUM CHLORIDE 0.9 % IV SOLN: Freq: Once | INTRAVENOUS | Status: AC

## 2021-08-19 MED ORDER — MONTELUKAST SODIUM 10 MG PO TABS
10.0000 mg | ORAL_TABLET | Freq: Every day | ORAL | Status: DC
  Administered 2021-08-20 – 2021-08-23 (×4): 10 mg via ORAL
  Filled 2021-08-19 (×4): qty 1

## 2021-08-19 MED ORDER — SODIUM CHLORIDE 0.9 % IV SOLN: INTRAVENOUS | Status: DC

## 2021-08-19 MED ORDER — LACTULOSE 10 GM/15ML PO SOLN
10.0000 g | Freq: Three times a day (TID) | ORAL | Status: DC
  Administered 2021-08-19: 10 g via ORAL
  Filled 2021-08-19: qty 30

## 2021-08-19 MED ORDER — ALBUTEROL SULFATE (2.5 MG/3ML) 0.083% IN NEBU: 2.5000 mg | INHALATION_SOLUTION | Freq: Four times a day (QID) | RESPIRATORY_TRACT | Status: DC | PRN

## 2021-08-19 MED ORDER — ENSURE ENLIVE PO LIQD
237.0000 mL | Freq: Two times a day (BID) | ORAL | Status: DC
  Administered 2021-08-20 – 2021-08-23 (×7): 237 mL via ORAL

## 2021-08-19 MED ORDER — ATENOLOL 50 MG PO TABS
50.0000 mg | ORAL_TABLET | Freq: Every day | ORAL | Status: DC
  Administered 2021-08-20 – 2021-08-23 (×3): 50 mg via ORAL
  Filled 2021-08-19 (×4): qty 1

## 2021-08-19 MED ORDER — ACETAMINOPHEN 325 MG PO TABS: 650.0000 mg | ORAL_TABLET | Freq: Four times a day (QID) | ORAL | Status: DC | PRN

## 2021-08-19 NOTE — Telephone Encounter (Signed)
Received call from pt sister Christine Francis stating pt is experiencing increase in confusion and has been incontinent of urine today. States pt has not taken Lactulose x2 days.  Per MD pt to take dose of Lactulose today and to f/u in office first thing in the morning with labs and to see Mendel Ryder with [facility].  RN educated Christine Francis to administer Lactulose today and to take pt to ED ASAP if symptoms become worse prior to appt tomorrow.  Christine Francis verbalized understanding.

## 2021-08-19 NOTE — Assessment & Plan Note (Addendum)
Secondary to hyperammonemia + hypercalcemia. Plan to hold lactulose, patient had multiples BM, plan to focus on comfort care.

## 2021-08-19 NOTE — Assessment & Plan Note (Addendum)
Hydrochlorothiazide discontinued due to hypercalcemia. Continue with atenolol.

## 2021-08-19 NOTE — ED Provider Notes (Signed)
[facility] DEPT Provider Note   CSN: [id number] Arrival date & time: 08/19/21  1717     History  Chief Complaint  Patient presents with   Weakness    Christine Francis is a 61 y.o. female.  HPI   61 year old female with a history of metastatic breast cancer, hx of parathyroidectomy, currently on radiation therapy, history of hypercalcemia associated with her cancer, on dexamethasone who presents to the emergency department with altered mental status. The history is provided by the patient's sister who states that the patient has had increasing confusion and has been incontinent of urine today.  She has not been taking her lactulose over the past 2 days.  She was sent to the emergency department for altered mental status in the setting of her known cancer.  Over the past few days, she has progressively become more and more disoriented. Woke up this morning with increased confusion. Went to radiation therapy today (radiation is for palliative purposes). Concern for elevated ammonia or elevated calcium. She has mild urinary incontinence at baseline and wears diapers and is on diuretics but is completely incontinent of urine. No back pain or recent falls.  Additionally, the patient has not taken her lactulose in the past day due to diarrhea from an increase dose of her current treatment regimen.  Per chart review, recent calcium level on 08/14/21 was elevated to 14.5.  Current treatment: #1 fulvestrant given every 2 weeks x 3 doses then every 4 weeks thereafter.  Started 07/31/2021 2.  Abemaciclib 50 mg p.o. twice daily started 08/05/2021, dose increased to 100 mg twice a day 08/14/2021 3.  Zometa/Xgeva for her hypercalcemia   Home Medications Prior to Admission medications   Medication Sig Start Date End Date Taking? Authorizing Provider  abemaciclib (VERZENIO) 100 MG tablet Take 1 tablet (100 mg total) by mouth 2 (two) times daily. Swallow tablets whole. Do not  chew, crush, or split tablets before swallowing. 08/16/21  Yes Nicholas Lose, MD  albuterol (PROVENTIL HFA;VENTOLIN HFA) 108 (90 BASE) MCG/ACT inhaler Inhale 1-2 puffs into the lungs every 6 (six) hours as needed for wheezing or shortness of breath.    Yes [provider]  ALPRAZolam Duanne Moron) 0.5 MG tablet Take 0.5 mg by mouth daily as needed for anxiety.   Yes [provider]  atenolol (TENORMIN) 50 MG tablet Take 1 tablet (50 mg total) by mouth daily. 07/28/21  Yes Little Ishikawa, MD  dexamethasone (DECADRON) 1 MG tablet Take 1 tablet (1 mg total) by mouth 2 (two) times daily with a meal. 08/09/21  Yes Nicholas Lose, MD  EPINEPHrine 0.3 mg/0.3 mL IJ SOAJ injection Inject 0.3 mg into the muscle once as needed for anaphylaxis.   Yes [provider]  fluticasone furoate-vilanterol (BREO ELLIPTA) 200-25 MCG/INH AEPB Inhale 1 puff into the lungs daily as needed (shortness of breath).   Yes [provider]  furosemide (LASIX) 40 MG tablet Take 1 tablet (40 mg total) by mouth 2 (two) times daily. 07/31/21  Yes Causey, Charlestine Massed, NP  lactulose (CHRONULAC) 10 GM/15ML solution TAKE 30 MLS (20 G TOTAL) BY MOUTH 3 (THREE) TIMES DAILY. 08/19/21  Yes Nicholas Lose, MD  montelukast (SINGULAIR) 10 MG tablet Take 10 mg by mouth daily.    Yes [provider]  omeprazole (PRILOSEC OTC) 20 MG tablet Take 20 mg by mouth daily.   Yes [provider]  Phenylephrine-DM-GG-APAP (MUCINEX SINUS-MAX) 5-10-200-325 MG TABS Take 2 tablets by mouth daily as  needed (congestion).   Yes [provider]  potassium chloride SA (KLOR-CON M) 20 MEQ tablet Take 2 tablets (40 mEq total) by mouth 2 (two) times daily. 07/31/21  Yes Causey, Charlestine Massed, NP  spironolactone (ALDACTONE) 25 MG tablet Take 1 tablet (25 mg total) by mouth daily. Patient taking differently: Take 25 mg by mouth every evening. 07/31/21  Yes Causey, Charlestine Massed, NP      Allergies     Ampicillin, Dilaudid [hydromorphone hcl], Erythromycin, Penicillins, Sulfa drugs cross reactors, and Declomycin [demeclocycline]    Review of Systems   Review of Systems  Unable to perform ROS: Mental status change   Physical Exam Updated Vital Signs BP (!) 148/93 (BP Location: Right Arm)    Pulse 72    Temp 97.8 F (36.6 C) (Oral)    Resp 18    Ht 5\' 6"  (1.676 m)    Wt 75.4 kg    SpO2 99%    BMI 26.83 kg/m  Physical Exam Vitals and nursing note reviewed.  Constitutional:      General: She is not in acute distress. HENT:     Head: Normocephalic and atraumatic.  Eyes:     Conjunctiva/sclera: Conjunctivae normal.     Pupils: Pupils are equal, round, and reactive to light.  Cardiovascular:     Rate and Rhythm: Normal rate and regular rhythm.  Pulmonary:     Effort: Pulmonary effort is normal. No respiratory distress.     Breath sounds: Normal breath sounds.  Abdominal:     General: There is no distension.     Tenderness: There is no abdominal tenderness. There is no guarding.  Musculoskeletal:        General: No deformity or signs of injury.     Cervical back: Neck supple.  Skin:    Findings: No lesion or rash.  Neurological:     General: No focal deficit present.     Mental Status: She is alert. Mental status is at baseline.     Comments: MENTAL STATUS EXAM:    Orientation: Alert and oriented to person, disoriented to place and time Memory: Cooperative, follows commands well.  Language: Speech is clear and language is normal.   CRANIAL NERVES:    CN 2 (Optic): Visual fields intact to confrontation.  CN 3,4,6 (EOM): Pupils equal and reactive to light. Full extraocular eye movement without nystagmus.  CN 5 (Trigeminal): Facial sensation is normal, no weakness of masticatory muscles.  CN 7 (Facial): No facial weakness or asymmetry.  CN 8 (Auditory): Auditory acuity grossly normal.  CN 9,10 (Glossophar): The uvula is midline, the palate elevates symmetrically.  CN 11  (spinal access): Normal sternocleidomastoid and trapezius strength.  CN 12 (Hypoglossal): The tongue is midline. No atrophy or fasciculations.Marland Kitchen   MOTOR:  Muscle Strength: 5/5RUE, 5/5LUE, 5/5RLE, 5/5LLE.   COORDINATION:   Asterixis present.   SENSATION:   Intact to light touch all four extremities.       ED Results / Procedures / Treatments   Labs (all labs ordered are listed, but only abnormal results are displayed) Labs Reviewed  BASIC METABOLIC PANEL - Abnormal; Notable for the following components:      Result Value   Sodium 127 (*)    Chloride 92 (*)    Glucose, Bld 103 (*)    BUN 33 (*)    Creatinine, Ser 1.24 (*)    Calcium >15.0 (*)    GFR, Estimated 49 (*)    All other components within  normal limits  CBC - Abnormal; Notable for the following components:   WBC 13.5 (*)    RBC 3.69 (*)    Hemoglobin 10.9 (*)    HCT 33.2 (*)    RDW 20.7 (*)    nRBC 0.7 (*)    All other components within normal limits  URINALYSIS, ROUTINE W REFLEX MICROSCOPIC - Abnormal; Notable for the following components:   Leukocytes,Ua TRACE (*)    All other components within normal limits  AMMONIA - Abnormal; Notable for the following components:   Ammonia 75 (*)    All other components within normal limits  HEPATIC FUNCTION PANEL - Abnormal; Notable for the following components:   Albumin 3.0 (*)    AST 129 (*)    ALT 87 (*)    Alkaline Phosphatase 214 (*)    Total Bilirubin 3.0 (*)    Bilirubin, Direct 1.1 (*)    Indirect Bilirubin 1.9 (*)    All other components within normal limits  URIC ACID - Abnormal; Notable for the following components:   Uric Acid, Serum 9.2 (*)    All other components within normal limits  I-STAT CHEM 8, ED - Abnormal; Notable for the following components:   Sodium 129 (*)    Chloride 95 (*)    BUN 31 (*)    Creatinine, Ser 1.40 (*)    Glucose, Bld 100 (*)    Calcium, Ion 1.99 (*)    Hemoglobin 11.6 (*)    HCT 34.0 (*)    All other components within  normal limits  RESP PANEL BY RT-PCR (FLU A&B, COVID) ARPGX2  ETHANOL  OSMOLALITY  OSMOLALITY, URINE  AMMONIA  COMPREHENSIVE METABOLIC PANEL  BASIC METABOLIC PANEL  BASIC METABOLIC PANEL  CBG MONITORING, ED    EKG EKG Interpretation  Date/Time:  Monday August 19 2021 18:18:04 EST Ventricular Rate:  72 PR Interval:  150 QRS Duration: 106 QT Interval:  382 QTC Calculation: 418 R Axis:   52 Text Interpretation: Normal sinus rhythm When compared with ECG of 22-Jul-2021 15:13, PREVIOUS ECG IS PRESENT HEART RATE DECREASED SINCE Since last tracing Reconfirmed by Regan Lemming ([id number]) on 08/19/2021 11:35:50 PM  Radiology DG Chest 2 View  Result Date: 08/19/2021 CLINICAL DATA:  Metastatic breast cancer, weakness, confusion, altered level of consciousness EXAM: CHEST - 2 VIEW COMPARISON:  07/22/2021 FINDINGS: Frontal and lateral views of the chest demonstrate a stable cardiac silhouette. No acute airspace disease, effusion, or pneumothorax. No acute displaced fracture. IMPRESSION: 1. No acute intrathoracic process. Electronically Signed   By: Randa Ngo M.D.   On: 08/19/2021 18:44   CT HEAD WO CONTRAST (5MM)  Result Date: 08/19/2021 CLINICAL DATA:  Altered mental status. Stage IV breast cancer metastases to the liver and left eye and orbit. EXAM: CT HEAD WITHOUT CONTRAST TECHNIQUE: Contiguous axial images were obtained from the base of the skull through the vertex without intravenous contrast. RADIATION DOSE REDUCTION: This exam was performed according to the departmental dose-optimization program which includes automated exposure control, adjustment of the mA and/or kV according to patient size and/or use of iterative reconstruction technique. COMPARISON:  07/22/2021.  Brain MR dated 07/31/2021. FINDINGS: Brain: Normal appearing cerebral hemispheres and posterior fossa structures. Normal size and position of the ventricles. No intracranial hemorrhage, mass lesion or CT evidence of acute  infarction. Vascular: No hyperdense vessel or unexpected calcification. Skull: Normal. Negative for fracture or focal lesion. Sinuses/Orbits: The left orbital mass seen on the recent MRI is again demonstrated. Unremarkable right  orbit and paranasal sinuses. Other: Bilateral concha bullosa. IMPRESSION: 1. No acute abnormality. 2. Stable left orbital mass concerning for a metastasis. Electronically Signed   By: Claudie Revering M.D.   On: 08/19/2021 18:55    Procedures .Critical Care Performed by: Regan Lemming, MD Authorized by: Regan Lemming, MD   Critical care provider statement:    Critical care time (minutes):  50   Critical care was necessary to treat or prevent imminent or life-threatening deterioration of the following conditions:  Endocrine crisis and metabolic crisis   Critical care was time spent personally by me on the following activities:  Ordering and performing treatments and interventions, ordering and review of laboratory studies, ordering and review of radiographic studies, pulse oximetry, re-evaluation of patient's condition, review of old charts, obtaining history from patient or surrogate and examination of patient   Care discussed with: admitting provider      Medications Ordered in ED Medications  calcitonin (MIACALCIN) injection 308 Units (308 Units Subcutaneous Given 08/19/21 2115)  0.9 %  sodium chloride infusion ( Intravenous New Bag/Given 08/19/21 2041)  furosemide (LASIX) injection 40 mg (40 mg Intravenous Given 08/19/21 2040)  zoledronic acid (ZOMETA) 4 mg in sodium chloride 0.9 % 100 mL IVPB (has no administration in time range)  lactulose (CHRONULAC) 10 GM/15ML solution 20 g (20 g Oral Given 08/19/21 2117)  enoxaparin (LOVENOX) injection 40 mg (40 mg Subcutaneous Given 08/19/21 2116)  acetaminophen (TYLENOL) tablet 650 mg (has no administration in time range)    Or  acetaminophen (TYLENOL) suppository 650 mg (has no administration in time range)  ondansetron (ZOFRAN)  tablet 4 mg (has no administration in time range)    Or  ondansetron (ZOFRAN) injection 4 mg (has no administration in time range)  abemaciclib (VERZENIO) tablet 100 mg (100 mg Oral Not Given 08/19/21 2159)  dexamethasone (DECADRON) tablet 1 mg (has no administration in time range)  ALPRAZolam (XANAX) tablet 0.5 mg (has no administration in time range)  atenolol (TENORMIN) tablet 50 mg (has no administration in time range)  montelukast (SINGULAIR) tablet 10 mg (has no administration in time range)  albuterol (PROVENTIL) (2.5 MG/3ML) 0.083% nebulizer solution 2.5 mg (has no administration in time range)  fluticasone furoate-vilanterol (BREO ELLIPTA) 200-25 MCG/ACT 1 puff (has no administration in time range)  potassium chloride SA (KLOR-CON M) CR tablet 40 mEq (40 mEq Oral Given 08/19/21 2215)  feeding supplement (ENSURE ENLIVE / ENSURE PLUS) liquid 237 mL (has no administration in time range)  lactated ringers bolus 1,000 mL (0 mLs Intravenous Stopped 08/19/21 2128)  0.9 %  sodium chloride infusion ( Intravenous New Bag/Given 08/19/21 2010)    ED Course/ Medical Decision Making/ A&P Clinical Course as of 08/19/21 2338  Mon Aug 19, 2021  1820 Calcium Ionized(!!): 1.99 [JL]  1900 Ammonia(!): 75 [JL]  1948 Calcium(!!): >15.0 [JL]    Clinical Course User Index [JL] Regan Lemming, MD                           Medical Decision Making Amount and/or Complexity of Data Reviewed Labs: ordered. Decision-making details documented in ED Course. Radiology: ordered.  Risk Prescription drug management. Decision regarding hospitalization.   61 year old female with a history of metastatic breast cancer, hx of parathyroidectomy, currently on radiation therapy, history of hypercalcemia associated with her cancer, on dexamethasone who presents to the emergency department with altered mental status. The history is provided by the patient's sister who states that the  patient has had increasing confusion and  has been incontinent of urine today.  She has not been taking her lactulose over the past 2 days.  She was sent to the emergency department for altered mental status in the setting of her known cancer.  Over the past few days, she has progressively become more and more disoriented. Woke up this morning with increased confusion. Went to radiation therapy today (radiation is for palliative purposes). Concern for elevated ammonia or elevated calcium. She has mild urinary incontinence at baseline and wears diapers and is on diuretics but is completely incontinent of urine. No back pain or recent falls.  Additionally, the patient has not taken her lactulose in the past day due to diarrhea from an increase dose of her current treatment regimen.  Per chart review, recent calcium level on 08/14/21 was elevated to 14.5.  On arrival, the patient was afebrile, hemodynamically stable, saturating well on room air.  Sinus rhythm noted on cardiac telemetry.  The patient had positive findings concerning for hepatic encephalopathy with asterixis present in the setting of previous hyperammonemia and stopping her lactulose for the past day.  Concern for possible hepatic encephalopathy.  Additionally, the patient's calcium was elevated to 14.5 with concern for hypercalcemia in this patient who is currently on radiation therapy has a history of hypercalcemia of malignancy and is status post parathyroidectomy.  Patient was administered an IV fluid bolus on arrival for volume resuscitation in the setting of likely hypercalcemia.  Work-up significant for hypercalcemia with a calcium greater than 15, hyperammonemia with an ammonium level of 75.  Concern for both hepatic encephalopathy and hypercalcemia of malignancy.  CT of the head was performed revealed no acute intracranial abnormality, stable left orbital mass concerning for metastasis.  Chest x-ray was performed which was reviewed by myself and radiology and showed no acute  process.  I agree with the radiology interpretation.  Following IV fluid bolus, the patient was started on normal saline to 250 cc/h.  The patient was administered oral lactulose in the ED for her elevated ammonia and asterixis.  I spoke with Dr. Alcario Drought to discuss admission for altered mental status in the setting of hepatic encephalopathy and hypercalcemia.  He excepted the patient and admission.  Final Clinical Impression(s) / ED Diagnoses Final diagnoses:  Hyperammonemia ([place])  Hypercalcemia of malignancy  Disorientation    Rx / DC Orders ED Discharge Orders     None         Regan Lemming, MD 08/19/21 2338

## 2021-08-19 NOTE — ED Triage Notes (Signed)
Patient's sister reports that the patient has Stage IV breast cancer with liver mets and left eye orbit. Patient's sister reports that the patient has increased weakness and increased confusion. Patient's sister states the patient is incontinent of urine now. Patient has a history of hypercalcemia.

## 2021-08-19 NOTE — Assessment & Plan Note (Deleted)
Due to metastatic breast CA with liver involvement. Had decreased lactulose to 10mg  BID at home due to frequency of BMs. Got 10mg  earlier this evening in ED Ammonia 75 today. Putting pt back on 20mg  TID with first dose now, at least until we get the ammonia level down Repeat ammonia in AM.

## 2021-08-19 NOTE — Assessment & Plan Note (Addendum)
Due to metastatic breast CA with liver involvement. Ammonia level at 75 on admission. Ammonia down to 57 Plan to hold lactulose, transition to comfort care.

## 2021-08-19 NOTE — H&P (Addendum)
History and Physical    Patient: Christine Francis UKG:[id number] DOB: 1960-07-29 DOA: 08/19/2021 DOS: the patient was seen and examined on 08/19/2021 PCP: Molli Posey, MD  Patient coming from: Home  Chief Complaint:  Chief Complaint  Patient presents with   Weakness    HPI: Christine Francis is a 61 y.o. female with medical history significant of Breast CA since 2016 with mets to lung, liver, HTN.  Recently has been battling hypercalcemia this past month in setting of elevated PTHrp demonstrated on lab work in Dec (PTH low, PTHrp high).  Seeing Dr. Lindi Adie: initially had her on zometa for a while, then this failed, switched to [employer] for the past couple of weeks after calcium got to 13.7 on 1/18.  Despite frequent injections (Q7Day) of Zometa/xgeva + BID lasix + aldactone.  Presents to ED today with worsening confusion compared to baseline.  Calcium > 15 and ammonia 75.  Creat 1.29 up from 0.7 on 1/14   Review of Systems: As mentioned in the history of present illness. All other systems reviewed and are negative. Past Medical History:  Diagnosis Date   Allergy    allergy shots weekly   Anxiety    no meds currently   Asthma    Breast cancer ([place])    left - LCIS - bilat mastec   Cancer ([place])    skin- basil cancer face, chest and left leg--   GERD (gastroesophageal reflux disease)    Hypercalcemia    Hyperlipidemia    diet controlled, no meds   Hypertension    IBS (irritable bowel syndrome)    no problems currently   Personal history of colonic adenomas 11/17/2005   11/17/2005 - 3 adenomas - one  A TV adenoma was 12 mm (max)   Past Surgical History:  Procedure Laterality Date   BIOPSY BREAST  2000   right; benign   BREAST BIOPSY  2012   right breast; pre cancerous   BREAST LUMPECTOMY Right 04/1999, 11/2009   BREAST LUMPECTOMY WITH RADIOACTIVE SEED LOCALIZATION Right 11/29/2014   Procedure: BREAST LUMPECTOMY WITH RADIOACTIVE SEED LOCALIZATION;  Surgeon: Donnie Mesa, MD;  Location: [employer] [facility];  Service: General;  Laterality: Right;   BREAST RECONSTRUCTION WITH PLACEMENT OF TISSUE EXPANDER AND ALLODERM Bilateral 02/07/2019   Procedure: BILATERAL BREAST RECONSTRUCTION WITH PLACEMENT OF TISSUE EXPANDER AND ALLODERM;  Surgeon: [name], MD;  Location: [place];  Service: Plastics;  Laterality: Bilateral;   BREAST RECONSTRUCTION WITH PLACEMENT OF TISSUE EXPANDER AND ALLODERM Left 05/24/2019   Procedure: BREAST RECONSTRUCTION WITH PLACEMENT OF TISSUE EXPANDER AND ALLODERM;  Surgeon: [name], MD;  Location: [place];  Service: Plastics;  Laterality: Left;   BUNIONECTOMY  1986   bilateral   cervical cryosurgery  12/2013   COLONOSCOPY     x 2   DIAGNOSTIC LAPAROSCOPY     IR ANGIOGRAM SELECTIVE EACH ADDITIONAL VESSEL  07/22/2021   IR ANGIOGRAM SELECTIVE EACH ADDITIONAL VESSEL  07/22/2021   IR ANGIOGRAM SELECTIVE EACH ADDITIONAL VESSEL  07/22/2021   IR ANGIOGRAM VISCERAL SELECTIVE  07/22/2021   IR EMBO ART  VEN HEMORR LYMPH EXTRAV  INC GUIDE ROADMAPPING  07/22/2021   IR US GUIDE VASC ACCESS RIGHT  07/22/2021   laproscopy  1998   LIPOSUCTION Bilateral 01/31/2020   Procedure: LIPOSUCTION BILATERAL LATERAL CHEST WALL;  Surgeon: [name], MD;  Location: [place];  Service: Plastics;  Laterality: Bilateral;   MASTECTOMY W/ SENTINEL NODE BIOPSY Bilateral 02/07/2019  Procedure: BILATERAL MASTECTOMIES WITH LEFT SENTINEL LYMPH NODE BIOPSY;  Surgeon: Donnie Mesa, MD;  Location: [place];  Service: General;  Laterality: Bilateral;  PEC BLOCK   PARATHYROIDECTOMY  2005   POLYPECTOMY     RE-EXCISION OF BREAST LUMPECTOMY Left 02/24/2019   Procedure: RE-EXCISION LEFT MASTECTOMY- LEFT DERMAL FLAP;  Surgeon: Donnie Mesa, MD;  Location: [place];  Service: General;  Laterality: Left;   REMOVAL OF BILATERAL TISSUE EXPANDERS WITH PLACEMENT OF BILATERAL BREAST IMPLANTS Bilateral 01/31/2020    Procedure: REMOVAL OF BILATERAL TISSUE EXPANDERS WITH PLACEMENT OF BILATERAL SILICONE BREAST IMPLANTS;  Surgeon: [name], MD;  Location: [place];  Service: Plastics;  Laterality: Bilateral;   REMOVAL OF TISSUE EXPANDER AND PLACEMENT OF IMPLANT Left 02/24/2019   Procedure: REMOVAL OF LEFT CHEST TISSUE EXPANDER WITH ALLODERM;  Surgeon: [name], MD;  Location: [place];  Service: Plastics;  Laterality: Left;   TISSUE EXPANDER FILLING Right 02/24/2019   Procedure: RIGHT CHEST TISSUE EXPANDER FILLING;  Surgeon: [name], MD;  Location: [place];  Service: Plastics;  Laterality: Right;  (449ml 0.9% Normal Saline)   uterine bx     thickening of uterus was the reason for havin bx   WISDOM TOOTH EXTRACTION     Social History:  reports that she has never smoked. She has never used smokeless tobacco. She reports that she does not currently use alcohol after a past usage of about 1.0 standard drink per week. She reports that she does not use drugs.  Allergies  Allergen Reactions   Ampicillin Hives   Dilaudid [Hydromorphone Hcl] Nausea And Vomiting   Erythromycin Hives and Other (See Comments)   Penicillins Hives    Did it involve swelling of the face/tongue/throat, SOB, or low BP? No Did it involve sudden or severe rash/hives, skin peeling, or any reaction on the inside of your mouth or nose? Yes Did you need to seek medical attention at a hospital or doctor's office? No When did it last happen?  childhood     If all above answers are "NO", may proceed with cephalosporin use.     Sulfa Drugs Cross Reactors Hives   Declomycin [Demeclocycline] Rash and Other (See Comments)    Childhood allergy    Family History  Problem Relation Age of Onset   Hypertension Father    Cancer Father        melanoma/skin cancer   Colon polyps Father    Colon cancer Neg Hx    Rectal cancer Neg Hx    Stomach cancer Neg Hx    Esophageal  cancer Neg Hx     Prior to Admission medications   Medication Sig Start Date End Date Taking? Authorizing Provider  abemaciclib (VERZENIO) 100 MG tablet Take 1 tablet (100 mg total) by mouth 2 (two) times daily. Swallow tablets whole. Do not chew, crush, or split tablets before swallowing. 08/16/21  Yes Nicholas Lose, MD  albuterol (PROVENTIL HFA;VENTOLIN HFA) 108 (90 BASE) MCG/ACT inhaler Inhale 1-2 puffs into the lungs every 6 (six) hours as needed for wheezing or shortness of breath.    Yes [provider]  ALPRAZolam Duanne Moron) 0.5 MG tablet Take 0.5 mg by mouth daily as needed for anxiety.   Yes [provider]  atenolol (TENORMIN) 50 MG tablet Take 1 tablet (50 mg total) by mouth daily. 07/28/21  Yes Little Ishikawa, MD  dexamethasone (DECADRON) 1 MG tablet Take 1 tablet (1 mg total) by mouth  2 (two) times daily with a meal. 08/09/21  Yes Nicholas Lose, MD  EPINEPHrine 0.3 mg/0.3 mL IJ SOAJ injection Inject 0.3 mg into the muscle once as needed for anaphylaxis.   Yes [provider]  fluticasone furoate-vilanterol (BREO ELLIPTA) 200-25 MCG/INH AEPB Inhale 1 puff into the lungs daily as needed (shortness of breath).   Yes [provider]  furosemide (LASIX) 40 MG tablet Take 1 tablet (40 mg total) by mouth 2 (two) times daily. 07/31/21  Yes Causey, Charlestine Massed, NP  lactulose (CHRONULAC) 10 GM/15ML solution TAKE 30 MLS (20 G TOTAL) BY MOUTH 3 (THREE) TIMES DAILY. 08/19/21  Yes Nicholas Lose, MD  montelukast (SINGULAIR) 10 MG tablet Take 10 mg by mouth daily.    Yes [provider]  omeprazole (PRILOSEC OTC) 20 MG tablet Take 20 mg by mouth daily.   Yes [provider]  Phenylephrine-DM-GG-APAP (MUCINEX SINUS-MAX) 5-10-200-325 MG TABS Take 2 tablets by mouth daily as needed (congestion).   Yes [provider]  potassium chloride SA (KLOR-CON M) 20 MEQ tablet Take 2 tablets (40 mEq total) by mouth 2 (two) times daily. 07/31/21  Yes  Causey, Charlestine Massed, NP  spironolactone (ALDACTONE) 25 MG tablet Take 1 tablet (25 mg total) by mouth daily. Patient taking differently: Take 25 mg by mouth every evening. 07/31/21  Yes Gardenia Phlegm, NP    Physical Exam: Vitals:   08/19/21 1730 08/19/21 1733 08/19/21 1900 08/19/21 2030  BP: 121/77  125/73 110/60  Pulse: 80  66 72  Resp: 16   20  Temp: 97.8 F (36.6 C)     TempSrc: Oral     SpO2: 100%  96% 99%  Weight:  77.1 kg    Height:  5\' 6"  (1.676 m)     Constitutional: NAD, calm, comfortable Eyes: PERRL, lids and conjunctivae normal ENMT: Mucous membranes are moist. Posterior pharynx clear of any exudate or lesions.Normal dentition.  Neck: normal, supple, no masses, no thyromegaly Respiratory: clear to auscultation bilaterally, no wheezing, no crackles. Normal respiratory effort. No accessory muscle use.  Cardiovascular: Regular rate and rhythm, no murmurs / rubs / gallops. No extremity edema. 2+ pedal pulses. No carotid bruits.  Abdomen: no tenderness, no masses palpated. No hepatosplenomegaly. Bowel sounds positive.  Musculoskeletal: no clubbing / cyanosis. No joint deformity upper and lower extremities. Good ROM, no contractures. Normal muscle tone.  Skin: no rashes, lesions, ulcers. No induration Neurologic: CN 2-12 grossly intact. Sensation intact, DTR normal. Strength 5/5 in all 4.  Asterixis present. Psychiatric: Cooperative, pleasant, but oriented to person only, not time/place.   Data Reviewed:  There are no new results to review at this time.  Assessment and Plan: * Hypercalcemia- (present on admission) Due to metastatic breast CA: note that in Dec: PTHrp was elevated and PTH depressed confirming the paraneoplastic syndrome. Failed zometa as outpt -> Switched to xgeva, now failed Xgeva + lasix BID + aldactone -> now failed this with Calcium > 15 today Plan: 1) Calcitonin [place] x 2 days 2) IVF: got 1L LR bolus, NS at 250cc/hr x1 then NS at 150  cc/hr 3) diuretics: Lasix 40mg  IV BID for the moment, cont aldactone 4) DC HCTZ -> this can actually contribute to hypercalcemia and likely isnt helping (not felt to be the only / primary cause though given dec PTHrp) 5) Will re-order zometa for 2/8 (2 days from now) since Q7Days is highest frequency on label usage. 6) BMP Q6H given the IVF + diuretics above 7)  Dr. Moshe Cipro of nephrology consulted for persistent hypercalcemia failing first line therapies.  Acute metabolic encephalopathy- (present on admission) Due to some combination of hyperammonemia + hypercalcemia.  HTN (hypertension)- (present on admission) STOP HCTZ due to hypercalcemia. Cont other home BP meds  Hyperammonemia ([place])- (present on admission) Due to metastatic breast CA with liver involvement. Had decreased lactulose to 10mg  BID at home due to frequency of BMs. Got 10mg  earlier this evening in ED Ammonia 75 today. Putting pt back on 20mg  TID with first dose now, at least until we get the ammonia level down Repeat ammonia in AM.  Malignant neoplasm of upper-outer quadrant of left breast in female, estrogen receptor positive ([place]) With metastatic dz. On chemo, adding Dr. Lindi Adie to consult list. Currently causing hypercalcemia and hyperammonemia. Continue MAB therapy.       Advance Care Planning:   Code Status: Full Code  Consults: Spoke with Dr. Moshe Cipro, added Dr. Lindi Adie to list  Family Communication: Family at bedside  Severity of Illness: The appropriate patient status for this patient is INPATIENT. Inpatient status is judged to be reasonable and necessary in order to provide the required intensity of service to ensure the patient's safety. The patient's presenting symptoms, physical exam findings, and initial radiographic and laboratory data in the context of their chronic comorbidities is felt to place them at high risk for further clinical deterioration. Furthermore, it is not anticipated that  the patient will be medically stable for discharge from the hospital within 2 midnights of admission.   * I certify that at the point of admission it is my clinical judgment that the patient will require inpatient hospital care spanning beyond 2 midnights from the point of admission due to high intensity of service, high risk for further deterioration and high frequency of surveillance required.*  Author: Etta Quill., DO 08/19/2021 9:20 PM  For on call review [email or web address].

## 2021-08-19 NOTE — ED Notes (Signed)
I just got her Aldactone will send up by tube

## 2021-08-19 NOTE — Assessment & Plan Note (Addendum)
Metastatic dz. On chemo, Dr. Lindi Adie following.  Currently causing hypercalcemia and hyperammonemia. Received one dose of Xeloda Received  radiation therapy.  Transition to comfort care.

## 2021-08-19 NOTE — Assessment & Plan Note (Addendum)
Secondary to  metastatic breast CA:  paraneoplastic syndrome. Completed Calcitonin [place] x 2 days Continue to hold  HCTZ Received a dose of Zometa 2/07. Nephrology consulted, and following.  Treated with IV fluids and lasix.  Started  sensipar.  Ca still at 12, corrected by albumin 13.5 Despite medical management calcium continue to be elevated.  Dr Lindi Adie discussed with patient and family, plan to transition to comfort care.

## 2021-08-19 NOTE — Consult Note (Signed)
[facility] Renal Consultation Note  Requesting MD: gardner Indication for Consultation: hypercalcemia   HPI:  Christine Francis is a 61 y.o. female with metastatic breast cancer followed by Dr. Lindi Adie-  lately there has been an issue with hypercalcemia-  noted to have elevated PTH related peptide last month-  she had been on zometa-   got doses of 4 mg on 1/5 and 1/18-  then changed to prolia given 1/20 , 1/27 and 2/1-   in spite of that calcium continued to rise -  then patient developed confusion-  brought in and found calcium greater than 15 and crt 1.24.   I see her in her room trying to sleep-  she just states it has been a long day   Creatinine  Date/Time Value Ref Range Status  08/14/2021 09:17 AM 1.24 (H) 0.44 - 1.00 mg/dL Final  08/09/2021 09:23 AM 1.25 (H) 0.44 - 1.00 mg/dL Final  08/05/2021 12:18 PM 1.12 (H) 0.44 - 1.00 mg/dL Final  08/02/2021 12:12 PM 0.84 0.44 - 1.00 mg/dL Final  07/31/2021 08:37 AM 0.73 0.44 - 1.00 mg/dL Final  07/22/2021 11:17 AM 0.79 0.44 - 1.00 mg/dL Final  07/18/2021 09:17 AM 0.79 0.44 - 1.00 mg/dL Final  07/12/2021 09:37 AM 0.75 0.44 - 1.00 mg/dL Final  06/28/2021 01:46 PM 0.80 0.44 - 1.00 mg/dL Final  06/14/2021 12:48 PM 1.10 (H) 0.44 - 1.00 mg/dL Final  03/27/2015 09:37 AM 0.8 0.6 - 1.1 mg/dL Final   Creatinine, Ser  Date/Time Value Ref Range Status  08/19/2021 06:16 PM 1.40 (H) 0.44 - 1.00 mg/dL Final  08/19/2021 05:39 PM 1.24 (H) 0.44 - 1.00 mg/dL Final  07/27/2021 05:03 AM 0.59 0.44 - 1.00 mg/dL Final  07/26/2021 07:41 AM 0.55 0.44 - 1.00 mg/dL Final  07/25/2021 02:44 AM 0.65 0.44 - 1.00 mg/dL Final  07/24/2021 02:39 AM 0.55 0.44 - 1.00 mg/dL Final  07/23/2021 02:56 AM 0.91 0.44 - 1.00 mg/dL Final  01/27/2020 11:30 AM 0.70 0.44 - 1.00 mg/dL Final  05/20/2019 10:30 AM 0.70 0.44 - 1.00 mg/dL Final  02/08/2019 02:51 AM 1.19 (H) 0.44 - 1.00 mg/dL Final  02/03/2019 10:37 AM 0.78 0.44 - 1.00 mg/dL Final  11/28/2014 10:45 AM 0.79  0.44 - 1.00 mg/dL Final  11/14/2009 03:55 PM 0.73 0.4 - 1.2 mg/dL Final     PMHx:   Past Medical History:  Diagnosis Date   Allergy    allergy shots weekly   Anxiety    no meds currently   Asthma    Breast cancer ([place])    left - LCIS - bilat mastec   Cancer ([place])    skin- basil cancer face, chest and left leg--   GERD (gastroesophageal reflux disease)    Hypercalcemia    Hyperlipidemia    diet controlled, no meds   Hypertension    IBS (irritable bowel syndrome)    no problems currently   Personal history of colonic adenomas 11/17/2005   11/17/2005 - 3 adenomas - one  A TV adenoma was 12 mm (max)    Past Surgical History:  Procedure Laterality Date   BIOPSY BREAST  2000   right; benign   BREAST BIOPSY  2012   right breast; pre cancerous   BREAST LUMPECTOMY Right 04/1999, 11/2009   BREAST LUMPECTOMY WITH RADIOACTIVE SEED LOCALIZATION Right 11/29/2014   Procedure: BREAST LUMPECTOMY WITH RADIOACTIVE SEED LOCALIZATION;  Surgeon: Donnie Mesa, MD;  Location: [place];  Service: General;  Laterality: Right;   BREAST  RECONSTRUCTION WITH PLACEMENT OF TISSUE EXPANDER AND ALLODERM Bilateral 02/07/2019   Procedure: BILATERAL BREAST RECONSTRUCTION WITH PLACEMENT OF TISSUE EXPANDER AND ALLODERM;  Surgeon: [name], MD;  Location: [place];  Service: Plastics;  Laterality: Bilateral;   BREAST RECONSTRUCTION WITH PLACEMENT OF TISSUE EXPANDER AND ALLODERM Left 05/24/2019   Procedure: BREAST RECONSTRUCTION WITH PLACEMENT OF TISSUE EXPANDER AND ALLODERM;  Surgeon: [name], MD;  Location: [place];  Service: Plastics;  Laterality: Left;   BUNIONECTOMY  1986   bilateral   cervical cryosurgery  12/2013   COLONOSCOPY     x 2   DIAGNOSTIC LAPAROSCOPY     IR ANGIOGRAM SELECTIVE EACH ADDITIONAL VESSEL  07/22/2021   IR ANGIOGRAM SELECTIVE EACH ADDITIONAL VESSEL  07/22/2021   IR ANGIOGRAM SELECTIVE EACH ADDITIONAL VESSEL  07/22/2021   IR ANGIOGRAM  VISCERAL SELECTIVE  07/22/2021   IR EMBO ART  VEN HEMORR LYMPH EXTRAV  INC GUIDE ROADMAPPING  07/22/2021   IR US GUIDE VASC ACCESS RIGHT  07/22/2021   laproscopy  1998   LIPOSUCTION Bilateral 01/31/2020   Procedure: LIPOSUCTION BILATERAL LATERAL CHEST WALL;  Surgeon: [name], MD;  Location: [place];  Service: Plastics;  Laterality: Bilateral;   MASTECTOMY W/ SENTINEL NODE BIOPSY Bilateral 02/07/2019   Procedure: BILATERAL MASTECTOMIES WITH LEFT SENTINEL LYMPH NODE BIOPSY;  Surgeon: Donnie Mesa, MD;  Location: [place];  Service: General;  Laterality: Bilateral;  PEC BLOCK   PARATHYROIDECTOMY  2005   POLYPECTOMY     RE-EXCISION OF BREAST LUMPECTOMY Left 02/24/2019   Procedure: RE-EXCISION LEFT MASTECTOMY- LEFT DERMAL FLAP;  Surgeon: Donnie Mesa, MD;  Location: [place];  Service: General;  Laterality: Left;   REMOVAL OF BILATERAL TISSUE EXPANDERS WITH PLACEMENT OF BILATERAL BREAST IMPLANTS Bilateral 01/31/2020   Procedure: REMOVAL OF BILATERAL TISSUE EXPANDERS WITH PLACEMENT OF BILATERAL SILICONE BREAST IMPLANTS;  Surgeon: [name], MD;  Location: [place];  Service: Plastics;  Laterality: Bilateral;   REMOVAL OF TISSUE EXPANDER AND PLACEMENT OF IMPLANT Left 02/24/2019   Procedure: REMOVAL OF LEFT CHEST TISSUE EXPANDER WITH ALLODERM;  Surgeon: [name], MD;  Location: [place];  Service: Plastics;  Laterality: Left;   TISSUE EXPANDER FILLING Right 02/24/2019   Procedure: RIGHT CHEST TISSUE EXPANDER FILLING;  Surgeon: [name], MD;  Location: [place];  Service: Plastics;  Laterality: Right;  (415ml 0.9% Normal Saline)   uterine bx     thickening of uterus was the reason for havin bx   WISDOM TOOTH EXTRACTION      Family Hx:  Family History  Problem Relation Age of Onset   Hypertension Father    Cancer Father        melanoma/skin cancer   Colon polyps Father    Colon  cancer Neg Hx    Rectal cancer Neg Hx    Stomach cancer Neg Hx    Esophageal cancer Neg Hx     Social History:  reports that she has never smoked. She has never used smokeless tobacco. She reports that she does not currently use alcohol after a past usage of about 1.0 standard drink per week. She reports that she does not use drugs.  Allergies:  Allergies  Allergen Reactions   Ampicillin Hives   Dilaudid [Hydromorphone Hcl] Nausea And Vomiting   Erythromycin Hives and Other (See Comments)   Penicillins Hives    Did it involve swelling of the face/tongue/throat, SOB, or low BP? No Did it  involve sudden or severe rash/hives, skin peeling, or any reaction on the inside of your mouth or nose? Yes Did you need to seek medical attention at a hospital or doctor's office? No When did it last happen?  childhood     If all above answers are "NO", may proceed with cephalosporin use.     Sulfa Drugs Cross Reactors Hives   Declomycin [Demeclocycline] Rash and Other (See Comments)    Childhood allergy    Medications: Prior to Admission medications   Medication Sig Start Date End Date Taking? Authorizing Provider  abemaciclib (VERZENIO) 100 MG tablet Take 1 tablet (100 mg total) by mouth 2 (two) times daily. Swallow tablets whole. Do not chew, crush, or split tablets before swallowing. 08/16/21  Yes Nicholas Lose, MD  albuterol (PROVENTIL HFA;VENTOLIN HFA) 108 (90 BASE) MCG/ACT inhaler Inhale 1-2 puffs into the lungs every 6 (six) hours as needed for wheezing or shortness of breath.    Yes [provider]  ALPRAZolam Duanne Moron) 0.5 MG tablet Take 0.5 mg by mouth daily as needed for anxiety.   Yes [provider]  atenolol (TENORMIN) 50 MG tablet Take 1 tablet (50 mg total) by mouth daily. 07/28/21  Yes Little Ishikawa, MD  dexamethasone (DECADRON) 1 MG tablet Take 1 tablet (1 mg total) by mouth 2 (two) times daily with a meal. 08/09/21  Yes Nicholas Lose, MD  EPINEPHrine 0.3  mg/0.3 mL IJ SOAJ injection Inject 0.3 mg into the muscle once as needed for anaphylaxis.   Yes [provider]  fluticasone furoate-vilanterol (BREO ELLIPTA) 200-25 MCG/INH AEPB Inhale 1 puff into the lungs daily as needed (shortness of breath).   Yes [provider]  furosemide (LASIX) 40 MG tablet Take 1 tablet (40 mg total) by mouth 2 (two) times daily. 07/31/21  Yes Causey, Charlestine Massed, NP  lactulose (CHRONULAC) 10 GM/15ML solution TAKE 30 MLS (20 G TOTAL) BY MOUTH 3 (THREE) TIMES DAILY. 08/19/21  Yes Nicholas Lose, MD  montelukast (SINGULAIR) 10 MG tablet Take 10 mg by mouth daily.    Yes [provider]  omeprazole (PRILOSEC OTC) 20 MG tablet Take 20 mg by mouth daily.   Yes [provider]  Phenylephrine-DM-GG-APAP (MUCINEX SINUS-MAX) 5-10-200-325 MG TABS Take 2 tablets by mouth daily as needed (congestion).   Yes [provider]  potassium chloride SA (KLOR-CON M) 20 MEQ tablet Take 2 tablets (40 mEq total) by mouth 2 (two) times daily. 07/31/21  Yes Causey, Charlestine Massed, NP  spironolactone (ALDACTONE) 25 MG tablet Take 1 tablet (25 mg total) by mouth daily. Patient taking differently: Take 25 mg by mouth every evening. 07/31/21  Yes Causey, Charlestine Massed, NP    I have reviewed the patient's current medications.  Labs:  Results for orders placed or performed during the hospital encounter of 08/19/21 (from the past 48 hour(s))  Basic metabolic panel     Status: Abnormal   Collection Time: 08/19/21  5:39 PM  Result Value Ref Range   Sodium 127 (L) 135 - 145 mmol/L   Potassium 3.9 3.5 - 5.1 mmol/L   Chloride 92 (L) 98 - 111 mmol/L   CO2 25 22 - 32 mmol/L   Glucose, Bld 103 (H) 70 - 99 mg/dL    Comment: Glucose reference range applies only to samples taken after fasting for at least 8 hours.   BUN 33 (H) 8 - 23 mg/dL   Creatinine, Ser 1.24 (H) 0.44 - 1.00 mg/dL   Calcium >15.0 (HH)  8.9 - 10.3 mg/dL    Comment: CRITICAL RESULT CALLED  TO, READ BACK BY AND VERIFIED WITH: NASH,J. RN @1921  ON 08/19/2021 BY COHEN,K    GFR, Estimated 49 (L) >60 mL/min    Comment: (NOTE) Calculated using the CKD-EPI Creatinine Equation (2021)    Anion gap 10 5 - 15    Comment: Performed at [facility] : [facility], [place] [address], [place], [place] [address]  CBC     Status: Abnormal   Collection Time: 08/19/21  5:39 PM  Result Value Ref Range   WBC 13.5 (H) 4.0 - 10.5 K/uL   RBC 3.69 (L) 3.87 - 5.11 MIL/uL   Hemoglobin 10.9 (L) 12.0 - 15.0 g/dL   HCT 33.2 (L) 36.0 - 46.0 %   MCV 90.0 80.0 - 100.0 fL   MCH 29.5 26.0 - 34.0 pg   MCHC 32.8 30.0 - 36.0 g/dL   RDW 20.7 (H) 11.5 - 15.5 %   Platelets 266 150 - 400 K/uL   nRBC 0.7 (H) 0.0 - 0.2 %    Comment: Performed at [facility], [place] [address], [place], [place] [address]  Ammonia     Status: Abnormal   Collection Time: 08/19/21  5:59 PM  Result Value Ref Range   Ammonia 75 (H) 9 - 35 umol/L    Comment: Performed at [facility], [place] [address], [place], [place] [address]  Hepatic function panel     Status: Abnormal   Collection Time: 08/19/21  6:00 PM  Result Value Ref Range   Total Protein 8.0 6.5 - 8.1 g/dL   Albumin 3.0 (L) 3.5 - 5.0 g/dL   AST 129 (H) 15 - 41 U/L   ALT 87 (H) 0 - 44 U/L   Alkaline Phosphatase 214 (H) 38 - 126 U/L   Total Bilirubin 3.0 (H) 0.3 - 1.2 mg/dL   Bilirubin, Direct 1.1 (H) 0.0 - 0.2 mg/dL   Indirect Bilirubin 1.9 (H) 0.3 - 0.9 mg/dL    Comment: Performed at [facility], [place] [address], [place], [place] [address]  Ethanol     Status: None   Collection Time: 08/19/21  6:00 PM  Result Value Ref Range   Alcohol, Ethyl (B) <10 <10 mg/dL    Comment: (NOTE) Lowest detectable limit for serum alcohol is 10 mg/dL.  For medical purposes only. Performed at [facility], [place] [address], [place], [place] [address]   Osmolality     Status: None   Collection Time: 08/19/21   6:00 PM  Result Value Ref Range   Osmolality 290 275 - 295 mOsm/kg    Comment: Performed at [facility], [place] [address], [place], [place] [address]  Uric acid     Status: Abnormal   Collection Time: 08/19/21  6:00 PM  Result Value Ref Range   Uric Acid, Serum 9.2 (H) 2.5 - 7.1 mg/dL    Comment: Performed at [facility], [place] [address] [place], [place] [address]  Resp Panel by RT-PCR (Flu A&B, Covid) Nasopharyngeal Swab     Status: None   Collection Time: 08/19/21  6:11 PM   Specimen: Nasopharyngeal Swab; Nasopharyngeal(NP) swabs in vial transport medium  Result Value Ref Range   SARS Coronavirus 2 by RT PCR NEGATIVE NEGATIVE    Comment: (NOTE) SARS-CoV-2 target nucleic acids are NOT DETECTED.  The SARS-CoV-2 RNA is generally detectable in upper respiratory specimens during the acute phase of infection. The lowest  concentration of SARS-CoV-2 viral copies this assay can detect is 138 copies/mL. A negative result does not preclude SARS-Cov-2 infection and should not be used as the sole basis for treatment or other patient management decisions. A negative result may occur with  improper specimen collection/handling, submission of specimen other than nasopharyngeal swab, presence of viral mutation(s) within the areas targeted by this assay, and inadequate number of viral copies(<138 copies/mL). A negative result must be combined with clinical observations, patient history, and epidemiological information. The expected result is Negative.  Fact Sheet for Patients:  EntrepreneurPulse.com.au  Fact Sheet for Healthcare Providers:  IncredibleEmployment.be  This test is no t yet approved or cleared by the [place] FDA and  has been authorized for detection and/or diagnosis of SARS-CoV-2 by FDA under an Emergency Use Authorization (EUA). This EUA will remain  in effect (meaning this test can be used) for the duration of  the COVID-19 declaration under Section 564(b)(1) of the Act, 21 U.S.C.section 360bbb-3(b)(1), unless the authorization is terminated  or revoked sooner.       Influenza A by PCR NEGATIVE NEGATIVE   Influenza B by PCR NEGATIVE NEGATIVE    Comment: (NOTE) The Xpert Xpress SARS-CoV-2/FLU/RSV plus assay is intended as an aid in the diagnosis of influenza from Nasopharyngeal swab specimens and should not be used as a sole basis for treatment. Nasal washings and aspirates are unacceptable for Xpert Xpress SARS-CoV-2/FLU/RSV testing.  Fact Sheet for Patients: EntrepreneurPulse.com.au  Fact Sheet for Healthcare Providers: IncredibleEmployment.be  This test is not yet approved or cleared by the [place] FDA and has been authorized for detection and/or diagnosis of SARS-CoV-2 by FDA under an Emergency Use Authorization (EUA). This EUA will remain in effect (meaning this test can be used) for the duration of the COVID-19 declaration under Section 564(b)(1) of the Act, 21 U.S.C. section 360bbb-3(b)(1), unless the authorization is terminated or revoked.  Performed at [facility], [place] [address], [place], [place] [address]   CBG monitoring, ED     Status: None   Collection Time: 08/19/21  6:13 PM  Result Value Ref Range   Glucose-Capillary 90 70 - 99 mg/dL    Comment: Glucose reference range applies only to samples taken after fasting for at least 8 hours.  I-Stat Chem 8, ED     Status: Abnormal   Collection Time: 08/19/21  6:16 PM  Result Value Ref Range   Sodium 129 (L) 135 - 145 mmol/L   Potassium 3.9 3.5 - 5.1 mmol/L   Chloride 95 (L) 98 - 111 mmol/L   BUN 31 (H) 8 - 23 mg/dL   Creatinine, Ser 1.40 (H) 0.44 - 1.00 mg/dL   Glucose, Bld 100 (H) 70 - 99 mg/dL    Comment: Glucose reference range applies only to samples taken after fasting for at least 8 hours.   Calcium, Ion 1.99 (HH) 1.15 - 1.40 mmol/L   TCO2 27 22 - 32  mmol/L   Hemoglobin 11.6 (L) 12.0 - 15.0 g/dL   HCT 34.0 (L) 36.0 - 46.0 %   Comment NOTIFIED PHYSICIAN   Urinalysis, Routine w reflex microscopic Urine, Clean Catch     Status: Abnormal   Collection Time: 08/19/21  7:29 PM  Result Value Ref Range   Color, Urine YELLOW YELLOW   APPearance CLEAR CLEAR   Specific Gravity, Urine 1.010 1.005 - 1.030   pH 6.0 5.0 - 8.0   Glucose, UA NEGATIVE NEGATIVE mg/dL   Hgb urine dipstick NEGATIVE  NEGATIVE   Bilirubin Urine NEGATIVE NEGATIVE   Ketones, ur NEGATIVE NEGATIVE mg/dL   Protein, ur NEGATIVE NEGATIVE mg/dL   Nitrite NEGATIVE NEGATIVE   Leukocytes,Ua TRACE (A) NEGATIVE   RBC / HPF 0-5 0 - 5 RBC/hpf   WBC, UA 6-10 0 - 5 WBC/hpf   Bacteria, UA NONE SEEN NONE SEEN   Squamous Epithelial / LPF 0-5 0 - 5   Mucus PRESENT    Hyaline Casts, UA PRESENT     Comment: Performed at [facility], [place] [address], [place], [place] [address]  Osmolality, urine     Status: None   Collection Time: 08/19/21  7:29 PM  Result Value Ref Range   Osmolality, Ur 354 300 - 900 mOsm/kg    Comment: Performed at [place] [address] [place], [place] [address]     ROS:  Review of systems not obtained due to patient factors.  Physical Exam: Vitals:   08/19/21 2030 08/19/21 2139  BP: 110/60 (!) 148/93  Pulse: 72 72  Resp: 20 18  Temp:  97.8 F (36.6 C)  SpO2: 99% 99%     General:  looks younger than stated age-  restless-  some repeating phrases-  mild distress HEENT: PERRLA, EOMI, mucous membranes moist   Neck: no JVD Heart: RRR Lungs: moslty clear Abdomen: soft, non tender Extremities:trace peripheral edema Skin: warm and dry Neuro: mild confusion-  repeating phrases  Assessment/Plan: 61 year old WF with metastatic breast CA-  complication of hypercalcemia in the setting of high PTH related peptide 1.Renal- some AKi related to the hypercalcemia 2. Hypercalcemia-  due to metastatic breast CA and elevated PTH  related peptide.  It has been managed very appropriately in the OP setting but hypercalcemia persists and is worse.  I have talked over with Dr. Alcario Drought-  will give IVF and lasix as well as treat with calcitonin.  Will also repeat the zometa since has been a couple of weeks since last given.  The prolia as I read is to be given weekly times 3 which has been completed , then q 4 weeks after.  The hope will be that with the measures above can improve calcium enough to be able to maintain as OP-  also her cancer is still being treated which should help the issue as well.  I read that dialysis is a last line therapy for hypercalcemia-  I personally have never used dialysis for this but we will follow in case is needed 2. Hypertension/volume  - slightly overloaded but BP OK-  giving fluids and lasix-  apparently was on HCTZ-  has been held.  Giving aldactone but not sure that will be effective in this scenario and may lower BP-  will stop    Louis Meckel 08/19/2021, 11:09 PM

## 2021-08-20 ENCOUNTER — Other Ambulatory Visit: Payer: Self-pay | Admitting: Hematology and Oncology

## 2021-08-20 ENCOUNTER — Telehealth: Payer: Self-pay

## 2021-08-20 ENCOUNTER — Other Ambulatory Visit (HOSPITAL_COMMUNITY): Payer: Self-pay

## 2021-08-20 ENCOUNTER — Inpatient Hospital Stay: Payer: 59 | Admitting: Adult Health

## 2021-08-20 ENCOUNTER — Ambulatory Visit
Admission: RE | Admit: 2021-08-20 | Discharge: 2021-08-20 | Disposition: A | Discharge status: Home / Self Care | Payer: 59 | Source: Ambulatory Visit | Attending: Radiation Oncology | Admitting: Radiation Oncology

## 2021-08-20 ENCOUNTER — Ambulatory Visit: Payer: 59

## 2021-08-20 ENCOUNTER — Inpatient Hospital Stay: Payer: 59

## 2021-08-20 DIAGNOSIS — C50412 Malignant neoplasm of upper-outer quadrant of left female breast: Secondary | ICD-10-CM

## 2021-08-20 DIAGNOSIS — E440 Moderate protein-calorie malnutrition: Secondary | ICD-10-CM

## 2021-08-20 DIAGNOSIS — Z170 Estrogen receptor positive status [ER+]: Secondary | ICD-10-CM

## 2021-08-20 DIAGNOSIS — T451X5A Adverse effect of antineoplastic and immunosuppressive drugs, initial encounter: Secondary | ICD-10-CM

## 2021-08-20 DIAGNOSIS — E871 Hypo-osmolality and hyponatremia: Secondary | ICD-10-CM

## 2021-08-20 DIAGNOSIS — D6481 Anemia due to antineoplastic chemotherapy: Secondary | ICD-10-CM

## 2021-08-20 DIAGNOSIS — N179 Acute kidney failure, unspecified: Secondary | ICD-10-CM

## 2021-08-20 DIAGNOSIS — R748 Abnormal levels of other serum enzymes: Secondary | ICD-10-CM

## 2021-08-20 LAB — BASIC METABOLIC PANEL
Anion gap: 9 (ref 5–15)
Anion gap: 9 (ref 5–15)
Anion gap: 8 (ref 5–15)
BUN: 36 mg/dL — ABNORMAL HIGH (ref 8–23)
BUN: 36 mg/dL — ABNORMAL HIGH (ref 8–23)
BUN: 37 mg/dL — ABNORMAL HIGH (ref 8–23)
CO2: 22 mmol/L (ref 22–32)
CO2: 22 mmol/L (ref 22–32)
CO2: 20 mmol/L — ABNORMAL LOW (ref 22–32)
Calcium: 13.6 mg/dL (ref 8.9–10.3)
Calcium: 12.7 mg/dL — ABNORMAL HIGH (ref 8.9–10.3)
Calcium: 13.2 mg/dL (ref 8.9–10.3)
Chloride: 98 mmol/L (ref 98–111)
Chloride: 100 mmol/L (ref 98–111)
Chloride: 102 mmol/L (ref 98–111)
Creatinine, Ser: 1.25 mg/dL — ABNORMAL HIGH (ref 0.44–1.00)
Creatinine, Ser: 1.18 mg/dL — ABNORMAL HIGH (ref 0.44–1.00)
Creatinine, Ser: 1.27 mg/dL — ABNORMAL HIGH (ref 0.44–1.00)
GFR, Estimated: 49 mL/min — ABNORMAL LOW (ref >60)
GFR, Estimated: 52 mL/min — ABNORMAL LOW (ref >60)
GFR, Estimated: 48 mL/min — ABNORMAL LOW (ref >60)
Glucose, Bld: 87 mg/dL (ref 70–99)
Glucose, Bld: 82 mg/dL (ref 70–99)
Glucose, Bld: 133 mg/dL — ABNORMAL HIGH (ref 70–99)
Potassium: 3.9 mmol/L (ref 3.5–5.1)
Potassium: 4 mmol/L (ref 3.5–5.1)
Potassium: 4.2 mmol/L (ref 3.5–5.1)
Sodium: 129 mmol/L — ABNORMAL LOW (ref 135–145)
Sodium: 131 mmol/L — ABNORMAL LOW (ref 135–145)
Sodium: 130 mmol/L — ABNORMAL LOW (ref 135–145)

## 2021-08-20 LAB — COMPREHENSIVE METABOLIC PANEL
ALT: 75 U/L — ABNORMAL HIGH (ref 0–44)
AST: 114 U/L — ABNORMAL HIGH (ref 15–41)
Albumin: 2.8 g/dL — ABNORMAL LOW (ref 3.5–5.0)
Alkaline Phosphatase: 192 U/L — ABNORMAL HIGH (ref 38–126)
Anion gap: 9 (ref 5–15)
BUN: 35 mg/dL — ABNORMAL HIGH (ref 8–23)
CO2: 23 mmol/L (ref 22–32)
Calcium: 14.5 mg/dL (ref 8.9–10.3)
Chloride: 97 mmol/L — ABNORMAL LOW (ref 98–111)
Creatinine, Ser: 1.19 mg/dL — ABNORMAL HIGH (ref 0.44–1.00)
GFR, Estimated: 52 mL/min — ABNORMAL LOW (ref >60)
Glucose, Bld: 124 mg/dL — ABNORMAL HIGH (ref 70–99)
Potassium: 3.7 mmol/L (ref 3.5–5.1)
Sodium: 129 mmol/L — ABNORMAL LOW (ref 135–145)
Total Bilirubin: 2.9 mg/dL — ABNORMAL HIGH (ref 0.3–1.2)
Total Protein: 7.5 g/dL (ref 6.5–8.1)

## 2021-08-20 LAB — AMMONIA: Ammonia: 81 umol/L — ABNORMAL HIGH (ref 9–35)

## 2021-08-20 MED ORDER — CAPECITABINE 500 MG PO TABS
1000.0000 mg | ORAL_TABLET | Freq: Two times a day (BID) | ORAL | 3 refills | Status: DC
  Filled 2021-08-20: qty 56, 14d supply, fill #0

## 2021-08-20 MED ORDER — ZOLEDRONIC ACID 4 MG/5ML IV CONC
4.0000 mg | Freq: Once | INTRAVENOUS | Status: AC
  Administered 2021-08-20: 4 mg via INTRAVENOUS
  Filled 2021-08-20: qty 5

## 2021-08-20 MED ORDER — BOOST / RESOURCE BREEZE PO LIQD CUSTOM
1.0000 | Freq: Two times a day (BID) | ORAL | Status: DC
  Administered 2021-08-20 – 2021-08-22 (×3): 1 via ORAL

## 2021-08-20 NOTE — Discharge Instructions (Signed)
Maximizing Nutrition During Cancer Treatment (2022)  Eating enough calories and protein and drinking enough fluid can be challenging during cancer treatment. Your body uses calories and protein for fuel to support healthy organs, muscle repair, and daily activity. Often your body will need more calories and protein to heal tissues and fight infections during cancer treatment. Low appetite, fatigue, and treatment-related side effects can make it difficult to prepare foods and find foods that sound appealing. There are simple ways to encourage adequate intake that will help to provide the nutrition you need during treatment.  Tips Issue Nutritional Considerations  Low appetite Eat 6 to 8 small meals and snacks throughout the day Schedule your mealtimes instead of waiting to be hungry Do not skip meals Carry snacks with you so that food is always available Eat a bigger meal at the time of day when your appetite is largest Choose milkshakes, smoothies or high-calorie, high-protein nutritional drinks if you don't feel like eating a meal  Feeling full quickly Eat 6 to 8 small meals and snacks throughout the day instead of 3 large meals Avoid drinking large amounts of fluids with meals, rather sip on fluids throughout the day Make every bite count by choosing high-calorie and high-protein foods when possible  Difficulty eating meats Try ground or chopped meats mixed into a sauce or in a soup or casserole Use other protein sources including eggs, milk, cheese, yogurt, beans, cottage cheese, nuts and nut butter, and tofu Include a protein source at each meal and snack Drink high-calorie, high-protein nutritional drinks to help boost your protein intake  Taste changes Try enhancing the flavor of foods with herbs, spices, seasoning blends, marinades, and sauces Season foods with tart flavors such as citrus fruits, vinegar-based dressings, and pickled foods. You may need to avoid these foods if you are  experiencing sore mouth or throat. Add sugar to improve the flavor of salty foods Add salt to decrease the sweetness of sugary foods Use lemon juice on foods to mask metallic taste Use plastic utensils if you experience a metallic taste Practice good oral hygiene Rinse mouth with baking soda and saltwater rinse (1 quart water mixed with  teaspoon of salt and 1 teaspoon of baking soda) before and after meals to cleanse the tastebuds  Nausea and vomiting Attempt to eat small, frequent meals Choose bland, starchy foods and clear liquids, all served at room temperature Avoid greasy, high-fat foods and highly seasoned foods Drink liquids between meals rather than with meals Limit exposure to food odors by avoiding food preparation areas and using exhaust fans or opening windows Time meals to when nausea medications are most effective  Weight loss Increase the calories in your foods: Use milk instead of water for soups and cooked cereals Add butter or oil to potatoes, rice, pasta, vegetables, sauces, soups, or casseroles Spread butter or oil on bread for sandwiches Use mayonnaise or salad dressing on sandwiches and in dips or sauces Add gravy or buttery sauces to meats and vegetables Snack on avocado, guacamole, nuts, nut butter, olives or hummus Add extra protein to soups by slicing cooked egg white into the soup  Dehydration Drink 8-10 cups of fluid per day Use cues, like a timer, as a reminder to have something to drink Drink small amounts of fluid as often as possible Keep a filled water bottle nearby at all times Try flavored waters, milks, sports drinks, fruits juice, caffeine-free soft drinks, and other beverages to increase the variety Eat foods that are  high in fluids like fresh fruit, broth, soups, gelatin, fruit ice, popsicles, ice cream, sorbet, sherbet, milkshakes, and high-calorie/high-protein nutritional drinks  Fatigue makes it difficult to prepare meals Choose foods that are  ready to eat or easy to make to minimize time for food preparation including pudding cups, peanut butter, tuna, cereal bars, trail mix, cheese and crackers and eggs Use ready-to-drink high calorie/high protein nutritional drinks Make extra food and freeze the leftovers when you are able Keep meal preparation simple by using prepared and frozen foods Accept offers from family and friends to prepare meals or grocery shop for you Let your friends and family who help with meal preparation know your food preferences and needs   Easy to Prepare High-Calorie, High-Protein Sample 1-Day Menu View Nutrient Info Breakfast 1 frozen waffle, toasted 2 tablespoons peanut butter 1 banana, sliced 1 cup whole milk  Morning Snack  cup yogurt  cup strawberries  cup granola  Lunch Tuna salad sandwich made with: 2 slices bread, whole wheat 3 ounces tuna 1 tablespoon mayonnaise, mixed with tuna 1 slice of cheese One 7.9-KWIOX, ready-to-eat pudding cup  Afternoon Snack 1 stick string cheese 5 whole wheat crackers  Evening Meal 1 individually portioned frozen chicken pot pie, baked  cup of peas, cooked 1 teaspoon margarine, for peas  Evening Snack 1 cup high-calorie, high-protein nutrition drink 1 chocolate chip cookie  Handout provided from the Academy of Nutrition and Dietetics (2022)

## 2021-08-20 NOTE — Telephone Encounter (Signed)
Oral Oncology Patient Advocate Encounter   Received notification from [facility] that prior authorization for Xeloda is required.   PA submitted on CoverMyMeds Key [id number] Status is pending   Oral Oncology Clinic will continue to follow.  [facility] Patient [place] Phone [phone] Fax [phone] 08/20/2021 3:26 PM

## 2021-08-20 NOTE — Progress Notes (Signed)
°  Progress Note   Patient: Christine Francis HCW:[id number] DOB: Feb 15, 1961 DOA: 08/19/2021     1 DOS: the patient was seen and examined on 08/20/2021   Brief hospital course: 61 year old past medical history significant for breast cancer since 2016 with mets to lungs, liver, hypertension.  Treated as an outpatient for hypercalcemia.  Presents with confusion, hypercalcemia calcium level 15, hyperammonemia ammonia 75.  AKI creatinine 1.2 from prior baseline 0.7.  Patient is started on IV fluids, Lasix.  Nephrology was consulted to assist with hypercalcemia.  Assessment and Plan: * Hypercalcemia- (present on admission) Secondary to  metastatic breast CA:  paraneoplastic syndrome. Continue with Calcitonin [place] x 2 days Continue with Laisx and IV fluids.  Continue to hold  HCTZ Will schedule zometa for today.  Nephrology consulted, and following.   Acute metabolic encephalopathy- (present on admission) Secondary to  hyperammonemia + hypercalcemia. Patient today alert and oriented x3.  She has improved, although sister does not think that she is back to her baseline. Continue with treatment of hypercalcemia, continue with lactulose.  AKI (acute kidney injury) ([place]) Likely related to hypovolemia, hypercalcemia.  Prior Cr month ago 0.4. Cr peak to 1.4.  Continue with IV fluids.   Malignant neoplasm of upper-outer quadrant of left breast in female, estrogen receptor positive ([place]) Metastatic dz. On chemo,  Dr. Lindi Adie will see patient in consultation.  Currently causing hypercalcemia and hyperammonemia. Continue MAB therapy. Received radiation therapy today.   HTN (hypertension)- (present on admission) Continue to hold on, hydrochlorothiazide. Hydrochlorothiazide discontinued due to hypercalcemia. Continue with atenolol. Currently on Lasix for hypercalcemia  Protein-calorie malnutrition, moderate (HCC) Start ensure.   Anemia associated with chemotherapy Hb stable at 11.  Prior HB  range: 9.3-10.  Abnormal transaminases Likely related to liver mets.  Follow trend.   Hyperammonemia ([place])- (present on admission) Due to metastatic breast CA with liver involvement. Home lactulose dose recently decreased. Ammonia level at 75. Continue with Lactulose increased to 3 times daily.         Subjective: She is alert, oriented times 3.  Per sister she has improved but is not back to baseline.  She report poor appetite. Per sister decadron help with appetite in past.     Physical Exam: Vitals:   08/19/21 2139 08/20/21 0128 08/20/21 0446 08/20/21 1452  BP: (!) 148/93 137/74 139/82 125/65  Pulse: 72 72 73 72  Resp: 18 18 18 16   Temp: 97.8 F (36.6 C) 97.6 F (36.4 C) 97.6 F (36.4 C) (!) 97.4 F (36.3 C)  TempSrc: Oral Oral Oral Oral  SpO2: 99% 98% 98% 94%  Weight: 75.4 kg     Height: 5\' 6"  (1.676 m)        Data Reviewed:  CBC B med review  Family Communication: Sister updated at bedside  Disposition: Status is: Inpatient Remains inpatient appropriate because: For management of hypercalcemia, requiring IV Lasix and IV fluids          Planned Discharge Destination: Home     Time spent: 45 minutes  Author: Elmarie Shiley, MD 08/20/2021 2:57 PM  For on call review [email or web address].

## 2021-08-20 NOTE — Progress Notes (Signed)
Chaplain spent some time with [name] and her sister.  They shared about her healthcare journey for the last month and how hard it has been on [name].  She is looking for just some relief.  [name]'s mantra to get by has been, "Take it one day at a time."  She also credits the support and faith of her family with helping her.  [name] voiced that she has been so tired and exhausted but she is going to keep going.   Chaplain affirmed [name]'s journey that has been hard on her in multiple areas.  Chaplain offered a scripture to go along with [name]'s mantra.  Chaplain worked to also highlight some good things happening in [name]'s life and around her.  Chaplain offered listening, support and presence.    08/20/21 1700  Clinical Encounter Type  Visited With Patient and family together  Visit Type Initial;Spiritual support

## 2021-08-20 NOTE — Assessment & Plan Note (Addendum)
Prior HB range: 9.3-10. Hb stable.

## 2021-08-20 NOTE — Hospital Course (Addendum)
61 year old past medical history significant for breast cancer since 2016 with mets to lungs, liver, hypertension.  Treated as an outpatient for hypercalcemia.  Presents with confusion, hypercalcemia calcium level 15, hyperammonemia ammonia 75.  AKI creatinine 1.2 from prior baseline 0.7.  Patient was started on IV fluids, Lasix.  Nephrology was consulted to assist with hypercalcemia. Calcium level fluctuates.  Received a dose of Zometa. Treated with calcitonin.  Develops mild hematochezia 2/10. Has history of hemorrhoids. IV protonix started. GI consulted, started on treatment for hemorrhoids.   Calcium continue to be elevated despite medical treatment. Dr Lindi Adie discussed with patient and family, patient is tired, feels weak, plan to transition to comfort care. [facility]. Comfort care orders placed.

## 2021-08-20 NOTE — Progress Notes (Signed)
HEMATOLOGY-ONCOLOGY PROGRESS NOTE  SUBJECTIVE: Patient was admitted to the hospital with mental status changes from severe hypercalcemia.  As an outpatient we have been battling with hypercalcemia with almost weekly infusions of bisphosphonate therapy but still her calcium levels have continued to remain high.  She is being currently treated for metastatic breast cancer with Verzenio along with letrozole. Liver biopsy confirmed this metastatic disease.   Oncology History  Malignant neoplasm of upper-outer quadrant of left breast in female, estrogen receptor positive ([place])  07/19/2014 Initial Biopsy   Rt.Breast Biopsy: LCIS   11/29/2014 Surgery   Right lumpectomy: LCIS with fibrocystic changes with usual ductal hyperplasia,PASH, 0.8 cm margins for LCIS   02/06/2015 -  Anti-estrogen oral therapy   tamoxifen 20 mg daily   12/08/2018 Relapse/Recurrence   Left breast 2 cm mass at 7 o clock position: ILC grade 2, ER 90%, PR 30%, Her 2 neg, Ki 67: 15% T1CN0 stage 1A   02/07/2019 Surgery   Bilateral mastectomies with reconstruction ([place]) Right breast: atypical lobular hyperplasia and no evidence of carcinoma in 1 lymph node. Left breast: invasive lobular carcinoma with LCIS, grade 2, 2.1cm, 2 lymph nodes negative for carcinoma, and invasive carcinoma broadly present at the anterior margin.    02/07/2019 Cancer Staging   Staging form: Breast, AJCC 8th Edition - Pathologic stage from 02/07/2019: Stage IA (pT2, pN0, cM0, G2, ER+, PR+, HER2-, Oncotype DX score: 24) - Signed by Gardenia Phlegm, NP on 03/02/2019    02/07/2019 Oncotype testing   24/10%; no benefit from chemo   02/2019 -  Anti-estrogen oral therapy   Anastrozole daily switched to letrozole 09/26/2019   06/28/2021 Progression   CT parathyroid: 06/28/2021: 9 mm soft tissue nodule possible parathyroid adenoma, 6 mm left lobe of the thyroid nodule PET CT scan 07/05/2021: Diffuse hepatic hypermetabolism concerning for  liver metastases versus hepatocellular carcinoma, hypermetabolic left axillary lymph node suspicious for metastatic disease, heterogeneous bone marrow activity nonspecific, portal vein hypertension   07/19/2021 Initial Biopsy   Liver needle core biopsy: metastatic carcinoma, compatible with breast primary.  ER 95%+, PR negative, Ki-67 15%, HER-2 equivocal by IHC, FISH negative.     Liver metastases ([place])  06/28/2021 Progression   CT parathyroid: 06/28/2021: 9 mm soft tissue nodule possible parathyroid adenoma, 6 mm left lobe of the thyroid nodule PET CT scan 07/05/2021: Diffuse hepatic hypermetabolism concerning for liver metastases versus hepatocellular carcinoma, hypermetabolic left axillary lymph node suspicious for metastatic disease, heterogeneous bone marrow activity nonspecific, portal vein hypertension   07/19/2021 Initial Biopsy   Liver needle core biopsy: metastatic carcinoma, compatible with breast primary.  ER 95%+, PR negative, Ki-67 15%, HER-2 equivocal by IHC, FISH negative.     07/31/2021 Initial Diagnosis   Liver metastases ([place])   08/05/2021 Cancer Staging   Staging form: Liver, AJCC 8th Edition - Pathologic: Stage IVB (pM1) - Signed by Gardenia Phlegm, NP on 08/05/2021      OBJECTIVE: REVIEW OF SYSTEMS:   As above positive for mental status changes and confusion. Today she is able to converse normally.  She is awake alert and oriented.   PHYSICAL EXAMINATION: ECOG PERFORMANCE STATUS: 3 - Symptomatic, >50% confined to bed  Vitals:   08/20/21 0446 08/20/21 1452  BP: 139/82 125/65  Pulse: 73 72  Resp: 18 16  Temp: 97.6 F (36.4 C) (!) 97.4 F (36.3 C)  SpO2: 98% 94%   Filed Weights   08/19/21 1733 08/19/21 2139  Weight: 170 lb (77.1 kg) 166  lb 3.6 oz (75.4 kg)     LABORATORY DATA:  I have reviewed the data as listed CMP Latest Ref Rng & Units 08/20/2021 08/20/2021 08/20/2021  Glucose 70 - 99 mg/dL 82 87 124(H)  BUN 8 - 23 mg/dL 36(H) 36(H) 35(H)   Creatinine 0.44 - 1.00 mg/dL 1.18(H) 1.25(H) 1.19(H)  Sodium 135 - 145 mmol/L 131(L) 129(L) 129(L)  Potassium 3.5 - 5.1 mmol/L 4.0 3.9 3.7  Chloride 98 - 111 mmol/L 100 98 97(L)  CO2 22 - 32 mmol/L _0 Calcium 8.9 - 10.3 mg/dL 12.7(H) 13.6(HH) 14.5(HH)  Total Protein 6.5 - 8.1 g/dL - - 7.5  Total Bilirubin 0.3 - 1.2 mg/dL - - 2.9(H)  Alkaline Phos 38 - 126 U/L - - 192(H)  AST 15 - 41 U/L - - 114(H)  ALT 0 - 44 U/L - - 75(H)    Lab Results  Component Value Date   WBC 13.5 (H) 08/19/2021   HGB 11.6 (L) 08/19/2021   HCT 34.0 (L) 08/19/2021   MCV 90.0 08/19/2021   PLT 266 08/19/2021   NEUTROABS 12.0 (H) 08/14/2021    ASSESSMENT AND PLAN: 1.  Severe hypercalcemia of malignancy: Because we are concerned that the Verzenio is not improving her malignant process quickly enough, recommended that we switch her to Xeloda. Since the low-dose chemotherapy we might get a faster response.  We could once again go back to Verzenio once her disease is under slightly better control. I discussed with the patient the risks and benefits of Xeloda including the risk of diarrhea and hand-foot syndrome.  We will start her at 1000 mg p.o. twice daily 14 days on and 7 days off. I sent a prescription to [facility] to work on authorization and providing the medicine for her.  2. she can continue Verzenio while she is in the hospital with her own supply.  Once she goes home she will change to Xarelto.  3.  Liver dysfunction with elevated ammonia levels

## 2021-08-20 NOTE — Progress Notes (Signed)
Initial Nutrition Assessment  DOCUMENTATION CODES:   Non-severe (moderate) malnutrition in context of chronic illness  INTERVENTION:  -Boost Breeze, BID, between meals. Each supplement provides 250 kcal and 9 grams of protein  -Continue Ensure Enlive BID, between meals. Each supplement provides 350 kcal and 20 grams of protein -Provided diet education handout "Maximizing Nutrition During Cancer Treatment," to pt's discharge instructions    NUTRITION DIAGNOSIS:   Moderate Malnutrition related to chronic illness (breast cancer with mets to liver and lung) as evidenced by mild fat depletion, mild muscle depletion, moderate fat depletion, energy intake < or equal to 75% for > or equal to 1 month.   GOAL:   Patient will meet greater than or equal to 90% of their needs   MONITOR:   PO intake, Supplement acceptance, Weight trends, Labs  REASON FOR ASSESSMENT:   Malnutrition Screening Tool    ASSESSMENT:    Pt is a 61 y.o. female with medical history significant of HTN, GERD, HLD, hx of parathyroidectomy, breast CA since 2016 with mets to liver and lung, currently on XRT therapy, hx of hypercalcemia associated with her cancer, who presented to the ED for weakness and altered mental status in the setting of her known cancer and was admitted for hypercalcemia.   Per chart review, pt has mild urinary incontinence at baseline, wears diapers and is on diuretics but is completely incontinent of urine at this time.   Visited with pt at bedside. Pt's sister present in room. Pt alert and oriented to person. Per pt and pt's sister, pt has endorsed a poor appetite over the last one month. Pt reports that she does not have a good appetite today and did not eat anything for breakfast today. Per pt's sister, pt has been drinking Ensure and Boost products over the last one month at home and tolerating well. Liquid ONS has been pt's primary way of getting in nutrients and calories due to nausea which  has caused decreased appetite. Per pt's sister, pt is currently undergoing XRT treatment and has been experiencing metallic taste. Pt's family member reports that she tries to add spices to foods for the pt such as cinnamon to banana bread and french toast to help reduce metallic taste. Pt's sister reports that pt only ate a few bits of the banana bread on this past Sunday. Pt and pt's sister reports that pt tolerates soft foods well, but has some difficulty with chewing/swallowing foods that are not tender. Pt's sister reports that she likes soups, such as broccoli cheddar soup, mac and cheese (soft), yogurt. Per pt's sister, any food that requires a lot of chewing, she will not want to eat.   Per pt, pt lives at home with family. Pt's sister reports that prior to January, pt was ambulating on her own at baseline and was getting around without any problems. Since January, pt uses a walker at baseline and pt's sister reports that she uses a wheelchair when taking pt to radiation treatments.   Discussed with pt and pt's sister the importance of adequate po intake for energy/strength during cancer/cancer treatments. Encouraged pt to continue drinking liquid ONS when appetite is poor as this provides calories and protein. Pt reports that she would like to continue getting the [employer] but is agreeable to trying Boost Breeze (wild-berry flavored). Discussed with pt and pt's family tips on managing taste changes when it comes to experiencing metallic taste. Discussed with pt and pt's family attaching handout from [employer]  [employer], "Maximizing Nutrition During Cancer Treatment (2022)" to pt's discharge instructions.   Weight hx reviewed. Per pt, pt reports her UBW is 180-190#. Pt's family member reports that that they did a bed weight this morning and pt's weight was 75.3 kg (165.6 kg). Per pt's sister, pt has lost 6# in the last week. Pt also reports that pt had two prior paracentesis and 10  liters of fluid was taken off.   Medications reviewed. Calcitonin, Ensure Enlive BID, lasix, lactulose, klor-con m   Labs reviewed. Calcium: 12.7   NUTRITION - FOCUSED PHYSICAL EXAM:  Flowsheet Row Most Recent Value  Orbital Region Mild depletion  Upper Arm Region Mild depletion  Thoracic and Lumbar Region No depletion  Buccal Region Mild depletion  Temple Region Mild depletion  Clavicle Bone Region No depletion  Clavicle and Acromion Bone Region Mild depletion  Scapular Bone Region Mild depletion  Dorsal Hand Mild depletion  Patellar Region Moderate depletion  Anterior Thigh Region Moderate depletion  Posterior Calf Region Moderate depletion  Edema (RD Assessment) None  Hair Reviewed  Eyes Reviewed  Mouth Reviewed  Skin Reviewed  Nails Reviewed       Diet Order:   Diet Order             Diet regular Room service appropriate? Yes; Fluid consistency: Thin  Diet effective now                   EDUCATION NEEDS:   Education needs have been addressed  Skin:  Skin Assessment: Reviewed RN Assessment  Last BM:  2/6  Height:   Ht Readings from Last 1 Encounters:  08/19/21 _0  (1.676 m)    Weight:   Wt Readings from Last 1 Encounters:  08/19/21 75.4 kg    Ideal Body Weight:  59.1 kg  BMI:  Body mass index is 26.83 kg/m.  Estimated Nutritional Needs:   Kcal:  2200 - 2400  Protein:  110 - 125 grams  Fluid:  < 2.2 L    Maryruth Hancock, Dietetic Intern 08/20/2021 3:16 PM

## 2021-08-20 NOTE — Progress Notes (Signed)
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 She noticed improvement in her vision more out of the left eye.   Plan: The patient will receive a call in about one month from the radiation oncology department. She will continue follow up with Dr. Lindi Adie as well.   ________________________________________________    Carola Rhine, [facility]

## 2021-08-20 NOTE — Progress Notes (Signed)
[facility] Progress Note  Subjective: pt seen in room, parents at bedside  Vitals:   08/19/21 2139 08/20/21 0128 08/20/21 0446 08/20/21 1452  BP: (!) 148/93 137/74 139/82 125/65  Pulse: 72 72 73 72  Resp: 18 18 18 16   Temp: 97.8 F (36.6 C) 97.6 F (36.4 C) 97.6 F (36.4 C) (!) 97.4 F (36.3 C)  TempSrc: Oral Oral Oral Oral  SpO2: 99% 98% 98% 94%  Weight: 75.4 kg     Height: 5\' 6"  (1.676 m)       Exam: General:  looks younger than stated age-  restless-  some repeating phrases-  mild distress HEENT: PERRLA, EOMI, mucous membranes moist    Neck: no JVD Heart: RRR Lungs: moslty clear Abdomen: soft, non tender Extremities:trace peripheral edema Skin: warm and dry Neuro: mild confusion-  repeating phrases   Assessment/Plan: 61 year old WF with metastatic breast CA-  complication of hypercalcemia in the setting of high PTH related peptide 1.Renal- some mild  AKI related to the hypercalcemia 2. Hypercalcemia-  due to metastatic breast CA and elevated PTH related peptide.  It has been managed very appropriately in the OP setting but hypercalcemia persists and worsened.  We started IVF's at 200 cc/hr and IV  lasix\, also is getting 4 dose of calcitonin.  Will repeated the zometa since has been a couple of weeks since last given.  The prolia is to be given weekly times 3 which has been completed , then q 4 weeks thereafter.  The hope will be that with the measures above can improve calcium enough to be able to maintain as OP.  Also the cancer is still being treated which should help the issue as well. Dialysis is reportedly a  last line therapy for hypercalcemia. Will follow.  2. Hypertension/volume  - slightly overloaded but BP OK-  giving fluids and lasix. Home HCTZ has been held. Stopped her aldactone for now as well.        Rob Malynda Smolinski 08/20/2021, 8:40 PM   Recent Labs  Lab 08/19/21 1800 08/19/21 1816 08/20/21 0023 08/20/21 0538 08/20/21 1155  08/20/21 1922  K  --    < > 3.7   < > 4.0 4.2  BUN  --    < > 35*   < > 36* 37*  CREATININE  --    < > 1.19*   < > 1.18* 1.27*  ALBUMIN 3.0*  --  2.8*  --   --   --   CALCIUM  --   --  14.5*   < > 12.7* 13.2*   < > = values in this interval not displayed.   Inpatient medications:  abemaciclib  100 mg Oral BID   atenolol  50 mg Oral Daily   calcitonin  4 Units/kg Subcutaneous BID   dexamethasone  1 mg Oral BID WC   enoxaparin (LOVENOX) injection  40 mg Subcutaneous Q24H   feeding supplement  1 Container Oral BID BM   feeding supplement  237 mL Oral BID BM   furosemide  40 mg Intravenous BID   lactulose  20 g Oral TID   montelukast  10 mg Oral Daily   potassium chloride SA  40 mEq Oral BID    sodium chloride 200 mL/hr at 08/20/21 0522   acetaminophen **OR** acetaminophen, albuterol, ALPRAZolam, fluticasone furoate-vilanterol, ondansetron **OR** ondansetron (ZOFRAN) IV

## 2021-08-20 NOTE — Assessment & Plan Note (Addendum)
Likely related to liver mets.  LFT trending down.  Ammonia level trending down. 81---57--46 No further labs check, transition to comfort care.

## 2021-08-20 NOTE — Assessment & Plan Note (Addendum)
Likely related to hypovolemia, hypercalcemia.  Prior Cr month ago 0.4. Cr peak to 1.4.  Treated with IV fluids.

## 2021-08-20 NOTE — Progress Notes (Signed)
Once she gets discharged from hospital, she will start Xeloda 100 mg bid 2 weeks on and 1 week off

## 2021-08-20 NOTE — Assessment & Plan Note (Addendum)
Continue with supplement.

## 2021-08-21 ENCOUNTER — Encounter: Payer: Self-pay | Admitting: Radiation Oncology

## 2021-08-21 ENCOUNTER — Other Ambulatory Visit (HOSPITAL_COMMUNITY): Payer: Self-pay

## 2021-08-21 ENCOUNTER — Ambulatory Visit
Admission: RE | Admit: 2021-08-21 | Discharge: 2021-08-21 | Disposition: A | Discharge status: Home / Self Care | Payer: 59 | Source: Ambulatory Visit | Attending: Radiation Oncology | Admitting: Radiation Oncology

## 2021-08-21 LAB — CBC
HCT: 29 % — ABNORMAL LOW (ref 36.0–46.0)
Hemoglobin: 9 g/dL — ABNORMAL LOW (ref 12.0–15.0)
MCH: 29.9 pg (ref 26.0–34.0)
MCHC: 31 g/dL (ref 30.0–36.0)
MCV: 96.3 fL (ref 80.0–100.0)
Platelets: 184 10*3/uL (ref 150–400)
RBC: 3.01 MIL/uL — ABNORMAL LOW (ref 3.87–5.11)
RDW: 21.8 % — ABNORMAL HIGH (ref 11.5–15.5)
WBC: 14.9 10*3/uL — ABNORMAL HIGH (ref 4.0–10.5)
nRBC: 0 % (ref 0.0–0.2)

## 2021-08-21 LAB — BASIC METABOLIC PANEL
Anion gap: 6 (ref 5–15)
BUN: 39 mg/dL — ABNORMAL HIGH (ref 8–23)
CO2: 22 mmol/L (ref 22–32)
Calcium: 12.2 mg/dL — ABNORMAL HIGH (ref 8.9–10.3)
Chloride: 102 mmol/L (ref 98–111)
Creatinine, Ser: 1.28 mg/dL — ABNORMAL HIGH (ref 0.44–1.00)
GFR, Estimated: 47 mL/min — ABNORMAL LOW (ref >60)
Glucose, Bld: 101 mg/dL — ABNORMAL HIGH (ref 70–99)
Potassium: 4.2 mmol/L (ref 3.5–5.1)
Sodium: 130 mmol/L — ABNORMAL LOW (ref 135–145)

## 2021-08-21 LAB — AMMONIA: Ammonia: 62 umol/L — ABNORMAL HIGH (ref 9–35)

## 2021-08-21 MED ORDER — FUROSEMIDE 10 MG/ML IJ SOLN: 60.0000 mg | Freq: Two times a day (BID) | INTRAMUSCULAR | Status: DC

## 2021-08-21 MED ORDER — FUROSEMIDE 10 MG/ML IJ SOLN
40.0000 mg | Freq: Two times a day (BID) | INTRAMUSCULAR | Status: DC
  Administered 2021-08-21 – 2021-08-23 (×5): 40 mg via INTRAVENOUS
  Filled 2021-08-21 (×5): qty 4

## 2021-08-21 NOTE — Progress Notes (Addendum)
Occupational Therapy Evaluation Patient Details Name: Christine Francis MRN: [id number] DOB: Aug 13, 1960 Today's Date: 08/21/2021   History of Present Illness Patient is a 61 year old female who presented to the hosptial with increased confusion and increased calcium levels. patient was found to have hypercalcemia.PMH: breast cancer s/p bilateral mastectomy,mets to lungs and liver,anxiety disorder, hypercalcemia, HTN.   Clinical Impression   Patient is a 61 year old female who was noted to have increased confusion and poor safety awareness. Patient was min guard for all ADL tasks with increased cues for sequencing and safety. Patient was noted to have decreased activity tolerance, decreased endurance, decreased standing balance and poor safety awareness impacting participation in ADLs. Patient will need 24/7 caregiver SUP in next level of care to be successful. Patients family in room were educated on strategies to prevent learned helplessness in next level of care. Patient would continue to benefit from skilled OT services at this time while admitted to address noted deficits in order to improve overall safety and independence in ADLs.       Recommendations for follow up therapy are one component of a multi-disciplinary discharge planning process, led by the attending physician.  Recommendations may be updated based on patient status, additional functional criteria and insurance authorization.   Follow Up Recommendations  No OT follow up    Assistance Recommended at Discharge Frequent or constant Supervision/Assistance  Patient can return home with the following A little help with walking and/or transfers;A little help with bathing/dressing/bathroom;Assistance with cooking/housework;Direct supervision/assist for financial management;Assist for transportation;Direct supervision/assist for medications management;Help with stairs or ramp for entrance    Functional Status Assessment  Patient has  had a recent decline in their functional status and demonstrates the ability to make significant improvements in function in a reasonable and predictable amount of time.  Equipment Recommendations  None recommended by OT    Recommendations for Other Services       Precautions / Restrictions Precautions Precautions: Fall Precaution Comments: poor safety awareness Restrictions Weight Bearing Restrictions: No      Mobility Bed Mobility Overal bed mobility: Needs Assistance Bed Mobility: Supine to Sit     Supine to sit: Min guard, HOB elevated          Transfers                          Balance Overall balance assessment: Mild deficits observed, not formally tested                                         ADL either performed or assessed with clinical judgement   ADL Overall ADL's : Needs assistance/impaired Eating/Feeding: Set up;Sitting;Supervision/ safety   Grooming: Wash/dry face;Wash/dry hands;Standing;Min guard Grooming Details (indicate cue type and reason): with consistent safety cues Upper Body Bathing: Set up;Sitting;Supervision/ safety   Lower Body Bathing: Supervison/ safety;Minimal assistance;Sit to/from stand;Sitting/lateral leans   Upper Body Dressing : Supervision/safety;Set up;Sitting   Lower Body Dressing: Supervision/safety;Sit to/from stand;Sitting/lateral leans;Min guard   Toilet Transfer: Supervision/safety;Minimal assistance;Rolling walker (2 wheels);Ambulation Toilet Transfer Details (indicate cue type and reason): with patient noted to bump walker into items on either side and noted to pick up walker multiple times during tasks. patient was consistently educated to keep RW on floor. Toileting- Water quality scientist and Hygiene: Sit to/from stand;Moderate assistance Toileting - Clothing Manipulation Details (indicate cue type and reason):  patient was mod A for hygiene with patient attempting at first and then needing  assistance for thoughness of task.     Functional mobility during ADLs: Minimal assistance;Rolling walker (2 wheels)       Vision   Vision Assessment?: No apparent visual deficits Additional Comments: patient was noted to have poor awareness of room around her with bumping walker into obejcts when navigating to and from the bathroom.     Perception     Praxis      Pertinent Vitals/Pain Pain Assessment Pain Assessment: No/denies pain     Hand Dominance Right   Extremity/Trunk Assessment Upper Extremity Assessment Upper Extremity Assessment: Overall WFL for tasks assessed   Lower Extremity Assessment Lower Extremity Assessment: Defer to PT evaluation   Cervical / Trunk Assessment Cervical / Trunk Assessment: Normal   Communication Communication Communication: No difficulties   Cognition Arousal/Alertness: Awake/alert Behavior During Therapy: WFL for tasks assessed/performed Overall Cognitive Status: Difficult to assess                                 General Comments: patients mother and niece were in room. patient introduced neice x3 as her "sister in law". patient would verbalize understanding to education provided and continue to complete task with poor safety awareness.     General Comments       Exercises     Shoulder Instructions      Home Living Family/patient expects to be discharged to:: Private residence Living Arrangements: Alone   Type of Home: House Home Access: Stairs to enter [employer] of Steps: 3 Entrance Stairs-Rails: Left Home Layout: Two level;Full bath on main level;Able to live on main level with bedroom/bathroom     Bathroom Shower/Tub: Walk-in shower         Home Equipment: Shower seat - built Medical sales representative (2 wheels)          Prior Functioning/Environment                          OT Problem List: Impaired balance (sitting and/or standing);Decreased activity tolerance;Decreased  safety awareness;Decreased knowledge of precautions;Decreased knowledge of use of DME or AE      OT Treatment/Interventions: Self-care/ADL training;Therapeutic exercise;Neuromuscular education;Energy conservation;DME and/or AE instruction;Therapeutic activities;Balance training;Patient/family education    OT Goals(Current goals can be found in the care plan section) Acute Rehab OT Goals Patient Stated Goal: to go home OT Goal Formulation: With patient Time For Goal Achievement: 09/04/21 Potential to Achieve Goals: Good  OT Frequency: Min 2X/week    Co-evaluation              AM-PAC OT "6 Clicks" Daily Activity     Outcome Measure Help from another person eating meals?: None Help from another person taking care of personal grooming?: A Little Help from another person toileting, which includes using toliet, bedpan, or urinal?: A Little Help from another person bathing (including washing, rinsing, drying)?: A Little Help from another person to put on and taking off regular upper body clothing?: A Little Help from another person to put on and taking off regular lower body clothing?: A Little 6 Click Score: 19   End of Session Equipment Utilized During Treatment: Gait belt;Rolling walker (2 wheels) Nurse Communication: Mobility status  Activity Tolerance: Patient tolerated treatment well Patient left: in chair;with call bell/phone within reach;with chair alarm set;with family/visitor present  OT Visit Diagnosis:  Unsteadiness on feet (R26.81)                Time: 7618-4859 OT Time Calculation (min): 31 min Charges:  OT General Charges $OT Visit: 1 Visit OT Evaluation $OT Eval Low Complexity: 1 Low OT Treatments $Self Care/Home Management : 8-22 mins  Jackelyn Poling OTR/L, MS Acute Rehabilitation Department Office# [phone] Pager# [phone]   Marcellina Millin 08/21/2021, 3:09 PM

## 2021-08-21 NOTE — Telephone Encounter (Signed)
Oral Oncology Patient Advocate Encounter  Prior Authorization for Xeloda has been approved.    PA# [id number] Effective dates: 08/20/21 through 08/20/22  Patient must fill at [facility] will continue to follow.   Christine Francis Patient [place] Phone [phone] Fax [phone] 08/21/2021 7:42 AM

## 2021-08-21 NOTE — Assessment & Plan Note (Addendum)
Related to hypovolemia  resolved

## 2021-08-21 NOTE — Progress Notes (Addendum)
°  Progress Note   Patient: Christine Francis BJS:[id number] DOB: 08/30/1960 DOA: 08/19/2021     2 DOS: the patient was seen and examined on 08/21/2021   Brief hospital course: 61 year old past medical history significant for breast cancer since 2016 with mets to lungs, liver, hypertension.  Treated as an outpatient for hypercalcemia.  Presents with confusion, hypercalcemia calcium level 15, hyperammonemia ammonia 75.  AKI creatinine 1.2 from prior baseline 0.7.  Patient was started on IV fluids, Lasix.  Nephrology was consulted to assist with hypercalcemia. Calcium level trending down. Received a dose of Zometa. Treated with calcitonin.   Assessment and Plan: * Hypercalcemia- (present on admission) Secondary to  metastatic breast CA:  paraneoplastic syndrome. Completed Calcitonin Falcon x 2 days Continue with Laisx and IV fluids. Rate decreased to 150.  Continue to hold  HCTZ Received a dose of Zometa 2/07. Nephrology consulted, and following.   Acute metabolic encephalopathy- (present on admission) Secondary to  hyperammonemia + hypercalcemia. Continue with treatment of hypercalcemia, continue with lactulose. Improving.  PT consultation.   AKI (acute kidney injury) ([place]) Likely related to hypovolemia, hypercalcemia.  Prior Cr month ago 0.4. Cr peak to 1.4.  Continue with IV fluids.   Malignant neoplasm of upper-outer quadrant of left breast in female, estrogen receptor positive ([place]) Metastatic dz. On chemo, DR Lindi Adie following.  Currently causing hypercalcemia and hyperammonemia. Continue Verzenio. Plan for Xeloda out patient.  Receiving radiation therapy.   HTN (hypertension)- (present on admission) Hydrochlorothiazide discontinued due to hypercalcemia. Continue with atenolol. Currently on Lasix for hypercalcemia  Protein-calorie malnutrition, moderate (HCC) Started Boost  Anemia associated with chemotherapy Hb down to baseline at 9/  Prior HB range: 9.3-10.  Abnormal  transaminases Likely related to liver mets.  Follow trend. Repeat labs tomorrow.  Ammonia level trending down.   Hyponatremia Related to hypovolemia  Improved with IV fluids.   Hyperammonemia ([place])- (present on admission) Due to metastatic breast CA with liver involvement. Home lactulose dose recently decreased. Ammonia level at 75 on admission. Ammonia down to 62. Continue with Lactulose increased to 3 times daily.    Code status: DNR discussed with sister.      Subjective: She is alert, feels ok, having multiples BM.   Physical Exam: Vitals:   08/20/21 2151 08/21/21 0518 08/21/21 1000 08/21/21 1316  BP: 122/69 129/77 (!) 109/56 109/65  Pulse: 68 72 66 64  Resp: 14 14 15 16   Temp: 98.3 F (36.8 C) 97.7 F (36.5 C)  (!) 97.5 F (36.4 C)  TempSrc: Oral Oral  Oral  SpO2: 94% 95% 96% 98%  Weight:  79.9 kg    Height:       General: Alert, follows command.  CVS; S 1, S 2 RRR Lungs: CTA Abdomen: soft, nt Neuro; alert follows command  Data Reviewed:  Labs reviewed. CBC, Bmet, ammonia level.   Family Communication: Sister at bedside.   Disposition: Status is: Inpatient Remains inpatient appropriate because: for management of hypercalcemia.           Planned Discharge Destination: Home     Time spent: 45 minutes  Author: Elmarie Shiley, MD 08/21/2021 1:41 PM  For on call review [email or web address].

## 2021-08-21 NOTE — TOC Progression Note (Signed)
Transition of Care [facility]) - Progression Note    Patient Details  Name: Nanako Stopher MRN: [id number] Date of Birth: 1960-12-10  Transition of Care [facility]) CM/SW Contact  Purcell Mouton, RN Phone Number: 08/21/2021, 12:41 PM  Clinical Narrative:      Transition of Care (TOC) Screening Note   Patient Details  Name: Vera Tyiesha Brackney Date of Birth: 21-Sep-1960   Transition of Care [facility]) CM/SW Contact:    Purcell Mouton, RN Phone Number: 08/21/2021, 12:41 PM    Transition of Care Department [facility]) has reviewed patient and no TOC needs have been identified at this time. We will continue to monitor patient advancement through interdisciplinary progression rounds. If new patient transition needs arise, please place a TOC consult.         Expected Discharge Plan and Services                                                 Social Determinants of Health (SDOH) Interventions    Readmission Risk Interventions No flowsheet data found.

## 2021-08-21 NOTE — Telephone Encounter (Signed)
Oral Oncology Pharmacist Encounter  Received new prescription for capecitabine (Xeloda) for the treatment of metastatic triple negative breast cancer, planned duration until disease progression or unacceptable toxicity. Prescription dose and frequency assessed.  Labs from 08/20/21 assessed, no interventions .  Current medication list in Epic reviewed, DDIs with xeloda identified: -omeprazole: will discuss discontinuation as it may decrease efficacy of the medication  Evaluated chart and no patient barriers to medication adherence noted.   Patient agreement for treatment documented in MD note on 08/20/21.  Prescription has been e-scribed to the [facility] for benefits analysis and approval.  Oral Oncology Clinic will continue to follow for insurance authorization, copayment issues, initial counseling and start date.  Drema Halon, PharmD Hematology/Oncology Clinical Pharmacist [facility] [phone] 08/21/2021 9:35 AM

## 2021-08-21 NOTE — Progress Notes (Signed)
Christine Francis  Subjective: pt seen in room, sister at bedside, no c/o's today, denies any SOB, leg swelling.   Vitals:   08/20/21 0446 08/20/21 1452 08/20/21 2151 08/21/21 0518  BP: 139/82 125/65 122/69 129/77  Pulse: 73 72 68 72  Resp: _0 Temp: 97.6 F (36.4 C) (!) 97.4 F (36.3 C) 98.3 F (36.8 C) 97.7 F (36.5 C)  TempSrc: Oral Oral Oral Oral  SpO2: 98% 94% 94% 95%  Weight:    79.9 kg  Height:        Exam: General:  looks younger than stated age-  restless-  some repeating phrases-  mild distress HEENT: PERRLA, EOMI, mucous membranes moist    Neck: no JVD Heart: RRR Lungs: moslty clear Abdomen: soft, non tender Extremities:trace peripheral edema Skin: warm and dry Neuro: mild confusion-  repeating phrases   Assessment/Plan: 61 year old WF with metastatic breast CA-  complication of hypercalcemia in the setting of high PTH related peptide  Assessment/ Plan 1. Hypercalcemia-  due to metastatic breast CA and elevated PTH related peptide.  It has been managed very appropriately in the OP setting but hypercalcemia persists and worsened. Peak Ca++ here was > 15.0 on 2/06.  She has rec'd the 4 doses of calcitonin here. We started NS at 200cc /hr which she appears to be tolerating w/o gross signs of fluid overload. Wt's are up 3kg however, so will decrease NS 0.9% to 150cc/hr and cont IV lasix 40 bid. She has rec'd bisphonate Rx (zometa 78m 2/07) per oncology. Also getting prolia per oncology in OP setting. Also her chemoRx has been adjusted with the addition of Xeloda to her ongoing Verzenio. Ca++ down to 12.2 today. Will follow.  2.Renal- some mild  AKI related to the hypercalcemia, eGFR 45- 50 and creat 1.4 >> 1.2 here.  3. Hypertension/volume  - wt's up a bit but exam okay. BP's okay.     Christine Francis 08/21/2021, 9:19 AM   Recent Labs  Lab 08/19/21 1800 08/19/21 1816 08/20/21 0023 08/20/21 0538 08/20/21 1922 08/21/21 0002  K  --     < > 3.7   < > 4.2 4.2  BUN  --    < > 35*   < > 37* 39*  CREATININE  --    < > 1.19*   < > 1.27* 1.28*  ALBUMIN 3.0*  --  2.8*  --   --   --   CALCIUM  --   --  14.5*   < > 13.2* 12.2*   < > = values in this interval not displayed.    Inpatient medications:  abemaciclib  100 mg Oral BID   atenolol  50 mg Oral Daily   calcitonin  4 Units/kg Subcutaneous BID   dexamethasone  1 mg Oral BID WC   enoxaparin (LOVENOX) injection  40 mg Subcutaneous Q24H   feeding supplement  1 Container Oral BID BM   feeding supplement  237 mL Oral BID BM   furosemide  60 mg Intravenous BID   lactulose  20 g Oral TID   montelukast  10 mg Oral Daily   potassium chloride SA  40 mEq Oral BID    sodium chloride 50 mL/hr at 08/21/21 00254  acetaminophen **OR** acetaminophen, albuterol, ALPRAZolam, fluticasone furoate-vilanterol, ondansetron **OR** ondansetron (ZOFRAN) IV

## 2021-08-22 ENCOUNTER — Other Ambulatory Visit (HOSPITAL_COMMUNITY): Payer: Self-pay

## 2021-08-22 DIAGNOSIS — E872 Acidosis, unspecified: Secondary | ICD-10-CM

## 2021-08-22 DIAGNOSIS — D72829 Elevated white blood cell count, unspecified: Secondary | ICD-10-CM

## 2021-08-22 LAB — RENAL FUNCTION PANEL
Albumin: 2.6 g/dL — ABNORMAL LOW (ref 3.5–5.0)
Anion gap: 10 (ref 5–15)
BUN: 32 mg/dL — ABNORMAL HIGH (ref 8–23)
CO2: 16 mmol/L — ABNORMAL LOW (ref 22–32)
Calcium: 11.5 mg/dL — ABNORMAL HIGH (ref 8.9–10.3)
Chloride: 107 mmol/L (ref 98–111)
Creatinine, Ser: 1.05 mg/dL — ABNORMAL HIGH (ref 0.44–1.00)
GFR, Estimated: >60 mL/min (ref >60)
Glucose, Bld: 112 mg/dL — ABNORMAL HIGH (ref 70–99)
Phosphorus: 2.5 mg/dL (ref 2.5–4.6)
Potassium: 4.4 mmol/L (ref 3.5–5.1)
Sodium: 133 mmol/L — ABNORMAL LOW (ref 135–145)

## 2021-08-22 LAB — CBC
HCT: 31.7 % — ABNORMAL LOW (ref 36.0–46.0)
Hemoglobin: 9.6 g/dL — ABNORMAL LOW (ref 12.0–15.0)
MCH: 30 pg (ref 26.0–34.0)
MCHC: 30.3 g/dL (ref 30.0–36.0)
MCV: 99.1 fL (ref 80.0–100.0)
Platelets: 207 10*3/uL (ref 150–400)
RBC: 3.2 MIL/uL — ABNORMAL LOW (ref 3.87–5.11)
RDW: 22 % — ABNORMAL HIGH (ref 11.5–15.5)
WBC: 15.7 10*3/uL — ABNORMAL HIGH (ref 4.0–10.5)
nRBC: 0 % (ref 0.0–0.2)

## 2021-08-22 LAB — HEPATIC FUNCTION PANEL
ALT: 63 U/L — ABNORMAL HIGH (ref 0–44)
AST: 88 U/L — ABNORMAL HIGH (ref 15–41)
Albumin: 2.5 g/dL — ABNORMAL LOW (ref 3.5–5.0)
Alkaline Phosphatase: 156 U/L — ABNORMAL HIGH (ref 38–126)
Bilirubin, Direct: 0.8 mg/dL — ABNORMAL HIGH (ref 0.0–0.2)
Indirect Bilirubin: 1.3 mg/dL — ABNORMAL HIGH (ref 0.3–0.9)
Total Bilirubin: 2.1 mg/dL — ABNORMAL HIGH (ref 0.3–1.2)
Total Protein: 6.6 g/dL (ref 6.5–8.1)

## 2021-08-22 LAB — AMMONIA: Ammonia: 57 umol/L — ABNORMAL HIGH (ref 9–35)

## 2021-08-22 MED ORDER — CAPECITABINE 500 MG PO TABS: 1000.0000 mg | ORAL_TABLET | Freq: Two times a day (BID) | ORAL | 3 refills | Status: DC

## 2021-08-22 MED ORDER — SODIUM BICARBONATE 650 MG PO TABS
650.0000 mg | ORAL_TABLET | Freq: Three times a day (TID) | ORAL | Status: DC
  Administered 2021-08-22 – 2021-08-23 (×6): 650 mg via ORAL
  Filled 2021-08-22 (×6): qty 1

## 2021-08-22 NOTE — Progress Notes (Signed)
HEMATOLOGY-ONCOLOGY PROGRESS NOTE  SUBJECTIVE: Patient is able to sit up in the chair and walk with the help of a walker.  She is extremely poor appetite and complete lack of taste because of which she is not able to eat much food.  She was able to drink 2 containers of boost.  She is definitely awake and alert and oriented.  Oncology History  Malignant neoplasm of upper-outer quadrant of left breast in female, estrogen receptor positive ([place])  07/19/2014 Initial Biopsy   Rt.Breast Biopsy: LCIS   11/29/2014 Surgery   Right lumpectomy: LCIS with fibrocystic changes with usual ductal hyperplasia,PASH, 0.8 cm margins for LCIS   02/06/2015 -  Anti-estrogen oral therapy   tamoxifen 20 mg daily   12/08/2018 Relapse/Recurrence   Left breast 2 cm mass at 7 o clock position: ILC grade 2, ER 90%, PR 30%, Her 2 neg, Ki 67: 15% T1CN0 stage 1A   02/07/2019 Surgery   Bilateral mastectomies with reconstruction ([place]) Right breast: atypical lobular hyperplasia and no evidence of carcinoma in 1 lymph node. Left breast: invasive lobular carcinoma with LCIS, grade 2, 2.1cm, 2 lymph nodes negative for carcinoma, and invasive carcinoma broadly present at the anterior margin.    02/07/2019 Cancer Staging   Staging form: Breast, AJCC 8th Edition - Pathologic stage from 02/07/2019: Stage IA (pT2, pN0, cM0, G2, ER+, PR+, HER2-, Oncotype DX score: 24) - Signed by Gardenia Phlegm, NP on 03/02/2019    02/07/2019 Oncotype testing   24/10%; no benefit from chemo   02/2019 -  Anti-estrogen oral therapy   Anastrozole daily switched to letrozole 09/26/2019   06/28/2021 Progression   CT parathyroid: 06/28/2021: 9 mm soft tissue nodule possible parathyroid adenoma, 6 mm left lobe of the thyroid nodule PET CT scan 07/05/2021: Diffuse hepatic hypermetabolism concerning for liver metastases versus hepatocellular carcinoma, hypermetabolic left axillary lymph node suspicious for metastatic disease,  heterogeneous bone marrow activity nonspecific, portal vein hypertension   07/19/2021 Initial Biopsy   Liver needle core biopsy: metastatic carcinoma, compatible with breast primary.  ER 95%+, PR negative, Ki-67 15%, HER-2 equivocal by IHC, FISH negative.     Liver metastases ([place])  06/28/2021 Progression   CT parathyroid: 06/28/2021: 9 mm soft tissue nodule possible parathyroid adenoma, 6 mm left lobe of the thyroid nodule PET CT scan 07/05/2021: Diffuse hepatic hypermetabolism concerning for liver metastases versus hepatocellular carcinoma, hypermetabolic left axillary lymph node suspicious for metastatic disease, heterogeneous bone marrow activity nonspecific, portal vein hypertension   07/19/2021 Initial Biopsy   Liver needle core biopsy: metastatic carcinoma, compatible with breast primary.  ER 95%+, PR negative, Ki-67 15%, HER-2 equivocal by IHC, FISH negative.     07/31/2021 Initial Diagnosis   Liver metastases ([place])   08/05/2021 Cancer Staging   Staging form: Liver, AJCC 8th Edition - Pathologic: Stage IVB (pM1) - Signed by Gardenia Phlegm, NP on 08/05/2021      OBJECTIVE: REVIEW OF SYSTEMS:   Constitutional: Slight jaundice Neurological: Generalized weakness and tremors Behavioral/Psych: Mood is stable, no new changes  Extremities: No lower extremity edema   PHYSICAL EXAMINATION: ECOG PERFORMANCE STATUS: 2 - Symptomatic, <50% confined to bed  Vitals:   08/22/21 0507 08/22/21 1336  BP: 136/73 139/67  Pulse: 77 92  Resp: 14 15  Temp: (!) 97.5 F (36.4 C) 98.4 F (36.9 C)  SpO2: 95% 98%   Filed Weights   08/21/21 0518 08/22/21 0500 08/22/21 0507  Weight: 176 lb 2.4 oz (79.9 kg) 174 lb 9.7  oz (79.2 kg) 177 lb 11.1 oz (80.6 kg)    LABORATORY DATA:  I have reviewed the data as listed CMP Latest Ref Rng & Units 08/22/2021 08/21/2021 08/20/2021  Glucose 70 - 99 mg/dL 112(H) 101(H) 133(H)  BUN 8 - 23 mg/dL 32(H) 39(H) 37(H)  Creatinine 0.44 - 1.00 mg/dL 1.05(H)  1.28(H) 1.27(H)  Sodium 135 - 145 mmol/L 133(L) 130(L) 130(L)  Potassium 3.5 - 5.1 mmol/L 4.4 4.2 4.2  Chloride 98 - 111 mmol/L 107 102 102  CO2 22 - 32 mmol/L 16(L) 22 20(L)  Calcium 8.9 - 10.3 mg/dL 11.5(H) 12.2(H) 13.2(HH)  Total Protein 6.5 - 8.1 g/dL 6.6 - -  Total Bilirubin 0.3 - 1.2 mg/dL 2.1(H) - -  Alkaline Phos 38 - 126 U/L 156(H) - -  AST 15 - 41 U/L 88(H) - -  ALT 0 - 44 U/L 63(H) - -    Lab Results  Component Value Date   WBC 15.7 (H) 08/22/2021   HGB 9.6 (L) 08/22/2021   HCT 31.7 (L) 08/22/2021   MCV 99.1 08/22/2021   PLT 207 08/22/2021   NEUTROABS 12.0 (H) 08/14/2021    ASSESSMENT AND PLAN: 1.  Metastatic breast cancer with liver metastases: Currently on Verzenio and letrozole. We ordered change in treatment to Xeloda. It is likely that she will receive the medication in the mail next Thursday. They are trying to expedite this. Until then she will continue Verzenio.  2. severe hypercalcemia due to malignancy causing mental status changes: With great difficulty the calcium level has come down.  3.  Elevated ammonia level: On lactulose

## 2021-08-22 NOTE — Assessment & Plan Note (Addendum)
Treated  with IV fluids.  Started  Bicarb tablet.  Improved.

## 2021-08-22 NOTE — Progress Notes (Signed)
[facility] Progress Note  Subjective: pt seen in room, more alert and interactive today  Vitals:   08/21/21 1316 08/21/21 2126 08/22/21 0500 08/22/21 0507  BP: 109/65 136/73  136/73  Pulse: 64 68  77  Resp: _0 Temp: (!) 97.5 F (36.4 C) 97.6 F (36.4 C)  (!) 97.5 F (36.4 C)  TempSrc: Oral Oral  Oral  SpO2: 98% 97%  95%  Weight:   79.2 kg 80.6 kg  Height:        Exam: General: no distress, Ox 4 today Neck: no JVD Heart: RRR Lungs: mostly clear Abdomen: soft, non tender Extremities:trace peripheral edema Skin: warm and dry Neuro: better   Assessment/Plan: 61 year old WF with metastatic breast CA-  complication of hypercalcemia in the setting of high PTH related peptide  Assessment/ Plan 1. Hypercalcemia-  due to metastatic breast CA and elevated PTH related peptide.  It has been managed very appropriately in the OP setting but hypercalcemia worsened. Peak Ca++ on admit 2/06 was > 15.0.  4 doses of calcitonin were given. NS at 200cc /hr was given, then dec'd to 150 cc/hr w/ IV lasix 40 bid. Wt's are stable and exam is stable. She has rec'd bisphonate Rx (zometa 20m 2/07) per oncology. Also getting prolia per oncology in OP setting. Also her chemoRx has been adjusted with the addition of Xeloda. Ca++ down to 12.2 yest and 11.5 today. Will continue current regimen.  2.Renal- some mild  AKI related to the hypercalcemia, eGFR 45- 50 and creat 1.4 >> 1.2 > 1.0 today.  3. Hypertension/volume  - wt's up but stable, exam okay. BP's good.     Christine Francis 08/22/2021, 12:54 PM   Recent Labs  Lab 08/21/21 0002 08/22/21 0458 08/22/21 0755  K 4.2  --  4.4  BUN 39*  --  32*  CREATININE 1.28*  --  1.05*  ALBUMIN  --  2.5* 2.6*  CALCIUM 12.2*  --  11.5*  PHOS  --   --  2.5    Inpatient medications:  abemaciclib  100 mg Oral BID   atenolol  50 mg Oral Daily   dexamethasone  1 mg Oral BID WC   enoxaparin (LOVENOX) injection  40 mg Subcutaneous Q24H    feeding supplement  1 Container Oral BID BM   feeding supplement  237 mL Oral BID BM   furosemide  40 mg Intravenous BID   lactulose  20 g Oral TID   montelukast  10 mg Oral Daily   potassium chloride SA  40 mEq Oral BID   sodium bicarbonate  650 mg Oral TID    sodium chloride 150 mL/hr at 08/22/21 1027   acetaminophen **OR** acetaminophen, albuterol, ALPRAZolam, fluticasone furoate-vilanterol, ondansetron **OR** ondansetron (ZOFRAN) IV

## 2021-08-22 NOTE — Assessment & Plan Note (Signed)
Chronic leukocytosis, WBC at 19 3 weeks ago. Likely related to steroids.

## 2021-08-22 NOTE — Progress Notes (Signed)
°  Progress Note   Patient: Christine Francis ZDG:[id number] DOB: 03-Apr-1961 DOA: 08/19/2021     3 DOS: the patient was seen and examined on 08/22/2021   Brief hospital course: 61 year old past medical history significant for breast cancer since 2016 with mets to lungs, liver, hypertension.  Treated as an outpatient for hypercalcemia.  Presents with confusion, hypercalcemia calcium level 15, hyperammonemia ammonia 75.  AKI creatinine 1.2 from prior baseline 0.7.  Patient was started on IV fluids, Lasix.  Nephrology was consulted to assist with hypercalcemia. Calcium level trending down. Received a dose of Zometa. Treated with calcitonin.   Assessment and Plan: * Hypercalcemia- (present on admission) Secondary to  metastatic breast CA:  paraneoplastic syndrome. Completed Calcitonin [place] x 2 days Continue to hold  HCTZ Received a dose of Zometa 2/07. Nephrology consulted, and following.  Plan to continue with IV fluids.   Acute metabolic encephalopathy- (present on admission) Secondary to hyperammonemia + hypercalcemia. Continue with treatment of hypercalcemia, continue with lactulose. Improving.  Evaluated by PT, no HH pt recommend.   AKI (acute kidney injury) ([place]) Likely related to hypovolemia, hypercalcemia.  Prior Cr month ago 0.4. Cr peak to 1.4.  Continue with IV fluids. Cr stable.   Malignant neoplasm of upper-outer quadrant of left breast in female, estrogen receptor positive ([place]) Metastatic dz. On chemo, DR Lindi Adie following.  Currently causing hypercalcemia and hyperammonemia. Continue Verzenio. Plan for Xeloda out patient.  Receiving radiation therapy.   HTN (hypertension)- (present on admission) Hydrochlorothiazide discontinued due to hypercalcemia. Continue with atenolol. Currently on Lasix for hypercalcemia  Leukocytosis Chronic leukocytosis, WBC at 19 3 weeks ago. Likely related to steroids.   Metabolic acidosis Continue with IV fluids.  Start Bicarb tablet.    Protein-calorie malnutrition, moderate ([place]) Continue with supplement.   Anemia associated with chemotherapy Hb down to baseline at 9/  Prior HB range: 9.3-10.  Abnormal transaminases Likely related to liver mets.  LFT trending down.  Ammonia level trending down. 81---57  Hyponatremia Related to hypovolemia  Improved with IV fluids.   Hyperammonemia ([place])- (present on admission) Due to metastatic breast CA with liver involvement. Home lactulose dose recently decreased. Ammonia level at 75 on admission. Ammonia down to 57 Continue with Lactulose increased to 3 times daily.  Alert, interactie        Subjective: Patient is alert, oriented times 3. Denies pain. Had some tremors yesterday, no significant tremors today.   Physical Exam: Vitals:   08/21/21 2126 08/22/21 0500 08/22/21 0507 08/22/21 1336  BP: 136/73  136/73 139/67  Pulse: 68  77 92  Resp: 14  14 15   Temp: 97.6 F (36.4 C)  (!) 97.5 F (36.4 C) 98.4 F (36.9 C)  TempSrc: Oral  Oral Oral  SpO2: 97%  95% 98%  Weight:  79.2 kg 80.6 kg   Height:       General; NAD Lung; CTA Abdomen; soft , nt Neuro, alert oriented times 3  Data Reviewed:  Bmet , LFT and ammonia level reviewed.   Family Communication: Sister who was at bedside.   Disposition: Status is: Inpatient Remains inpatient appropriate because: Management of hypercalcemia.           Planned Discharge Destination: Home     Time spent: 45 minutes  Author: Elmarie Shiley, MD 08/22/2021 2:54 PM  For on call review [email or web address].

## 2021-08-22 NOTE — Evaluation (Signed)
Physical Therapy Evaluation Patient Details Name: Christine Francis MRN: [id number] DOB: 28-Jan-1961 Today's Date: 08/22/2021  History of Present Illness  Patient is a 61 year old female who presented to the hosptial with increased confusion and increased calcium levels. patient was found to have hypercalcemia.PMH: breast cancer s/p bilateral mastectomy,mets to lungs and liver,anxiety disorder, hypercalcemia, HTN.   Clinical Impression  Pt is a 61 y.o. female with above HPI resulting in the deficits listed below (see PT Problem List). Pt performed sit to stand transfers with MIN guard for safety and cues for safe hand placement. Pt ambulated total of ~132ft with MIN guard progressing to supervision for safety and use of RW. Pt's sister has been living with her and providing assist with ADLs and mobility past few months. Pt will have assist from her sister upon d/c. Pt will benefit from skilled PT during hospital stay to maximize functional mobility, increase independence, and for stair training to maximize safety as pt has multiple steps to enter home. Anticipate no skilled PT needs upon d/c.         Recommendations for follow up therapy are one component of a multi-disciplinary discharge planning process, led by the attending physician.  Recommendations may be updated based on patient status, additional functional criteria and insurance authorization.  Follow Up Recommendations No PT follow up    Assistance Recommended at Discharge Intermittent Supervision/Assistance  Patient can return home with the following  A little help with walking and/or transfers;A little help with bathing/dressing/bathroom;Assistance with cooking/housework;Assistance with feeding;Assist for transportation;Help with stairs or ramp for entrance;Direct supervision/assist for financial management;Direct supervision/assist for medications management    Equipment Recommendations None recommended by PT  Recommendations for  Other Services       Functional Status Assessment Patient has had a recent decline in their functional status and demonstrates the ability to make significant improvements in function in a reasonable and predictable amount of time.     Precautions / Restrictions Precautions Precautions: Fall Restrictions Weight Bearing Restrictions: No      Mobility  Bed Mobility Overal bed mobility: Needs Assistance Bed Mobility: Supine to Sit     Supine to sit: Supervision, HOB elevated     General bed mobility comments: supervision for safety    Transfers Overall transfer level: Needs assistance Equipment used: Rolling walker (2 wheels) Transfers: Sit to/from Stand, Bed to chair/wheelchair/BSC Sit to Stand: Supervision           General transfer comment: x1 from EOB, X1 from [facility] cues for safe hand placement. close supervision for safety.    Ambulation/Gait Ambulation/Gait assistance: Min guard, Supervision Gait Distance (Feet): 160 Feet Assistive device: Rolling walker (2 wheels) Gait Pattern/deviations: Step-through pattern, Decreased stride length Gait velocity: WNL     General Gait Details: no overt LOB observed, intermittent cuing for obstacle negotiation with use of RW. Reported fatigue following ambulation.  Stairs            Wheelchair Mobility    Modified Rankin (Stroke Patients Only)       Balance Overall balance assessment: Mild deficits observed, not formally tested                                           Pertinent Vitals/Pain Pain Assessment Pain Assessment: No/denies pain    Home Living Family/patient expects to be discharged to:: Private residence Living Arrangements: Other relatives (  sister) Available Help at Discharge: Family (sister is staying with pt past few months) Type of Home: House Home Access: Stairs to enter Entrance Stairs-Rails: Left Entrance Stairs-Number of Steps: 3   Home Layout: Two level;Full bath on  main level;Able to live on main level with bedroom/bathroom Home Equipment: Shower seat - built Medical sales representative (2 wheels);BSC/3in1 (borrowing BSC from friend) Additional Comments: sister present throughout session and providing some PLOF information as well.    Prior Function Prior Level of Function : Needs assist       Physical Assist : ADLs (physical);Mobility (physical) Mobility (physical): Stairs ADLs (physical): Bathing;Dressing;Toileting;Feeding Mobility Comments: supervision for safety with mobility. x2 falls at home       Hand Dominance   Dominant Hand: Right    Extremity/Trunk Assessment   Upper Extremity Assessment Upper Extremity Assessment: Defer to OT evaluation    Lower Extremity Assessment Lower Extremity Assessment: Generalized weakness    Cervical / Trunk Assessment Cervical / Trunk Assessment: Normal  Communication   Communication: No difficulties  Cognition Arousal/Alertness: Awake/alert Behavior During Therapy: WFL for tasks assessed/performed Overall Cognitive Status: Within Functional Limits for tasks assessed                                          General Comments      Exercises     Assessment/Plan    PT Assessment Patient needs continued PT services  PT Problem List Decreased strength;Decreased activity tolerance;Decreased balance;Decreased mobility;Decreased knowledge of use of DME       PT Treatment Interventions DME instruction;Gait training;Stair training;Functional mobility training;Therapeutic activities;Therapeutic exercise;Balance training;Patient/family education    PT Goals (Current goals can be found in the Care Plan section)  Acute Rehab PT Goals Patient Stated Goal: Keep moving, want to stay strong and maintain functional mobility PT Goal Formulation: With patient/family Time For Goal Achievement: 09/05/21 Potential to Achieve Goals: Fair    Frequency Min 3X/week     Co-evaluation                AM-PAC PT "6 Clicks" Mobility  Outcome Measure Help needed turning from your back to your side while in a flat bed without using bedrails?: None Help needed moving from lying on your back to sitting on the side of a flat bed without using bedrails?: A Little Help needed moving to and from a bed to a chair (including a wheelchair)?: A Little Help needed standing up from a chair using your arms (e.g., wheelchair or bedside chair)?: A Little Help needed to walk in hospital room?: A Little Help needed climbing 3-5 steps with a railing? : A Lot 6 Click Score: 18    End of Session Equipment Utilized During Treatment: Gait belt Activity Tolerance: Patient tolerated treatment well Patient left: in chair;with call bell/phone within reach;with family/visitor present Nurse Communication: Mobility status PT Visit Diagnosis: Unsteadiness on feet (R26.81);Muscle weakness (generalized) (M62.81)    Time: 9678-9381 PT Time Calculation (min) (ACUTE ONLY): 27 min   Charges:   PT Evaluation $PT Eval Low Complexity: 1 Low PT Treatments $Therapeutic Activity: 8-22 mins       Festus Barren PT, DPT  Acute Rehabilitation Services  Office [phone]  08/22/2021, 5:43 PM

## 2021-08-23 ENCOUNTER — Telehealth: Payer: Self-pay | Admitting: *Deleted

## 2021-08-23 DIAGNOSIS — K921 Melena: Secondary | ICD-10-CM

## 2021-08-23 DIAGNOSIS — D6481 Anemia due to antineoplastic chemotherapy: Secondary | ICD-10-CM

## 2021-08-23 DIAGNOSIS — R748 Abnormal levels of other serum enzymes: Secondary | ICD-10-CM

## 2021-08-23 LAB — CBC
HCT: 31.8 % — ABNORMAL LOW (ref 36.0–46.0)
Hemoglobin: 9.9 g/dL — ABNORMAL LOW (ref 12.0–15.0)
MCH: 30.4 pg (ref 26.0–34.0)
MCHC: 31.1 g/dL (ref 30.0–36.0)
MCV: 97.5 fL (ref 80.0–100.0)
Platelets: 181 10*3/uL (ref 150–400)
RBC: 3.26 MIL/uL — ABNORMAL LOW (ref 3.87–5.11)
RDW: 21.8 % — ABNORMAL HIGH (ref 11.5–15.5)
WBC: 12.9 10*3/uL — ABNORMAL HIGH (ref 4.0–10.5)
nRBC: 0 % (ref 0.0–0.2)

## 2021-08-23 LAB — RENAL FUNCTION PANEL
Albumin: 2.6 g/dL — ABNORMAL LOW (ref 3.5–5.0)
Anion gap: 6 (ref 5–15)
BUN: 27 mg/dL — ABNORMAL HIGH (ref 8–23)
CO2: 19 mmol/L — ABNORMAL LOW (ref 22–32)
Calcium: 12.7 mg/dL — ABNORMAL HIGH (ref 8.9–10.3)
Chloride: 109 mmol/L (ref 98–111)
Creatinine, Ser: 0.82 mg/dL (ref 0.44–1.00)
GFR, Estimated: >60 mL/min (ref >60)
Glucose, Bld: 122 mg/dL — ABNORMAL HIGH (ref 70–99)
Phosphorus: 2.2 mg/dL — ABNORMAL LOW (ref 2.5–4.6)
Potassium: 4.4 mmol/L (ref 3.5–5.1)
Sodium: 134 mmol/L — ABNORMAL LOW (ref 135–145)

## 2021-08-23 LAB — HEMOGLOBIN AND HEMATOCRIT, BLOOD
HCT: 31.3 % — ABNORMAL LOW (ref 36.0–46.0)
Hemoglobin: 9.5 g/dL — ABNORMAL LOW (ref 12.0–15.0)

## 2021-08-23 MED ORDER — RIFAXIMIN 550 MG PO TABS
550.0000 mg | ORAL_TABLET | Freq: Two times a day (BID) | ORAL | Status: DC
  Administered 2021-08-23 (×2): 550 mg via ORAL
  Filled 2021-08-23 (×3): qty 1

## 2021-08-23 MED ORDER — CAPECITABINE 500 MG PO TABS
1000.0000 mg | ORAL_TABLET | Freq: Two times a day (BID) | ORAL | Status: DC
  Administered 2021-08-23: 1000 mg via ORAL

## 2021-08-23 MED ORDER — CINACALCET HCL 30 MG PO TABS
60.0000 mg | ORAL_TABLET | Freq: Once | ORAL | Status: AC
  Administered 2021-08-23: 60 mg via ORAL
  Filled 2021-08-23: qty 2

## 2021-08-23 MED ORDER — PANTOPRAZOLE SODIUM 40 MG IV SOLR
40.0000 mg | Freq: Two times a day (BID) | INTRAVENOUS | Status: DC
  Administered 2021-08-23 (×2): 40 mg via INTRAVENOUS
  Filled 2021-08-23 (×2): qty 10

## 2021-08-23 MED ORDER — CINACALCET HCL 30 MG PO TABS: 30.0000 mg | ORAL_TABLET | Freq: Two times a day (BID) | ORAL | Status: DC

## 2021-08-23 MED ORDER — LACTULOSE 10 GM/15ML PO SOLN
20.0000 g | Freq: Two times a day (BID) | ORAL | Status: DC
  Administered 2021-08-23: 20 g via ORAL
  Filled 2021-08-23: qty 30

## 2021-08-23 MED ORDER — ZINC OXIDE 40 % EX OINT
TOPICAL_OINTMENT | Freq: Three times a day (TID) | CUTANEOUS | Status: DC
  Administered 2021-08-23 (×2): 1 via TOPICAL
  Filled 2021-08-23: qty 57

## 2021-08-23 MED ORDER — HYDROCORTISONE ACETATE 25 MG RE SUPP
25.0000 mg | Freq: Every day | RECTAL | Status: DC
  Administered 2021-08-23: 25 mg via RECTAL
  Filled 2021-08-23 (×2): qty 1

## 2021-08-23 MED ORDER — CINACALCET HCL 30 MG PO TABS
30.0000 mg | ORAL_TABLET | Freq: Two times a day (BID) | ORAL | Status: DC
  Administered 2021-08-23: 30 mg via ORAL
  Filled 2021-08-23 (×2): qty 1

## 2021-08-23 MED ORDER — TRAMADOL HCL 50 MG PO TABS
50.0000 mg | ORAL_TABLET | Freq: Once | ORAL | Status: AC
  Administered 2021-08-23: 50 mg via ORAL
  Filled 2021-08-23: qty 1

## 2021-08-23 NOTE — Consult Note (Addendum)
Referring Provider: Dr. Niel Hummer  Primary Care Physician:  Molli Posey, MD Primary Gastroenterologist:  Dr. Silvano Rusk   Reason for Consultation:  Hematochezia   HPI: Christine Francis is a 61 y.o. female with a past medical history of anxiety, asthma, hypertension, hyperlipidemia, breast cancer s/p right breast lumpectomy, recurrence of breast cancer s/p bilateral mastectomies with metastases to the lungs and liver on chemo (Fulvestrant and Abemaciclib) and ration therapy, s/p parathyroidectomy, hypercalcemia and colon polyps.   She was admitted to the hospital 08/19/2021 due to altered mental status and hypercalcemia. Admission labs showed a calcium level of 15. Cr 1.24. WBC 13.5. 10.9 - Hg 11.6 (Hg 10.3 on 08/14/2021). She was started on IV fluids, Lasix, Zometa and calcitonin per nephrology. Her hepatic encephalopathy and renal status have improved over the past 24 hours.  A GI consult was requested due to the development of  rectal bleeding this morning. The patient, her sister and the nursing staff reported she passed numerous brown loose stools throughout the night and at least 4 similar BM s this morning. She passed a small amount of bright red blood with the last 2 bowel movements. She is on Lactulose tid for encephalopathy with hyperammonemia. No associated abdominal or rectal pain.  She has mild soreness around the anal area from wiping or frequent bowel movements.  She denies NSAID use.  She is not taking any anticoagulants.  She has a history of numerous colon polyps followed by Dr. Carlean Purl.  Her most recent colonoscopy was 06/03/2021 and 12 tubular adenomatous polyps were removed from the colon.  She was referred to the genetic counselor at the cancer center, suspect polyposis syndrome.  Repeat colonoscopy in 1 year was recommended.  PAST GI PROCEDURES:  Colonoscopy 06/03/2021: -Twelve (12) diminutive tubular adenomatous polyps removed from the the descending colon, in  the transverse colon, in the ascending colon and in the cecum, removed with a cold snare. Resected and retrieved. - The examination was otherwise normal on direct and retroflexion views. -Repeat colonoscopy in 1 year -Referred to genetics counselor at cancer center re: numerous colon adenomas, suspect polyposis syndrome  Colonoscopy 11/24/2017: - Six small (max 6 mm) polyps in the rectum, in the descending colon, in the transverse colon and in the ascending colon, removed with a cold snare. Resected and retrieved. - The examination was otherwise normal on direct and retroflexion views. - Personal history of colonic polyps. adenomas 2007 and 2012  Colonoscopy 11/29/2012: 3 diminutive polyps removed from the ascending and transverse colon  Colonoscopy 11/17/2005: Multiple polyps removed from the transverse colon, sigmoid colon and rectum   Past Medical History:  Diagnosis Date   Allergy    allergy shots weekly   Anxiety    no meds currently   Asthma    Breast cancer ([place])    left - LCIS - bilat mastec   Cancer ([place])    skin- basil cancer face, chest and left leg--   GERD (gastroesophageal reflux disease)    Hypercalcemia    Hyperlipidemia    diet controlled, no meds   Hypertension    IBS (irritable bowel syndrome)    no problems currently   Personal history of colonic adenomas 11/17/2005   11/17/2005 - 3 adenomas - one  A TV adenoma was 12 mm (max)    Past Surgical History:  Procedure Laterality Date   BIOPSY BREAST  2000   right; benign   BREAST BIOPSY  2012   right breast; pre cancerous  BREAST LUMPECTOMY Right 04/1999, 11/2009   BREAST LUMPECTOMY WITH RADIOACTIVE SEED LOCALIZATION Right 11/29/2014   Procedure: BREAST LUMPECTOMY WITH RADIOACTIVE SEED LOCALIZATION;  Surgeon: Donnie Mesa, MD;  Location: [facility];  Service: General;  Laterality: Right;   BREAST RECONSTRUCTION WITH PLACEMENT OF TISSUE EXPANDER AND ALLODERM Bilateral 02/07/2019    Procedure: BILATERAL BREAST RECONSTRUCTION WITH PLACEMENT OF TISSUE EXPANDER AND ALLODERM;  Surgeon: [name], MD;  Location: [facility];  Service: Plastics;  Laterality: Bilateral;   BREAST RECONSTRUCTION WITH PLACEMENT OF TISSUE EXPANDER AND ALLODERM Left 05/24/2019   Procedure: BREAST RECONSTRUCTION WITH PLACEMENT OF TISSUE EXPANDER AND ALLODERM;  Surgeon: [name], MD;  Location: [facility];  Service: Plastics;  Laterality: Left;   BUNIONECTOMY  1986   bilateral   cervical cryosurgery  12/2013   COLONOSCOPY     x 2   DIAGNOSTIC LAPAROSCOPY     IR ANGIOGRAM SELECTIVE EACH ADDITIONAL VESSEL  07/22/2021   IR ANGIOGRAM SELECTIVE EACH ADDITIONAL VESSEL  07/22/2021   IR ANGIOGRAM SELECTIVE EACH ADDITIONAL VESSEL  07/22/2021   IR ANGIOGRAM VISCERAL SELECTIVE  07/22/2021   IR EMBO ART  VEN HEMORR LYMPH EXTRAV  INC GUIDE ROADMAPPING  07/22/2021   IR US GUIDE VASC ACCESS RIGHT  07/22/2021   laproscopy  1998   LIPOSUCTION Bilateral 01/31/2020   Procedure: LIPOSUCTION BILATERAL LATERAL CHEST WALL;  Surgeon: [name], MD;  Location: [facility];  Service: Plastics;  Laterality: Bilateral;   MASTECTOMY W/ SENTINEL NODE BIOPSY Bilateral 02/07/2019   Procedure: BILATERAL MASTECTOMIES WITH LEFT SENTINEL LYMPH NODE BIOPSY;  Surgeon: Donnie Mesa, MD;  Location: [place];  Service: General;  Laterality: Bilateral;  PEC BLOCK   PARATHYROIDECTOMY  2005   POLYPECTOMY     RE-EXCISION OF BREAST LUMPECTOMY Left 02/24/2019   Procedure: RE-EXCISION LEFT MASTECTOMY- LEFT DERMAL FLAP;  Surgeon: Donnie Mesa, MD;  Location: [place];  Service: General;  Laterality: Left;   REMOVAL OF BILATERAL TISSUE EXPANDERS WITH PLACEMENT OF BILATERAL BREAST IMPLANTS Bilateral 01/31/2020   Procedure: REMOVAL OF BILATERAL TISSUE EXPANDERS WITH PLACEMENT OF BILATERAL SILICONE BREAST IMPLANTS;  Surgeon: [name], MD;  Location: [place];  Service:  Plastics;  Laterality: Bilateral;   REMOVAL OF TISSUE EXPANDER AND PLACEMENT OF IMPLANT Left 02/24/2019   Procedure: REMOVAL OF LEFT CHEST TISSUE EXPANDER WITH ALLODERM;  Surgeon: [name], MD;  Location: [place];  Service: Plastics;  Laterality: Left;   TISSUE EXPANDER FILLING Right 02/24/2019   Procedure: RIGHT CHEST TISSUE EXPANDER FILLING;  Surgeon: [name], MD;  Location: [place];  Service: Plastics;  Laterality: Right;  (474m 0.9% Normal Saline)   uterine bx     thickening of uterus was the reason for havin bx   WISDOM TOOTH EXTRACTION      Prior to Admission medications   Medication Sig Start Date End Date Taking? Authorizing Provider  abemaciclib (VERZENIO) 100 MG tablet Take 1 tablet (100 mg total) by mouth 2 (two) times daily. Swallow tablets whole. Do not chew, crush, or split tablets before swallowing. 08/16/21  Yes GNicholas Lose MD  albuterol (PROVENTIL HFA;VENTOLIN HFA) 108 (90 BASE) MCG/ACT inhaler Inhale 1-2 puffs into the lungs every 6 (six) hours as needed for wheezing or shortness of breath.    Yes [provider]  ALPRAZolam (Duanne Moron 0.5 MG tablet Take 0.5 mg by mouth daily as needed for anxiety.   Yes [provider]  atenolol (TENORMIN) 50 MG  tablet Take 1 tablet (50 mg total) by mouth daily. 07/28/21  Yes Little Ishikawa, MD  dexamethasone (DECADRON) 1 MG tablet Take 1 tablet (1 mg total) by mouth 2 (two) times daily with a meal. 08/09/21  Yes Nicholas Lose, MD  EPINEPHrine 0.3 mg/0.3 mL IJ SOAJ injection Inject 0.3 mg into the muscle once as needed for anaphylaxis.   Yes [provider]  fluticasone furoate-vilanterol (BREO ELLIPTA) 200-25 MCG/INH AEPB Inhale 1 puff into the lungs daily as needed (shortness of breath).   Yes [provider]  furosemide (LASIX) 40 MG tablet Take 1 tablet (40 mg total) by mouth 2 (two) times daily. 07/31/21  Yes Causey, Charlestine Massed, NP  lactulose  (CHRONULAC) 10 GM/15ML solution TAKE 30 MLS (20 G TOTAL) BY MOUTH 3 (THREE) TIMES DAILY. 08/19/21  Yes Nicholas Lose, MD  montelukast (SINGULAIR) 10 MG tablet Take 10 mg by mouth daily.    Yes [provider]  omeprazole (PRILOSEC OTC) 20 MG tablet Take 20 mg by mouth daily.   Yes [provider]  Phenylephrine-DM-GG-APAP (MUCINEX SINUS-MAX) 5-10-200-325 MG TABS Take 2 tablets by mouth daily as needed (congestion).   Yes [provider]  potassium chloride SA (KLOR-CON M) 20 MEQ tablet Take 2 tablets (40 mEq total) by mouth 2 (two) times daily. 07/31/21  Yes Causey, Charlestine Massed, NP  spironolactone (ALDACTONE) 25 MG tablet Take 1 tablet (25 mg total) by mouth daily. Patient taking differently: Take 25 mg by mouth every evening. 07/31/21  Yes Causey, Charlestine Massed, NP  capecitabine (XELODA) 500 MG tablet Take 2 tablets (1,000 mg total) by mouth 2 (two) times daily after a meal. 14 days on and 7 days off 08/22/21   Nicholas Lose, MD    Current Facility-Administered Medications  Medication Dose Route Frequency Provider Last Rate Last Admin   0.9 %  sodium chloride infusion   Intravenous Continuous Roney Jaffe, MD 150 mL/hr at 08/23/21 0729 New Bag at 08/23/21 0729   abemaciclib (VERZENIO) tablet 100 mg  100 mg Oral BID Etta Quill, DO   100 mg at 08/22/21 2200   acetaminophen (TYLENOL) tablet 650 mg  650 mg Oral Q6H PRN Etta Quill, DO       Or   acetaminophen (TYLENOL) suppository 650 mg  650 mg Rectal Q6H PRN Etta Quill, DO       albuterol (PROVENTIL) (2.5 MG/3ML) 0.083% nebulizer solution 2.5 mg  2.5 mg Inhalation Q6H PRN Etta Quill, DO       ALPRAZolam Duanne Moron) tablet 0.5 mg  0.5 mg Oral Daily PRN Etta Quill, DO       atenolol (TENORMIN) tablet 50 mg  50 mg Oral Daily Jennette Kettle M, DO   50 mg at 08/23/21 1018   cinacalcet (SENSIPAR) tablet 30 mg  30 mg Oral BID WC Roney Jaffe, MD       dexamethasone (DECADRON) tablet 1 mg  1 mg  Oral BID WC Jennette Kettle M, DO   1 mg at 08/23/21 0820   feeding supplement (BOOST / RESOURCE BREEZE) liquid 1 Container  1 Container Oral BID BM Regalado, Belkys A, MD   1 Container at 08/22/21 2157   feeding supplement (ENSURE ENLIVE / ENSURE PLUS) liquid 237 mL  237 mL Oral BID BM Etta Quill, DO   237 mL at 08/23/21 1019   fluticasone furoate-vilanterol (BREO ELLIPTA) 200-25 MCG/ACT 1 puff  1 puff Inhalation Daily PRN Etta Quill, DO  furosemide (LASIX) injection 40 mg  40 mg Intravenous BID Roney Jaffe, MD   40 mg at 08/23/21 0819   lactulose (CHRONULAC) 10 GM/15ML solution 20 g  20 g Oral BID Regalado, Belkys A, MD       montelukast (SINGULAIR) tablet 10 mg  10 mg Oral Daily Jennette Kettle M, DO   10 mg at 08/23/21 1018   ondansetron (ZOFRAN) tablet 4 mg  4 mg Oral Q6H PRN Etta Quill, DO       Or   ondansetron [facility]) injection 4 mg  4 mg Intravenous Q6H PRN Etta Quill, DO   4 mg at 08/21/21 8016   pantoprazole (PROTONIX) injection 40 mg  40 mg Intravenous Q12H Regalado, Belkys A, MD       potassium chloride SA (KLOR-CON M) CR tablet 40 mEq  40 mEq Oral BID Jennette Kettle M, DO   40 mEq at 08/23/21 1018   rifaximin (XIFAXAN) tablet 550 mg  550 mg Oral BID Regalado, Belkys A, MD       sodium bicarbonate tablet 650 mg  650 mg Oral TID Regalado, Belkys A, MD   650 mg at 08/23/21 1018    Allergies as of 08/19/2021 - Review Complete 08/19/2021  Allergen Reaction Noted   Ampicillin Hives 10/28/2017   Dilaudid [hydromorphone hcl] Nausea And Vomiting 11/11/2010   Erythromycin Hives and Other (See Comments) 11/11/2010   Penicillins Hives 11/11/2010   Sulfa drugs cross reactors Hives 11/11/2010   Declomycin [demeclocycline] Rash and Other (See Comments) 02/01/2019    Family History  Problem Relation Age of Onset   Hypertension Father    Cancer Father        melanoma/skin cancer   Colon polyps Father    Colon cancer Neg Hx    Rectal cancer Neg Hx     Stomach cancer Neg Hx    Esophageal cancer Neg Hx     Social History   Socioeconomic History   Marital status: Single    Spouse name: Not on file   Number of children: Not on file   Years of education: Not on file   Highest education level: Not on file  Occupational History   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[employer]: [employer]  Tobacco Use   Smoking status: Never   Smokeless tobacco: Never  Vaping Use   Vaping Use: Never used  Substance and Sexual Activity   Alcohol use: Not Currently    Alcohol/week: 1.0 standard drink    Types: 1 Glasses of wine per week    Comment: social   Drug use: No   Sexual activity: Yes    Birth control/protection: Post-menopausal  Other Topics Concern   Not on file  Social History Narrative   Not on file   Social Determinants of Health   Financial Resource Strain: Not on file  Food Insecurity: Not on file  Transportation Needs: Not on file  Physical Activity: Not on file  Stress: Not on file  Social Connections: Not on file  Intimate Partner Violence: Not on file    Review of Systems: Gen: Denies fever, sweats or chills. No weight loss.  CV: Denies chest pain, palpitations or edema. Resp: Denies cough, shortness of breath of hemoptysis.  GI: See HPI. GU : Denies urinary burning, blood in urine, increased urinary frequency or incontinence. MS: Denies joint pain, muscles aches or weakness. Derm: Denies rash, itchiness, skin lesions or unhealing ulcers. Psych: Denies depression, anxiety  or memory loss. Heme: Denies easy bruising, bleeding. Neuro:  + Confusion. No headaches. Endo:  Denies any problems with DM, thyroid or adrenal function.  Physical Exam: Vital signs in last 24 hours: Temp:  [98 F (36.7 C)-98.4 F (36.9 C)] 98.1 F (36.7 C) (02/10 0408) Pulse Rate:  [79-92] 79 (02/10 0408) Resp:  [15-20] 20 (02/10 0408) BP: (131-139)/(65-77) 133/77 (02/10 0408) SpO2:  [97 %-98 %] 97 % (02/10  0408) Weight:  [82.2 kg] 82.2 kg (02/10 0408) Last BM Date: 08/23/21 General:  Alert fatigued appearing 61 year old female in no acute distress. Head:  Normocephalic and atraumatic. Eyes:  No scleral icterus. Conjunctiva pink. Ears:  Normal auditory acuity. Nose:  No deformity, discharge or lesions. Mouth:  Dentition intact. No ulcers or lesions.  Neck:  Supple. No lymphadenopathy or thyromegaly.  Lungs: Breath sounds clear throughout. Heart: Regular rate and rhythm, no murmurs. Abdomen: Distended abdomen with ? ascites.  Nontender.  Positive bowel sounds all 4 quadrants. Rectal: Inflamed erythematous right anal hemorrhoid which prolapses externally and oozed a small amount of bright red blood during bedside exam.  Stool loose medium brown in rectal vault.  Surrounding perianal derm is irritated with surrounding inflammation likely due to frequent wiping. Musculoskeletal:  Symmetrical without gross deformities.  Pulses:  Normal pulses noted. Extremities:  Without clubbing or edema. Neurologic:  Alert and  oriented x 4. No focal deficits.  Skin:  Intact without significant lesions or rashes. Psych:  Alert and cooperative. Normal mood and affect.  Intake/Output from previous day: 02/09 0701 - 02/10 0700 In: 4176.3 [P.O.:580; I.V.:3596.3] Out: 1000 [Urine:1000] Intake/Output this shift: No intake/output data recorded.  Lab Results: Recent Labs    08/21/21 0820 08/22/21 0458  WBC 14.9* 15.7*  HGB 9.0* 9.6*  HCT 29.0* 31.7*  PLT 184 207   BMET Recent Labs    08/21/21 0002 08/22/21 0755 08/23/21 0556  NA 130* 133* 134*  K 4.2 4.4 4.4  CL 102 107 109  CO2 22 16* 19*  GLUCOSE 101* 112* 122*  BUN 39* 32* 27*  CREATININE 1.28* 1.05* 0.82  CALCIUM 12.2* 11.5* 12.7*   LFT Recent Labs    08/22/21 0458 08/22/21 0755 08/23/21 0556  PROT 6.6  --   --   ALBUMIN 2.5*   < > 2.6*  AST 88*  --   --   ALT 63*  --   --   ALKPHOS 156*  --   --   BILITOT 2.1*  --   --    BILIDIR 0.8*  --   --   IBILI 1.3*  --   --    < > = values in this interval not displayed.    IMPRESSION/PLAN:  47) 61 year old female with small volume painless bright red hematochezia which started this morning, associated with numerous loose bowel movements overnight and this morning.  Rectal exam showed inflamed erythematous right anal hemorrhoid which oozed a scant amount of red blood on exam.  On lactulose tid (for encephalopathy with hyperammonemia) likely contributing to hemorrhoidal bleeding. Hg 9.6 -> Today Hg 9.9.  She is hemodynamically stable. -Okay to restart lactulose twice daily. Hold if more than 4 stools/day -Anusol 25 mg suppository 1 PR HS for the next 5 nights then HS prn -Desitin apply small amount inside the anal opening into the external anal area 3 times daily for the next 7 days then tid as needed -CBC and INR in a.m. -No plans for endoscopic evaluation at this.  If she continues to have hematochezia consider a flexible sigmoidoscopy.  If she has brisk active GI bleeding recommend abdominal/pelvic CT angiogram if renal status permits or tagged red blood cell scan.  Await further recommendations per Dr. Fuller Plan.  2) History of numerous tubular adenomatous colon polyps. Her most recent colonoscopy was 05/2021, twelve tubular adenomatous polyps were removed.  Repeat colonoscopy in 1 year.  3) Metastatic breast cancer followed by oncologist Dr. Lindi Adie   4) Hypercalcemia secondary to metastatic breast cancer   5) Acute metabolic encephalopathy secondary to hypercalcemia and hyperammonemia. On Xifaxan 550 mg bid.  -lactulose as recommended above  6) AKI, improving  7) Chronic anemia. Hg 11.2 on 2/6 -> Hg 9.0 on 2/8 -> Hg 9.6 on 2/9 -> today Hg 9.9.  8) Elevated LFTs, due to liver mets.  T. Bili 2.1. Alk phos 156. AST 88. ALT 63. Ascites. S/P paracentesis 08/01/2021, 4.2 L peritoneal fluid was removed -Consider repeat abdominal sonogram to determine if enough ascites  present for paracentesis -defer to per primary service.    Christine Francis  08/23/2021, 1:44PM     Attending Physician Note   I have taken a history, reviewed the chart and examined the patient. I personally saw the patient and performed a substantial portion of this encounter in conjunction with the APP. I agree with the APP's note, impression and recommendations. My additional impressions and recommendations are as follows.   *Small volume hematochezia secondary to hemorrhoids. Anusol supp and Desitin as outlined above. No further GI evaluation is needed at this time.   *Loose, frequent stools secondary to lactulose. Reduce lactulose to bid and titrate dose for 2-3 loose stools/day per primary service.  *Elevated LFTs secondary to liver mets.   *Chronic anemia, appears stable at this time.   GI signing off. Outpatient GI follow up with Dr. Carlean Purl as needed.  Lucio Edward, MD [facility] See [facility], [facility], for our on call provider

## 2021-08-23 NOTE — Telephone Encounter (Signed)
Oral Chemotherapy Pharmacist Encounter  I spoke with patient for overview of: Xeloda (capecitabine) for the treatment of metastatic triple negative breast cancer, planned duration until disease progression or unacceptable drug toxicity.  Counseled patient on administration, dosing, side effects, monitoring, drug-food interactions, safe handling, storage, and disposal.  Patient will take Xeloda 500mg  tablets, 2 tablets (1000mg ) by mouth in AM and 2 tabs (1000mg ) by mouth in PM, within 30 minutes of finishing meals, for 14 days on, 7 days off, repeated every 21 days.  Xeloda start date: 08/23/21 (tonight is first dose)  Adverse effects include but are not limited to: fatigue, decreased blood counts, GI upset, diarrhea, mouth sores, and hand-foot syndrome.  Patient has anti-emetic on hand and knows to take it if nausea develops.   Patient will obtain anti diarrheal and alert the office of 4 or more loose stools above baseline.  Reviewed with patient importance of keeping a medication schedule and plan for any missed doses. No barriers to medication adherence identified.  Medication reconciliation performed and medication/allergy list updated.  [facility] receives [facility]. Patient informed to reach out to the pharmacy 5-7 days prior to needing the next fill of Xeloda to coordinate continued medication acquisition to prevent break in therapy.   All questions answered.  Mrs. Eckstrom and her sister voiced understanding and appreciation.   Medication education handout placed in mail for patient. Patient knows to call the office with questions or concerns. [facility] phone number provided to patient.   Drema Halon, PharmD Hematology/Oncology Clinical Pharmacist [facility] [phone]

## 2021-08-23 NOTE — Progress Notes (Signed)
[facility] Progress Note  Subjective: pt seen in room  Vitals:   08/22/21 0507 08/22/21 1336 08/22/21 2102 08/23/21 0408  BP: 136/73 139/67 131/65 133/77  Pulse: 77 92 79 79  Resp: 14 15 18 20   Temp: (!) 97.5 F (36.4 C) 98.4 F (36.9 C) 98 F (36.7 C) 98.1 F (36.7 C)  TempSrc: Oral Oral Oral Oral  SpO2: 95% 98% 97% 97%  Weight: 80.6 kg   82.2 kg  Height:        Exam: General: no distress, Ox 4 today Neck: no JVD Heart: RRR Lungs: mostly clear Abdomen: soft, non tender Extremities:trace peripheral edema Skin: warm and dry Neuro: better   Assessment/Plan: 61 year old WF with metastatic breast CA-  complication of hypercalcemia in the setting of high PTH related peptide  Assessment/ Plan 1. Hypercalcemia-  due to metastatic breast CA and elevated PTH related peptide.  It has been managed very appropriately in the OP setting but hypercalcemia worsened. Peak Ca++ on admit 2/06 was > 15.0.  4 doses of calcitonin were given. NS at 200cc /hr was given, then dec'd to 150 cc/hr w/ IV lasix 40 bid. Wt's are stable and exam is stable. She has rec'd bisphonate Rx (zometa 4mg  2/07) per oncology. Also getting prolia per oncology in OP setting. Also the chemoRx is planned to change w/ addition of Xeloda. Ca++ down to 12.2 2/08,  11.5 yest and up to 12.7 today. Will cont IVF"s/ lasix. Add sensipar which has been used in case reports for PTH-related peptide hypercalcemia.  2.Renal- some mild AKI related to the hypercalcemia. Creat 1.0-1.4 range. Follow.   3. Hypertension/volume  - wt's up but stable, exam okay. BP's good.     Rob Kaliel Bolds 08/23/2021, 8:03 AM   Recent Labs  Lab 08/22/21 0755 08/23/21 0556  K 4.4 4.4  BUN 32* 27*  CREATININE 1.05* 0.82  ALBUMIN 2.6* 2.6*  CALCIUM 11.5* 12.7*  PHOS 2.5 2.2*    Inpatient medications:  abemaciclib  100 mg Oral BID   atenolol  50 mg Oral Daily   cinacalcet  30 mg Oral BID WC   cinacalcet  60 mg Oral Once    dexamethasone  1 mg Oral BID WC   enoxaparin (LOVENOX) injection  40 mg Subcutaneous Q24H   feeding supplement  1 Container Oral BID BM   feeding supplement  237 mL Oral BID BM   furosemide  40 mg Intravenous BID   lactulose  20 g Oral TID   montelukast  10 mg Oral Daily   potassium chloride SA  40 mEq Oral BID   sodium bicarbonate  650 mg Oral TID    sodium chloride 150 mL/hr at 08/23/21 0729   acetaminophen **OR** acetaminophen, albuterol, ALPRAZolam, fluticasone furoate-vilanterol, ondansetron **OR** ondansetron (ZOFRAN) IV

## 2021-08-23 NOTE — Telephone Encounter (Signed)
[employer] Dis[employer] forms for claim: [id number] and [id number] / [id number] successfully returned to [facility] via fax [phone]).  Original copies mailed to patient address on file. [address] [place] [address] No further action.  Form process completed.

## 2021-08-23 NOTE — Progress Notes (Signed)
Progress Note   Patient: Christine Francis EQA:[id number] DOB: [birth year] DOA: 08/19/2021     4 DOS: the patient was seen and examined on 08/23/2021   Brief hospital course: 61 year old past medical history significant for breast cancer since 2016 with mets to lungs, liver, hypertension.  Treated as an outpatient for hypercalcemia.  Presents with confusion, hypercalcemia calcium level 15, hyperammonemia ammonia 75.  AKI creatinine 1.2 from prior baseline 0.7.  Patient was started on IV fluids, Lasix.  Nephrology was consulted to assist with hypercalcemia. Calcium level fluctuates.  Received a dose of Zometa. Treated with calcitonin.  Develops mild hematochezia 2/10. Has history of hemorrhoids. IV protonix started. GI consulted.   Assessment and Plan: * Hypercalcemia- (present on admission) Secondary to  metastatic breast CA:  paraneoplastic syndrome. Completed Calcitonin [place] x 2 days Continue to hold  HCTZ Received a dose of Zometa 2/07. Nephrology consulted, and following.  Plan to continue with IV fluids and lasix.  Start sensipar.  Might repeat Zometa dose 2/11. Ca increase to 12.     Hematochezia She had small multiples blood clots BM today.  HD repeated stable.  Started IV protonix.  Hold Lovenox.  GI consulted.  Started on Tx for hemorrhoids.   Acute metabolic encephalopathy- (present on admission) Secondary to hyperammonemia + hypercalcemia. Continue with treatment of hypercalcemia, continue with lactulose. Improving.  Evaluated by PT, no HH pt recommend.  Change lactulose to BID, will start rifaximin.   AKI (acute kidney injury) ([place]) Likely related to hypovolemia, hypercalcemia.  Prior Cr month ago 0.4. Cr peak to 1.4.  Continue with IV fluids. Cr stable.   Malignant neoplasm of upper-outer quadrant of left breast in female, estrogen receptor positive ([place]) Metastatic dz. On chemo, DR Lindi Adie following.  Currently causing hypercalcemia and  hyperammonemia. Continue Verzenio. Plan to resume  Xeloda in patient when family get meds.  Receiving radiation therapy.   HTN (hypertension)- (present on admission) Hydrochlorothiazide discontinued due to hypercalcemia. Continue with atenolol. Currently on Lasix for hypercalcemia  Leukocytosis Chronic leukocytosis, WBC at 19 3 weeks ago. Likely related to steroids.   Metabolic acidosis Continue with IV fluids.  Started  Bicarb tablet.  Improved.   Protein-calorie malnutrition, moderate ([place]) Continue with supplement.   Anemia associated with chemotherapy Hb down to baseline at 9/  Prior HB range: 9.3-10. Monitor Hb in setting hematochezia.   Abnormal transaminases Likely related to liver mets.  LFT trending down.  Ammonia level trending down. 81---57  Hyponatremia Related to hypovolemia  Improved with IV fluids.   Hyperammonemia ([place])- (present on admission) Due to metastatic breast CA with liver involvement. Home lactulose dose recently decreased. Ammonia level at 75 on admission. Ammonia down to 57 Change lactulose to BID. Add rifaximin.       Subjective:  She is sleepy, couldn't sleep last night due to having multiples BM/  She feels tired. She was up yesterday and walk twice.  She had small multiples tiny clots mixt with stool//    Physical Exam: Vitals:   08/22/21 1336 08/22/21 2102 08/23/21 0408 08/23/21 1442  BP: 139/67 131/65 133/77 136/72  Pulse: 92 79 79 86  Resp: 15 18 20 18   Temp: 98.4 F (36.9 C) 98 F (36.7 C) 98.1 F (36.7 C) 97.7 F (36.5 C)  TempSrc: Oral Oral Oral Oral  SpO2: 98% 97% 97% 95%  Weight:   82.2 kg   Height:       General; sleepy today.  Lung; CTA Abdomen: soft, distended, no  rigidity  Extremity; no edmea  Data Reviewed:  Cbc, Bmet reviewed.   Family Communication: sister at bedside.   Disposition: Status is: Inpatient Remains inpatient appropriate because: Management of Hypercalcemia.           Planned Discharge Destination: Home     Time spent: 45 minutes  Author: Elmarie Shiley, MD 08/23/2021 3:06 PM  For on call review [email or web address].

## 2021-08-23 NOTE — Progress Notes (Signed)
Hematology oncology: Subjective: Patient is extremely fatigued today.  She reports hemorrhoidal bleeding.  Her exhaustion is related to the metastatic disease and hypercalcemia. Vitals:   08/23/21 0408 08/23/21 1442  BP: 133/77 136/72  Pulse: 79 86  Resp: 20 18  Temp: 98.1 F (36.7 C) 97.7 F (36.5 C)  SpO2: 97% 95%   Objective: Jaundice CBC    Component Value Date/Time   WBC 12.9 (H) 08/23/2021 1155   RBC 3.26 (L) 08/23/2021 1155   HGB 9.9 (L) 08/23/2021 1155   HGB 10.3 (L) 08/14/2021 0917   HGB 13.8 03/27/2015 0937   HCT 31.8 (L) 08/23/2021 1155   HCT 41.0 03/27/2015 0937   PLT 181 08/23/2021 1155   PLT 325 08/14/2021 0917   PLT 214 03/27/2015 0937   MCV 97.5 08/23/2021 1155   MCV 85.6 03/27/2015 0937   MCH 30.4 08/23/2021 1155   MCHC 31.1 08/23/2021 1155   RDW 21.8 (H) 08/23/2021 1155   RDW 13.4 03/27/2015 0937   LYMPHSABS 1.2 08/14/2021 0917   LYMPHSABS 2.0 03/27/2015 0937   MONOABS 0.8 08/14/2021 0917   MONOABS 0.4 03/27/2015 0937   EOSABS 0.0 08/14/2021 0917   EOSABS 0.2 03/27/2015 0937   BASOSABS 0.0 08/14/2021 0917   BASOSABS 0.1 03/27/2015 0937   CMP Latest Ref Rng & Units 08/23/2021 08/22/2021 08/21/2021  Glucose 70 - 99 mg/dL 122(H) 112(H) 101(H)  BUN 8 - 23 mg/dL 27(H) 32(H) 39(H)  Creatinine 0.44 - 1.00 mg/dL 0.82 1.05(H) 1.28(H)  Sodium 135 - 145 mmol/L 134(L) 133(L) 130(L)  Potassium 3.5 - 5.1 mmol/L 4.4 4.4 4.2  Chloride 98 - 111 mmol/L 109 107 102  CO2 22 - 32 mmol/L 19(L) 16(L) 22  Calcium 8.9 - 10.3 mg/dL 12.7(H) 11.5(H) 12.2(H)  Total Protein 6.5 - 8.1 g/dL - 6.6 -  Total Bilirubin 0.3 - 1.2 mg/dL - 2.1(H) -  Alkaline Phos 38 - 126 U/L - 156(H) -  AST 15 - 41 U/L - 88(H) -  ALT 0 - 44 U/L - 63(H) -    Assessment and plan Metastatic breast cancer with liver metastases ER/PR positive HER2 negative: Not responding to Verzenio based on lab parameters.  We are starting on Xeloda tonight.  She will take 1000 mg p.o. twice daily. We will discontinue  Verzenio Severe hypercalcemia: It is mild boggling the degree to which she has the severe hypercalcemia which does respond to bisphosphonate therapy but only for very short time. Liver dysfunction Severe fatigue I discussed with her that we can see how she does over this weekend to make a final determination on the overall plan of care.  She is extremely exhausted at this point. We may be considering palliative care/hospice options if she does not improve.

## 2021-08-23 NOTE — Progress Notes (Signed)
Occupational Therapy Treatment Patient Details Name: Christine Francis MRN: [id number] DOB: 01/05/1961 Today's Date: 08/23/2021   History of present illness Patient is a 61 year old female who presented to the hosptial with increased confusion and increased calcium levels. patient was found to have hypercalcemia.PMH: breast cancer s/p bilateral mastectomy,mets to lungs and liver,anxiety disorder, hypercalcemia, HTN.   OT comments  With encouragement patient agreeable to out of bed activity. Overall supervision level with rolling walker for safety during standing at sink and transfer to recliner chair. Patient attempts to sit on chair when not aligned (would of been sitting on chair arm.) Instruct patient needing to have legs touching chair and able to reach both chair arms. Continue with POC.    Recommendations for follow up therapy are one component of a multi-disciplinary discharge planning process, led by the attending physician.  Recommendations may be updated based on patient status, additional functional criteria and insurance authorization.    Follow Up Recommendations  No OT follow up    Assistance Recommended at Discharge Intermittent Supervision/Assistance  Patient can return home with the following  A little help with walking and/or transfers;A little help with bathing/dressing/bathroom;Assistance with cooking/housework;Direct supervision/assist for financial management;Assist for transportation;Direct supervision/assist for medications management;Help with stairs or ramp for entrance   Equipment Recommendations  None recommended by OT       Precautions / Restrictions Precautions Precautions: Fall Precaution Comments: poor safety awareness Restrictions Weight Bearing Restrictions: No       Mobility Bed Mobility Overal bed mobility: Modified Independent                     Balance Overall balance assessment: Needs assistance Sitting-balance support: Feet  supported Sitting balance-Leahy Scale: Good     Standing balance support: No upper extremity supported Standing balance-Leahy Scale: Fair Standing balance comment: oral care at sink                           ADL either performed or assessed with clinical judgement   ADL Overall ADL's : Needs assistance/impaired     Grooming: Wash/dry face;Oral care;Supervision/safety;Standing Grooming Details (indicate cue type and reason): Patient needing verbal cues for turning off sink                 Toilet Transfer: Supervision/safety;Ambulation;Rolling walker (2 wheels) Toilet Transfer Details (indicate cue type and reason): To recliner chair. Patient attempts to sit down when not aligned with chair instruct patient both legs should be touching and able to reach chair arms.         Functional mobility during ADLs: Supervision/safety        Cognition Arousal/Alertness: Awake/alert Behavior During Therapy: WFL for tasks assessed/performed Overall Cognitive Status: Within Functional Limits for tasks assessed                                 General Comments: a little flat affect, reports very tired being up all night having BMs                   Pertinent Vitals/ Pain       Pain Assessment Pain Assessment: No/denies pain         Frequency  Min 2X/week        Progress Toward Goals  OT Goals(current goals can now be found in the care plan section)  Progress towards OT  goals: Progressing toward goals  Acute Rehab OT Goals Patient Stated Goal: "So tired" OT Goal Formulation: With patient Time For Goal Achievement: 09/04/21 Potential to Achieve Goals: Good ADL Goals Pt Will Transfer to Toilet: with modified independence;ambulating;regular height toilet Pt Will Perform Toileting - Clothing Manipulation and hygiene: with supervision;sit to/from stand;sitting/lateral leans Additional ADL Goal #1: Patient will perform 10 min functional  activity or exercise activity as evidence of improving activity tolerance  Plan Discharge plan remains appropriate       AM-PAC OT "6 Clicks" Daily Activity     Outcome Measure   Help from another person eating meals?: None Help from another person taking care of personal grooming?: A Little Help from another person toileting, which includes using toliet, bedpan, or urinal?: A Little Help from another person bathing (including washing, rinsing, drying)?: A Little Help from another person to put on and taking off regular upper body clothing?: A Little Help from another person to put on and taking off regular lower body clothing?: A Little 6 Click Score: 19    End of Session Equipment Utilized During Treatment: Rolling walker (2 wheels)  OT Visit Diagnosis: Unsteadiness on feet (R26.81)   Activity Tolerance Patient tolerated treatment well   Patient Left in chair;with call bell/phone within reach;with family/visitor present   Nurse Communication Mobility status        Time: 2863-8177 OT Time Calculation (min): 15 min  Charges: OT General Charges $OT Visit: 1 Visit OT Treatments $Self Care/Home Management : 8-22 mins  Delbert Phenix OT OT pager: [phone]   Rosemary Holms 08/23/2021, 12:09 PM

## 2021-08-23 NOTE — Assessment & Plan Note (Addendum)
She had small multiples blood clots mix with stool.  Hb repeated stable.  Started IV protonix.  GI consulted. Started on Tx for hemorrhoids.

## 2021-08-24 LAB — PROTIME-INR
INR: 1.3 — ABNORMAL HIGH (ref 0.8–1.2)
Prothrombin Time: 16.6 seconds — ABNORMAL HIGH (ref 11.4–15.2)

## 2021-08-24 LAB — RENAL FUNCTION PANEL
Albumin: 2.4 g/dL — ABNORMAL LOW (ref 3.5–5.0)
Anion gap: 6 (ref 5–15)
BUN: 28 mg/dL — ABNORMAL HIGH (ref 8–23)
CO2: 21 mmol/L — ABNORMAL LOW (ref 22–32)
Calcium: 12.6 mg/dL — ABNORMAL HIGH (ref 8.9–10.3)
Chloride: 112 mmol/L — ABNORMAL HIGH (ref 98–111)
Creatinine, Ser: 0.97 mg/dL (ref 0.44–1.00)
GFR, Estimated: >60 mL/min (ref >60)
Glucose, Bld: 111 mg/dL — ABNORMAL HIGH (ref 70–99)
Phosphorus: 2.8 mg/dL (ref 2.5–4.6)
Potassium: 3.9 mmol/L (ref 3.5–5.1)
Sodium: 139 mmol/L (ref 135–145)

## 2021-08-24 LAB — CBC
HCT: 30.4 % — ABNORMAL LOW (ref 36.0–46.0)
Hemoglobin: 9.5 g/dL — ABNORMAL LOW (ref 12.0–15.0)
MCH: 30.4 pg (ref 26.0–34.0)
MCHC: 31.3 g/dL (ref 30.0–36.0)
MCV: 97.1 fL (ref 80.0–100.0)
Platelets: 144 10*3/uL — ABNORMAL LOW (ref 150–400)
RBC: 3.13 MIL/uL — ABNORMAL LOW (ref 3.87–5.11)
RDW: 22.1 % — ABNORMAL HIGH (ref 11.5–15.5)
WBC: 14 10*3/uL — ABNORMAL HIGH (ref 4.0–10.5)
nRBC: 0.1 % (ref 0.0–0.2)

## 2021-08-24 LAB — AMMONIA: Ammonia: 46 umol/L — ABNORMAL HIGH (ref 9–35)

## 2021-08-24 MED ORDER — ZOLEDRONIC ACID 4 MG/5ML IV CONC
4.0000 mg | Freq: Once | INTRAVENOUS | Status: DC
  Filled 2021-08-24: qty 5

## 2021-08-24 MED ORDER — LORAZEPAM 1 MG PO TABS: 1.0000 mg | ORAL_TABLET | ORAL | Status: DC | PRN

## 2021-08-24 MED ORDER — LORAZEPAM 2 MG/ML PO CONC: 1.0000 mg | ORAL | Status: DC | PRN

## 2021-08-24 MED ORDER — LORAZEPAM 1 MG PO TABS: 1.0000 mg | ORAL_TABLET | ORAL | 0 refills | Status: AC | PRN

## 2021-08-24 MED ORDER — FENTANYL CITRATE PF 50 MCG/ML IJ SOSY: 25.0000 ug | PREFILLED_SYRINGE | INTRAMUSCULAR | Status: DC | PRN

## 2021-08-24 MED ORDER — BACLOFEN 10 MG PO TABS: 5.0000 mg | ORAL_TABLET | Freq: Two times a day (BID) | ORAL | Status: DC | PRN

## 2021-08-24 MED ORDER — BIOTENE DRY MOUTH MT LIQD: 15.0000 mL | OROMUCOSAL | Status: DC | PRN

## 2021-08-24 MED ORDER — HYDROCORTISONE ACETATE 25 MG RE SUPP: 25.0000 mg | Freq: Every day | RECTAL | 0 refills | Status: AC

## 2021-08-24 MED ORDER — ALBUTEROL SULFATE (2.5 MG/3ML) 0.083% IN NEBU: 2.5000 mg | INHALATION_SOLUTION | RESPIRATORY_TRACT | Status: DC | PRN

## 2021-08-24 MED ORDER — TRAMADOL HCL 50 MG PO TABS: 50.0000 mg | ORAL_TABLET | Freq: Four times a day (QID) | ORAL | Status: DC | PRN

## 2021-08-24 MED ORDER — BIOTENE DRY MOUTH MT LIQD: 15.0000 mL | OROMUCOSAL | 0 refills | Status: AC | PRN

## 2021-08-24 MED ORDER — BACLOFEN 5 MG PO TABS
5.0000 mg | ORAL_TABLET | Freq: Two times a day (BID) | ORAL | 0 refills | Status: AC | PRN
Start: 2021-08-24 — End: ?

## 2021-08-24 MED ORDER — ACETAMINOPHEN 325 MG PO TABS: 650.0000 mg | ORAL_TABLET | Freq: Four times a day (QID) | ORAL | 0 refills | Status: AC | PRN

## 2021-08-24 MED ORDER — LORAZEPAM 2 MG/ML IJ SOLN: 1.0000 mg | INTRAMUSCULAR | Status: DC | PRN

## 2021-08-24 NOTE — Progress Notes (Signed)
Engineer, maintenance [facility]) Hospital Liaison note.   Received request from [place] for family interest in [facility] with request for transfer today. Chart reviewed and eligibility confirmed.   Spoke to daughter Hinton Dyer to confirm interest and explain services. Family agreeable to transfer today. CSW aware.  Registration paperwork will be completed by Hinton Dyer via email. Dr. Orpah Melter to assume care per family request.    RN please call report to [phone]. Please arrange transport for patient once consents complete.   Thank you,   Clementeen Hoof, BSN, RN [place] (listed on [facility] under Hospice and [place] [facility] [phone]   [phone]

## 2021-08-24 NOTE — Progress Notes (Signed)
Progress Note   Patient: Christine Francis UXN:[id number] DOB: August 11, 1960 DOA: 08/19/2021     5 DOS: the patient was seen and examined on 08/24/2021   Brief hospital course: 61 year old past medical history significant for breast cancer since 2016 with mets to lungs, liver, hypertension.  Treated as an outpatient for hypercalcemia.  Presents with confusion, hypercalcemia calcium level 15, hyperammonemia ammonia 75.  AKI creatinine 1.2 from prior baseline 0.7.  Patient was started on IV fluids, Lasix.  Nephrology was consulted to assist with hypercalcemia. Calcium level fluctuates.  Received a dose of Zometa. Treated with calcitonin.  Develops mild hematochezia 2/10. Has history of hemorrhoids. IV protonix started. GI consulted, started on treatment for hemorrhoids.   Calcium continue to be elevated despite medical treatment. Dr Lindi Adie discussed with patient and family, patient is tired, feels weak, plan to transition to comfort care. [facility]. Comfort care orders placed.   Assessment and Plan: * Hypercalcemia- (present on admission) Secondary to  metastatic breast CA:  paraneoplastic syndrome. Completed Calcitonin [place] x 2 days Continue to hold  HCTZ Received a dose of Zometa 2/07. Nephrology consulted, and following.  Treated with IV fluids and lasix.  Started  sensipar.  Ca still at 12, corrected by albumin 13.5 Despite medical management calcium continue to be elevated.  Dr Lindi Adie discussed with patient and family, plan to transition to comfort care.    Hematochezia Christine Francis had small multiples blood clots mix with stool.  Hb repeated stable.  Started IV protonix.  GI consulted. Started on Tx for hemorrhoids.   Acute metabolic encephalopathy- (present on admission) Secondary to hyperammonemia + hypercalcemia. Plan to hold lactulose, patient had multiples BM, plan to focus on comfort care.   AKI (acute kidney injury) ([place]) Likely related to hypovolemia,  hypercalcemia.  Prior Cr month ago 0.4. Cr peak to 1.4.  Treated with IV fluids.   Malignant neoplasm of upper-outer quadrant of left breast in female, estrogen receptor positive ([place]) Metastatic dz. On chemo, Dr. Lindi Adie following.  Currently causing hypercalcemia and hyperammonemia. Received one dose of Xeloda Received  radiation therapy.  Transition to comfort care.   HTN (hypertension)- (present on admission) Hydrochlorothiazide discontinued due to hypercalcemia. Continue with atenolol.   Leukocytosis Chronic leukocytosis, WBC at 19 3 weeks ago. Likely related to steroids.   Metabolic acidosis Treated  with IV fluids.  Started  Bicarb tablet.  Improved.   Protein-calorie malnutrition, moderate ([place]) Continue with supplement.   Anemia associated with chemotherapy Prior HB range: 9.3-10. Hb stable.   Abnormal transaminases Likely related to liver mets.  LFT trending down.  Ammonia level trending down. 81---57--46 No further labs check, transition to comfort care.   Hyponatremia Related to hypovolemia  resolved  Hyperammonemia ([place])- (present on admission) Due to metastatic breast CA with liver involvement. Ammonia level at 75 on admission. Ammonia down to 57 Plan to hold lactulose, transition to comfort care.         Subjective:  Christine Francis is sleepy, Christine Francis feels tired, Christine Francis had some left leg pain but right now Christine Francis is not having any pain. We talked about we can make sure that he is comfortable, IV fentanyl, and IV Ativan ordered as needed  Physical Exam: Vitals:   08/23/21 0408 08/23/21 1442 08/23/21 2141 08/24/21 0500  BP: 133/77 136/72 (!) 149/83 124/78  Pulse: 79 86 (!) 103 80  Resp: 20 18 18 18   Temp: 98.1 F (36.7 C) 97.7 F (36.5 C) 97.9 F (36.6 C) 97.7  F (36.5 C)  TempSrc: Oral Oral Oral Oral  SpO2: 97% 95% 96% 97%  Weight: 82.2 kg   81.3 kg  Height:       General: Sleepy, appears weak and tired Cardiovascular: S1-S2 regular rhythm and  rate Lungs: Clear to auscultation  Data Reviewed:  Ammonia level and  calcium level reviewed  Family Communication: Care discussed with sister who is at bedside.  Disposition: Status is: Inpatient Remains inpatient appropriate because: Admitted for management of hypercalcemia and hyperammonemia, now transition to comfort care          Planned Discharge Destination:  [facility].      Time spent: 45 minutes  Author: Elmarie Shiley, MD 08/24/2021 1:17 PM  For on call review [email or web address].

## 2021-08-24 NOTE — Progress Notes (Signed)
Hematology oncology Counseling and discussion: We spent a lot of time this morning discussing about goals of care. Patient is extremely exhausted and does not have any further interest in pursuing life-prolonging treatments. She is exhausted with all the treatments to lower the calcium as well as to lower her ammonia levels. She clearly expressed her wishes to stop all definitive treatments and pursue end-of-life/hospice care. She would be candidate for [facility] because she has only very few days to live. She was in clear mind and expressed her wishes to her family as well.  They were all agreeable and supportive. We will consult hospice.

## 2021-08-24 NOTE — Discharge Summary (Signed)
Physician Discharge Summary   Patient: Christine Francis MRN: [id number] DOB: 1961/06/11  Admit date:     08/19/2021  Discharge date: 08/24/21  Discharge Physician: Elmarie Shiley   PCP: Molli Posey, MD   Recommendations at discharge:   Comfort care.    Discharge Diagnoses: Principal Problem:   Hypercalcemia Active Problems:   Acute metabolic encephalopathy   Hematochezia   Malignant neoplasm of upper-outer quadrant of left breast in female, estrogen receptor positive (HCC)   AKI (acute kidney injury) ([place])   HTN (hypertension)   Hyperammonemia (HCC)   Hypercalcemia of malignancy   Hyponatremia   Abnormal transaminases   Anemia associated with chemotherapy   Protein-calorie malnutrition, moderate (HCC)   Metabolic acidosis   Leukocytosis  Resolved Problems:   * No resolved hospital problems. [facility] Course: 62 year old past medical history significant for breast cancer since 2016 with mets to lungs, liver, hypertension.  Treated as an outpatient for hypercalcemia.  Presents with confusion, hypercalcemia calcium level 15, hyperammonemia ammonia 75.  AKI creatinine 1.2 from prior baseline 0.7.  Patient was started on IV fluids, Lasix.  Nephrology was consulted to assist with hypercalcemia. Calcium level fluctuates.  Received a dose of Zometa. Treated with calcitonin.  Develops mild hematochezia 2/10. Has history of hemorrhoids. IV protonix started. GI consulted, started on treatment for hemorrhoids.   Calcium continue to be elevated despite medical treatment. Dr Lindi Adie discussed with patient and family, patient is tired, feels weak, plan to transition to comfort care. Referral made for [facility]. Comfort care orders placed.   Assessment and Plan: * Hypercalcemia- (present on admission) Secondary to  metastatic breast CA:  paraneoplastic syndrome. Completed Calcitonin [place] x 2 days Continue to hold  HCTZ Received a dose of Zometa 2/07. Nephrology  consulted, and following.  Treated with IV fluids and lasix.  Started  sensipar.  Ca still at 12, corrected by albumin 13.5 Despite medical management calcium continue to be elevated.  Dr Lindi Adie discussed with patient and family, plan to transition to comfort care.    Hematochezia She had small multiples blood clots mix with stool.  Hb repeated stable.  Started IV protonix.  GI consulted. Started on Tx for hemorrhoids.   Acute metabolic encephalopathy- (present on admission) Secondary to hyperammonemia + hypercalcemia. Plan to hold lactulose, patient had multiples BM, plan to focus on comfort care.   AKI (acute kidney injury) ([place]) Likely related to hypovolemia, hypercalcemia.  Prior Cr month ago 0.4. Cr peak to 1.4.  Treated with IV fluids.   Malignant neoplasm of upper-outer quadrant of left breast in female, estrogen receptor positive ([place]) Metastatic dz. On chemo, Dr. Lindi Adie following.  Currently causing hypercalcemia and hyperammonemia. Received one dose of Xeloda Received  radiation therapy.  Transition to comfort care.   HTN (hypertension)- (present on admission) Hydrochlorothiazide discontinued due to hypercalcemia. Continue with atenolol.   Leukocytosis Chronic leukocytosis, WBC at 19 3 weeks ago. Likely related to steroids.   Metabolic acidosis Treated  with IV fluids.  Started  Bicarb tablet.  Improved.   Protein-calorie malnutrition, moderate ([place]) Continue with supplement.   Anemia associated with chemotherapy Prior HB range: 9.3-10. Hb stable.   Abnormal transaminases Likely related to liver mets.  LFT trending down.  Ammonia level trending down. 81---57--46 No further labs check, transition to comfort care.   Hyponatremia Related to hypovolemia  resolved  Hyperammonemia ([place])- (present on admission) Due to metastatic breast CA with liver involvement. Ammonia level at 75 on admission.  Ammonia down to 57 Plan to hold lactulose, transition  to comfort care.    Could use IV fentanyl PRN for pain as needed.  Patient to be discharge to [facility] today.        Pain control - [employer] Controlled Substance Reporting System database was reviewed. and patient was instructed, not to drive, operate heavy machinery, perform activities at heights, swimming or participation in water activities or provide baby-sitting services while on Pain, Sleep and Anxiety Medications; until their outpatient Physician has advised to do so again. Also recommended to not to take more than prescribed Pain, Sleep and Anxiety Medications.   Consultants: Dr Lindi Adie, Nephrology  Procedures performed: none  Disposition: Hospice care Diet recommendation:  Discharge Diet Orders (From admission, onward)     Start     Ordered   08/24/21 0000  Diet - low sodium heart healthy        08/24/21 1456           Regular diet  DISCHARGE MEDICATION: Allergies as of 08/24/2021       Reactions   Ampicillin Hives   Dilaudid [hydromorphone Hcl] Nausea And Vomiting   Erythromycin Hives, Other (See Comments)   Penicillins Hives   Did it involve swelling of the face/tongue/throat, SOB, or low BP? No Did it involve sudden or severe rash/hives, skin peeling, or any reaction on the inside of your mouth or nose? Yes Did you need to seek medical attention at a hospital or doctor's office? No When did it last happen?  childhood     If all above answers are "NO", may proceed with cephalosporin use.   Sulfa Drugs Cross Reactors Hives   Declomycin [demeclocycline] Rash, Other (See Comments)   Childhood allergy        Medication List     STOP taking these medications    abemaciclib 100 MG tablet Commonly known as: VERZENIO   ALPRAZolam 0.5 MG tablet Commonly known as: XANAX   atenolol 50 MG tablet Commonly known as: TENORMIN   EPINEPHrine 0.3 mg/0.3 mL Soaj injection Commonly known as: EPI-PEN   furosemide 40 MG tablet Commonly known as:  LASIX   lactulose 10 GM/15ML solution Commonly known as: CHRONULAC   montelukast 10 MG tablet Commonly known as: SINGULAIR   Mucinex Sinus-Max 5-10-200-325 MG Tabs Generic drug: Phenylephrine-DM-GG-APAP   potassium chloride SA 20 MEQ tablet Commonly known as: KLOR-CON M   spironolactone 25 MG tablet Commonly known as: ALDACTONE       TAKE these medications    acetaminophen 325 MG tablet Commonly known as: TYLENOL Take 2 tablets (650 mg total) by mouth every 6 (six) hours as needed for mild pain (or Fever >/= 101).   albuterol 108 (90 Base) MCG/ACT inhaler Commonly known as: VENTOLIN HFA Inhale 1-2 puffs into the lungs every 6 (six) hours as needed for wheezing or shortness of breath.   antiseptic oral rinse Liqd Apply 15 mLs topically as needed for dry mouth.   Baclofen 5 MG Tabs Take 5 mg by mouth every 12 (twelve) hours as needed for muscle spasms (hiccoughs).   Breo Ellipta 200-25 MCG/ACT Aepb Generic drug: fluticasone furoate-vilanterol Inhale 1 puff into the lungs daily as needed (shortness of breath).   dexamethasone 1 MG tablet Commonly known as: DECADRON Take 1 tablet (1 mg total) by mouth 2 (two) times daily with a meal.   hydrocortisone 25 MG suppository Commonly known as: ANUSOL-HC Place 1 suppository (25 mg total) rectally at bedtime.  LORazepam 1 MG tablet Commonly known as: ATIVAN Take 1 tablet (1 mg total) by mouth every 4 (four) hours as needed for anxiety.   omeprazole 20 MG tablet Commonly known as: PRILOSEC OTC Take 20 mg by mouth daily.         Discharge Exam: Filed Weights   08/22/21 0507 08/23/21 0408 08/24/21 0500  Weight: 80.6 kg 82.2 kg 81.3 kg   General sleepy CVS; S 1, S 2 RRR Lung CTA  Condition at discharge: poor  The results of significant diagnostics from this hospitalization (including imaging, microbiology, ancillary and laboratory) are listed below for reference.   Imaging Studies: DG Chest 2 View  Result  Date: 08/19/2021 CLINICAL DATA:  Metastatic breast cancer, weakness, confusion, altered level of consciousness EXAM: CHEST - 2 VIEW COMPARISON:  07/22/2021 FINDINGS: Frontal and lateral views of the chest demonstrate a stable cardiac silhouette. No acute airspace disease, effusion, or pneumothorax. No acute displaced fracture. IMPRESSION: 1. No acute intrathoracic process. Electronically Signed   By: Randa Ngo M.D.   On: 08/19/2021 18:44   CT HEAD WO CONTRAST (5MM)  Result Date: 08/19/2021 CLINICAL DATA:  Altered mental status. Stage IV breast cancer metastases to the liver and left eye and orbit. EXAM: CT HEAD WITHOUT CONTRAST TECHNIQUE: Contiguous axial images were obtained from the base of the skull through the vertex without intravenous contrast. RADIATION DOSE REDUCTION: This exam was performed according to the departmental dose-optimization program which includes automated exposure control, adjustment of the mA and/or kV according to patient size and/or use of iterative reconstruction technique. COMPARISON:  07/22/2021.  Brain MR dated 07/31/2021. FINDINGS: Brain: Normal appearing cerebral hemispheres and posterior fossa structures. Normal size and position of the ventricles. No intracranial hemorrhage, mass lesion or CT evidence of acute infarction. Vascular: No hyperdense vessel or unexpected calcification. Skull: Normal. Negative for fracture or focal lesion. Sinuses/Orbits: The left orbital mass seen on the recent MRI is again demonstrated. Unremarkable right orbit and paranasal sinuses. Other: Bilateral concha bullosa. IMPRESSION: 1. No acute abnormality. 2. Stable left orbital mass concerning for a metastasis. Electronically Signed   By: Claudie Revering M.D.   On: 08/19/2021 18:55   MR Brain W Wo Contrast  Result Date: 07/31/2021 CLINICAL DATA:  Breast cancer, staging EXAM: MRI HEAD WITHOUT AND WITH CONTRAST TECHNIQUE: Multiplanar, multiecho pulse sequences of the brain and surrounding structures  were obtained without and with intravenous contrast. CONTRAST:  55mL GADAVIST GADOBUTROL 1 MMOL/ML IV SOLN COMPARISON:  CT head 07/22/2021 FINDINGS: Brain: There is no evidence of acute intracranial hemorrhage, extra-axial fluid collection, or acute infarct. Parenchymal volume is normal. The ventricles are normal in size. Scattered nonenhancing foci of FLAIR signal abnormality in the subcortical and periventricular white matter likely reflects sequela of minimal chronic white matter microangiopathy. There is no suspicious parenchymal signal abnormality. There is no mass lesion or abnormal enhancement. There is no midline shift. Vascular: Normal flow voids. Skull and upper cervical spine: Normal marrow signal. Sinuses/Orbits: There is enhancing tissue in the left orbit measuring approximately 1.6 cm AP x 1.0 cm TV by 1.2 cm cc. Other: None. IMPRESSION: 1. Enhancing lesion in the left orbit measuring up to 1.6 cm concerning for orbital metastatic disease. 2. No evidence of intracranial metastatic disease. 3. For follow-up studies, recommend adding dedicated orbital MRI. Electronically Signed   By: Valetta Mole M.D.   On: 07/31/2021 17:11   US Paracentesis  Result Date: 08/01/2021 INDICATION: Patient with history of metastatic breast cancer, recent hemorrhage  following percutaneous liver biopsy requiring embolization and recurrent ascites. Request received for therapeutic paracentesis. EXAM: ULTRASOUND GUIDED THERAPEUTIC PARACENTESIS MEDICATIONS: 10 mL 1% lidocaine COMPLICATIONS: None immediate. PROCEDURE: Informed written consent was obtained from the patient after a discussion of the risks, benefits and alternatives to treatment. A timeout was performed prior to the initiation of the procedure. Initial ultrasound scanning demonstrates a large amount of ascites within the right mid to lower abdominal quadrant. The right mid to lower abdomen was prepped and draped in the usual sterile fashion. 1% lidocaine was used  for local anesthesia. Following this, a 19 gauge, 10-cm, Yueh catheter was introduced. An ultrasound image was saved for documentation purposes. The paracentesis was performed. The catheter was removed and a dressing was applied. The patient tolerated the procedure well without immediate post procedural complication. FINDINGS: A total of approximately 4.2 liters of bloody fluid was removed. IMPRESSION: Successful ultrasound-guided therapeutic paracentesis yielding 4.2 liters of peritoneal fluid. Read by: Rowe Robert, PA-C Electronically Signed   By: Markus Daft M.D.   On: 08/01/2021 16:51   [place] ASCITES (ABDOMEN LIMITED)  Result Date: 08/12/2021 CLINICAL DATA:  61 year old female with ascites. Evaluate for possible paracentesis. EXAM: LIMITED ABDOMEN ULTRASOUND FOR ASCITES TECHNIQUE: Limited ultrasound survey for ascites was performed in all four abdominal quadrants. COMPARISON:  Prior paracentesis 08/01/2021 FINDINGS: Sonographic interrogation of the 4 quadrants of the abdomen demonstrates only trace ascites which is insufficient for percutaneous drainage at this time. Paracentesis was deferred. IMPRESSION: Trace ascites, insufficient for paracentesis. Electronically Signed   By: Jacqulynn Cadet M.D.   On: 08/12/2021 10:16    Microbiology: Results for orders placed or performed during the hospital encounter of 08/19/21  Resp Panel by RT-PCR (Flu A&B, Covid) Nasopharyngeal Swab     Status: None   Collection Time: 08/19/21  6:11 PM   Specimen: Nasopharyngeal Swab; Nasopharyngeal(NP) swabs in vial transport medium  Result Value Ref Range Status   SARS Coronavirus 2 by RT PCR NEGATIVE NEGATIVE Final    Comment: (NOTE) SARS-CoV-2 target nucleic acids are NOT DETECTED.  The SARS-CoV-2 RNA is generally detectable in upper respiratory specimens during the acute phase of infection. The lowest concentration of SARS-CoV-2 viral copies this assay can detect is 138 copies/mL. A negative result does not  preclude SARS-Cov-2 infection and should not be used as the sole basis for treatment or other patient management decisions. A negative result may occur with  improper specimen collection/handling, submission of specimen other than nasopharyngeal swab, presence of viral mutation(s) within the areas targeted by this assay, and inadequate number of viral copies(<138 copies/mL). A negative result must be combined with clinical observations, patient history, and epidemiological information. The expected result is Negative.  Fact Sheet for Patients:  EntrepreneurPulse.com.au  Fact Sheet for Healthcare Providers:  IncredibleEmployment.be  This test is no t yet approved or cleared by the [place] FDA and  has been authorized for detection and/or diagnosis of SARS-CoV-2 by FDA under an Emergency Use Authorization (EUA). This EUA will remain  in effect (meaning this test can be used) for the duration of the COVID-19 declaration under Section 564(b)(1) of the Act, 21 U.S.C.section 360bbb-3(b)(1), unless the authorization is terminated  or revoked sooner.       Influenza A by PCR NEGATIVE NEGATIVE Final   Influenza B by PCR NEGATIVE NEGATIVE Final    Comment: (NOTE) The Xpert Xpress SARS-CoV-2/FLU/RSV plus assay is intended as an aid in the diagnosis of influenza from Nasopharyngeal swab specimens and should not  be used as a sole basis for treatment. Nasal washings and aspirates are unacceptable for Xpert Xpress SARS-CoV-2/FLU/RSV testing.  Fact Sheet for Patients: EntrepreneurPulse.com.au  Fact Sheet for Healthcare Providers: IncredibleEmployment.be  This test is not yet approved or cleared by the [place] FDA and has been authorized for detection and/or diagnosis of SARS-CoV-2 by FDA under an Emergency Use Authorization (EUA). This EUA will remain in effect (meaning this test can be used) for the  duration of the COVID-19 declaration under Section 564(b)(1) of the Act, 21 U.S.C. section 360bbb-3(b)(1), unless the authorization is terminated or revoked.  Performed at [facility], [place] [address] [place], [place] [address]     Labs: CBC: Recent Labs  Lab 08/19/21 1739 08/19/21 1816 08/21/21 0820 08/22/21 0458 08/23/21 1155 08/23/21 1856 08/24/21 0242  WBC 13.5*  --  14.9* 15.7* 12.9*  --  14.0*  HGB 10.9*   < > 9.0* 9.6* 9.9* 9.5* 9.5*  HCT 33.2*   < > 29.0* 31.7* 31.8* 31.3* 30.4*  MCV 90.0  --  96.3 99.1 97.5  --  97.1  PLT 266  --  184 207 181  --  144*   < > = values in this interval not displayed.   Basic Metabolic Panel: Recent Labs  Lab 08/20/21 1922 08/21/21 0002 08/22/21 0755 08/23/21 0556 08/24/21 0242  NA 130* 130* 133* 134* 139  K 4.2 4.2 4.4 4.4 3.9  CL 102 102 107 109 112*  CO2 20* 22 16* 19* 21*  GLUCOSE 133* 101* 112* 122* 111*  BUN 37* 39* 32* 27* 28*  CREATININE 1.27* 1.28* 1.05* 0.82 0.97  CALCIUM 13.2* 12.2* 11.5* 12.7* 12.6*  PHOS  --   --  2.5 2.2* 2.8   Liver Function Tests: Recent Labs  Lab 08/19/21 1800 08/20/21 0023 08/22/21 0458 08/22/21 0755 08/23/21 0556 08/24/21 0242  AST 129* 114* 88*  --   --   --   ALT 87* 75* 63*  --   --   --   ALKPHOS 214* 192* 156*  --   --   --   BILITOT 3.0* 2.9* 2.1*  --   --   --   PROT 8.0 7.5 6.6  --   --   --   ALBUMIN 3.0* 2.8* 2.5* 2.6* 2.6* 2.4*   CBG: Recent Labs  Lab 08/19/21 1813  GLUCAP 90    Discharge time spent: greater than 30 minutes.  Signed: Elmarie Shiley, MD [facility] 08/24/2021

## 2021-08-24 NOTE — Progress Notes (Signed)
Mehreen Margaretha Glassing to be D/C'd  to residential hospice  per MD order.  Discussed with the patient and all questions fully answered.  IV catheter left intact per facility request.   An After Visit Summary was printed and given to the receiving facility. PTAR took prescription and DNR form to giving to [facility] place.   Patient escorted via stretcher, and D/C to [facility] via [place].  Manuella Ghazi 08/24/2021 4:16 PM

## 2021-08-24 NOTE — TOC Transition Note (Addendum)
Transition of Care [facility]) - CM/SW Discharge Note   Patient Details  Name: Christine Francis MRN: [id number] Date of Birth: 11/09/1960  [facility] CM/SW Contact:  Trish Mage, LCSW Phone Number: 08/24/2021, 10:25 AM   Clinical Narrative:   Per MD request made referral to [facility] for residential hospice,  Spoke with Melissa. TOC will continue to follow during the course of hospitalization.  Addendum: Received confirmation of bed at [facility].  PTAR arranged. Nursing, please call report to [phone]. TOC sign off.     Final next level of care: Hospice Medical Facility Barriers to Discharge: Other (must enter comment) (medical review, bed availability)   Patient Goals and CMS Choice        Discharge Placement                       Discharge Plan and Services                                     Social Determinants of Health (SDOH) Interventions     Readmission Risk Interventions No flowsheet data found.

## 2021-08-26 ENCOUNTER — Encounter: Payer: Self-pay | Admitting: Hematology and Oncology

## 2021-08-27 ENCOUNTER — Encounter (HOSPITAL_COMMUNITY): Payer: Self-pay

## 2021-08-28 ENCOUNTER — Inpatient Hospital Stay: Payer: 59

## 2021-08-28 ENCOUNTER — Inpatient Hospital Stay: Payer: 59 | Admitting: Nurse Practitioner

## 2021-08-28 ENCOUNTER — Inpatient Hospital Stay: Payer: 59 | Admitting: Hematology and Oncology

## 2021-08-30 LAB — PTH-RELATED PEPTIDE: PTH-related peptide: 15 pmol/L — ABNORMAL HIGH

## 2021-09-11 ENCOUNTER — Ambulatory Visit: Payer: 59

## 2021-09-11 DEATH — deceased

## 2021-09-23 ENCOUNTER — Ambulatory Visit: Payer: 59

## 2021-09-25 ENCOUNTER — Ambulatory Visit: Payer: 59

## 2021-10-16 ENCOUNTER — Ambulatory Visit: Payer: 59

## 2021-10-23 ENCOUNTER — Ambulatory Visit: Payer: 59

## 2021-11-13 ENCOUNTER — Ambulatory Visit: Payer: 59

## 2021-11-27 ENCOUNTER — Ambulatory Visit: Payer: 59

## 2021-11-27 ENCOUNTER — Ambulatory Visit: Payer: 59 | Admitting: Hematology and Oncology

## 2021-12-18 ENCOUNTER — Ambulatory Visit: Payer: 59

## 2023-01-21 IMAGING — US US ABDOMEN LIMITED
1 series · 4 of 4 positions shown · non-contrast
Comparison: Prior paracentesis 08/01/2021

CLINICAL DATA: 60-year-old female with ascites. Evaluate for
possible paracentesis.

EXAM:
LIMITED ABDOMEN ULTRASOUND FOR ASCITES
TECHNIQUE: Limited ultrasound survey for ascites was performed in all four
abdominal quadrants.

[Series 1: us paracentesis mc & wl · 4 of 4 slices shown]
[im 1/4]
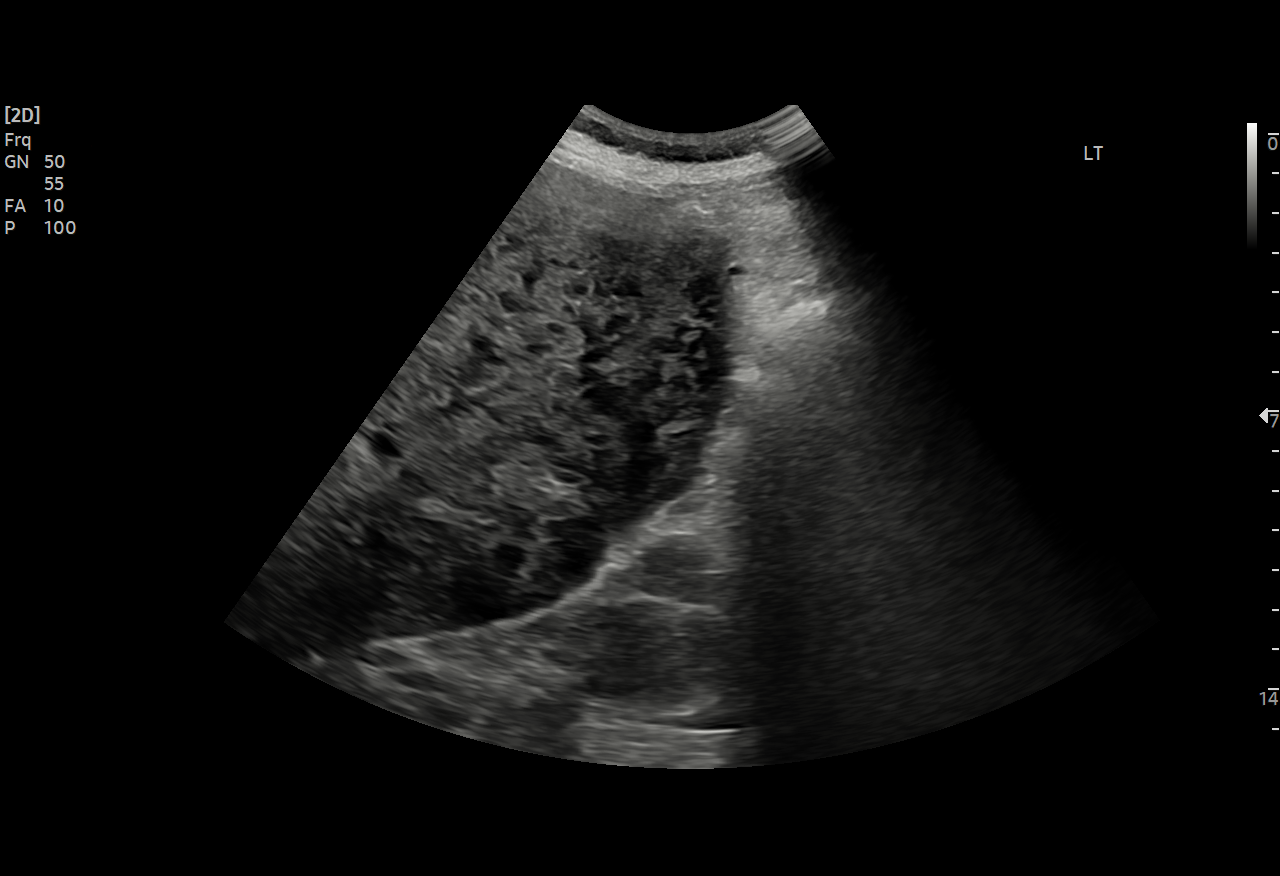
[im 2/4]
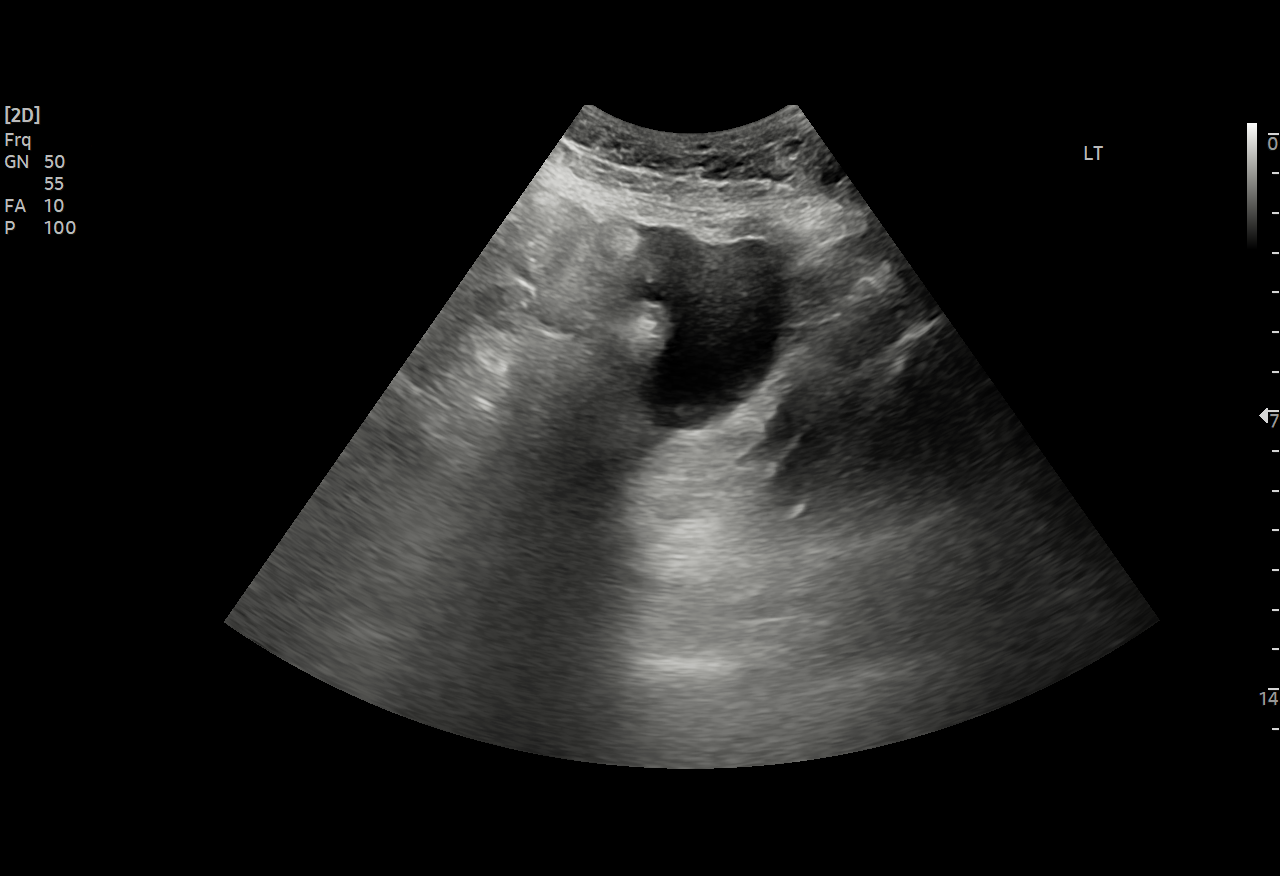
[im 3/4]
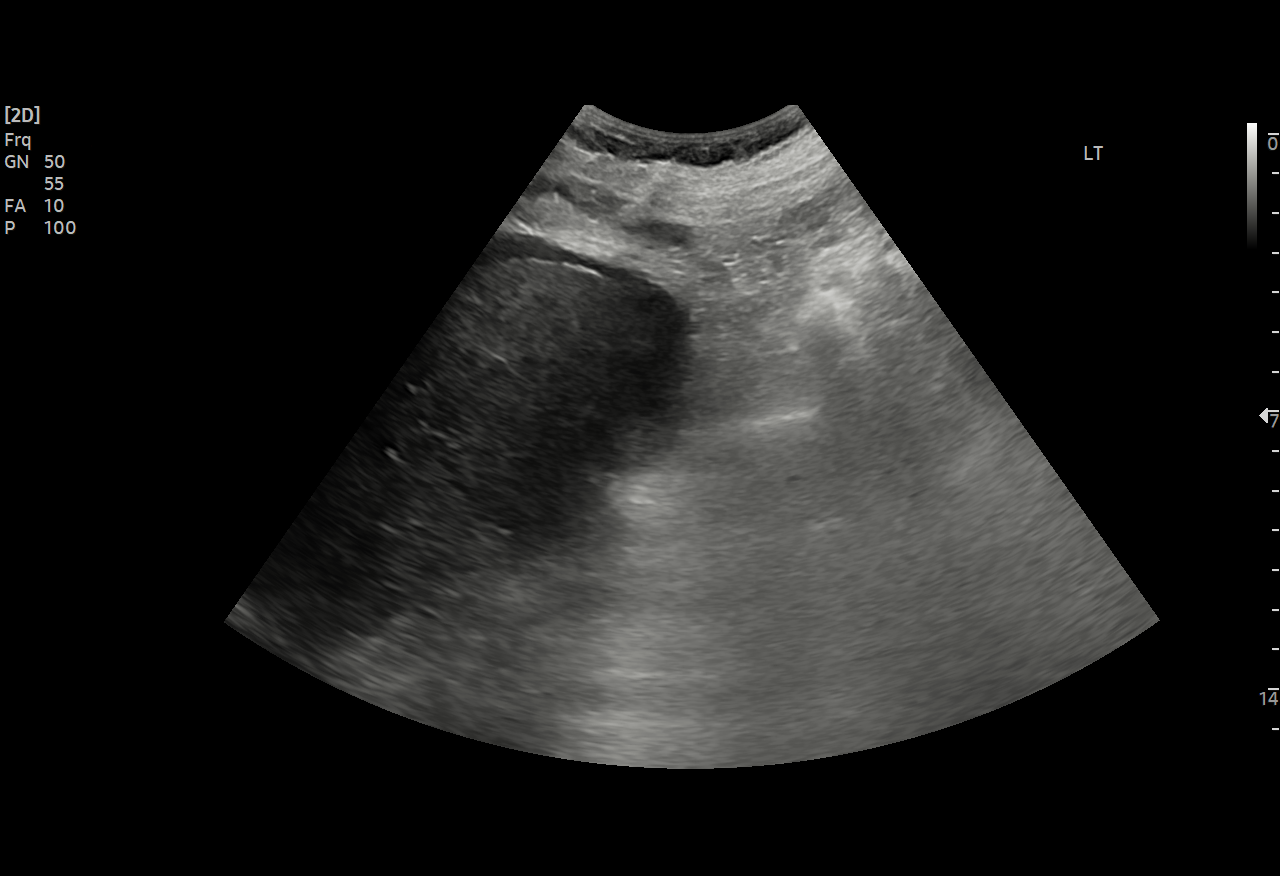
[im 4/4]
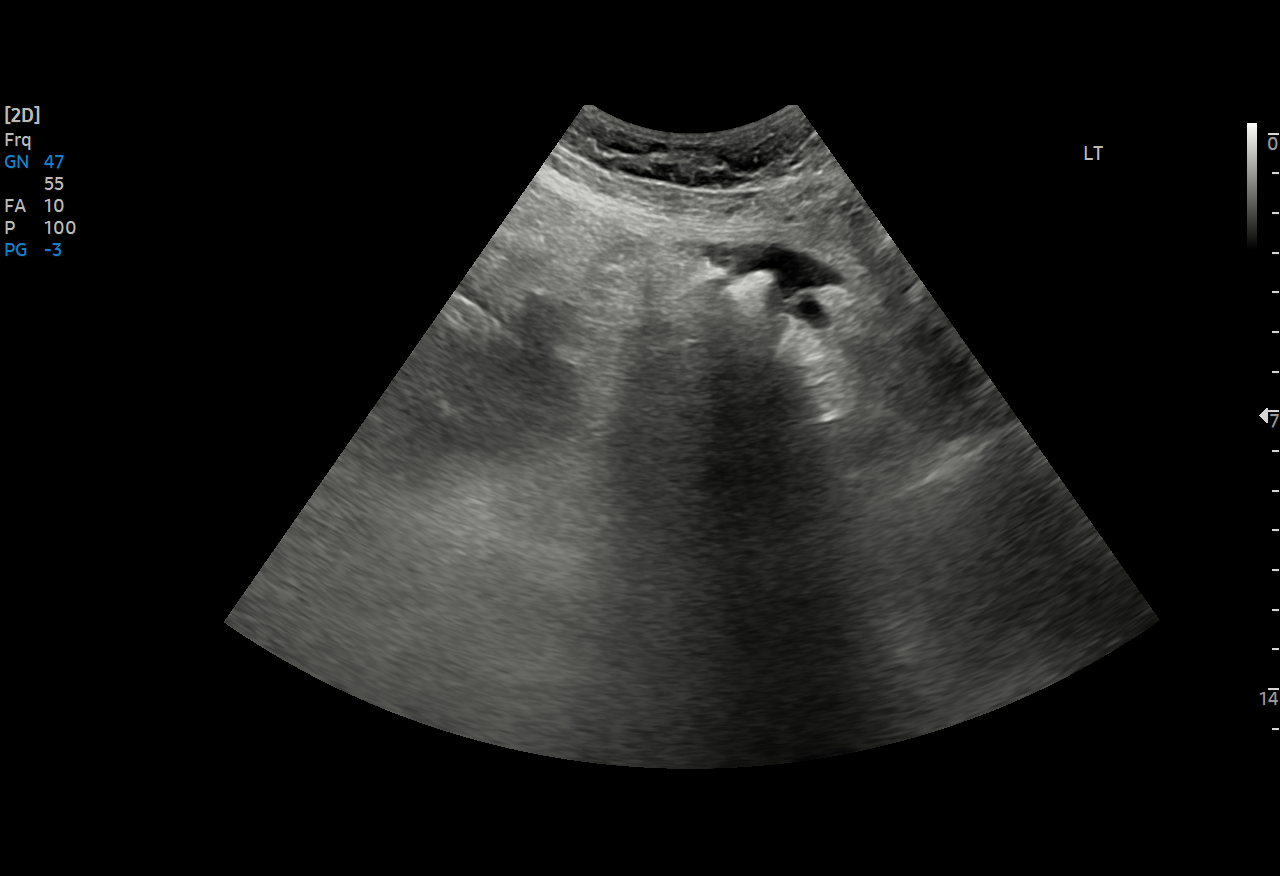

[4 of 4 positions shown; findings below may reference images not displayed]

FINDINGS: Sonographic interrogation of the 4 quadrants of the abdomen
demonstrates only trace ascites which is insufficient for
percutaneous drainage at this time. Paracentesis was deferred.
IMPRESSION: Trace ascites, insufficient for paracentesis.

## 2023-01-28 IMAGING — CT CT HEAD W/O CM
3 series · 15 of 47 positions shown, 18 images · non-contrast
Comparison: 07/22/2021.  Brain MR dated 07/31/2021.

CLINICAL DATA: Altered mental status. Stage IV breast cancer
metastases to the liver and left eye and orbit.



[Series 2: head wo · axial · 0.41mm/px · z∈[-142,-17]mm · 9 of 31 slices shown, 12 images]
[im 3/31  brain]
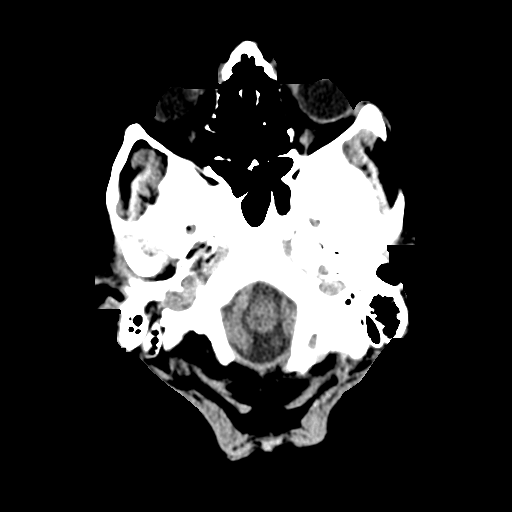
[im 3/31  bone]
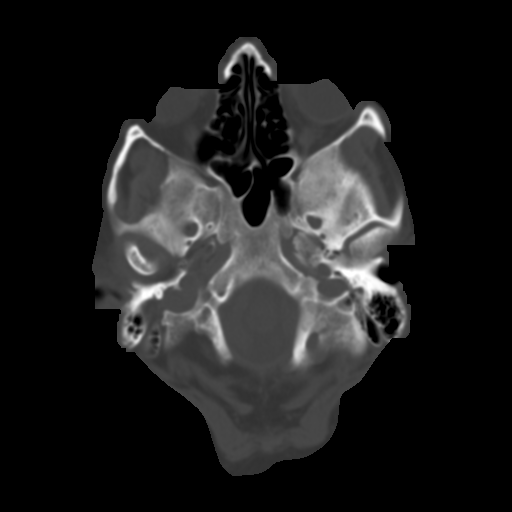
[im 6/31  brain]
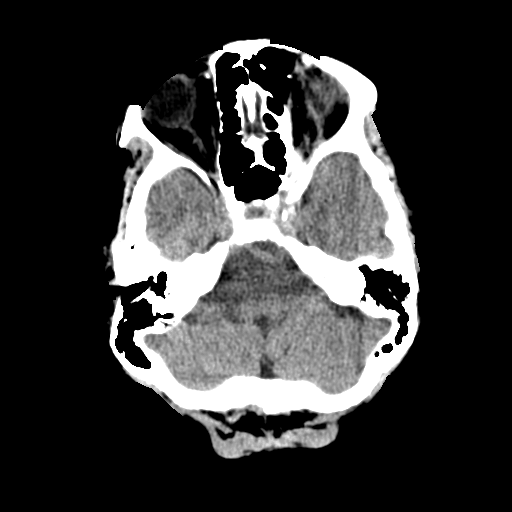
[im 9/31  brain]
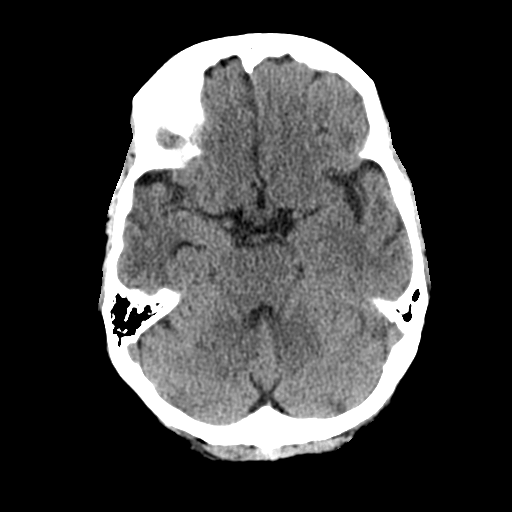
[im 12/31  brain]
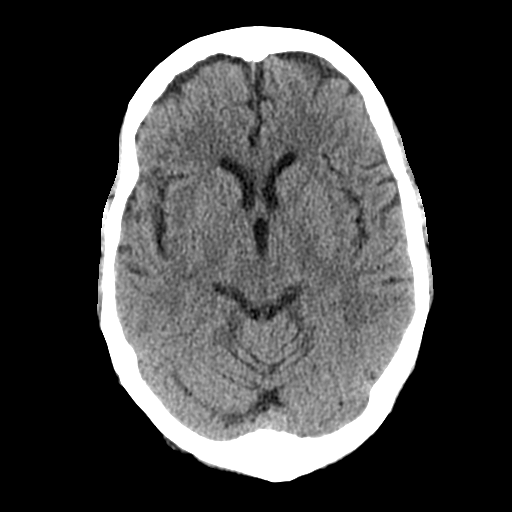
[im 16/31  brain]
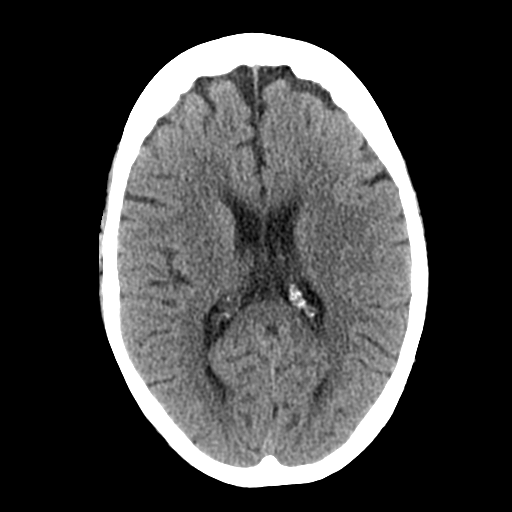
[im 16/31  bone]
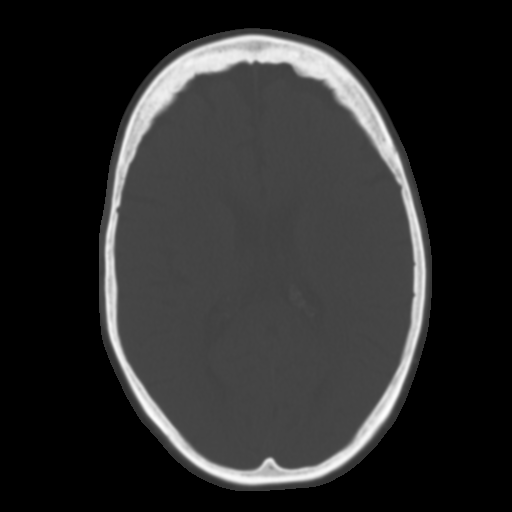
[im 19/31  brain]
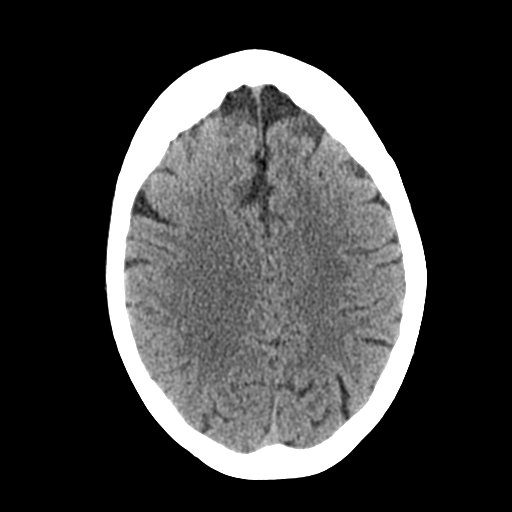
[im 22/31  brain]
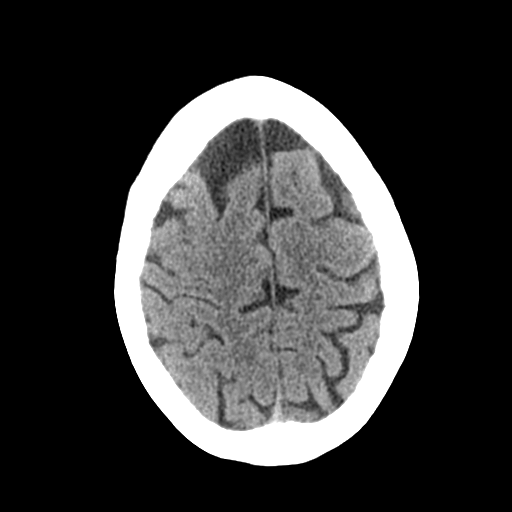
[im 25/31  brain]
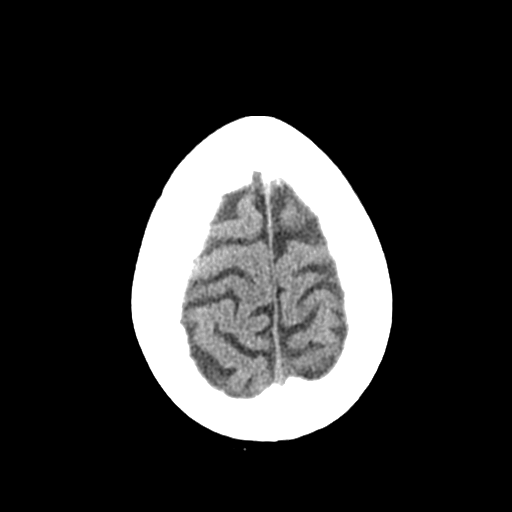
[im 28/31  brain]
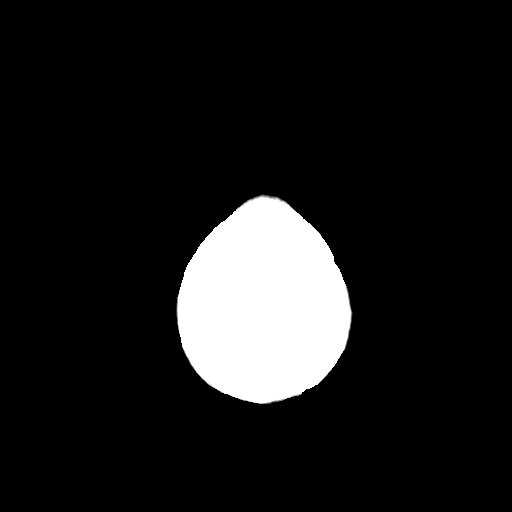
[im 28/31  bone]
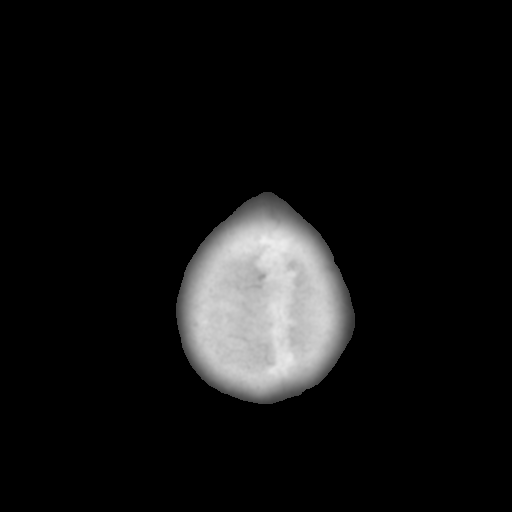

[Series 5: coronal soft tissue · coronal · 0.29mm/px · 3 of 67 slices shown]
[im 23/67  brain]
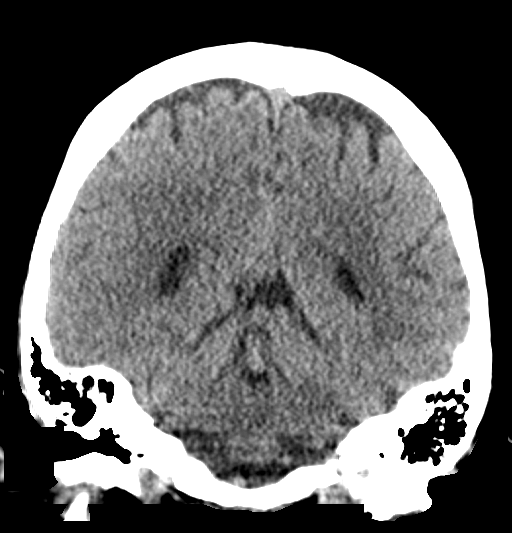
[im 30/67  brain]
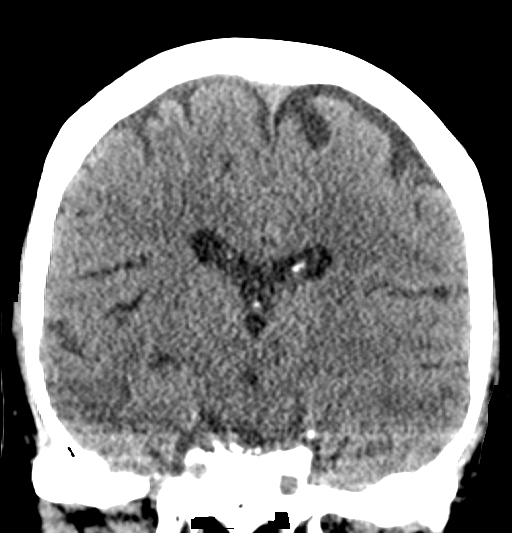
[im 37/67  brain]
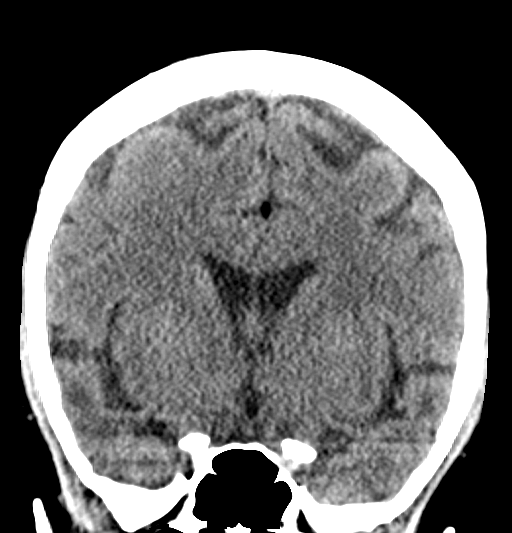

[Series 6: sagittal soft tissue · sagittal · 0.30mm/px · 3 of 50 slices shown]
[im 17/50  brain]
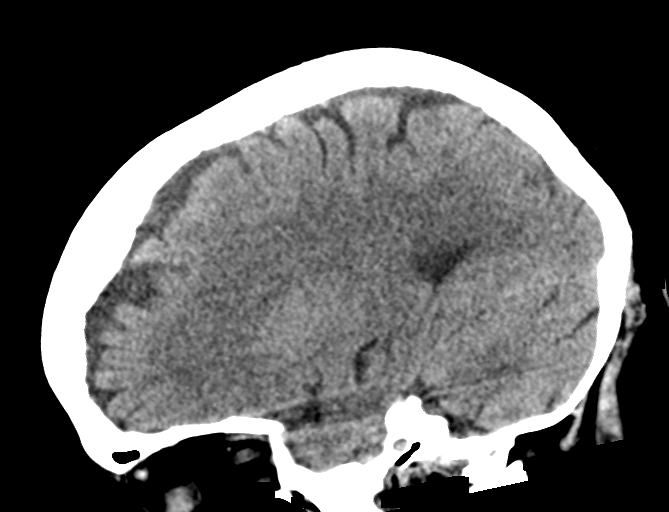
[im 25/50  brain]
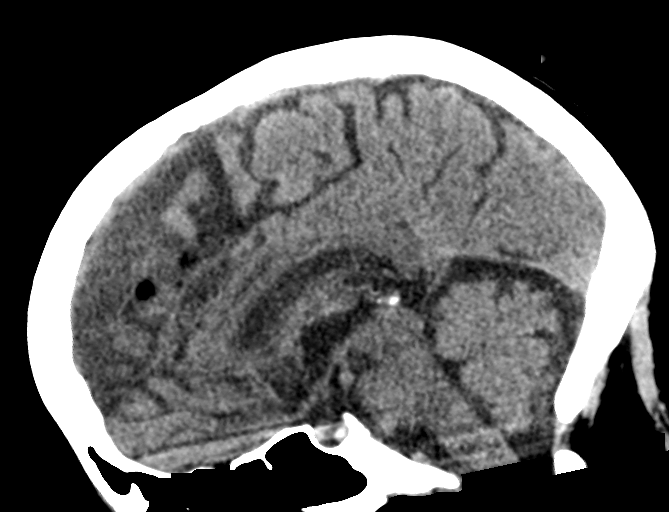
[im 33/50  brain]
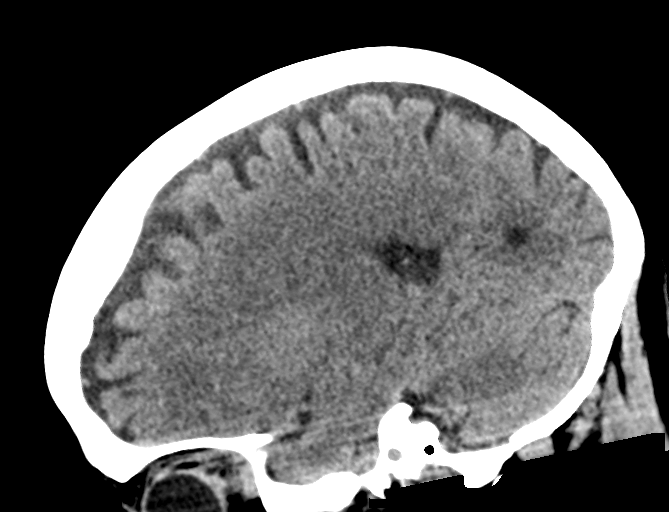

[15 of 47 positions shown; findings below may reference images not displayed]

FINDINGS: Brain: Normal appearing cerebral hemispheres and posterior fossa
structures. Normal size and position of the ventricles. No
intracranial hemorrhage, mass lesion or CT evidence of acute
infarction.

Vascular: No hyperdense vessel or unexpected calcification.

Skull: Normal. Negative for fracture or focal lesion.

Sinuses/Orbits: The left orbital mass seen on the recent MRI is
again demonstrated. Unremarkable right orbit and paranasal sinuses.

Other: Bilateral concha bullosa.
IMPRESSION: 1. No acute abnormality.
2. Stable left orbital mass concerning for a metastasis.

## 2023-01-28 IMAGING — CR DG CHEST 2V
2 series · 2 of 2 positions shown · non-contrast
Comparison: 07/22/2021

CLINICAL DATA: Metastatic breast cancer, weakness, confusion,
altered level of consciousness

EXAM:
CHEST - 2 VIEW

[w chest lat]
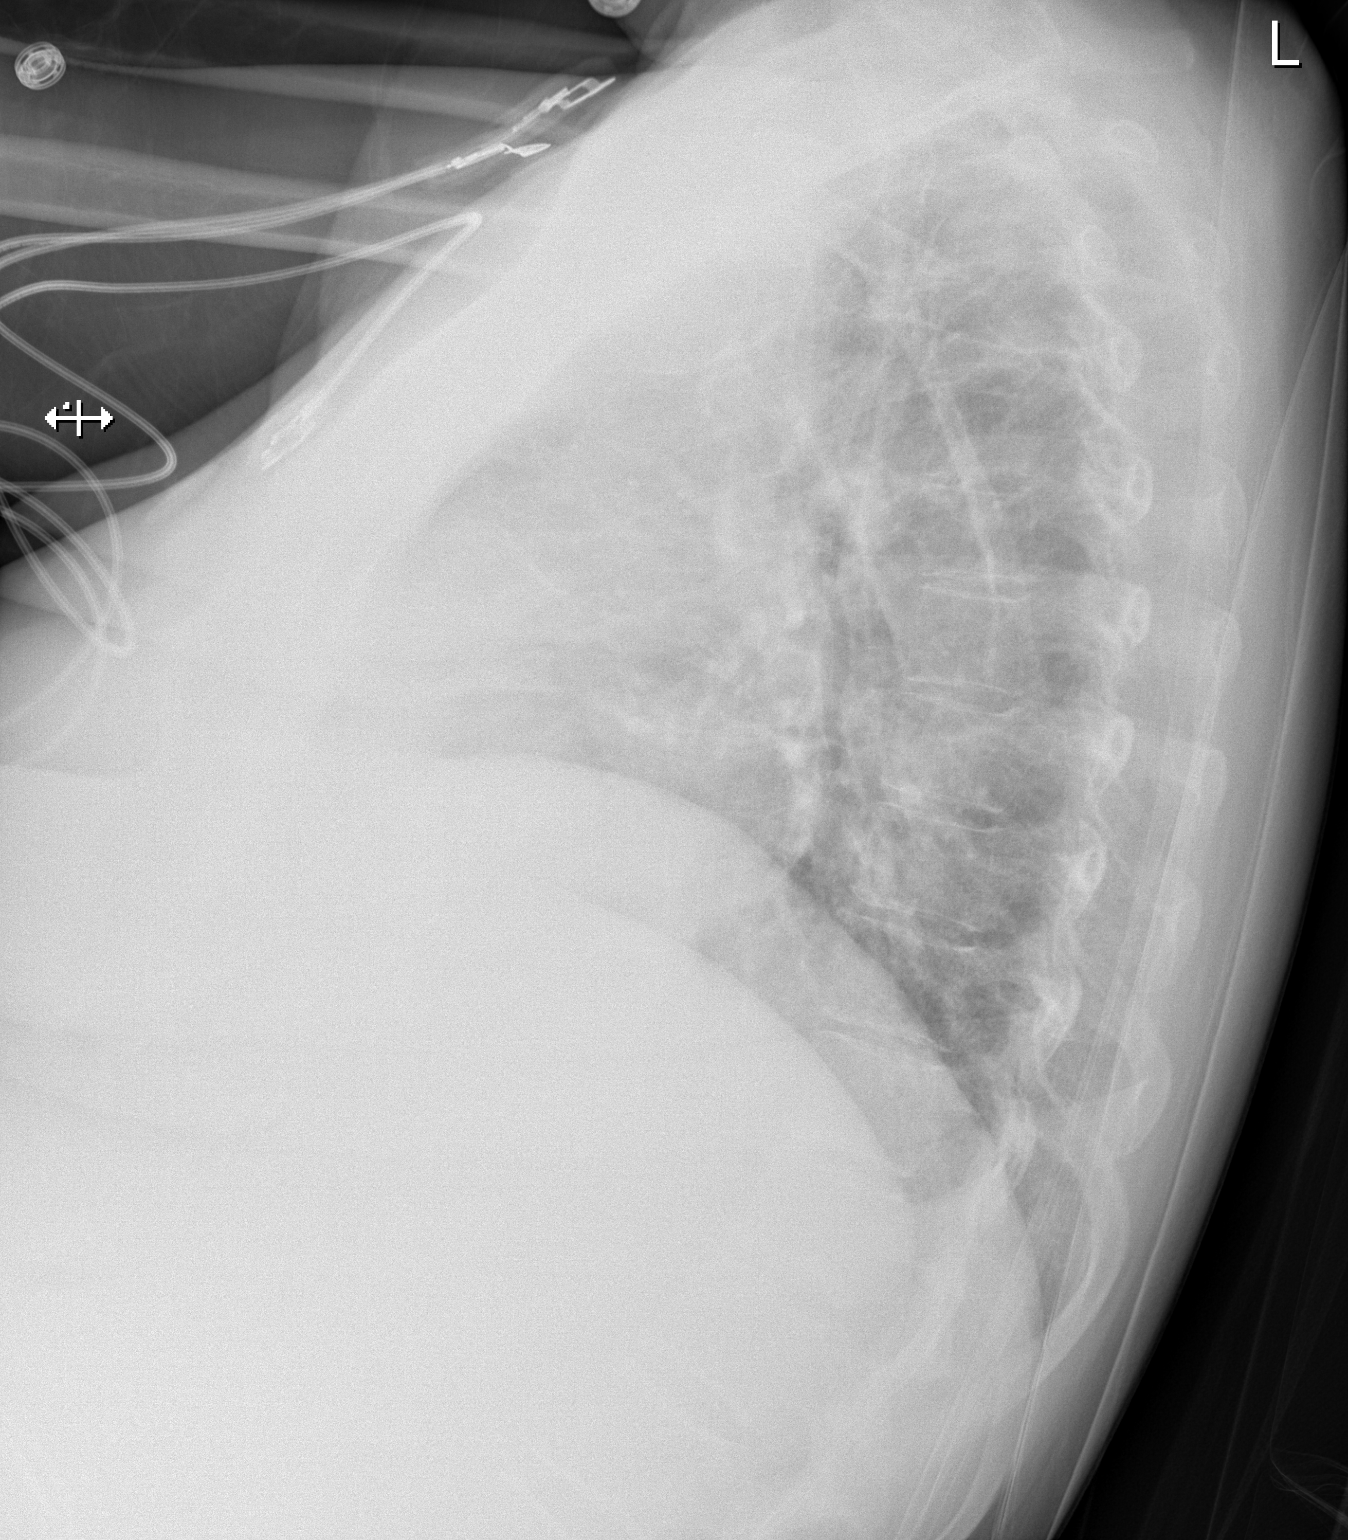

[x chest ap]
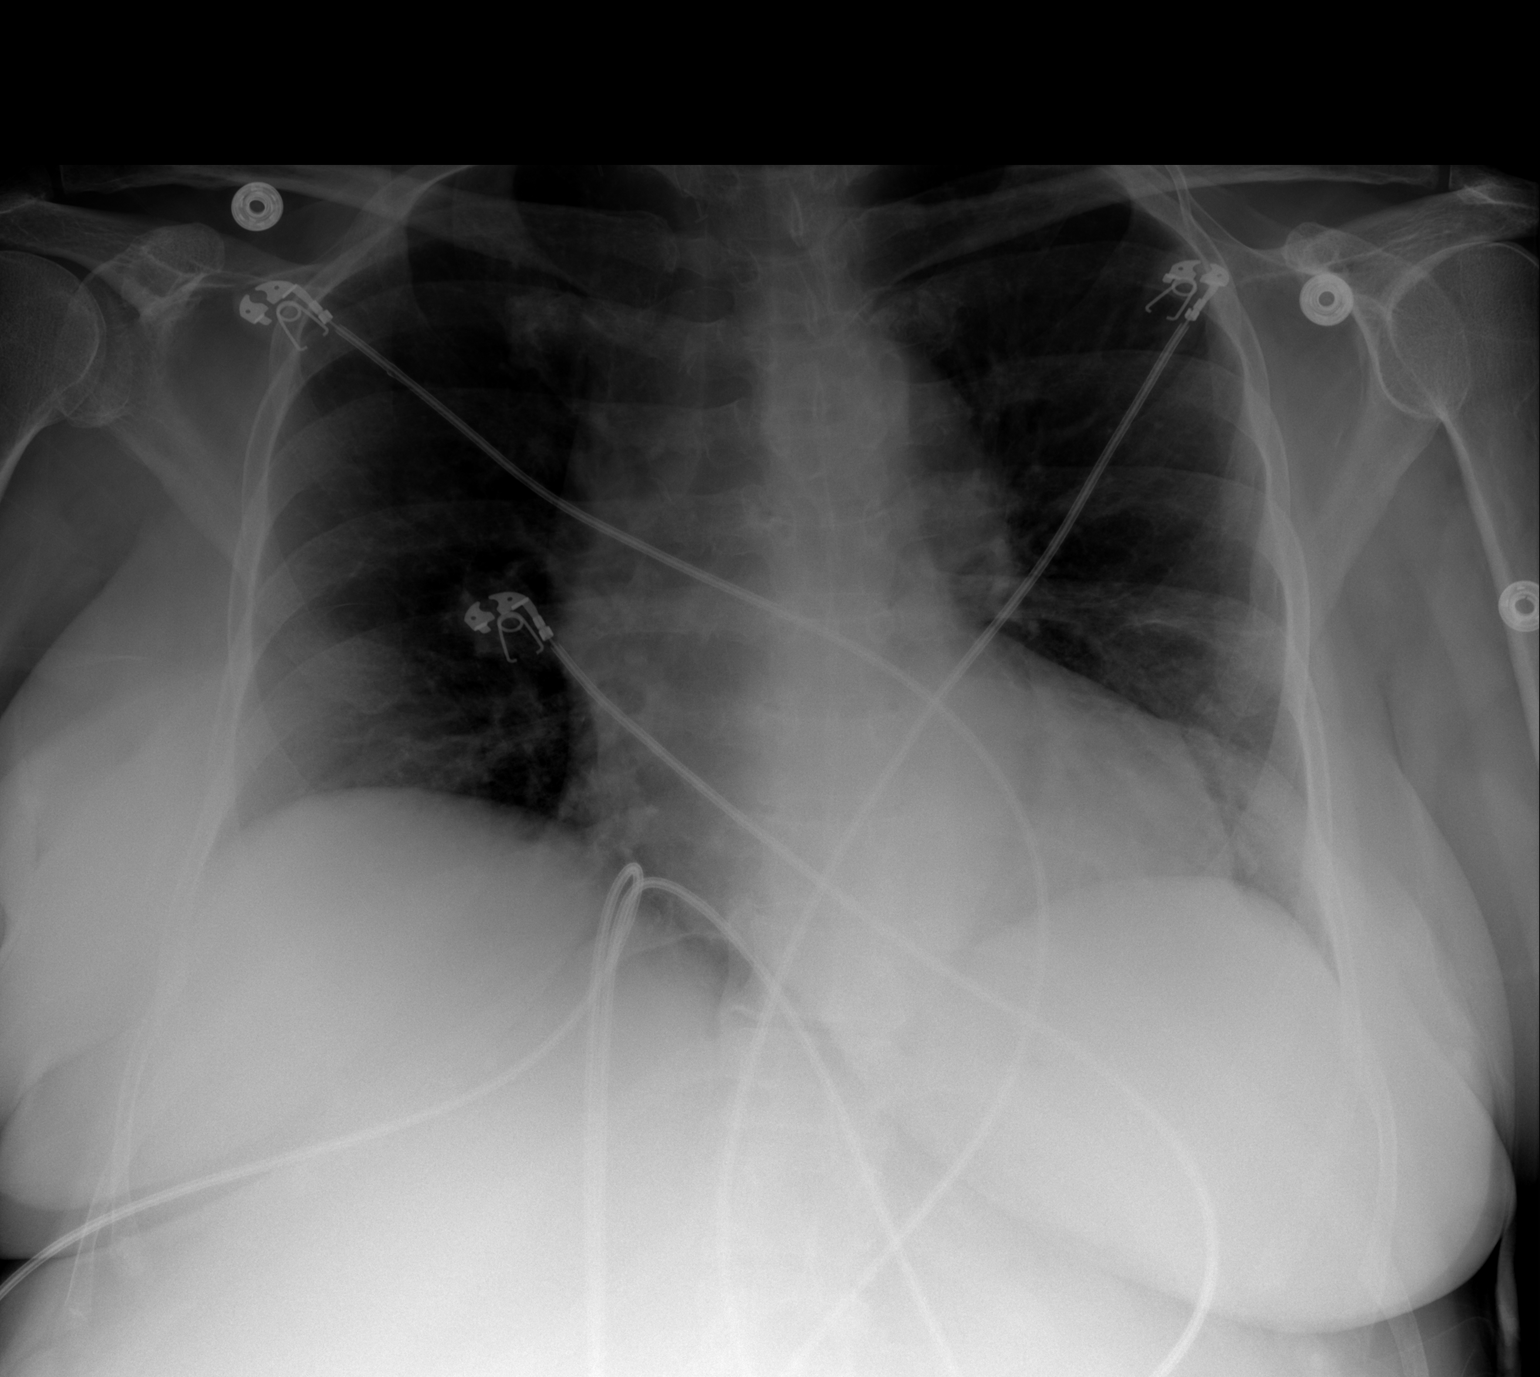

[2 of 2 positions shown; findings below may reference images not displayed]

FINDINGS: Frontal and lateral views of the chest demonstrate a stable cardiac
silhouette. No acute airspace disease, effusion, or pneumothorax. No
acute displaced fracture.
IMPRESSION: 1. No acute intrathoracic process.
